# Patient Record
Sex: Male | Born: 1950
Health system: Southern US, Community
[De-identification: ages and names within clinical notes are randomized; demographics above are authoritative.]

## PROBLEM LIST (undated history)

## (undated) DIAGNOSIS — Z87442 Personal history of urinary calculi: Secondary | ICD-10-CM

## (undated) DIAGNOSIS — M1711 Unilateral primary osteoarthritis, right knee: Secondary | ICD-10-CM

## (undated) DIAGNOSIS — M1990 Unspecified osteoarthritis, unspecified site: Secondary | ICD-10-CM

## (undated) DIAGNOSIS — D539 Nutritional anemia, unspecified: Secondary | ICD-10-CM

## (undated) DIAGNOSIS — G250 Essential tremor: Secondary | ICD-10-CM

## (undated) DIAGNOSIS — Z87440 Personal history of urinary (tract) infections: Secondary | ICD-10-CM

## (undated) DIAGNOSIS — N393 Stress incontinence (female) (male): Secondary | ICD-10-CM

## (undated) DIAGNOSIS — E785 Hyperlipidemia, unspecified: Secondary | ICD-10-CM

## (undated) DIAGNOSIS — M12811 Other specific arthropathies, not elsewhere classified, right shoulder: Secondary | ICD-10-CM

## (undated) DIAGNOSIS — I639 Cerebral infarction, unspecified: Secondary | ICD-10-CM

## (undated) DIAGNOSIS — Z9289 Personal history of other medical treatment: Secondary | ICD-10-CM

## (undated) DIAGNOSIS — E538 Deficiency of other specified B group vitamins: Secondary | ICD-10-CM

## (undated) DIAGNOSIS — R0789 Other chest pain: Secondary | ICD-10-CM

## (undated) DIAGNOSIS — D472 Monoclonal gammopathy: Secondary | ICD-10-CM

## (undated) DIAGNOSIS — C61 Malignant neoplasm of prostate: Secondary | ICD-10-CM

## (undated) DIAGNOSIS — R55 Syncope and collapse: Secondary | ICD-10-CM

## (undated) DIAGNOSIS — A419 Sepsis, unspecified organism: Secondary | ICD-10-CM

## (undated) DIAGNOSIS — R7302 Impaired glucose tolerance (oral): Secondary | ICD-10-CM

## (undated) DIAGNOSIS — K5790 Diverticulosis of intestine, part unspecified, without perforation or abscess without bleeding: Secondary | ICD-10-CM

## (undated) DIAGNOSIS — N179 Acute kidney failure, unspecified: Secondary | ICD-10-CM

## (undated) DIAGNOSIS — F329 Major depressive disorder, single episode, unspecified: Secondary | ICD-10-CM

## (undated) DIAGNOSIS — M75101 Unspecified rotator cuff tear or rupture of right shoulder, not specified as traumatic: Secondary | ICD-10-CM

## (undated) DIAGNOSIS — I10 Essential (primary) hypertension: Secondary | ICD-10-CM

## (undated) DIAGNOSIS — K4090 Unilateral inguinal hernia, without obstruction or gangrene, not specified as recurrent: Secondary | ICD-10-CM

## (undated) DIAGNOSIS — I619 Nontraumatic intracerebral hemorrhage, unspecified: Principal | ICD-10-CM

## (undated) DIAGNOSIS — K219 Gastro-esophageal reflux disease without esophagitis: Secondary | ICD-10-CM

## (undated) HISTORY — DX: Syncope and collapse: R55

## (undated) HISTORY — DX: Nutritional anemia, unspecified: D53.9

## (undated) HISTORY — DX: Cerebral infarction, unspecified: I63.9

## (undated) HISTORY — DX: Unilateral primary osteoarthritis, right knee: M17.11

## (undated) HISTORY — DX: Malignant neoplasm of prostate: C61

## (undated) HISTORY — DX: Hyperlipidemia, unspecified: E78.5

## (undated) HISTORY — PX: COLONOSCOPY: SHX174

## (undated) HISTORY — PX: SPINE SURGERY: SHX786

## (undated) HISTORY — DX: Monoclonal gammopathy: D47.2

## (undated) HISTORY — DX: Gastro-esophageal reflux disease without esophagitis: K21.9

## (undated) HISTORY — DX: Nontraumatic intracerebral hemorrhage, unspecified: I61.9

## (undated) HISTORY — PX: WRIST SURGERY: SHX841

## (undated) HISTORY — DX: Diverticulosis of intestine, part unspecified, without perforation or abscess without bleeding: K57.90

## (undated) HISTORY — DX: Unilateral inguinal hernia, without obstruction or gangrene, not specified as recurrent: K40.90

## (undated) HISTORY — DX: Major depressive disorder, single episode, unspecified: F32.9

## (undated) HISTORY — DX: Impaired glucose tolerance (oral): R73.02

## (undated) HISTORY — DX: Essential tremor: G25.0

## (undated) HISTORY — DX: Unspecified osteoarthritis, unspecified site: M19.90

---

## 1997-07-16 ENCOUNTER — Encounter: Admission: RE | Admit: 1997-07-16 | Discharge: 1997-10-14 | Payer: Self-pay | Admitting: Orthopedic Surgery

## 1999-04-16 ENCOUNTER — Encounter: Payer: Self-pay | Admitting: Internal Medicine

## 1999-04-16 ENCOUNTER — Ambulatory Visit (HOSPITAL_COMMUNITY): Admission: RE | Admit: 1999-04-16 | Discharge: 1999-04-16 | Payer: Self-pay | Admitting: Internal Medicine

## 2000-04-26 ENCOUNTER — Ambulatory Visit (HOSPITAL_COMMUNITY): Admission: RE | Admit: 2000-04-26 | Discharge: 2000-04-26 | Payer: Self-pay | Admitting: *Deleted

## 2000-04-26 ENCOUNTER — Encounter: Payer: Self-pay | Admitting: *Deleted

## 2000-06-23 ENCOUNTER — Encounter: Payer: Self-pay | Admitting: *Deleted

## 2000-06-23 ENCOUNTER — Ambulatory Visit (HOSPITAL_COMMUNITY): Admission: RE | Admit: 2000-06-23 | Discharge: 2000-06-23 | Payer: Self-pay | Admitting: *Deleted

## 2001-02-16 ENCOUNTER — Encounter (HOSPITAL_COMMUNITY): Admission: RE | Admit: 2001-02-16 | Discharge: 2001-05-17 | Payer: Self-pay | Admitting: Orthopedic Surgery

## 2001-02-17 ENCOUNTER — Emergency Department (HOSPITAL_COMMUNITY): Admission: EM | Admit: 2001-02-17 | Discharge: 2001-02-17 | Payer: Self-pay

## 2001-02-18 ENCOUNTER — Emergency Department (HOSPITAL_COMMUNITY): Admission: EM | Admit: 2001-02-18 | Discharge: 2001-02-18 | Payer: Self-pay

## 2001-02-19 ENCOUNTER — Ambulatory Visit (HOSPITAL_COMMUNITY): Admission: RE | Admit: 2001-02-19 | Discharge: 2001-02-19 | Payer: Self-pay | Admitting: Orthopedic Surgery

## 2001-08-13 ENCOUNTER — Ambulatory Visit (HOSPITAL_COMMUNITY): Admission: RE | Admit: 2001-08-13 | Discharge: 2001-08-14 | Payer: Self-pay | Admitting: Neurosurgery

## 2001-08-13 ENCOUNTER — Encounter: Payer: Self-pay | Admitting: Neurosurgery

## 2001-09-25 ENCOUNTER — Encounter: Admission: RE | Admit: 2001-09-25 | Discharge: 2001-09-25 | Payer: Self-pay | Admitting: Neurosurgery

## 2001-09-25 ENCOUNTER — Encounter: Payer: Self-pay | Admitting: Neurosurgery

## 2002-01-21 ENCOUNTER — Ambulatory Visit (HOSPITAL_COMMUNITY): Admission: RE | Admit: 2002-01-21 | Discharge: 2002-01-21 | Payer: Self-pay | Admitting: Neurosurgery

## 2002-01-21 ENCOUNTER — Encounter: Payer: Self-pay | Admitting: Neurosurgery

## 2002-06-26 ENCOUNTER — Encounter: Payer: Self-pay | Admitting: Gastroenterology

## 2002-06-26 ENCOUNTER — Ambulatory Visit (HOSPITAL_COMMUNITY): Admission: RE | Admit: 2002-06-26 | Discharge: 2002-06-26 | Payer: Self-pay | Admitting: Gastroenterology

## 2002-07-18 ENCOUNTER — Encounter: Admission: RE | Admit: 2002-07-18 | Discharge: 2002-07-18 | Payer: Self-pay | Admitting: Neurosurgery

## 2002-07-18 ENCOUNTER — Encounter: Payer: Self-pay | Admitting: Neurosurgery

## 2002-08-13 ENCOUNTER — Ambulatory Visit (HOSPITAL_COMMUNITY): Admission: RE | Admit: 2002-08-13 | Discharge: 2002-08-13 | Payer: Self-pay | Admitting: Neurosurgery

## 2002-08-13 ENCOUNTER — Encounter: Payer: Self-pay | Admitting: Neurosurgery

## 2005-06-07 ENCOUNTER — Ambulatory Visit: Payer: Self-pay | Admitting: Internal Medicine

## 2005-07-05 ENCOUNTER — Ambulatory Visit: Payer: Self-pay | Admitting: Gastroenterology

## 2005-07-20 ENCOUNTER — Ambulatory Visit: Payer: Self-pay | Admitting: Gastroenterology

## 2006-03-29 ENCOUNTER — Ambulatory Visit: Payer: Self-pay | Admitting: Internal Medicine

## 2006-05-11 ENCOUNTER — Ambulatory Visit: Payer: Self-pay | Admitting: Internal Medicine

## 2006-05-18 ENCOUNTER — Encounter: Admission: RE | Admit: 2006-05-18 | Discharge: 2006-05-18 | Payer: Self-pay | Admitting: Internal Medicine

## 2008-08-26 ENCOUNTER — Ambulatory Visit: Payer: Self-pay | Admitting: Internal Medicine

## 2008-08-26 DIAGNOSIS — E739 Lactose intolerance, unspecified: Secondary | ICD-10-CM | POA: Insufficient documentation

## 2008-08-26 DIAGNOSIS — M545 Low back pain, unspecified: Secondary | ICD-10-CM | POA: Insufficient documentation

## 2008-08-26 DIAGNOSIS — K5730 Diverticulosis of large intestine without perforation or abscess without bleeding: Secondary | ICD-10-CM | POA: Insufficient documentation

## 2008-08-26 DIAGNOSIS — F528 Other sexual dysfunction not due to a substance or known physiological condition: Secondary | ICD-10-CM

## 2008-08-26 DIAGNOSIS — R21 Rash and other nonspecific skin eruption: Secondary | ICD-10-CM

## 2008-08-26 DIAGNOSIS — E785 Hyperlipidemia, unspecified: Secondary | ICD-10-CM | POA: Insufficient documentation

## 2008-08-26 DIAGNOSIS — K219 Gastro-esophageal reflux disease without esophagitis: Secondary | ICD-10-CM

## 2008-08-26 DIAGNOSIS — J309 Allergic rhinitis, unspecified: Secondary | ICD-10-CM | POA: Insufficient documentation

## 2008-08-26 DIAGNOSIS — R972 Elevated prostate specific antigen [PSA]: Secondary | ICD-10-CM | POA: Insufficient documentation

## 2008-09-03 ENCOUNTER — Ambulatory Visit: Payer: Self-pay | Admitting: Internal Medicine

## 2008-09-05 ENCOUNTER — Encounter: Payer: Self-pay | Admitting: Internal Medicine

## 2008-09-05 LAB — CONVERTED CEMR LAB: Hgb A1c MFr Bld: 6 % (ref 4.6–6.5)

## 2008-09-09 ENCOUNTER — Encounter: Payer: Self-pay | Admitting: Internal Medicine

## 2008-09-09 LAB — CONVERTED CEMR LAB
ALT: 33 units/L (ref 0–53)
AST: 24 units/L (ref 0–37)
Albumin: 4.4 g/dL (ref 3.5–5.2)
Alkaline Phosphatase: 52 units/L (ref 39–117)
BUN: 13 mg/dL (ref 6–23)
Basophils Absolute: 0 10*3/uL (ref 0.0–0.1)
Basophils Relative: 0.2 % (ref 0.0–3.0)
Bilirubin Urine: NEGATIVE
Bilirubin, Direct: 0.2 mg/dL (ref 0.0–0.3)
CO2: 32 meq/L (ref 19–32)
Calcium: 9.5 mg/dL (ref 8.4–10.5)
Chloride: 108 meq/L (ref 96–112)
Cholesterol: 224 mg/dL — ABNORMAL HIGH (ref 0–200)
Creatinine, Ser: 1.1 mg/dL (ref 0.4–1.5)
Direct LDL: 154.4 mg/dL
Eosinophils Absolute: 0 10*3/uL (ref 0.0–0.7)
Eosinophils Relative: 0.5 % (ref 0.0–5.0)
GFR calc non Af Amer: 88.19 mL/min (ref >60)
Glucose, Bld: 150 mg/dL — ABNORMAL HIGH (ref 70–99)
HCT: 43 % (ref 39.0–52.0)
HDL: 68.7 mg/dL (ref >39.00)
Hemoglobin, Urine: NEGATIVE
Hemoglobin: 15.1 g/dL (ref 13.0–17.0)
Ketones, ur: NEGATIVE mg/dL
Leukocytes, UA: NEGATIVE
Lymphocytes Relative: 19 % (ref 12.0–46.0)
Lymphs Abs: 1.5 10*3/uL (ref 0.7–4.0)
MCHC: 35.1 g/dL (ref 30.0–36.0)
MCV: 101.7 fL — ABNORMAL HIGH (ref 78.0–100.0)
Monocytes Absolute: 0.4 10*3/uL (ref 0.1–1.0)
Monocytes Relative: 5.4 % (ref 3.0–12.0)
Neutro Abs: 5.8 10*3/uL (ref 1.4–7.7)
Neutrophils Relative %: 74.9 % (ref 43.0–77.0)
Nitrite: NEGATIVE
PSA: 6.12 ng/mL — ABNORMAL HIGH (ref 0.10–4.00)
Platelets: 315 10*3/uL (ref 150.0–400.0)
Potassium: 3.9 meq/L (ref 3.5–5.1)
RBC: 4.23 M/uL (ref 4.22–5.81)
RDW: 12.6 % (ref 11.5–14.6)
Sodium: 143 meq/L (ref 135–145)
Specific Gravity, Urine: 1.025 (ref 1.000–1.030)
TSH: 1.45 microintl units/mL (ref 0.35–5.50)
Total Bilirubin: 1 mg/dL (ref 0.3–1.2)
Total CHOL/HDL Ratio: 3
Total Protein: 8 g/dL (ref 6.0–8.3)
Triglycerides: 67 mg/dL (ref 0.0–149.0)
Urine Glucose: NEGATIVE mg/dL
Urobilinogen, UA: 0.2 (ref 0.0–1.0)
VLDL: 13.4 mg/dL (ref 0.0–40.0)
WBC: 7.7 10*3/uL (ref 4.5–10.5)
pH: 6 (ref 5.0–8.0)

## 2008-10-07 ENCOUNTER — Telehealth: Payer: Self-pay | Admitting: Internal Medicine

## 2008-10-07 ENCOUNTER — Ambulatory Visit: Payer: Self-pay | Admitting: Gastroenterology

## 2008-10-21 ENCOUNTER — Ambulatory Visit: Payer: Self-pay | Admitting: Gastroenterology

## 2008-11-21 ENCOUNTER — Telehealth: Payer: Self-pay | Admitting: Internal Medicine

## 2008-12-29 ENCOUNTER — Ambulatory Visit: Payer: Self-pay | Admitting: Internal Medicine

## 2008-12-29 DIAGNOSIS — M25512 Pain in left shoulder: Secondary | ICD-10-CM

## 2009-01-05 ENCOUNTER — Encounter: Payer: Self-pay | Admitting: Internal Medicine

## 2009-04-11 DIAGNOSIS — C61 Malignant neoplasm of prostate: Secondary | ICD-10-CM

## 2009-04-11 HISTORY — DX: Malignant neoplasm of prostate: C61

## 2009-10-23 ENCOUNTER — Telehealth: Payer: Self-pay | Admitting: Internal Medicine

## 2009-11-09 ENCOUNTER — Ambulatory Visit: Payer: Self-pay | Admitting: Internal Medicine

## 2009-11-09 LAB — CONVERTED CEMR LAB
ALT: 29 units/L (ref 0–53)
AST: 25 units/L (ref 0–37)
Alkaline Phosphatase: 48 units/L (ref 39–117)
BUN: 16 mg/dL (ref 6–23)
Bilirubin Urine: NEGATIVE
Calcium: 9.2 mg/dL (ref 8.4–10.5)
Eosinophils Absolute: 0.2 10*3/uL (ref 0.0–0.7)
Eosinophils Relative: 3.3 % (ref 0.0–5.0)
GFR calc non Af Amer: 81.8 mL/min (ref >60)
Glucose, Bld: 108 mg/dL — ABNORMAL HIGH (ref 70–99)
HCT: 42.5 % (ref 39.0–52.0)
Ketones, ur: NEGATIVE mg/dL
Lymphs Abs: 2.3 10*3/uL (ref 0.7–4.0)
MCHC: 35.5 g/dL (ref 30.0–36.0)
MCV: 101.7 fL — ABNORMAL HIGH (ref 78.0–100.0)
Monocytes Absolute: 0.5 10*3/uL (ref 0.1–1.0)
Neutrophils Relative %: 59.3 % (ref 43.0–77.0)
Platelets: 281 10*3/uL (ref 150.0–400.0)
Potassium: 4.3 meq/L (ref 3.5–5.1)
Sodium: 140 meq/L (ref 135–145)
TSH: 2.21 microintl units/mL (ref 0.35–5.50)
Total Bilirubin: 1.4 mg/dL — ABNORMAL HIGH (ref 0.3–1.2)
Total CHOL/HDL Ratio: 3
Total Protein, Urine: NEGATIVE mg/dL
Triglycerides: 60 mg/dL (ref 0.0–149.0)
WBC: 7.5 10*3/uL (ref 4.5–10.5)
pH: 6 (ref 5.0–8.0)

## 2009-11-12 ENCOUNTER — Encounter: Payer: Self-pay | Admitting: Internal Medicine

## 2009-11-12 ENCOUNTER — Ambulatory Visit: Payer: Self-pay | Admitting: Internal Medicine

## 2010-01-19 ENCOUNTER — Encounter: Payer: Self-pay | Admitting: Internal Medicine

## 2010-02-10 ENCOUNTER — Encounter: Payer: Self-pay | Admitting: Internal Medicine

## 2010-02-24 ENCOUNTER — Encounter: Payer: Self-pay | Admitting: Internal Medicine

## 2010-03-10 ENCOUNTER — Encounter: Payer: Self-pay | Admitting: Internal Medicine

## 2010-03-17 ENCOUNTER — Encounter: Payer: Self-pay | Admitting: Internal Medicine

## 2010-03-30 ENCOUNTER — Encounter: Payer: Self-pay | Admitting: Internal Medicine

## 2010-04-11 DIAGNOSIS — R55 Syncope and collapse: Secondary | ICD-10-CM

## 2010-04-11 HISTORY — DX: Syncope and collapse: R55

## 2010-04-11 HISTORY — PX: ROBOT ASSISTED LAPAROSCOPIC RADICAL PROSTATECTOMY: SHX5141

## 2010-04-26 ENCOUNTER — Inpatient Hospital Stay (HOSPITAL_COMMUNITY)
Admission: RE | Admit: 2010-04-26 | Discharge: 2010-04-27 | Payer: Self-pay | Source: Home / Self Care | Attending: Urology | Admitting: Urology

## 2010-04-26 ENCOUNTER — Encounter (INDEPENDENT_AMBULATORY_CARE_PROVIDER_SITE_OTHER): Payer: Self-pay | Admitting: Urology

## 2010-04-26 LAB — CBC
HCT: 41.9 % (ref 39.0–52.0)
Hemoglobin: 14.1 g/dL (ref 13.0–17.0)
MCH: 34.5 pg — ABNORMAL HIGH (ref 26.0–34.0)
MCHC: 33.7 g/dL (ref 30.0–36.0)
MCV: 102.4 fL — ABNORMAL HIGH (ref 78.0–100.0)
Platelets: 328 10*3/uL (ref 150–400)
RBC: 4.09 MIL/uL — ABNORMAL LOW (ref 4.22–5.81)
RDW: 12.4 % (ref 11.5–15.5)
WBC: 6.9 10*3/uL (ref 4.0–10.5)

## 2010-04-26 LAB — BASIC METABOLIC PANEL
BUN: 13 mg/dL (ref 6–23)
CO2: 30 mEq/L (ref 19–32)
Calcium: 9.6 mg/dL (ref 8.4–10.5)
Chloride: 107 mEq/L (ref 96–112)
Creatinine, Ser: 1.15 mg/dL (ref 0.4–1.5)
GFR calc Af Amer: >60 mL/min (ref >60)
GFR calc non Af Amer: >60 mL/min (ref >60)
Glucose, Bld: 99 mg/dL (ref 70–99)
Potassium: 3.9 mEq/L (ref 3.5–5.1)
Sodium: 144 mEq/L (ref 135–145)

## 2010-04-26 LAB — MRSA CULTURE

## 2010-04-26 LAB — SURGICAL PCR SCREEN
MRSA, PCR: INVALID — AB
Staphylococcus aureus: INVALID — AB

## 2010-04-28 LAB — HEMOGLOBIN AND HEMATOCRIT, BLOOD
HCT: 38.5 % — ABNORMAL LOW (ref 39.0–52.0)
HCT: 36.4 % — ABNORMAL LOW (ref 39.0–52.0)
HCT: 37.3 % — ABNORMAL LOW (ref 39.0–52.0)
Hemoglobin: 13.2 g/dL (ref 13.0–17.0)
Hemoglobin: 12.3 g/dL — ABNORMAL LOW (ref 13.0–17.0)
Hemoglobin: 12.9 g/dL — ABNORMAL LOW (ref 13.0–17.0)

## 2010-04-28 LAB — ABO/RH: ABO/RH(D): O POS

## 2010-04-28 LAB — TYPE AND SCREEN
ABO/RH(D): O POS
Antibody Screen: NEGATIVE

## 2010-04-28 LAB — CREATININE, FLUID (PLEURAL, PERITONEAL, JP DRAINAGE): Creat, Fluid: 0.9 mg/dL

## 2010-05-02 ENCOUNTER — Emergency Department (HOSPITAL_COMMUNITY)
Admission: EM | Admit: 2010-05-02 | Discharge: 2010-05-02 | Payer: Self-pay | Source: Home / Self Care | Admitting: Emergency Medicine

## 2010-05-02 ENCOUNTER — Encounter: Payer: Self-pay | Admitting: Internal Medicine

## 2010-05-02 NOTE — Op Note (Signed)
NAME:  Nathan Wallace, Nathan Wallace NO.:  [id number]  MEDICAL RECORD NO.:  [id number]          PATIENT TYPE:  INP  LOCATION:  0006                         FACILITY:  [facility]  PHYSICIAN:  Heloise Purpura, MD      DATE OF BIRTH:  12/02/1950  DATE OF PROCEDURE:  04/26/2010 DATE OF DISCHARGE:                              OPERATIVE REPORT   PREOPERATIVE DIAGNOSIS:  Clinically localized adenocarcinoma of the prostate (clinical stage T1c Nx Mx).  POSTOPERATIVE DIAGNOSIS:  Clinically localized adenocarcinoma of the prostate (clinical stage T1c Nx Mx).  PROCEDURE:  Robotic-assisted laparoscopic radical prostatectomy (bilateral nerve-sparing).  SURGEON:  Heloise Purpura, MD  ASSISTANT:  Delia Chimes, [facility]  ANESTHESIA:  General.  COMPLICATIONS:  None.  ESTIMATED BLOOD LOSS:  75 cc  SPECIMENS:  Prostate and seminal vesicles.  DISPOSITION:  Specimens to Pathology.  DRAINS: 1. A 20-French Coude catheter. 2. #19 Blake pelvic drain. INDICATION:  Nathan Wallace is a 60 year old gentleman who was diagnosed with clinically localized prostate cancer.  After discussion regarding management options for treatment, he elected to proceed with surgical therapy and the above procedures.  Potential risks, complications, and alternative treatment options were discussed in detail, and informed consent was obtained.  DESCRIPTION OF PROCEDURE:  The patient was taken to the operating room, and a general anesthetic was administered.  He was given preoperative antibiotics, placed in the dorsal lithotomy position, and prepped and draped in the usual sterile fashion.  Next, a preoperative time-out was performed.  A site was then selected just superior to the umbilicus for placement of the camera port.  This was placed using a standard open Hassan technique which allowed entry in the peritoneal cavity under direct vision without difficulty.  A 12 mm port was then placed, and a pneumoperitoneum  established.  With a 0-degree lens, the abdomen was inspected, and there was no evidence of any intra-abdominal injuries or other abnormalities.  The remaining ports were then placed with 8 mm robotic ports placed in the left lower quadrant, far left lower quadrant, and right lower quadrant.  An 5 mm port was placed in the right upper quadrant, and a 12 mm port was placed in the far right lateral abdominal wall for laparoscopic assistance.  All ports were placed under direct vision and without any difficulty.  The surgical cart was then docked.  With the aid of the cautery scissors, the bladder was reflected posteriorly allowing entry into space of Retzius and identification of the endopelvic fascia and prostate.  The endopelvic fascia was then incised from the apex back to the base of the prostate bilaterally, and the underlying levator muscle fibers were split laterally off the prostate thereby isolating the dorsal venous complex. The dorsal vein was then stapled and divided with a 45 mm Flex Echelon Stapler.  The bladder neck was identified with the aid of Foley catheter manipulation and was divided anteriorly thereby exposing the Foley catheter.  The catheter balloon was deflated, and the catheter was brought into the operative field and used to retract the prostate anteriorly.  The posterior bladder neck was then divided, and dissection proceeded between the  prostate and bladder until the vasa deferentia and seminal vesicles were identified.  The vasa deferentia were isolated, divided and lifted anteriorly.  The seminal vesicles were dissected down to their tips with care to control the seminal vesicle arterial blood supply, and these structures were then also lifted anteriorly.  The space between Denonvilliers fascia and anterior rectum was then developed with combination of sharp and blunt dissection.  The lateral prostatic fascia was then sharply incised allowing the  neurovascular bundles to be released bilaterally, and the vascular pedicles of the prostate were ligated with Hem-o-Lok clips above the level of the neurovascular bundles and divided with sharp cold scissor dissection. The neurovascular bundles were then swept off the apex of the prostate and urethra, and the urethra was sharply transected allowing the prostate specimen to be disarticulated.  The pelvis was then copiously irrigated and there were noted to be some venous oozing sites, both along the neurovascular bundles and the dorsal vein.  Therefore, 3-0 Vicryl figure-of-eight sutures were used to oversew these areas resulting in excellent hemostasis.  Attention then turned to the urethral anastomosis.  A 2-0 Vicryl slip-knot was placed between Denonvilliers fascia, the posterior bladder neck, and the posterior urethra to reapproximate these structures.  A double-armed 3-0 Monocryl suture was then used to perform a 360 degree running tension-free anastomosis between the bladder neck and urethra.  A new 20-French Coude catheter was then inserted into the bladder and irrigated.  There were no blood clots within the bladder and the anastomosis appeared to be watertight.  A #19 Blake drain was brought through the left robotic port and positioned appropriately in the pelvis.  It was secured to the skin with a nylon suture.  The surgical cart was then undocked.  The right lateral 12-mm port site was closed with a 0 Vicryl suture placed laparoscopically.  All remaining ports were then removed under direct vision, and the prostate specimen was removed intact within the Endopouch retrieval bag via the periumbilical port site.  This fascial opening was then closed with two running 0 Vicryl sutures.  All port sites were injected with 0.25% Marcaine and reapproximated at the skin level with staples.  Sterile dressings were applied.  The patient appeared to tolerate the procedure well and without  complications.  He was able to be extubated and transferred to the recovery unit in satisfactory condition.     Heloise Purpura, MD     LB/MEDQ  D:  04/26/2010  T:  04/26/2010  Job:  [id number]  Electronically Signed by Heloise Purpura MD on 05/02/2010 05:22:16 PM

## 2010-05-04 ENCOUNTER — Encounter: Payer: Self-pay | Admitting: Internal Medicine

## 2010-05-04 LAB — URINALYSIS, ROUTINE W REFLEX MICROSCOPIC
Bilirubin Urine: NEGATIVE
Nitrite: NEGATIVE
Protein, ur: >300 mg/dL — AB
Specific Gravity, Urine: 1.028 (ref 1.005–1.030)
Urine Glucose, Fasting: NEGATIVE mg/dL
Urobilinogen, UA: 1 mg/dL (ref 0.0–1.0)
pH: 7 (ref 5.0–8.0)

## 2010-05-04 LAB — COMPREHENSIVE METABOLIC PANEL
ALT: 16 U/L (ref 0–53)
AST: 18 U/L (ref 0–37)
Albumin: 3.4 g/dL — ABNORMAL LOW (ref 3.5–5.2)
Alkaline Phosphatase: 47 U/L (ref 39–117)
BUN: 6 mg/dL (ref 6–23)
CO2: 27 mEq/L (ref 19–32)
Calcium: 9 mg/dL (ref 8.4–10.5)
Chloride: 105 mEq/L (ref 96–112)
Creatinine, Ser: 1.12 mg/dL (ref 0.4–1.5)
GFR calc Af Amer: >60 mL/min (ref >60)
GFR calc non Af Amer: >60 mL/min (ref >60)
Glucose, Bld: 105 mg/dL — ABNORMAL HIGH (ref 70–99)
Potassium: 3.9 mEq/L (ref 3.5–5.1)
Sodium: 141 mEq/L (ref 135–145)
Total Bilirubin: 1.2 mg/dL (ref 0.3–1.2)
Total Protein: 7.2 g/dL (ref 6.0–8.3)

## 2010-05-04 LAB — DIFFERENTIAL
Basophils Absolute: 0 10*3/uL (ref 0.0–0.1)
Basophils Relative: 0 % (ref 0–1)
Eosinophils Absolute: 0.3 10*3/uL (ref 0.0–0.7)
Eosinophils Relative: 4 % (ref 0–5)
Lymphocytes Relative: 20 % (ref 12–46)
Lymphs Abs: 1.9 10*3/uL (ref 0.7–4.0)
Monocytes Absolute: 1 10*3/uL (ref 0.1–1.0)
Monocytes Relative: 10 % (ref 3–12)
Neutro Abs: 6.2 10*3/uL (ref 1.7–7.7)
Neutrophils Relative %: 66 % (ref 43–77)

## 2010-05-04 LAB — CBC
HCT: 33.9 % — ABNORMAL LOW (ref 39.0–52.0)
Hemoglobin: 11.8 g/dL — ABNORMAL LOW (ref 13.0–17.0)
MCH: 34.4 pg — ABNORMAL HIGH (ref 26.0–34.0)
MCHC: 34.8 g/dL (ref 30.0–36.0)
MCV: 98.8 fL (ref 78.0–100.0)
Platelets: 322 10*3/uL (ref 150–400)
RBC: 3.43 MIL/uL — ABNORMAL LOW (ref 4.22–5.81)
RDW: 12.1 % (ref 11.5–15.5)
WBC: 9.5 10*3/uL (ref 4.0–10.5)

## 2010-05-04 LAB — URINE MICROSCOPIC-ADD ON

## 2010-05-05 LAB — URINE CULTURE
Colony Count: >=100000
Culture  Setup Time: 201201221736

## 2010-05-11 NOTE — Consult Note (Signed)
[facility]   Imported By: Sherian Rein 02/17/2010 11:16:52  _____________________________________________________________________  External Attachment:    Type:   Image     Comment:   External Document

## 2010-05-11 NOTE — Consult Note (Signed)
Summary: [facility]   Imported By: Lester Buckland 03/02/2010 08:34:15  _____________________________________________________________________  External Attachment:    Type:   Image     Comment:   External Document

## 2010-05-11 NOTE — Consult Note (Signed)
Summary: [facility]   Imported By: Sherian Rein 01/22/2010 13:33:53  _____________________________________________________________________  External Attachment:    Type:   Image     Comment:   External Document

## 2010-05-11 NOTE — Consult Note (Signed)
Summary: [facility]   Imported By: Sherian Rein 03/15/2010 10:56:26  _____________________________________________________________________  External Attachment:    Type:   Image     Comment:   External Document

## 2010-05-11 NOTE — Progress Notes (Signed)
  Phone Note Call from Patient Call back at Home Phone [phone]   Caller: Patient Summary of Call: Patient called [facility] needing to clarifiy what Tramadol is used for. Per EMR, medication was given for Shoulder pain. Patient notified  Initial call taken by: Rock Nephew CMA,  October 23, 2009 10:55 AM

## 2010-05-11 NOTE — Assessment & Plan Note (Signed)
Summary: CPX/NWS  #   Vital Signs:  Patient profile:   60 year old male Height:      71 inches Weight:      162.25 pounds BMI:     22.71 O2 Sat:      97 % on Room air Temp:     98.1 degrees F oral Pulse rate:   67 / minute BP sitting:   122 / 80  (left arm) Cuff size:   regular  Vitals Entered By: Zella Ball Ewing CMA Duncan Dull) (November 12, 2009 10:56 AM)  O2 Flow:  Room air  CC: Adult Physical/RE   CC:  Adult Physical/RE.  History of Present Illness: never made his appt with urology last year;  overall doing well,  Pt denies CP, sob, doe, wheezing, orthopnea, pnd, worsening LE edema, palps, dizziness or syncope  Pt denies new neuro symptoms such as headache, facial or extremity weakness  No fever, wt loss, night sweats, loss of appetite or other constitutional symptoms   Problems Prior to Update: 1)  Shoulder Pain, Right  (ICD-719.41) 2)  Psa, Increased  (ICD-790.93) 3)  Low Back Pain  (ICD-724.2) 4)  Diverticulosis, Colon  (ICD-562.10) 5)  Gerd  (ICD-530.81) 6)  Psa, Increased  (ICD-790.93) 7)  Glucose Intolerance  (ICD-271.3) 8)  Allergic Rhinitis  (ICD-477.9) 9)  Preventive Health Care  (ICD-V70.0) 10)  Hyperlipidemia  (ICD-272.4) 11)  Erectile Dysfunction  (ICD-302.72) 12)  Neoplasm, Malignant, Colon, Family Hx, Father  (ICD-V16.0) 79)  Rash-nonvesicular  (ICD-782.1)  Medications Prior to Update: 1)  Cialis 20 Mg Tabs (Tadalafil) .Marland Kitchen.. 1 By Mouth Every Other Day As Needed 2)  Adult Aspirin Ec Low Strength 81 Mg Tbec (Aspirin) .Marland Kitchen.. 1 By Mouth Once Daily 3)  Prednisone 10 Mg Tabs (Prednisone) .... 3po Qd For 3days, Then 2po Qd For 3days, Then 1po Qd For 3days, Then Stop 4)  Triamcinolone Acetonide 0.5 % Crea (Triamcinolone Acetonide) .... Use Asd Two Times A Day As Needed 5)  Simvastatin 40 Mg Tabs (Simvastatin) .Marland Kitchen.. 1 By Mouth Once Daily 6)  Tramadol Hcl 50 Mg Tabs (Tramadol Hcl) .Marland Kitchen.. 1 - 2 By Mouth Q 6 Hrs As Needed  Current Medications (verified): 1)  Cialis 20 Mg Tabs  (Tadalafil) .Marland Kitchen.. 1 By Mouth Every Other Day As Needed 2)  Adult Aspirin Ec Low Strength 81 Mg Tbec (Aspirin) .Marland Kitchen.. 1 By Mouth Once Daily 3)  Triamcinolone Acetonide 0.5 % Crea (Triamcinolone Acetonide) .... Use Asd Two Times A Day As Needed 4)  Simvastatin 40 Mg Tabs (Simvastatin) .Marland Kitchen.. 1 By Mouth Once Daily 5)  Lipitor 20 Mg Tabs (Atorvastatin Calcium) .Marland Kitchen.. 1po Once Daily  Allergies (verified): No Known Drug Allergies  Past History:  Past Medical History: Last updated: 08/26/2008 c-spine disc disease LS - spine disc disease E.D. 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 [place] Former Smoker - cigar and pipe only Alcohol use-yes - 24 beers per wk disabled - neck/back/wrist  Risk Factors: Smoking Status: quit (08/26/2008)  Review of Systems  The patient denies anorexia, fever, weight  loss, weight gain, vision loss, decreased hearing, hoarseness, chest pain, syncope, dyspnea on exertion, peripheral edema, prolonged cough, headaches, hemoptysis, abdominal pain, melena, hematochezia, severe indigestion/heartburn, hematuria, muscle weakness, suspicious skin lesions, transient blindness, difficulty walking, depression, unusual weight change, abnormal bleeding, enlarged lymph nodes, and angioedema.         all otherwise negative per pt -    Physical Exam  General:  alert and well-developed.   Head:  normocephalic and atraumatic.   Eyes:  vision grossly intact, pupils equal, and pupils round.   Ears:  R  ear normal and L ear normal.   Nose:  no external deformity and no nasal discharge.   Mouth:  no gingival abnormalities and pharynx pink and moist.   Neck:  supple and no masses.   Lungs:  normal respiratory effort and normal breath sounds.   Heart:  normal rate and regular rhythm.   Abdomen:  soft, non-tender, and normal bowel sounds.   Msk:  no joint tenderness and no joint swelling.   Extremities:  no edema, no erythema  Neurologic:  cranial nerves II-XII intact and strength normal in all extremities.   Skin:  color normal and no rashes.   Psych:  not anxious appearing and not depressed appearing.     Impression & Recommendations:  Problem # 1:  Preventive Health Care (ICD-V70.0) Overall doing well, age appropriate education and counseling updated and referral for appropriate preventive services done unless declined, immunizations up to date or declined, diet counseling done if overweight, urged to quit smoking if smokes , most recent labs reviewed and current ordered if appropriate, ecg reviewed or declined (interpretation per ECG scanned in the EMR if done); information regarding Medicare Prevention requirements given if appropriate; speciality referrals updated as appropriate  Orders: EKG w/ Interpretation (93000)  Problem # 2:  PSA, INCREASED (ICD-790.93)  pt agrees to see urology - will refer and give pt copy of all records  Orders: Urology Referral (Urology)  Problem # 3:  HYPERLIPIDEMIA (ICD-272.4)  His updated medication list for this problem includes:    Simvastatin 40 Mg Tabs (Simvastatin) .Marland Kitchen... 1 by mouth once daily    Lipitor 20 Mg Tabs (Atorvastatin calcium) .Marland Kitchen... 1po once daily ok to consider change to lipitor in dec when generic, and stop the simvastatin, for better LDL control  Labs Reviewed: SGOT: 25 (11/09/2009)   SGPT: 29 (11/09/2009)   HDL:66.50 (11/09/2009), 68.70 (09/03/2008)  LDL:113 (11/09/2009)  Chol:191 (11/09/2009), 224 (09/03/2008)  Trig:60.0  (11/09/2009), 67.0 (09/03/2008)  Complete Medication List: 1)  Cialis 20 Mg Tabs (Tadalafil) .Marland Kitchen.. 1 by mouth every other day as needed 2)  Adult Aspirin Ec Low Strength 81 Mg Tbec (Aspirin) .Marland Kitchen.. 1 by mouth once daily 3)  Triamcinolone Acetonide 0.5 % Crea (Triamcinolone acetonide) .... Use asd two times a day as needed 4)  Simvastatin 40 Mg Tabs (Simvastatin) .Marland Kitchen.. 1 by mouth once daily 5)  Lipitor 20 Mg Tabs (Atorvastatin calcium) .Marland Kitchen.. 1po once daily  Patient Instructions: 1)  You will be contacted about the referral(s) to: urology 2)  Continue all previous medications as before this visit 3)  Please remember to take your blood test results to the urologist 4)  Consider changing the simvastatin to lipitor generic in december 5)  Take an Aspirin every day - 81 mg - 1 per day - COATED only 6)  Please schedule a follow-up appointment in 1 year or sooner if needed Prescriptions: CIALIS 20 MG TABS (TADALAFIL) 1 by mouth every other  day as needed  #10 x 11   Entered and Authorized by:   Corwin Levins MD   Signed by:   Corwin Levins MD on 11/12/2009   Method used:   Electronically to        [facility]  [address] [id number] (retail)       [address]       [place], [place]  [address]       Ph: [phone] or [phone]       Fax: [phone]   RxID:   [id number] TRIAMCINOLONE ACETONIDE 0.5 % CREA (TRIAMCINOLONE ACETONIDE) use asd two times a day as needed  #1 x 1   Entered and Authorized by:   Corwin Levins MD   Signed by:   Corwin Levins MD on 11/12/2009   Method used:   Electronically to        [facility]  [address] [id number] (retail)       [address]       [place], [place]  [address]       Ph: [phone] or [phone]       Fax: [phone]   RxID:   [id number] SIMVASTATIN 40 MG TABS (SIMVASTATIN) 1 by mouth once daily  #90 x 3   Entered and Authorized by:   Corwin Levins MD   Signed by:   Corwin Levins MD on 11/12/2009   Method used:    [facility]ically to        [facility]  [address] [id number] ([facility]       [address]       [place], [place]  [address]       Ph: [phone] or [phone]       Fax: [phone]   RxID:   [id number] LIPITOR 20 MG TABS (ATORVASTATIN CALCIUM) 1po once daily  #90 x 3   Entered and Authorized by:   Corwin Levins MD   Signed by:   Corwin Levins MD on 11/12/2009   Method used:   Print then Give to Patient   RxID:   [id number]

## 2010-05-13 NOTE — Letter (Signed)
[facility]   Imported By: Sherian Rein 04/07/2010 12:21:30  _____________________________________________________________________  External Attachment:    Type:   Image     Comment:   External Document

## 2010-05-13 NOTE — Consult Note (Signed)
Summary: [facility]   Imported By: Lester [place] 03/24/2010 10:00:13  _____________________________________________________________________  External Attachment:    Type:   Image     Comment:   External Document

## 2010-05-19 NOTE — Letter (Signed)
Summary: [facility]   Imported By: Lennie Odor 05/10/2010 14:34:21  _____________________________________________________________________  External Attachment:    Type:   Image     Comment:   External Document

## 2010-08-27 NOTE — Op Note (Signed)
[place]  Patient:    Nathan Wallace, Nathan Wallace Visit Number: [id number] MRN: [id number]          Service Type: DSU Location: [facility] 3172 08 Attending Physician:  Donn Pierini Dictated by:   Julio Sicks, M.D. Proc. Date: 08/13/01 Admit Date:  08/13/2001                             Operative Report  PREOPERATIVE DIAGNOSIS:  C4-5, C5-6 and C6-7 stenosis with myelopathy.  POSTOPERATIVE DIAGNOSIS:  C4-5, C5-6 and C6-7 stenosis with myelopathy.  OPERATION PERFORMED:  C4-5, C5-6 and C6-7 anterior cervical diskectomy and fusion with allograft and anterior plating.  SURGEON:  Julio Sicks, M.D.  ASSISTANT:  Reinaldo Meeker, M.D.  ANESTHESIA:  General endotracheal.  INDICATIONS FOR PROCEDURE:  The patient is a 60 year old male with history of bilateral upper extremity pain, paresthesias and weakness which have been progressively worsening.  Symptoms are consistent with cervical myelopathy. MRI scanning demonstrates severe multiple level spondylosis with marked stenosis at C4-5, C5-6 and C6-7.  At C5-6 there is an associated disk herniation causing a critical stenosis with very severe spinal cord compression.  The patient has been counseled as to his options.  He has decided to proceed with a three-level anterior cervical diskectomy and fusion with allograft anterior plating for hopeful improvement in his symptoms.  DESCRIPTION OF PROCEDURE:  The patient was taken to the operating room and placed on the operating table in supine position.  After an adequate level of anesthesia was achieved, the patient was positioned supine with the neck slightly extended and held in place with halter traction.  The patients anterior cervical region was shaved and prepped sterilely.  A 10 blade was used to make a linear incision overlying the C5-6 level.  This was carried down sharply to the platysma.  The platysma was then divided vertically and dissection proceeded  along the medial border of the sternocleidomastoid muscle and carotid sheath on the right.  The trachea and esophagus were mobilized and retracted towards the left.  Prevertebral fascia was stripped off the anterior spinal column.  The longus colli muscles were then elevated bilaterally.  Deep self-retaining retractor was placed.  Intraoperative fluoroscopy was used and the C4-5, C5-6 and C6-7 levels were confirmed.  Anterior osteophytes were removed using the Leksell rongeur.  Each disk space was then incised with a 15 blade in a rectangular fashion.  A wide disk space clean-out was then achieved using pituitary rongeurs, forward and backward angled Carlens curets, Kerrison rongeurs and a high speed drill.  A very thorough diskectomy was performed down to the level of the posterior annulus.   Microscope was brought into the field and used throughout the remainder of the diskectomy.  Starting first at C4-5, the remaining aspects of annulus and osteophytes were removed down to the level of the posterior longitudinal ligament using the high speed drill. The posterior longitudinal ligament was then elevated and resected in piecemeal fashion using Kerrison rongeurs.  The underlying thecal sac was identified.  A wide central decompression was then performed exposing the underlying thecal sac.  The vertebral bodies of C5 and C4 were undercut using Kerrison rongeurs.  Wide anterior foraminotomies were performed along the outgoing C5 nerve root bilaterally.  Gelfoam was placed topically for hemostasis and then attention then placed to the C5-6 and C6-7 levels.  Both levels were decompressed in a similar manner. There was no  complication at any of the three levels.  7 mm patellar wedge allograft was then trimmed to appropriate length and impacted into place at each level.  Each bone graft was recessed approximately 1 mm.  A 57 mm Atlantis anterior cervical plate was then placed over the C4, C5, C6  and C7 levels.  It was then attached under fluoroscopic guidance by using 15 mm variable angle screws, two each at all four levels.  All eight screws were given a final tightening.  Locking screws were engaged.  Final images revealed good position of the bone grafts with normal alignment of the spine and intact instrumentation.  Retractor system was removed.  Hemostasis was achieved with bipolar electrocautery.  The wound was then closed in typical fashion.  Steri-Strips and sterile dressing were applied.  There were no apparent complications.   The patient tolerated the procedure well and he returned to the recovery room postoperatively. Dictated by:   Julio Sicks, M.D. Attending Physician:  Donn Pierini DD:  08/13/01 TD:  08/13/01 Job: [id number]

## 2010-12-06 ENCOUNTER — Other Ambulatory Visit: Payer: Self-pay | Admitting: Internal Medicine

## 2011-01-13 ENCOUNTER — Other Ambulatory Visit: Payer: Self-pay | Admitting: Internal Medicine

## 2011-01-25 ENCOUNTER — Ambulatory Visit (INDEPENDENT_AMBULATORY_CARE_PROVIDER_SITE_OTHER): Payer: 59 | Admitting: General Surgery

## 2011-02-03 ENCOUNTER — Encounter (INDEPENDENT_AMBULATORY_CARE_PROVIDER_SITE_OTHER): Payer: Self-pay | Admitting: General Surgery

## 2011-02-10 ENCOUNTER — Encounter (INDEPENDENT_AMBULATORY_CARE_PROVIDER_SITE_OTHER): Payer: Self-pay | Admitting: General Surgery

## 2011-02-10 ENCOUNTER — Ambulatory Visit (INDEPENDENT_AMBULATORY_CARE_PROVIDER_SITE_OTHER): Payer: Medicare Other | Admitting: General Surgery

## 2011-02-10 VITALS — BP 136/82 | HR 64 | Temp 97.2°F | Ht 71.5 in | Wt 158.8 lb

## 2011-02-10 DIAGNOSIS — K4090 Unilateral inguinal hernia, without obstruction or gangrene, not specified as recurrent: Secondary | ICD-10-CM

## 2011-02-10 NOTE — Progress Notes (Signed)
Chief Complaint  Patient presents with  . Pre-op Exam    eval LIH    HPI Nathan Wallace is a 60 y.o. male.   HPI  This gentleman was referred to me by Dr. Laymond Purser for evaluation of a left inguinal hernia. Dr. Oliver Barre is his primary care physician.  The patient underwent robotic prostatectomy by Dr. Laverle Patter on April 26, 2010. He has done fairly well from that. He felt a lump in his left groin in August of 2012 and it became painful. It has gotten somewhat bigger but is reducible. He has never had a hernia problem in the past.  He was in Dr. Danielle Dess office for routine followup and saw Valorie Roosevelt.  The left internal hernia was noted and he was referred for a general surgery consultation.  Past Medical History  Diagnosis Date  . Arthritis   . GERD (gastroesophageal reflux disease)   . Prostate cancer   . Hyperlipidemia     border line    Past Surgical History  Procedure Date  . Prostatectomy   . Spine surgery   . Wrist surgery     Family History  Problem Relation Age of Onset  . Hypertension Mother   . Cancer Father     colon/but cancer free as of now 02/10/11    Social History History  Substance Use Topics  . Smoking status: Former Games developer  . Smokeless tobacco: Not on file  . Alcohol Use: Yes     5th a liquor a week    No Known Allergies  Current Outpatient Prescriptions  Medication Sig Dispense Refill  . CIALIS 20 MG tablet TAKE 1 TABLET BY MOUTH EVERY OTHER DAY AS NEEDED  10 tablet  0  . simvastatin (ZOCOR) 40 MG tablet TAKE 1 TABLET BY MOUTH EVERY DAY  90 tablet  0    Review of Systems Review of Systems  Constitutional: Negative for fever, chills and unexpected weight change.  HENT: Negative for hearing loss, congestion, sore throat, trouble swallowing and voice change.   Eyes: Negative for visual disturbance.  Respiratory: Negative for cough and wheezing.   Cardiovascular: Negative for chest pain, palpitations and leg swelling.    Gastrointestinal: Negative for nausea, vomiting, abdominal pain, diarrhea, constipation, blood in stool, abdominal distention, anal bleeding and rectal pain.  Genitourinary: Negative for hematuria and difficulty urinating.       Erectile dysfunction.  Musculoskeletal: Negative for arthralgias.  Skin: Negative for rash and wound.  Neurological: Negative for seizures, syncope, weakness and headaches.  Hematological: Negative for adenopathy. Does not bruise/bleed easily.  Psychiatric/Behavioral: Negative for confusion.    Blood pressure 136/82, pulse 64, temperature 97.2 F (36.2 C), height 5' 11.5" (1.816 m), weight 158 lb 12.8 oz (72.031 kg).  Physical Exam Physical Exam  Constitutional: He is oriented to person, place, and time. He appears well-developed and well-nourished. No distress.  HENT:  Head: Normocephalic.  Nose: Nose normal.  Mouth/Throat: No oropharyngeal exudate.  Eyes: Conjunctivae and EOM are normal. Pupils are equal, round, and reactive to light. Right eye exhibits no discharge. Left eye exhibits no discharge. No scleral icterus.  Neck: Normal range of motion. Neck supple. No JVD present. No tracheal deviation present. No thyromegaly present.  Cardiovascular: Normal rate, regular rhythm, normal heart sounds and intact distal pulses.   No murmur heard. Pulmonary/Chest: Effort normal and breath sounds normal. No stridor. No respiratory distress. He has no wheezes. He has no rales. He exhibits no tenderness.  Abdominal: Soft.  Bowel sounds are normal. He exhibits no distension and no mass. There is no tenderness. There is no rebound and no guarding.    Genitourinary:     Musculoskeletal: Normal range of motion. He exhibits no edema and no tenderness.  Lymphadenopathy:    He has no cervical adenopathy.  Neurological: He is alert and oriented to person, place, and time. He has normal reflexes. Coordination normal.  Skin: Skin is warm and dry. No rash noted. He is not  diaphoretic. No erythema. No pallor.  Psychiatric: He has a normal mood and affect. His behavior is normal. Judgment and thought content normal.    Data Reviewed I reviewed office to Dr. Verlee Rossetti and Dr. Jonny Ruiz.  Assessment    Symptomatic left inguinal hernia, reducible.  Prostate cancer, status post robotic prostatectomy.  Gastroesophageal reflux disease.  Arthritis  Hyperlipidemia.  Last colonoscopy 2010.    Plan    Will schedule for elective repair of his left renal hernia with mesh in the near future at his convenience.  I discussed the indications and details of the surgery with him. Risks and complications have been outlined, including but not limited to bleeding, infection, recurrence of the hernia, nerve damage with chronic pain or numbness, injury to the testicle or bladder or intestine with major reconstructive surgery, cardiac pulmonary and thromboembolic problems. He seems to understand these issues well. At this time all of his questions were answered. He is in full agreement with this plan.       Vesta Wheeland M 02/10/2011, 9:32 AM

## 2011-02-10 NOTE — Patient Instructions (Signed)
You have a left inguinal hernia which has enlarged and is becoming painful. You will be scheduled for surgery to repair thehernia with mesh in the near future at your convenience.  Inguinal Hernia, Adult Muscles help keep everything in the body in its proper place. But if a weak spot in the muscles develops, something can poke through. That is called a hernia. When this happens in the lower part of the belly (abdomen), it is called an inguinal hernia. (It takes its name from a part of the body in this region called the inguinal canal.) A weak spot in the wall of muscles lets some fat or part of the small intestine bulge through. An inguinal hernia can develop at any age. Men get them more often than women. CAUSES  In adults, an inguinal hernia develops over time.  It can be triggered by:   Suddenly straining the muscles of the lower abdomen.   Lifting heavy objects.   Straining to have a bowel movement. Difficult bowel movements (constipation) can lead to this.   Constant coughing. This may be caused by smoking or lung disease.   Being overweight.   Being pregnant.   Working at a job that requires long periods of standing or heavy lifting.   Having had an inguinal hernia before.  One type can be an emergency situation. It is called a strangulated inguinal hernia. It develops if part of the small intestine slips through the weak spot and cannot get back into the abdomen. The blood supply can be cut off. If that happens, part of the intestine may die. This situation requires emergency surgery. SYMPTOMS  Often, a small inguinal hernia has no symptoms. It is found when a healthcare provider does a physical exam. Larger hernias usually have symptoms.   In adults, symptoms may include:   A lump in the groin. This is easier to see when the person is standing. It might disappear when lying down.   In men, a lump in the scrotum.   Pain or burning in the groin. This occurs especially when  lifting, straining or coughing.   A dull ache or feeling of pressure in the groin.   Signs of a strangulated hernia can include:   A bulge in the groin that becomes very painful and tender to the touch.   A bulge that turns red or purple.   Fever, nausea and vomiting.   Inability to have a bowel movement or to pass gas.  DIAGNOSIS  To decide if you have an inguinal hernia, a healthcare provider will probably do a physical examination.  This will include asking questions about any symptoms you have noticed.   The healthcare provider might feel the groin area and ask you to cough. If an inguinal hernia is felt, the healthcare provider may try to slide it back into the abdomen.   Usually no other tests are needed.  TREATMENT  Treatments can vary. The size of the hernia makes a difference. Options include:  Watchful waiting. This is often suggested if the hernia is small and you have had no symptoms.   No medical procedure will be done unless symptoms develop.   You will need to watch closely for symptoms. If any occur, contact your healthcare provider right away.   Surgery. This is used if the hernia is larger or you have symptoms.   Open surgery. This is usually an outpatient procedure (you will not stay overnight in a hospital). An cut (incision) is made  through the skin in the groin. The hernia is put back inside the abdomen. The weak area in the muscles is then repaired by herniorrhaphy or hernioplasty. Herniorrhaphy: in this type of surgery, the weak muscles are sewn back together. Hernioplasty: a patch or mesh is used to close the weak area in the abdominal wall.   Laparoscopy. In this procedure, a surgeon makes small incisions. A thin tube with a tiny video camera (called a laparoscope) is put into the abdomen. The surgeon repairs the hernia with mesh by looking with the video camera and using two long instruments.  HOME CARE INSTRUCTIONS   After surgery to repair an inguinal  hernia:   You will need to take pain medicine prescribed by your healthcare provider. Follow all directions carefully.   You will need to take care of the wound from the incision.   Your activity will be restricted for awhile. This will probably include no heavy lifting for several weeks. You also should not do anything too active for a few weeks. When you can return to work will depend on the type of job that you have.   During "watchful waiting" periods, you should:   Maintain a healthy weight.   Eat a diet high in fiber (fruits, vegetables and whole grains).   Drink plenty of fluids to avoid constipation. This means drinking enough water and other liquids to keep your urine clear or pale yellow.   Do not lift heavy objects.   Do not stand for long periods of time.   Quit smoking. This should keep you from developing a frequent cough.  SEEK MEDICAL CARE IF:   A bulge develops in your groin area.   You feel pain, a burning sensation or pressure in the groin. This might be worse if you are lifting or straining.   You develop a fever of more than 100.5 F (38.1 C).  SEEK IMMEDIATE MEDICAL CARE IF:   Pain in the groin increases suddenly.   A bulge in the groin gets bigger suddenly and does not go down.   For men, there is sudden pain in the scrotum. Or, the size of the scrotum increases.   A bulge in the groin area becomes red or purple and is painful to touch.   You have nausea or vomiting that does not go away.   You feel your heart beating much faster than normal.   You cannot have a bowel movement or pass gas.   You develop a fever of more than 102.0 F (38.9 C).  Document Released: 08/14/2008 Document Revised: 12/08/2010 Document Reviewed: 08/14/2008 [facility] Patient Information 2012 [place], [place].

## 2011-02-15 ENCOUNTER — Other Ambulatory Visit (INDEPENDENT_AMBULATORY_CARE_PROVIDER_SITE_OTHER): Payer: Self-pay | Admitting: General Surgery

## 2011-03-21 ENCOUNTER — Telehealth (INDEPENDENT_AMBULATORY_CARE_PROVIDER_SITE_OTHER): Payer: Self-pay

## 2011-03-21 NOTE — Telephone Encounter (Signed)
Pre op appt made with pt for 04-18-11.

## 2011-04-13 ENCOUNTER — Encounter (HOSPITAL_COMMUNITY): Payer: Self-pay | Admitting: Pharmacy Technician

## 2011-04-18 ENCOUNTER — Ambulatory Visit (INDEPENDENT_AMBULATORY_CARE_PROVIDER_SITE_OTHER): Payer: Medicare Other | Admitting: General Surgery

## 2011-05-05 ENCOUNTER — Other Ambulatory Visit: Payer: Self-pay | Admitting: Internal Medicine

## 2011-05-26 ENCOUNTER — Other Ambulatory Visit: Payer: Self-pay

## 2011-05-26 ENCOUNTER — Encounter (HOSPITAL_COMMUNITY)
Admission: RE | Admit: 2011-05-26 | Discharge: 2011-05-26 | Disposition: A | Discharge status: Home / Self Care | Payer: Medicare Other | Source: Ambulatory Visit | Attending: General Surgery | Admitting: General Surgery

## 2011-05-26 ENCOUNTER — Encounter (HOSPITAL_COMMUNITY): Payer: Self-pay

## 2011-05-26 LAB — CBC
HCT: 40.9 % (ref 39.0–52.0)
Hemoglobin: 14.1 g/dL (ref 13.0–17.0)
MCH: 34.6 pg — ABNORMAL HIGH (ref 26.0–34.0)
MCHC: 34.5 g/dL (ref 30.0–36.0)
MCV: 100.5 fL — ABNORMAL HIGH (ref 78.0–100.0)
RDW: 12.9 % (ref 11.5–15.5)

## 2011-05-26 LAB — BASIC METABOLIC PANEL
BUN: 15 mg/dL (ref 6–23)
Calcium: 10 mg/dL (ref 8.4–10.5)
Creatinine, Ser: 1.01 mg/dL (ref 0.50–1.35)
GFR calc Af Amer: >90 mL/min (ref >90)
GFR calc non Af Amer: 78 mL/min — ABNORMAL LOW (ref >90)
Glucose, Bld: 106 mg/dL — ABNORMAL HIGH (ref 70–99)
Potassium: 3.8 mEq/L (ref 3.5–5.1)

## 2011-05-26 NOTE — Pre-Procedure Instructions (Signed)
PT'S BLOOD PRESSURE IS 180/115 TODAY IN PREOP AT [facility]--PT STATES HE DOES NOT HAVE HISTORY OF ELEVATED BLOOD PRESSURE--EXCEPT SOMETIMES AT DOCTOR'S OFFICE. BLOOD PRESSURE RECHECKED AND WAS 179/112.  EKG, CBC AND BMET WERE DONE TODAY IN PREOP BECAUSE OF PT'S ELEVATED B/P AND ANESTHESIA GUIDELINES.

## 2011-05-26 NOTE — Patient Instructions (Addendum)
20 Nathan Wallace  05/26/2011             SURGERY DATE IS WED 06/01/11  AT 8:30 AM   Report to [facility] at 6:30 AM.  Call this number if you have problems the morning of surgery: [phone]   Remember:   Do not eat food OR DRINK ANYTHING AFTER MIDNIGHT THE NIGHT BEFORE YOUR SURGERY.    Take these medicines the morning of surgery with A SIP OF WATER: SIMVASTATIN   Do not wear jewelry.  Do not wear lotions.    Do not bring valuables to the hospital.  Contacts, dentures or bridgework may not be worn into surgery.  Leave suitcase in the car. After surgery it may be brought to your room.  For patients admitted to the hospital, checkout time is 11:00 AM the day of discharge.   Patients discharged the day of surgery will not be allowed to drive home.    Special Instructions: CHG Shower Use Special Wash: 1/2 bottle night before surgery and 1/2 bottle morning of surgery.   Please read over the following fact sheets that you were given: MRSA Information

## 2011-05-31 NOTE — H&P (Signed)
Nathan Wallace     MRN: [id number]   Description: 61 year old male  Provider: Ernestene Mention, MD  Department: [facility]      Vitals -     BP Pulse Temp Ht Wt BMI    136/82  64  97.2 F (36.2 C)  5' 11.5" (1.816 m)  158 lb 12.8 oz (72.031 kg)  21.84 kg/m2       History and Physical   Ernestene Mention, MD  t   Patient presents with   .  Pre-op Exam       eval LIH      HPI Nathan Wallace is a 61 y.o. male.    This gentleman was referred to me by Dr. Laymond Purser for evaluation of a left inguinal hernia. Dr. Oliver Barre is his primary care physician.  The patient underwent robotic prostatectomy by Dr. Laverle Patter on April 26, 2010. He has done fairly well from that. He felt a lump in his left groin in August of 2012 and it became painful. It has gotten somewhat bigger but is reducible. He has never had a hernia problem in the past.  He was in Dr. Danielle Dess office for routine followup and saw Valorie Roosevelt.  The left internal hernia was noted and he was referred for a general surgery consultation.    Past Medical History   Diagnosis  Date   .  Arthritis     .  GERD (gastroesophageal reflux disease)     .  Prostate cancer     .  Hyperlipidemia         border line       Past Surgical History   Procedure  Date   .  Prostatectomy     .  Spine surgery     .  Wrist surgery         Family History   Problem  Relation  Age of Onset   .  Hypertension  Mother     .  Cancer  Father         colon/but cancer free as of now 02/10/11      Social History History   Substance Use Topics   .  Smoking status:  Former Games developer   .  Smokeless tobacco:  Not on file   .  Alcohol Use:  Yes         5th a liquor a week      No Known Allergies    Current Outpatient Prescriptions   Medication  Sig  Dispense  Refill   .  CIALIS 20 MG tablet  TAKE 1 TABLET BY MOUTH EVERY OTHER DAY AS NEEDED   10 tablet   0   .  simvastatin (ZOCOR) 40 MG tablet  TAKE 1 TABLET BY MOUTH  EVERY DAY   90 tablet   0      Review of Systems   Constitutional: Negative for fever, chills and unexpected weight change.  HENT: Negative for hearing loss, congestion, sore throat, trouble swallowing and voice change.   Eyes: Negative for visual disturbance.  Respiratory: Negative for cough and wheezing.   Cardiovascular: Negative for chest pain, palpitations and leg swelling.  Gastrointestinal: Negative for nausea, vomiting, abdominal pain, diarrhea, constipation, blood in stool, abdominal distention, anal bleeding and rectal pain.  Genitourinary: Negative for hematuria and difficulty urinating.        Erectile dysfunction.  Musculoskeletal: Negative for arthralgias.  Skin: Negative for rash and wound.  Neurological: Negative for seizures, syncope, weakness and headaches.  Hematological: Negative for adenopathy. Does not bruise/bleed easily.  Psychiatric/Behavioral: Negative for confusion.    Blood pressure 136/82, pulse 64, temperature 97.2 F (36.2 C), height 5' 11.5" (1.816 m), weight 158 lb 12.8 oz (72.031 kg).   Physical Exam   Constitutional: He is oriented to person, place, and time. He appears well-developed and well-nourished. No distress.  HENT:   Head: Normocephalic.   Nose: Nose normal.   Mouth/Throat: No oropharyngeal exudate.  Eyes: Conjunctivae and EOM are normal. Pupils are equal, round, and reactive to light. Right eye exhibits no discharge. Left eye exhibits no discharge. No scleral icterus.  Neck: Normal range of motion. Neck supple. No JVD present. No tracheal deviation present. No thyromegaly present.  Cardiovascular: Normal rate, regular rhythm, normal heart sounds and intact distal pulses.    No murmur heard. Pulmonary/Chest: Effort normal and breath sounds normal. No stridor. No respiratory distress. He has no wheezes. He has no rales. He exhibits no tenderness.  Abdominal: Soft. Bowel sounds are normal. He exhibits no distension and no mass. There is  no tenderness. There is no rebound and no guarding.  Multiple trocar sites. Genitourinary: medium sized LIH, reducible. No hernia on right,  Musculoskeletal: Normal range of motion. He exhibits no edema and no tenderness.  Lymphadenopathy:    He has no cervical adenopathy.  Neurological: He is alert and oriented to person, place, and time. He has normal reflexes. Coordination normal.  Skin: Skin is warm and dry. No rash noted. He is not diaphoretic. No erythema. No pallor.  Psychiatric: He has a normal mood and affect. His behavior is normal. Judgment and thought content normal.    Data Reviewed I reviewed office to Dr. Laverle Patter and Dr. Jonny Ruiz.   Assessment   Symptomatic left inguinal hernia, reducible.  Prostate cancer, status post robotic prostatectomy.  Gastroesophageal reflux disease.  Arthritis  Hyperlipidemia.  Last colonoscopy 2010.   Plan Will schedule for elective repair of his left renal hernia with mesh in the near future at his convenience.  I discussed the indications and details of the surgery with him. Risks and complications have been outlined, including but not limited to bleeding, infection, recurrence of the hernia, nerve damage with chronic pain or numbness, injury to the testicle or bladder or intestine with major reconstructive surgery, cardiac pulmonary and thromboembolic problems. He seems to understand these issues well. At this time all of his questions were answered. He is in full agreement with this plan.     Angelia Mould. Derrell Lolling, M.D., [facility] General and Minimally invasive Surgery Breast and Colorectal Surgery Office:   [phone] Pager:   [phone]

## 2011-06-01 ENCOUNTER — Ambulatory Visit (HOSPITAL_COMMUNITY)
Admission: RE | Admit: 2011-06-01 | Discharge: 2011-06-01 | Disposition: A | Discharge status: Home / Self Care | Payer: Medicare Other | Source: Ambulatory Visit | Attending: General Surgery | Admitting: General Surgery

## 2011-06-01 ENCOUNTER — Encounter (HOSPITAL_COMMUNITY): Payer: Self-pay | Admitting: *Deleted

## 2011-06-01 ENCOUNTER — Encounter (HOSPITAL_COMMUNITY): Payer: Self-pay | Admitting: Anesthesiology

## 2011-06-01 ENCOUNTER — Encounter (HOSPITAL_COMMUNITY)
Admission: RE | Disposition: A | Discharge status: Home / Self Care | Payer: Self-pay | Source: Ambulatory Visit | Attending: General Surgery

## 2011-06-01 ENCOUNTER — Ambulatory Visit (HOSPITAL_COMMUNITY): Payer: Medicare Other | Admitting: Anesthesiology

## 2011-06-01 DIAGNOSIS — M129 Arthropathy, unspecified: Secondary | ICD-10-CM | POA: Insufficient documentation

## 2011-06-01 DIAGNOSIS — Z79899 Other long term (current) drug therapy: Secondary | ICD-10-CM | POA: Insufficient documentation

## 2011-06-01 DIAGNOSIS — K4090 Unilateral inguinal hernia, without obstruction or gangrene, not specified as recurrent: Secondary | ICD-10-CM | POA: Diagnosis present

## 2011-06-01 DIAGNOSIS — Z9079 Acquired absence of other genital organ(s): Secondary | ICD-10-CM | POA: Insufficient documentation

## 2011-06-01 DIAGNOSIS — E785 Hyperlipidemia, unspecified: Secondary | ICD-10-CM | POA: Insufficient documentation

## 2011-06-01 DIAGNOSIS — Z01810 Encounter for preprocedural cardiovascular examination: Secondary | ICD-10-CM | POA: Insufficient documentation

## 2011-06-01 DIAGNOSIS — Z01812 Encounter for preprocedural laboratory examination: Secondary | ICD-10-CM | POA: Insufficient documentation

## 2011-06-01 DIAGNOSIS — K219 Gastro-esophageal reflux disease without esophagitis: Secondary | ICD-10-CM | POA: Insufficient documentation

## 2011-06-01 DIAGNOSIS — C61 Malignant neoplasm of prostate: Secondary | ICD-10-CM | POA: Insufficient documentation

## 2011-06-01 HISTORY — PX: INGUINAL HERNIA REPAIR: SHX194

## 2011-06-01 SURGERY — REPAIR, HERNIA, INGUINAL, ADULT
Anesthesia: General | Site: Groin | Laterality: Left | Wound class: Clean

## 2011-06-01 MED ORDER — LIDOCAINE-EPINEPHRINE 1 %-1:100000 IJ SOLN
INTRAMUSCULAR | Status: AC
  Filled 2011-06-01: qty 1

## 2011-06-01 MED ORDER — CEFAZOLIN SODIUM 1-5 GM-% IV SOLN
INTRAVENOUS | Status: AC
  Filled 2011-06-01: qty 50

## 2011-06-01 MED ORDER — ROCURONIUM BROMIDE 100 MG/10ML IV SOLN
INTRAVENOUS | Status: DC | PRN
  Administered 2011-06-01: 45 mg via INTRAVENOUS

## 2011-06-01 MED ORDER — SODIUM CHLORIDE 0.9 % IV SOLN: 250.0000 mL | INTRAVENOUS | Status: DC | PRN

## 2011-06-01 MED ORDER — GLYCOPYRROLATE 0.2 MG/ML IJ SOLN
INTRAMUSCULAR | Status: DC | PRN
  Administered 2011-06-01: .8 mg via INTRAVENOUS

## 2011-06-01 MED ORDER — HEPARIN SODIUM (PORCINE) 5000 UNIT/ML IJ SOLN
INTRAMUSCULAR | Status: AC
  Administered 2011-06-01: 5000 [IU]
  Filled 2011-06-01: qty 1

## 2011-06-01 MED ORDER — ACETAMINOPHEN 325 MG PO TABS: 650.0000 mg | ORAL_TABLET | ORAL | Status: DC | PRN

## 2011-06-01 MED ORDER — PROMETHAZINE HCL 25 MG/ML IJ SOLN: 12.5000 mg | Freq: Four times a day (QID) | INTRAMUSCULAR | Status: DC | PRN

## 2011-06-01 MED ORDER — CEFAZOLIN SODIUM 1-5 GM-% IV SOLN
1.0000 g | INTRAVENOUS | Status: AC
  Administered 2011-06-01: 1 g via INTRAVENOUS

## 2011-06-01 MED ORDER — ACETAMINOPHEN 10 MG/ML IV SOLN
INTRAVENOUS | Status: AC
  Filled 2011-06-01: qty 100

## 2011-06-01 MED ORDER — MIDAZOLAM HCL 5 MG/5ML IJ SOLN
INTRAMUSCULAR | Status: DC | PRN
  Administered 2011-06-01: 2 mg via INTRAVENOUS

## 2011-06-01 MED ORDER — BUPIVACAINE LIPOSOME 1.3 % IJ SUSP
20.0000 mL | Freq: Once | INTRAMUSCULAR | Status: AC
  Administered 2011-06-01: 16 mL
  Filled 2011-06-01: qty 20

## 2011-06-01 MED ORDER — HYDROMORPHONE HCL PF 1 MG/ML IJ SOLN
INTRAMUSCULAR | Status: AC
  Filled 2011-06-01: qty 1

## 2011-06-01 MED ORDER — PROMETHAZINE HCL 25 MG/ML IJ SOLN: 6.2500 mg | INTRAMUSCULAR | Status: DC | PRN

## 2011-06-01 MED ORDER — ACETAMINOPHEN 10 MG/ML IV SOLN
INTRAVENOUS | Status: DC | PRN
  Administered 2011-06-01: 1000 mg via INTRAVENOUS

## 2011-06-01 MED ORDER — SODIUM CHLORIDE 0.9 % IJ SOLN: 3.0000 mL | Freq: Two times a day (BID) | INTRAMUSCULAR | Status: DC

## 2011-06-01 MED ORDER — SODIUM CHLORIDE 0.9 % IJ SOLN: 3.0000 mL | INTRAMUSCULAR | Status: DC | PRN

## 2011-06-01 MED ORDER — LACTATED RINGERS IV SOLN
INTRAVENOUS | Status: DC | PRN
  Administered 2011-06-01 (×2): via INTRAVENOUS

## 2011-06-01 MED ORDER — ONDANSETRON HCL 4 MG/2ML IJ SOLN: 4.0000 mg | Freq: Four times a day (QID) | INTRAMUSCULAR | Status: DC | PRN

## 2011-06-01 MED ORDER — HYDROMORPHONE HCL PF 1 MG/ML IJ SOLN
0.2500 mg | INTRAMUSCULAR | Status: DC | PRN
  Administered 2011-06-01 (×2): 0.5 mg via INTRAVENOUS

## 2011-06-01 MED ORDER — ACETAMINOPHEN 650 MG RE SUPP
650.0000 mg | RECTAL | Status: DC | PRN
  Filled 2011-06-01: qty 1

## 2011-06-01 MED ORDER — SODIUM CHLORIDE 0.9 % IJ SOLN
INTRAMUSCULAR | Status: DC | PRN
  Administered 2011-06-01: 16 mL

## 2011-06-01 MED ORDER — 0.9 % SODIUM CHLORIDE (POUR BTL) OPTIME
TOPICAL | Status: DC | PRN
  Administered 2011-06-01: 1000 mL

## 2011-06-01 MED ORDER — OXYCODONE HCL 5 MG PO TABS: 5.0000 mg | ORAL_TABLET | ORAL | Status: DC | PRN

## 2011-06-01 MED ORDER — FENTANYL CITRATE 0.05 MG/ML IJ SOLN
INTRAMUSCULAR | Status: DC | PRN
  Administered 2011-06-01: 50 ug via INTRAVENOUS
  Administered 2011-06-01 (×2): 100 ug via INTRAVENOUS

## 2011-06-01 MED ORDER — EPHEDRINE SULFATE 50 MG/ML IJ SOLN
INTRAMUSCULAR | Status: DC | PRN
  Administered 2011-06-01: 5 mg via INTRAVENOUS

## 2011-06-01 MED ORDER — SODIUM CHLORIDE 0.9 % IV SOLN
INTRAVENOUS | Status: DC
  Administered 2011-06-01: 11:00:00 via INTRAVENOUS

## 2011-06-01 MED ORDER — OXYCODONE HCL 5 MG PO TABS
ORAL_TABLET | ORAL | Status: AC
  Administered 2011-06-01: 5 mg
  Filled 2011-06-01: qty 1

## 2011-06-01 MED ORDER — ONDANSETRON HCL 4 MG/2ML IJ SOLN
INTRAMUSCULAR | Status: DC | PRN
  Administered 2011-06-01: 4 mg via INTRAVENOUS

## 2011-06-01 MED ORDER — HEPARIN SODIUM (PORCINE) 5000 UNIT/ML IJ SOLN: 5000.0000 [IU] | Freq: Once | INTRAMUSCULAR | Status: DC

## 2011-06-01 MED ORDER — LIDOCAINE HCL (CARDIAC) 20 MG/ML IV SOLN
INTRAVENOUS | Status: DC | PRN
  Administered 2011-06-01: 80 mg via INTRAVENOUS

## 2011-06-01 MED ORDER — NEOSTIGMINE METHYLSULFATE 1 MG/ML IJ SOLN
INTRAMUSCULAR | Status: DC | PRN
  Administered 2011-06-01: 4 mg via INTRAVENOUS

## 2011-06-01 MED ORDER — PROPOFOL 10 MG/ML IV BOLUS
INTRAVENOUS | Status: DC | PRN
  Administered 2011-06-01: 180 mg via INTRAVENOUS

## 2011-06-01 MED ORDER — HYDROCODONE-ACETAMINOPHEN 5-325 MG PO TABS: 1.0000 | ORAL_TABLET | ORAL | Status: AC | PRN

## 2011-06-01 SURGICAL SUPPLY — 47 items
ADH SKN CLS APL DERMABOND .7 (GAUZE/BANDAGES/DRESSINGS) ×1
APL SKNCLS STERI-STRIP NONHPOA (GAUZE/BANDAGES/DRESSINGS)
BENZOIN TINCTURE PRP APPL 2/3 (GAUZE/BANDAGES/DRESSINGS) IMPLANT
BLADE HEX COATED 2.75 (ELECTRODE) ×2 IMPLANT
BLADE SURG 15 STRL LF DISP TIS (BLADE) ×1 IMPLANT
BLADE SURG 15 STRL SS (BLADE) ×2
BLADE SURG SZ10 CARB STEEL (BLADE) IMPLANT
CANISTER SUCTION 2500CC (MISCELLANEOUS) ×2 IMPLANT
CLOTH BEACON ORANGE TIMEOUT ST (SAFETY) ×2 IMPLANT
DECANTER SPIKE VIAL GLASS SM (MISCELLANEOUS) ×2 IMPLANT
DERMABOND ADVANCED (GAUZE/BANDAGES/DRESSINGS) ×1
DERMABOND ADVANCED .7 DNX12 (GAUZE/BANDAGES/DRESSINGS) IMPLANT
DRAIN PENROSE 18X1/2 LTX STRL (DRAIN) ×1 IMPLANT
DRAPE LAPAROTOMY TRNSV 102X78 (DRAPE) ×2 IMPLANT
ELECT REM PT RETURN 9FT ADLT (ELECTROSURGICAL) ×2
ELECTRODE REM PT RTRN 9FT ADLT (ELECTROSURGICAL) ×1 IMPLANT
GLOVE BIOGEL PI IND STRL 7.0 (GLOVE) ×1 IMPLANT
GLOVE BIOGEL PI INDICATOR 7.0 (GLOVE) ×1
GLOVE EUDERMIC 7 POWDERFREE (GLOVE) ×2 IMPLANT
GOWN STRL NON-REIN LRG LVL3 (GOWN DISPOSABLE) ×2 IMPLANT
GOWN STRL REIN XL XLG (GOWN DISPOSABLE) ×4 IMPLANT
KIT BASIN OR (CUSTOM PROCEDURE TRAY) ×2 IMPLANT
MESH ULTRAPRO 3X6 7.6X15CM (Mesh General) ×1 IMPLANT
NDL HYPO 25X1 1.5 SAFETY (NEEDLE) ×1 IMPLANT
NEEDLE HYPO 25X1 1.5 SAFETY (NEEDLE) ×2 IMPLANT
NS IRRIG 1000ML POUR BTL (IV SOLUTION) ×2 IMPLANT
PACK BASIC VI WITH GOWN DISP (CUSTOM PROCEDURE TRAY) ×2 IMPLANT
PENCIL BUTTON HOLSTER BLD 10FT (ELECTRODE) ×2 IMPLANT
SPONGE GAUZE 4X4 12PLY (GAUZE/BANDAGES/DRESSINGS) IMPLANT
SPONGE LAP 4X18 X RAY DECT (DISPOSABLE) ×2 IMPLANT
STAPLER VISISTAT 35W (STAPLE) IMPLANT
STRIP CLOSURE SKIN 1/2X4 (GAUZE/BANDAGES/DRESSINGS) IMPLANT
SUT MNCRL AB 4-0 PS2 18 (SUTURE) ×2 IMPLANT
SUT PROLENE 2 0 CT2 30 (SUTURE) ×7 IMPLANT
SUT SILK 2 0 (SUTURE) ×2
SUT SILK 2 0 SH (SUTURE) ×1 IMPLANT
SUT SILK 2-0 18XBRD TIE 12 (SUTURE) ×1 IMPLANT
SUT VIC AB 2-0 SH 27 (SUTURE) ×6
SUT VIC AB 2-0 SH 27X BRD (SUTURE) ×1 IMPLANT
SUT VIC AB 3-0 SH 27 (SUTURE)
SUT VIC AB 3-0 SH 27XBRD (SUTURE) ×1 IMPLANT
SUT VICRYL 2 0 18  UND BR (SUTURE)
SUT VICRYL 2 0 18 UND BR (SUTURE) IMPLANT
SYR BULB IRRIGATION 50ML (SYRINGE) ×2 IMPLANT
SYR CONTROL 10ML LL (SYRINGE) ×2 IMPLANT
TOWEL OR 17X26 10 PK STRL BLUE (TOWEL DISPOSABLE) ×2 IMPLANT
YANKAUER SUCT BULB TIP 10FT TU (MISCELLANEOUS) IMPLANT

## 2011-06-01 NOTE — Interval H&P Note (Signed)
History and Physical Interval Note:  06/01/2011 8:08 AM  Drenda Freeze  has presented today for surgery, with the diagnosis of symptomatic hernia left groin  The goals of treatment and the various methods of treatment have been discussed with the patient and family. After consideration of risks, benefits and other options for treatment, the patient has consented to  Procedure(s) (LRB): HERNIA REPAIR INGUINAL ADULT (Left) INSERTION OF MESH (Left) as a surgical intervention .  The patients' history has been reviewed today,  the patient is examined today, no change in status, stable for surgery.  I have reviewed the patients' chart and labs.  Questions were answered to the patient's satisfaction.     Nathan Wallace

## 2011-06-01 NOTE — Progress Notes (Signed)
Ambulated in hall and to BR tolerated fair

## 2011-06-01 NOTE — Op Note (Signed)
Patient Name:           Nathan Wallace   Date of Surgery:        06/01/2011  Pre op Diagnosis:      Left inguinal hernia  Post op Diagnosis:    same  Procedure:                 Open repair left inguinal hernia with mesh Armanda Heritage repair)  Surgeon:                     Angelia Mould. Derrell Lolling, M.D., FACS  Assistant:                      none  Operative Indications:   This gentleman underwent robotic prostatectomy in January of 2012. He has done well from that surgery. He felt a lump in his left groin in August of 2012. It has become painful and larger. He has no prior history of hernia. On exam he has a medium sized left inguinal hernia that is reducible. He desired repair and is brought to the operating room electively.  Operative Findings:       The patient had an indirect left inguinal hernia. The large hernia sac was empty, there were no incarcerated components. The cord structures were somewhat thickened and fibrotic making dissection a little bit more difficult than usual.  Procedure in Detail:          Following the induction of general endotracheal anesthesia the patient's abdomen and groins and genitalia were prepped and draped in a sterile fashion. Surgical time out was performed. Intravenous antibiotics were given. A 50% solution of Exparel was used as a local infiltration anesthetic, injecting into the subcutaneous tissue and into the muscle layers. A total of 35 cc of a 50% solution was injected.  A transverse, slightly oblique incision was made overlying the left inguinal canal. Dissection was carried down through the subcutaneous tissues exposing the aponeurosis of the external oblique. The external oblique was incised in the direction of its fibers opening up the external inguinal ring. The external oblique was dissected away from the underlying structures and self-retaining retractors were placed. There was a small sensory nerve which was closely adherent to the cord structures. It  was traced back to its emergence from the muscle laterally, clamped, divided, and ligated with 2-0 silk. The cord structures were carefully mobilized and encircled with a Penrose drain. Cremasteric muscle fibers were slowly skeletonized away from the thickened cord structures and sac. We ultimately were able to dissected the indirect hernia sac completely away from the cord structures and separated from the cord structures all the way back to the level of the internal ring. The indirect sac was opened and inspected. There were no contents. I could insert my finger through the sac into the peritoneal cavity.  The indirect sac was then twisted and suture ligated at the level of the internal ring with a suture ligature of 2-0 Vicryl. The redundant sac was excised and discarded. Hemostasis was excellent. The floor of the inguinal canal was repaired and reinforced with a 3" x 6" piece of Ultra Pro mesh. The mesh was trimmed at the corners to accommodate the contour and size of the wound. The mesh was sutured in place with running and interrupted sutures of 2-0 Prolene. The mesh was sutured so as to generously overlap the fascia at the pubic tubercle, then along the inguinal ligament inferiorly. Medially, superiorly, and  superiolaterally some interrupted sutures of proline were placed.  Further sutures were placed laterally. This provided a very secure repair both medial and lateral to the internal ring but allowed adequate fingertip opening for the cord structures. The wound was irrigated with saline. The external oblique was closed with a running suture of 2-0 Vicryl, placing  the cord structures deep to the external oblique. Scarpa's fascia was closed with 3-0 Vicryl sutures and the skin closed with a running subcuticular suture of 4-0 Monocryl and Dermabond. The patient tolerated the procedure well. There were no complications. Counts correct. EBL 15 cc.     Angelia Mould. Derrell Lolling, M.D., FACS General and Minimally  Invasive Surgery Breast and Colorectal Surgery  06/01/2011 9:41 AM

## 2011-06-01 NOTE — Progress Notes (Signed)
Incentive spirometry done by patient- inspired volume between 1500-2027ml-used x10

## 2011-06-01 NOTE — Anesthesia Preprocedure Evaluation (Signed)
Anesthesia Evaluation  Patient identified by MRN, date of birth, ID band Patient awake    Reviewed: Allergy & Precautions, H&P , NPO status , Patient's Chart, lab work & pertinent test results, reviewed documented beta blocker date and time   Airway Mallampati: II TM Distance: >3 FB Neck ROM: Full    Dental  (+) Teeth Intact and Dental Advisory Given   Pulmonary neg pulmonary ROS,  clear to auscultation        Cardiovascular neg cardio ROS Regular Normal Denies cardiac symptoms   Neuro/Psych Negative Neurological ROS  Negative Psych ROS   GI/Hepatic negative GI ROS, Neg liver ROS,   Endo/Other  Negative Endocrine ROS  Renal/GU negative Renal ROS  Genitourinary negative   Musculoskeletal negative musculoskeletal ROS (+)   Abdominal   Peds negative pediatric ROS (+)  Hematology negative hematology ROS (+)   Anesthesia Other Findings   Reproductive/Obstetrics negative OB ROS                           Anesthesia Physical Anesthesia Plan  ASA: I  Anesthesia Plan: General   Post-op Pain Management:    Induction: Intravenous  Airway Management Planned: Oral ETT  Additional Equipment:   Intra-op Plan:   Post-operative Plan: Extubation in OR  Informed Consent: I have reviewed the patients History and Physical, chart, labs and discussed the procedure including the risks, benefits and alternatives for the proposed anesthesia with the patient or authorized representative who has indicated his/her understanding and acceptance.   Dental advisory given  Plan Discussed with: CRNA and Surgeon  Anesthesia Plan Comments:         Anesthesia Quick Evaluation

## 2011-06-01 NOTE — Discharge Instructions (Signed)
[facility] _______[facility]  UMBILICAL OR INGUINAL HERNIA REPAIR: POST OP INSTRUCTIONS  Always review your discharge instruction sheet given to you by the facility where your surgery was performed. IF YOU HAVE DISABILITY OR FAMILY LEAVE FORMS, YOU MUST BRING THEM TO THE OFFICE FOR PROCESSING.   DO NOT GIVE THEM TO YOUR DOCTOR.  1. A  prescription for pain medication may be given to you upon discharge.  Take your pain medication as prescribed, if needed.  If narcotic pain medicine is not needed, then you may take acetaminophen (Tylenol) or ibuprofen (Advil) as needed. 2. Take your usually prescribed medications unless otherwise directed. 3. If you need a refill on your pain medication, please contact your pharmacy.  They will contact our office to request authorization. Prescriptions will not be filled after 5 pm or on week-ends. 4. You should follow a light diet the first 24 hours after arrival home, such as soup and crackers, etc.  Be sure to include lots of fluids daily.  Resume your normal diet the day after surgery. 5. Most patients will experience some swelling and bruising around the umbilicus or in the groin and scrotum.  Ice packs and reclining will help.  Swelling and bruising can take several days to resolve.  6. It is common to experience some constipation if taking pain medication after surgery.  Increasing fluid intake and taking a stool softener (such as Colace) will usually help or prevent this problem from occurring.  A mild laxative (Milk of Magnesia or Miralax) should be taken according to package directions if there are no bowel movements after 48 hours. 7. Unless discharge instructions indicate otherwise, you may remove your bandages 24-48 hours after surgery, and you may shower at that time.  You may have steri-strips (small skin tapes) in place directly over the incision.  These strips should be left on the skin for 7-10 days.  If your surgeon used skin glue on the  incision, you may shower in 24 hours.  The glue will flake off over the next 2-3 weeks.  Any sutures or staples will be removed at the office during your follow-up visit. 8. ACTIVITIES:  You may resume regular (light) daily activities beginning the next day--such as daily self-care, walking, climbing stairs--gradually increasing activities as tolerated.  You may have sexual intercourse when it is comfortable.  Refrain from any heavy lifting or straining until approved by your doctor. a. You may drive when you are no longer taking prescription pain medication, you can comfortably wear a seatbelt, and you can safely maneuver your car and apply brakes. b. RETURN TO WORK:  __________________________________________________________ 9. You should see your doctor in the office for a follow-up appointment approximately 2-3 weeks after your surgery.  Make sure that you call for this appointment within a day or two after you arrive home to insure a convenient appointment time. 10. OTHER INSTRUCTIONS:  __________________________________________________________________________________________________________________________________________________________________________________________  WHEN TO CALL YOUR DOCTOR: 1. Fever over 101.0 2. Inability to urinate 3. Nausea and/or vomiting 4. Extreme swelling or bruising 5. Continued bleeding from incision. 6. Increased pain, redness, or drainage from the incision  The clinic staff is available to answer your questions during regular business hours.  Please don't hesitate to call and ask to speak to one of the nurses for clinical concerns.  If you have a medical emergency, go to the nearest emergency room or call 911.  A surgeon from [facility] is always on call at the hospital     [address], [address], [place], [place]  [address] ?  [address] [address], [place], [place]   [address] [phone] ? [phone] ? FAX [phone] Web site:  [email or web address]  

## 2011-06-01 NOTE — Progress Notes (Signed)
States pain easing up past pain medication.

## 2011-06-01 NOTE — Progress Notes (Signed)
Dr. Shireen Quan in- made aware of patient's condition in PACU and of report given from [facility], CRNA- orders given- Incentive spirometry begun

## 2011-06-01 NOTE — Transfer of Care (Signed)
Immediate Anesthesia Transfer of Care Note  Patient: Nathan Wallace  Procedure(s) Performed: Procedure(s) (LRB): HERNIA REPAIR INGUINAL ADULT (Left) INSERTION OF MESH (Left)  Patient Location: PACU  Anesthesia Type: General  Level of Consciousness: awake and alert   Airway & Oxygen Therapy: Patient Spontanous Breathing and Patient connected to face mask oxygen  Post-op Assessment: Report given to PACU RN and Post -op Vital signs reviewed and stable  Post vital signs: Reviewed and stable  Complications: No apparent anesthesia complications

## 2011-06-01 NOTE — Progress Notes (Signed)
Incentive spirometry done by patient- inspired volume between 1500 and 1750-used x10

## 2011-06-01 NOTE — Anesthesia Postprocedure Evaluation (Signed)
  Anesthesia Post-op Note  Patient: Nathan Wallace  Procedure(s) Performed: Procedure(s) (LRB): HERNIA REPAIR INGUINAL ADULT (Left) INSERTION OF MESH (Left)  Patient Location: PACU  Anesthesia Type: General  Level of Consciousness: oriented and sedated  Airway and Oxygen Therapy: Patient Spontanous Breathing  Post-op Pain: mild  Post-op Assessment: Post-op Vital signs reviewed, Patient's Cardiovascular Status Stable, Respiratory Function Stable and Patent Airway  Post-op Vital Signs: stable  Complications: No apparent anesthesia complications

## 2011-06-02 ENCOUNTER — Telehealth (INDEPENDENT_AMBULATORY_CARE_PROVIDER_SITE_OTHER): Payer: Self-pay

## 2011-06-02 NOTE — Telephone Encounter (Signed)
Pt home doing well. Po appt made. 

## 2011-06-13 ENCOUNTER — Encounter (HOSPITAL_COMMUNITY): Payer: Self-pay | Admitting: General Surgery

## 2011-06-22 ENCOUNTER — Encounter: Payer: Self-pay | Admitting: Internal Medicine

## 2011-06-22 DIAGNOSIS — Z0000 Encounter for general adult medical examination without abnormal findings: Secondary | ICD-10-CM | POA: Insufficient documentation

## 2011-06-22 DIAGNOSIS — R7302 Impaired glucose tolerance (oral): Secondary | ICD-10-CM

## 2011-06-22 HISTORY — DX: Impaired glucose tolerance (oral): R73.02

## 2011-06-23 ENCOUNTER — Ambulatory Visit (INDEPENDENT_AMBULATORY_CARE_PROVIDER_SITE_OTHER): Payer: Medicare Other | Admitting: Internal Medicine

## 2011-06-23 ENCOUNTER — Encounter: Payer: Self-pay | Admitting: Internal Medicine

## 2011-06-23 ENCOUNTER — Other Ambulatory Visit (INDEPENDENT_AMBULATORY_CARE_PROVIDER_SITE_OTHER): Payer: Medicare Other

## 2011-06-23 VITALS — BP 142/100 | HR 67 | Temp 98.3°F | Ht 71.0 in | Wt 165.5 lb

## 2011-06-23 DIAGNOSIS — Z0000 Encounter for general adult medical examination without abnormal findings: Secondary | ICD-10-CM

## 2011-06-23 DIAGNOSIS — E785 Hyperlipidemia, unspecified: Secondary | ICD-10-CM

## 2011-06-23 DIAGNOSIS — R7309 Other abnormal glucose: Secondary | ICD-10-CM

## 2011-06-23 DIAGNOSIS — R7302 Impaired glucose tolerance (oral): Secondary | ICD-10-CM

## 2011-06-23 DIAGNOSIS — R55 Syncope and collapse: Secondary | ICD-10-CM

## 2011-06-23 DIAGNOSIS — I10 Essential (primary) hypertension: Secondary | ICD-10-CM | POA: Insufficient documentation

## 2011-06-23 DIAGNOSIS — Z79899 Other long term (current) drug therapy: Secondary | ICD-10-CM | POA: Insufficient documentation

## 2011-06-23 DIAGNOSIS — M25569 Pain in unspecified knee: Secondary | ICD-10-CM

## 2011-06-23 DIAGNOSIS — C61 Malignant neoplasm of prostate: Secondary | ICD-10-CM | POA: Insufficient documentation

## 2011-06-23 DIAGNOSIS — M25561 Pain in right knee: Secondary | ICD-10-CM

## 2011-06-23 DIAGNOSIS — Z8546 Personal history of malignant neoplasm of prostate: Secondary | ICD-10-CM

## 2011-06-23 LAB — CBC WITH DIFFERENTIAL/PLATELET
Basophils Absolute: 0 10*3/uL (ref 0.0–0.1)
Eosinophils Absolute: 0.1 10*3/uL (ref 0.0–0.7)
Hemoglobin: 13.8 g/dL (ref 13.0–17.0)
Lymphocytes Relative: 31 % (ref 12.0–46.0)
MCHC: 33.9 g/dL (ref 30.0–36.0)
MCV: 103.6 fl — ABNORMAL HIGH (ref 78.0–100.0)
Monocytes Absolute: 0.5 10*3/uL (ref 0.1–1.0)
Neutro Abs: 3.4 10*3/uL (ref 1.4–7.7)
RDW: 13.7 % (ref 11.5–14.6)

## 2011-06-23 LAB — HEPATIC FUNCTION PANEL
ALT: 50 U/L (ref 0–53)
Alkaline Phosphatase: 59 U/L (ref 39–117)
Bilirubin, Direct: 0.1 mg/dL (ref 0.0–0.3)
Total Bilirubin: 0.6 mg/dL (ref 0.3–1.2)
Total Protein: 7.9 g/dL (ref 6.0–8.3)

## 2011-06-23 LAB — BASIC METABOLIC PANEL
CO2: 28 mEq/L (ref 19–32)
Calcium: 9.6 mg/dL (ref 8.4–10.5)
Chloride: 108 mEq/L (ref 96–112)
Sodium: 143 mEq/L (ref 135–145)

## 2011-06-23 LAB — LIPID PANEL: Total CHOL/HDL Ratio: 3

## 2011-06-23 LAB — LDL CHOLESTEROL, DIRECT: Direct LDL: 128.9 mg/dL

## 2011-06-23 LAB — URINALYSIS, ROUTINE W REFLEX MICROSCOPIC
Bilirubin Urine: NEGATIVE
Ketones, ur: NEGATIVE
Urine Glucose: NEGATIVE
Urobilinogen, UA: 0.2 (ref 0.0–1.0)

## 2011-06-23 LAB — HEMOGLOBIN A1C: Hgb A1c MFr Bld: 6 % (ref 4.6–6.5)

## 2011-06-23 MED ORDER — TADALAFIL 20 MG PO TABS: 20.0000 mg | ORAL_TABLET | Freq: Every day | ORAL | Status: DC | PRN

## 2011-06-23 MED ORDER — IRBESARTAN 150 MG PO TABS: 150.0000 mg | ORAL_TABLET | Freq: Every day | ORAL | Status: DC

## 2011-06-23 MED ORDER — ASPIRIN 81 MG PO TBEC: 81.0000 mg | DELAYED_RELEASE_TABLET | Freq: Every day | ORAL | Status: DC

## 2011-06-23 MED ORDER — SIMVASTATIN 40 MG PO TABS: 40.0000 mg | ORAL_TABLET | Freq: Every day | ORAL | Status: DC

## 2011-06-23 NOTE — Assessment & Plan Note (Signed)
New onset, for avapro 150 qd, f/u labs and BP at home and next visit  BP Readings from Last 3 Encounters:  06/23/11 142/100  06/01/11 137/82  06/01/11 137/82

## 2011-06-23 NOTE — Patient Instructions (Signed)
Please start Aspirin 81 mg - 1 per day - COATED only Take all new medications as prescribed - the avapro generic 150 mg per day Continue all other medications as before - all were refilled today Please have the pharmacy call with any refills you may need. You will be contacted regarding the referral for: orthopedic for the knee, as well as echocardiogram (ultrasound for the heart), carotid artery ultrasound, Head MRI Please go to LAB in the Basement for the blood and/or urine tests to be done today You will be contacted by phone if any changes need to be made immediately.  Otherwise, you will receive a letter about your results with an explanation. Please return in 6 mo with Lab testing done 3-5 days before

## 2011-06-24 ENCOUNTER — Telehealth: Payer: Self-pay | Admitting: Internal Medicine

## 2011-06-24 ENCOUNTER — Encounter: Payer: Self-pay | Admitting: Internal Medicine

## 2011-06-24 MED ORDER — ATORVASTATIN CALCIUM 40 MG PO TABS: 40.0000 mg | ORAL_TABLET | Freq: Every day | ORAL | Status: DC

## 2011-06-24 NOTE — Telephone Encounter (Signed)
Message copied by Corwin Levins on Fri Jun 24, 2011  2:57 PM ------      Message from: Scharlene Gloss B      Created: Fri Jun 24, 2011  9:21 AM       Called the patient informed of results. He has been taking his simvastatin and he is ok to change to Lipitor if needed. Please send to [facility] [address].

## 2011-06-24 NOTE — Telephone Encounter (Signed)
Ok to change simvastatin to lipitor 40 mg with f/u labs next visit  Letter also sent

## 2011-06-26 ENCOUNTER — Encounter: Payer: Self-pay | Admitting: Internal Medicine

## 2011-06-26 DIAGNOSIS — G250 Essential tremor: Secondary | ICD-10-CM | POA: Insufficient documentation

## 2011-06-26 DIAGNOSIS — M1711 Unilateral primary osteoarthritis, right knee: Secondary | ICD-10-CM | POA: Insufficient documentation

## 2011-06-26 NOTE — Assessment & Plan Note (Signed)

## 2011-06-26 NOTE — Assessment & Plan Note (Signed)
For lft with f/u statin use

## 2011-06-26 NOTE — Progress Notes (Signed)
Subjective:    Patient ID: Nathan Wallace, male    DOB: 30-Jul-1950, 61 y.o.   MRN: [id number]  HPI  Here for wellness and f/u;  Overall doing ok;  Pt denies CP, worsening SOB, DOE, wheezing, orthopnea, PND, worsening LE edema, palpitations.  Pt denies neurological change such as new Headache, facial or extremity weakness.  Pt denies polydipsia, polyuria, or low sugar symptoms. Pt states overall good compliance with treatment and medications, good tolerability, and trying to follow lower cholesterol diet.  Pt denies worsening depressive symptoms, suicidal ideation or panic. No fever, wt loss, night sweats, loss of appetite, or other constitutional symptoms.  Pt states good ability with ADL's, low fall risk, home safety reviewed and adequate, no significant changes in hearing or vision, and minimally active with exercise due to overall worsening pain to the right knee, with swelling recurrent and mostly persistent daily since fall down stairs June 2012, no further giveaway or falls, lock or click.  BP has been elevated several times in the past yr, noted with prostate surgury per pt.  Also mentions 2 episodes frank syncope, the first just after Da Vinci prostatectomy in the shower at home, no injury, and did not recur until fell without warning a second time coming down a flight of stairs carrying a first beer (not yet started drinking) and fell down about 20 stairs to the basement, injurying shoulder (still sore and somewhat decreased ROM, but declines further eval) and the left knee as above.  Fall was witnessed, came to within a few min, no siezure activity, no incontinence, no CP, sob or dizziness prior, none since.  EMS called but he declined ER at the time.  Of note ECG done last month, declines repeat today.  Asks for handicap form for parking given the left knee today, cant walk well after about 50 ft. Past Medical History  Diagnosis Date  . Prostate cancer   . Hyperlipidemia     border line  . GERD  (gastroesophageal reflux disease)     NO RECENT PROBLEMS AND NO MEDS  . Arthritis     RT KNEE AND RT SHOULDER  . Impaired glucose tolerance 06/22/2011  . Syncope 06/23/2011  . Degenerative joint disease of knee, right   . Impaired glucose tolerance   . Benign head tremor    Past Surgical History  Procedure Date  . Prostatectomy 04/2010    ROBOTIC -FOR CA  . Spine surgery 2003 OR 2004    CERVICAL FUSION-STATES LIMITED ROM NECK  . Wrist surgery   . Inguinal hernia repair 06/01/2011    Procedure: HERNIA REPAIR INGUINAL ADULT;  Surgeon: Ernestene Mention, MD;  Location: [facility];  Service: General;  Laterality: Left;  Left Inguinal Hernia Repair with Mesh  . Hernia repair 2013    left inguinal  . Robot assisted laparoscopic radical prostatectomy jan 2012    da vinci    reports that he has quit smoking. He has never used smokeless tobacco. He reports that he drinks alcohol. He reports that he does not use illicit drugs. family history includes Cancer in his father and Hypertension in his mother. No Known Allergies No current outpatient prescriptions on file prior to visit.   Review of Systems Review of Systems  Constitutional: Negative for diaphoresis, activity change, appetite change and unexpected weight change.  HENT: Negative for hearing loss, ear pain, facial swelling, mouth sores and neck stiffness.   Eyes: Negative for pain, redness and visual disturbance.  Respiratory: Negative  for shortness of breath and wheezing.   Cardiovascular: Negative for chest pain and palpitations.  Gastrointestinal: Negative for diarrhea, blood in stool, abdominal distention and rectal pain.  Genitourinary: Negative for hematuria, flank pain and decreased urine volume.  Musculoskeletal: Negative for myalgias and joint swelling.  Skin: Negative for color change and wound.  Neurological: Negative for syncope and numbness. except for the above Hematological: Negative for adenopathy.    Psychiatric/Behavioral: Negative for hallucinations, self-injury, decreased concentration and agitation.      Objective:   Physical Exam BP 142/100  Pulse 67  Temp(Src) 98.3 F (36.8 C) (Oral)  Ht 5\' 11"  (1.803 m)  Wt 165 lb 8 oz (75.07 kg)  BMI 23.08 kg/m2  SpO2 98% Physical Exam  VS noted Constitutional: Pt is oriented to person, place, and time. Appears well-developed and well-nourished.  HENT:  Head: Normocephalic and atraumatic.  Right Ear: External ear normal.  Left Ear: External ear normal.  Nose: Nose normal.  Mouth/Throat: Oropharynx is clear and moist.  Eyes: Conjunctivae and EOM are normal. Pupils are equal, round, and reactive to light.  Neck: Normal range of motion. Neck supple. No JVD present. No tracheal deviation present.  Cardiovascular: Normal rate, regular rhythm, normal heart sounds and intact distal pulses.   Pulmonary/Chest: Effort normal and breath sounds normal.  Abdominal: Soft. Bowel sounds are normal. There is no tenderness.  Musculoskeletal: Normal range of motion. Exhibits no edema.  Lymphadenopathy:  Has no cervical adenopathy.  Neurological: Pt is alert and oriented to person, place, and time. Pt has normal reflexes. No cranial nerve deficit.  Skin: Skin is warm and dry. No rash noted.  Psychiatric:  Has  normal mood and affect. Behavior is normal. not nervous or depressed affect Left knee with bony arthritic change, small effusion, NT, crepitus and decreased ROM    Assessment & Plan:

## 2011-06-27 ENCOUNTER — Other Ambulatory Visit: Payer: Self-pay | Admitting: Cardiology

## 2011-06-27 ENCOUNTER — Other Ambulatory Visit (HOSPITAL_COMMUNITY): Payer: Self-pay | Admitting: Radiology

## 2011-06-27 DIAGNOSIS — R55 Syncope and collapse: Secondary | ICD-10-CM

## 2011-06-28 ENCOUNTER — Encounter: Payer: Self-pay | Admitting: Internal Medicine

## 2011-06-28 ENCOUNTER — Ambulatory Visit (HOSPITAL_COMMUNITY): Payer: Medicare Other | Attending: Cardiology

## 2011-06-28 ENCOUNTER — Other Ambulatory Visit: Payer: Self-pay

## 2011-06-28 ENCOUNTER — Ambulatory Visit (INDEPENDENT_AMBULATORY_CARE_PROVIDER_SITE_OTHER): Payer: Medicare Other | Admitting: General Surgery

## 2011-06-28 ENCOUNTER — Encounter (INDEPENDENT_AMBULATORY_CARE_PROVIDER_SITE_OTHER): Payer: Self-pay | Admitting: General Surgery

## 2011-06-28 VITALS — BP 142/90 | HR 74 | Temp 97.8°F | Resp 18 | Ht 71.5 in | Wt 164.6 lb

## 2011-06-28 DIAGNOSIS — K4090 Unilateral inguinal hernia, without obstruction or gangrene, not specified as recurrent: Secondary | ICD-10-CM

## 2011-06-28 DIAGNOSIS — I10 Essential (primary) hypertension: Secondary | ICD-10-CM | POA: Insufficient documentation

## 2011-06-28 DIAGNOSIS — E785 Hyperlipidemia, unspecified: Secondary | ICD-10-CM | POA: Insufficient documentation

## 2011-06-28 DIAGNOSIS — R55 Syncope and collapse: Secondary | ICD-10-CM

## 2011-06-28 NOTE — Progress Notes (Signed)
Subjective:     Patient ID: Nathan Wallace, male   DOB: January 05, 1951, 61 y.o.   MRN: [id number]  HPI This patient underwent open repair of left inguinal hernia with mesh on June 01, 2011. He has done very well. He has no pain. He's moving about easily. No complaints about his wound.  He is status post robotic prostatectomy. He still has a little bit of incontinence and is working on pelvic floor exercises for that. He is followed by Dr. Laverle Patter.  Review of Systems     Objective:   Physical Exam Patient looks well. In good spirits.  Abdomen is soft and nontender. He is examined standing. The left groin incision is well healed. There is no infection, no hematoma, no seroma, no tenderness. No evidence of recurrence. Penis scrotum and testes are normal.    Assessment:     Left inguinal hernia, uneventful recovery following Cytogeneticist.    Plan:     Okay to resume all normal physical activities without restriction. Stretching exercises discussed.  Return to see me p.r.n.    Angelia Mould. Derrell Lolling, M.D., [facility] General and Minimally invasive Surgery Breast and Colorectal Surgery Office:   [phone] Pager:   [phone]

## 2011-06-28 NOTE — Patient Instructions (Signed)
You have recovered from your left inguinal hernia surgery. The hernia repair is intact. You may resume all normal physical activities without restriction. I recommended that you stretch before and after exercise.  Return to see Dr. Derrell Lolling if any new problems arise.

## 2011-06-29 ENCOUNTER — Telehealth: Payer: Self-pay

## 2011-06-29 MED ORDER — VARDENAFIL HCL 20 MG PO TABS: 20.0000 mg | ORAL_TABLET | ORAL | Status: AC | PRN

## 2011-06-29 NOTE — Telephone Encounter (Signed)
Faxed hardcopy to  [employer] per patient request.

## 2011-06-29 NOTE — Telephone Encounter (Signed)
Patient called requesting alternative for Cialis as too expensive

## 2011-06-29 NOTE — Telephone Encounter (Signed)
Ok for [employer] to robin  It is $10 per pill at [facility] ONLY

## 2011-07-20 ENCOUNTER — Other Ambulatory Visit: Payer: Medicare Other

## 2011-08-03 ENCOUNTER — Encounter: Payer: Self-pay | Admitting: Internal Medicine

## 2011-08-03 ENCOUNTER — Ambulatory Visit
Admission: RE | Admit: 2011-08-03 | Discharge: 2011-08-03 | Disposition: A | Discharge status: Home / Self Care | Payer: Medicare Other | Source: Ambulatory Visit | Attending: Internal Medicine | Admitting: Internal Medicine

## 2011-08-03 ENCOUNTER — Other Ambulatory Visit: Payer: Self-pay | Admitting: Internal Medicine

## 2011-08-03 ENCOUNTER — Encounter: Payer: Self-pay | Admitting: Neurology

## 2011-08-03 DIAGNOSIS — R55 Syncope and collapse: Secondary | ICD-10-CM

## 2011-08-03 DIAGNOSIS — I619 Nontraumatic intracerebral hemorrhage, unspecified: Secondary | ICD-10-CM

## 2011-08-03 DIAGNOSIS — G9340 Encephalopathy, unspecified: Secondary | ICD-10-CM | POA: Insufficient documentation

## 2011-08-03 HISTORY — DX: Nontraumatic intracerebral hemorrhage, unspecified: I61.9

## 2011-08-03 NOTE — Telephone Encounter (Signed)
The test results show that your current MRI surprisingly shows a prior type of stroke where there was bleeding into the brain.  There is no way to known when this occurred.  Please continue all the same medication for now, but you should see Neurology for evaluation.   There is no other need for change of treatment or further evaluation based on these results.

## 2011-10-07 ENCOUNTER — Encounter: Payer: Self-pay | Admitting: Neurology

## 2011-10-07 ENCOUNTER — Ambulatory Visit (INDEPENDENT_AMBULATORY_CARE_PROVIDER_SITE_OTHER): Payer: Medicare Other | Admitting: Neurology

## 2011-10-07 VITALS — BP 130/90 | HR 68 | Wt 162.0 lb

## 2011-10-07 DIAGNOSIS — I639 Cerebral infarction, unspecified: Secondary | ICD-10-CM

## 2011-10-07 DIAGNOSIS — M5410 Radiculopathy, site unspecified: Secondary | ICD-10-CM

## 2011-10-07 DIAGNOSIS — IMO0002 Reserved for concepts with insufficient information to code with codable children: Secondary | ICD-10-CM

## 2011-10-07 DIAGNOSIS — G5600 Carpal tunnel syndrome, unspecified upper limb: Secondary | ICD-10-CM

## 2011-10-07 DIAGNOSIS — I6350 Cerebral infarction due to unspecified occlusion or stenosis of unspecified cerebral artery: Secondary | ICD-10-CM

## 2011-10-07 MED ORDER — GABAPENTIN 300 MG PO CAPS: 300.0000 mg | ORAL_CAPSULE | Freq: Every evening | ORAL | Status: DC | PRN

## 2011-10-07 NOTE — Patient Instructions (Addendum)
Your NCV/EMG is scheduled at [facility] located at [address] in [place] on Friday, July 5th at 10:45 am. Please arrive 15 minutes prior to your scheduled appointment. [phone]    Your EEG is scheduled at [facility] on Tuesday, July 9th at 11:00 am.  Please arrive at first floor admitting fifteen minutes prior to your appointment.   [phone].   Your CTA head and neck is scheduled at [facility] on Wednesday, July 3rd at 10:00 am. Please check in at the first floor radiology department 15 minutes prior to your scheduled appointment time. Nothing to eat 4 hours prior to the test; liquids are ok.   [phone].

## 2011-10-07 NOTE — Progress Notes (Signed)
Dear Dr. Jonny Ruiz,  Thank you for having me see Nathan Wallace in consultation today at [facility] for his problem with a caudate infarction on MRI.  As you may recall, he is a 61 y.o. year old male with a history of HLD, prostate cancer who has had two spells of passing out, one in January 2012 soon after his prostate surgery and another 6 months later.  The first occurred in the shower, he was in pain, and he felt lightheaded and "almost went out".  The second occurred six months later, was without warning.  No signs of seizures, but he did fall down 28 stairs.  Hurts his shoulder.  Quick return to normal.  No bowel, bladder loss, tongue biting.  No other precipitating factors.  No chest pain, sob, palpitations.  He has had no other spells.  You ordered an MRI brain which showed a right caudate head infarction.  You also ordered a carotid doppler, which I can't find in the system, but was reportedly normal.  He has not had a Holter monitor.  He has many other complaints.  He gets cramping in his bilateral UE worse on the right with numbness in his bilateral hands.  He also gets shooting pains down his right leg and bilateral cramping in his legs at night.  He also has developed a mild head tremor that he does not notice.  Past Medical History  Diagnosis Date  . Prostate cancer   . Hyperlipidemia     border line  . GERD (gastroesophageal reflux disease)     NO RECENT PROBLEMS AND NO MEDS  . Arthritis     RT KNEE AND RT SHOULDER  . Impaired glucose tolerance 06/22/2011  . Syncope 06/23/2011  . Degenerative joint disease of knee, right   . Impaired glucose tolerance   . Benign head tremor   . Left inguinal hernia   . CVA (cerebrovascular accident due to intracerebral hemorrhage) 08/03/2011    Past Surgical History  Procedure Date  . Prostatectomy 04/2010    ROBOTIC -FOR CA  . Spine surgery 2003 OR 2004    CERVICAL FUSION-STATES LIMITED ROM NECK  . Wrist surgery   . Inguinal  hernia repair 06/01/2011    Procedure: HERNIA REPAIR INGUINAL ADULT;  Surgeon: Ernestene Mention, MD;  Location: [facility];  Service: General;  Laterality: Left;  Left Inguinal Hernia Repair with Mesh  . Robot assisted laparoscopic radical prostatectomy jan 2012    da vinci    History   Social History  . Marital Status: Married    Spouse Name: N/A    Number of Children: N/A  . Years of Education: N/A   Social History Main Topics  . Smoking status: Former Smoker    Quit date: 06/28/1966  . Smokeless tobacco: Never Used   Comment: QUIT SMOKING IN THE 70'S  . Alcohol Use: Yes     5th a liquor a week beer and vodka  . Drug Use: No  . Sexually Active: None   Other Topics Concern  . None   Social History Narrative  . None    Family History  Problem Relation Age of Onset  . Hypertension Mother   . Cancer Father     colon/but cancer free as of now 02/10/11  - mother had cerebral aneurysm that ruptured.  Current Outpatient Prescriptions on File Prior to Visit  Medication Sig Dispense Refill  . aspirin 81 MG EC tablet Take 1 tablet (81 mg  total) by mouth daily. Swallow whole.  30 tablet  12  . atorvastatin (LIPITOR) 40 MG tablet Take 1 tablet (40 mg total) by mouth daily.  90 tablet  3  . irbesartan (AVAPRO) 150 MG tablet Take 1 tablet (150 mg total) by mouth at bedtime.  90 tablet  3  . tadalafil (CIALIS) 20 MG tablet Take 1 tablet (20 mg total) by mouth daily as needed for erectile dysfunction.  10 tablet  11    No Known Allergies    ROS:  13 systems were reviewed and are notable for mild chest discomfort at times, arthritis of the right wrist.  All other review of systems are unremarkable.   Examination:  Filed Vitals:   10/07/11 0844  BP: 130/90  Pulse: 68  Weight: 162 lb (73.483 kg)     In general, well appearing man.  Cardiovascular: The patient has a regular rate and rhythm.  Fundoscopy:  Disks are flat. Vessel caliber within normal limits.  Mental status:    The patient is oriented to person, place and time. Recent and remote memory are intact. Attention span and concentration are normal. Language including repetition, naming, following commands are intact. Fund of knowledge of current and historical events, as well as vocabulary are normal.  Cranial Nerves: Pupils are equally round and reactive to light. Visual fields full to confrontation. Extraocular movements are intact without nystagmus. Facial sensation and muscles of mastication are intact. Muscles of facial expression reveal mild flattening of the left NL fold. Hearing intact to bilateral finger rub. Tongue protrusion, uvula, palate midline.  Shoulder shrug intact  Motor:  The patient has normal bulk and tone, no pronator drift.  There are no adventitious movements.  5/5 muscle strength bilaterally.  Reflexes:   Biceps  Triceps Brachioradialis Knee Ankle  Right 0  0  0   2+ 2+  Left  0  0  0   0 0  Toes down  Coordination:  Normal finger to nose.  No dysdiadokinesia.  Sensation is decreased to temperature and pin across multiple dermatomes in right leg, vibration position intact bilaterall.  Gait and Station are normal.    Romberg mild swaying.   Impression/Recs: 1.  Caudate infarction - have asked the patient to switch to aspirin 325mg  daily.  Will get a CTA of his head and neck to also r/o aneurysm as his mother had one. 2.  Bilateral hand cramping/leg pain - ?CTS getting a RUE, RLE EMG/NCS as he also has decreased sensation in right leg and shooting pain in that leg.  I am going to give him a prescription for leg cramping - gabapentin 300mg  qhs. 3.  passing out spells - unknown etiology.  Will get a routine EEG although I don't think they seem like seizures.   If they recur would suggest a Holter monitor   We will see the patient back in 2 months.  Thank you for having [place] see Nathan Wallace in consultation.  Feel free to contact me with any questions.  Lupita Raider Modesto Charon,  MD [facility], [place] [address] [place], [place] [address] Phone: [phone] Fax: [phone].

## 2011-10-12 ENCOUNTER — Other Ambulatory Visit (HOSPITAL_COMMUNITY): Payer: Medicare Other

## 2011-10-18 ENCOUNTER — Ambulatory Visit (HOSPITAL_COMMUNITY): Payer: Medicare Other

## 2011-11-03 ENCOUNTER — Emergency Department (HOSPITAL_COMMUNITY)
Admission: EM | Admit: 2011-11-03 | Discharge: 2011-11-03 | Disposition: A | Discharge status: Home / Self Care | Payer: Medicare Other | Attending: Emergency Medicine | Admitting: Emergency Medicine

## 2011-11-03 ENCOUNTER — Emergency Department (HOSPITAL_COMMUNITY): Payer: Medicare Other

## 2011-11-03 ENCOUNTER — Ambulatory Visit (HOSPITAL_COMMUNITY)
Admission: RE | Admit: 2011-11-03 | Discharge: 2011-11-03 | Disposition: A | Discharge status: Home / Self Care | Payer: Medicare Other | Source: Ambulatory Visit | Attending: Neurology | Admitting: Neurology

## 2011-11-03 ENCOUNTER — Telehealth: Payer: Self-pay | Admitting: Neurology

## 2011-11-03 DIAGNOSIS — E785 Hyperlipidemia, unspecified: Secondary | ICD-10-CM | POA: Insufficient documentation

## 2011-11-03 DIAGNOSIS — I639 Cerebral infarction, unspecified: Secondary | ICD-10-CM

## 2011-11-03 DIAGNOSIS — M542 Cervicalgia: Secondary | ICD-10-CM | POA: Insufficient documentation

## 2011-11-03 DIAGNOSIS — M1710 Unilateral primary osteoarthritis, unspecified knee: Secondary | ICD-10-CM | POA: Insufficient documentation

## 2011-11-03 DIAGNOSIS — Z800 Family history of malignant neoplasm of digestive organs: Secondary | ICD-10-CM | POA: Insufficient documentation

## 2011-11-03 DIAGNOSIS — Z8249 Family history of ischemic heart disease and other diseases of the circulatory system: Secondary | ICD-10-CM | POA: Insufficient documentation

## 2011-11-03 DIAGNOSIS — M129 Arthropathy, unspecified: Secondary | ICD-10-CM | POA: Insufficient documentation

## 2011-11-03 DIAGNOSIS — IMO0002 Reserved for concepts with insufficient information to code with codable children: Secondary | ICD-10-CM | POA: Insufficient documentation

## 2011-11-03 DIAGNOSIS — R9389 Abnormal findings on diagnostic imaging of other specified body structures: Secondary | ICD-10-CM

## 2011-11-03 DIAGNOSIS — Z8673 Personal history of transient ischemic attack (TIA), and cerebral infarction without residual deficits: Secondary | ICD-10-CM | POA: Insufficient documentation

## 2011-11-03 DIAGNOSIS — M25519 Pain in unspecified shoulder: Secondary | ICD-10-CM | POA: Insufficient documentation

## 2011-11-03 DIAGNOSIS — I2699 Other pulmonary embolism without acute cor pulmonale: Secondary | ICD-10-CM | POA: Insufficient documentation

## 2011-11-03 DIAGNOSIS — I6350 Cerebral infarction due to unspecified occlusion or stenosis of unspecified cerebral artery: Secondary | ICD-10-CM | POA: Insufficient documentation

## 2011-11-03 DIAGNOSIS — Z8546 Personal history of malignant neoplasm of prostate: Secondary | ICD-10-CM | POA: Insufficient documentation

## 2011-11-03 DIAGNOSIS — Z7982 Long term (current) use of aspirin: Secondary | ICD-10-CM | POA: Insufficient documentation

## 2011-11-03 DIAGNOSIS — Z0389 Encounter for observation for other suspected diseases and conditions ruled out: Secondary | ICD-10-CM | POA: Insufficient documentation

## 2011-11-03 LAB — PROTIME-INR
INR: 1.17 (ref 0.00–1.49)
Prothrombin Time: 15.1 seconds (ref 11.6–15.2)

## 2011-11-03 LAB — BASIC METABOLIC PANEL
CO2: 25 mEq/L (ref 19–32)
Chloride: 101 mEq/L (ref 96–112)
Creatinine, Ser: 1.04 mg/dL (ref 0.50–1.35)
GFR calc Af Amer: 88 mL/min — ABNORMAL LOW (ref >90)
Potassium: 3.8 mEq/L (ref 3.5–5.1)

## 2011-11-03 LAB — CBC WITH DIFFERENTIAL/PLATELET
Basophils Absolute: 0 10*3/uL (ref 0.0–0.1)
Basophils Relative: 0 % (ref 0–1)
HCT: 37.4 % — ABNORMAL LOW (ref 39.0–52.0)
Hemoglobin: 13.3 g/dL (ref 13.0–17.0)
Lymphocytes Relative: 28 % (ref 12–46)
Monocytes Absolute: 0.5 10*3/uL (ref 0.1–1.0)
Neutro Abs: 5.3 10*3/uL (ref 1.7–7.7)
Neutrophils Relative %: 62 % (ref 43–77)
RDW: 12.2 % (ref 11.5–15.5)
WBC: 8.5 10*3/uL (ref 4.0–10.5)

## 2011-11-03 MED ORDER — IOHEXOL 350 MG/ML SOLN
50.0000 mL | Freq: Once | INTRAVENOUS | Status: AC | PRN
  Administered 2011-11-03: 50 mL via INTRAVENOUS

## 2011-11-03 MED ORDER — SODIUM CHLORIDE 0.9 % IV BOLUS (SEPSIS)
1000.0000 mL | Freq: Once | INTRAVENOUS | Status: AC
  Administered 2011-11-03: 1000 mL via INTRAVENOUS

## 2011-11-03 MED ORDER — IOHEXOL 350 MG/ML SOLN
80.0000 mL | Freq: Once | INTRAVENOUS | Status: AC | PRN
  Administered 2011-11-03: 80 mL via INTRAVENOUS

## 2011-11-03 NOTE — Telephone Encounter (Signed)
Message copied by Benay Spice on Thu Nov 03, 2011  3:31 PM ------      Message from: Milas Gain      Created: Thu Nov 03, 2011  2:56 PM       Jan - Let Nathan Wallace know that his CTA head and neck looked ok, no narrowing of the arteries and no aneurysms.  However, incidentally they saw a small pulmonary embolism(you can say clot in his lung).  It may be old but I think he needs to go to the ED to have it evaluated, and possibly get started on a blood thinner.  Just have him tell them I sent him, and if you could call the charge nurse that would be great as well.  Just flip me a note back to confirm you have gotten this.  thx.            I have copied Dr. Jonny Ruiz as well to let him know.

## 2011-11-03 NOTE — ED Provider Notes (Signed)
History     CSN: [id number]  Arrival date & time 11/03/11  1617   First MD Initiated Contact with Patient 11/03/11 1650      Chief Complaint  Patient presents with  . Pulmonary Embolism     (Consider location/radiation/quality/duration/timing/severity/associated sxs/prior treatment) HPI  61 year old male with history of prostate cancer, CVA (intra, and hyperlipidemia presents with further management of a recently diagnosed pulmonary embolism.  Per nursing note, patient has recently received an CTA of head and neck to rule out for aneurysm. Other no evidence of aneurysm noted, incidentally a small PE were noted to right upper lobe.  Unsure if it's acute or chronic.  Pt was recommended to come to ER for further management.  Pt denies having any cp, sob, cough, hemoptysis, calf pain , or leg swelling.  Denies prior hx of PE.  Denies recent surgery, prolonged bed rest or long travel.  Denies fever, or chills.  Does not take any blood thinner medication.      Past Medical History  Diagnosis Date  . Prostate cancer   . Hyperlipidemia     border line  . GERD (gastroesophageal reflux disease)     NO RECENT PROBLEMS AND NO MEDS  . Arthritis     RT KNEE AND RT SHOULDER  . Impaired glucose tolerance 06/22/2011  . Syncope 06/23/2011  . Degenerative joint disease of knee, right   . Impaired glucose tolerance   . Benign head tremor   . Left inguinal hernia   . CVA (cerebrovascular accident due to intracerebral hemorrhage) 08/03/2011    Past Surgical History  Procedure Date  . Prostatectomy 04/2010    ROBOTIC -FOR CA  . Spine surgery 2003 OR 2004    CERVICAL FUSION-STATES LIMITED ROM NECK  . Wrist surgery   . Inguinal hernia repair 06/01/2011    Procedure: HERNIA REPAIR INGUINAL ADULT;  Surgeon: Ernestene Mention, MD;  Location: [facility];  Service: General;  Laterality: Left;  Left Inguinal Hernia Repair with Mesh  . Robot assisted laparoscopic radical prostatectomy jan 2012    da vinci     Family History  Problem Relation Age of Onset  . Hypertension Mother   . Cancer Father     colon/but cancer free as of now 02/10/11    History  Substance Use Topics  . Smoking status: Former Smoker    Quit date: 06/28/1966  . Smokeless tobacco: Never Used   Comment: QUIT SMOKING IN THE 70'S  . Alcohol Use: Yes     5th a liquor a week beer and vodka      Review of Systems  All other systems reviewed and are negative.    Allergies  Review of patient's allergies indicates no known allergies.  Home Medications   Current Outpatient Rx  Name Route Sig Dispense Refill  . ASPIRIN 81 MG PO TBEC Oral Take 1 tablet (81 mg total) by mouth daily. Swallow whole. 30 tablet 12  . ATORVASTATIN CALCIUM 40 MG PO TABS Oral Take 1 tablet (40 mg total) by mouth daily. 90 tablet 3  . GABAPENTIN 300 MG PO CAPS Oral Take 1-2 capsules (300-600 mg total) by mouth at bedtime as needed. for leg cramping. 60 capsule 3  . IRBESARTAN 150 MG PO TABS Oral Take 1 tablet (150 mg total) by mouth at bedtime. 90 tablet 3  . TADALAFIL 20 MG PO TABS Oral Take 1 tablet (20 mg total) by mouth daily as needed for erectile dysfunction. 10 tablet 11  BP 119/73  Pulse 62  Temp 98.5 F (36.9 C) (Oral)  Resp 19  SpO2 97%  Physical Exam  Nursing note and vitals reviewed. Constitutional: He appears well-developed and well-nourished. No distress.       Awake, alert, nontoxic appearance  HENT:  Head: Atraumatic.  Eyes: Conjunctivae are normal. Right eye exhibits no discharge. Left eye exhibits no discharge.  Neck: Normal range of motion. Neck supple.  Cardiovascular: Normal rate and regular rhythm.   Pulmonary/Chest: Effort normal. No respiratory distress. He exhibits no tenderness.  Abdominal: Soft. There is no tenderness. There is no rebound.  Musculoskeletal: He exhibits no edema and no tenderness.       ROM appears intact, no obvious focal weakness  Neurological: He is alert.  Skin: Skin is warm and  dry. No rash noted.  Psychiatric: He has a normal mood and affect.    ED Course  Procedures (including critical care time)  Labs Reviewed - No data to display Ct Angio Head W/cm &/or Wo Cm  11/03/2011  *RADIOLOGY REPORT*  Clinical Data:  Right neck and shoulder pain.  CVA.  CT ANGIOGRAPHY HEAD AND NECK  Technique:  Multidetector CT imaging of the head and neck was performed using the standard protocol during bolus administration of intravenous contrast.  Multiplanar CT image reconstructions including MIPs were obtained to evaluate the vascular anatomy. Carotid stenosis measurements (when applicable) are obtained utilizing NASCET criteria, using the distal internal carotid diameter as the denominator.  Contrast: 50mL OMNIPAQUE IOHEXOL 350 MG/ML SOLN,  Comparison:  MRI head 08/03/2011  CTA NECK  Findings:  ACDF C4-C7.  No acute bony abnormality or fracture. Lung apices are clear.  There is a small filling defect in a right upper lobe pulmonary artery compatible with pulmonary embolism. This appears to be a solitary small embolus and could be acute or chronic.  Correlation with symptoms is suggested as this may be asymptomatic.  Mild adenopathy in the neck.  Right level II lymph node measures 9 x 13 mm.  Several sub centimeter level II nodes on the left. Submandibular node on the right measures 9 x 13 mm.  Left submandibular node measures 9 x 15 mm, and 7 x 14 mm.  These may be reactive nodes.  Right carotid:  The right common carotid is widely patent. Bifurcation is widely patent.  No significant atherosclerotic disease.  Negative for dissection.  Left carotid:  The left carotid artery is widely patent without atherosclerotic disease or dissection.  Vertebral arteries:  Both vertebral arteries are patent to the basilar.  Fetal origin of the posterior cerebral arteries bilaterally.  For this reason, both vertebral arteries are relatively small.  No evidence of vertebral stenosis or dissection.   Review of the  MIP images confirms the above findings.  IMPRESSION: Small pulmonary embolism right upper lobe.  This may be acute or chronic.  Shotty cervical lymph nodes may be reactive.  Negative for carotid or vertebral artery stenosis or dissection.  CTA HEAD  Findings: Mild atrophy.  No acute infarct.  Negative for hemorrhage or mass.  No enhancing lesions are seen post contrast.  Both vertebral arteries are patent to the basilar.  Left PICA is patent.  Right PICA not visualized.  Basilar is patent.  Superior cerebellar and posterior cerebral arteries are patent bilaterally. Fetal origin of the posterior cerebral artery bilaterally. Hypoplastic P1 segment bilaterally.  Cavernous carotid is widely patent bilaterally.  Anterior and middle cerebral arteries are patent bilaterally.  Negative for  aneurysm.   Review of the MIP images confirms the above findings.  IMPRESSION: Negative  Original Report Authenticated By: Camelia Phenes, M.D.   Ct Angio Neck W/cm &/or Wo/cm  11/03/2011  *RADIOLOGY REPORT*  Clinical Data:  Right neck and shoulder pain.  CVA.  CT ANGIOGRAPHY HEAD AND NECK  Technique:  Multidetector CT imaging of the head and neck was performed using the standard protocol during bolus administration of intravenous contrast.  Multiplanar CT image reconstructions including MIPs were obtained to evaluate the vascular anatomy. Carotid stenosis measurements (when applicable) are obtained utilizing NASCET criteria, using the distal internal carotid diameter as the denominator.  Contrast: 50mL OMNIPAQUE IOHEXOL 350 MG/ML SOLN,  Comparison:  MRI head 08/03/2011  CTA NECK  Findings:  ACDF C4-C7.  No acute bony abnormality or fracture. Lung apices are clear.  There is a small filling defect in a right upper lobe pulmonary artery compatible with pulmonary embolism. This appears to be a solitary small embolus and could be acute or chronic.  Correlation with symptoms is suggested as this may be asymptomatic.  Mild adenopathy in the  neck.  Right level II lymph node measures 9 x 13 mm.  Several sub centimeter level II nodes on the left. Submandibular node on the right measures 9 x 13 mm.  Left submandibular node measures 9 x 15 mm, and 7 x 14 mm.  These may be reactive nodes.  Right carotid:  The right common carotid is widely patent. Bifurcation is widely patent.  No significant atherosclerotic disease.  Negative for dissection.  Left carotid:  The left carotid artery is widely patent without atherosclerotic disease or dissection.  Vertebral arteries:  Both vertebral arteries are patent to the basilar.  Fetal origin of the posterior cerebral arteries bilaterally.  For this reason, both vertebral arteries are relatively small.  No evidence of vertebral stenosis or dissection.   Review of the MIP images confirms the above findings.  IMPRESSION: Small pulmonary embolism right upper lobe.  This may be acute or chronic.  Shotty cervical lymph nodes may be reactive.  Negative for carotid or vertebral artery stenosis or dissection.  CTA HEAD  Findings: Mild atrophy.  No acute infarct.  Negative for hemorrhage or mass.  No enhancing lesions are seen post contrast.  Both vertebral arteries are patent to the basilar.  Left PICA is patent.  Right PICA not visualized.  Basilar is patent.  Superior cerebellar and posterior cerebral arteries are patent bilaterally. Fetal origin of the posterior cerebral artery bilaterally. Hypoplastic P1 segment bilaterally.  Cavernous carotid is widely patent bilaterally.  Anterior and middle cerebral arteries are patent bilaterally.  Negative for aneurysm.   Review of the MIP images confirms the above findings.  IMPRESSION: Negative  Original Report Authenticated By: Camelia Phenes, M.D.    Results for orders placed during the hospital encounter of 11/03/11  CBC WITH DIFFERENTIAL      Component Value Range   WBC 8.5  4.0 - 10.5 K/uL   RBC 3.75 (*) 4.22 - 5.81 MIL/uL   Hemoglobin 13.3  13.0 - 17.0 g/dL   HCT 40.9  (*) 81.1 - 52.0 %   MCV 99.7  78.0 - 100.0 fL   MCH 35.5 (*) 26.0 - 34.0 pg   MCHC 35.6  30.0 - 36.0 g/dL   RDW 91.4  78.2 - 95.6 %   Platelets 296  150 - 400 K/uL   Neutrophils Relative 62  43 - 77 %   Neutro  Abs 5.3  1.7 - 7.7 K/uL   Lymphocytes Relative 28  12 - 46 %   Lymphs Abs 2.4  0.7 - 4.0 K/uL   Monocytes Relative 6  3 - 12 %   Monocytes Absolute 0.5  0.1 - 1.0 K/uL   Eosinophils Relative 3  0 - 5 %   Eosinophils Absolute 0.2  0.0 - 0.7 K/uL   Basophils Relative 0  0 - 1 %   Basophils Absolute 0.0  0.0 - 0.1 K/uL  BASIC METABOLIC PANEL      Component Value Range   Sodium 137  135 - 145 mEq/L   Potassium 3.8  3.5 - 5.1 mEq/L   Chloride 101  96 - 112 mEq/L   CO2 25  19 - 32 mEq/L   Glucose, Bld 121 (*) 70 - 99 mg/dL   BUN 13  6 - 23 mg/dL   Creatinine, Ser 1.61  0.50 - 1.35 mg/dL   Calcium 9.7  8.4 - 09.6 mg/dL   GFR calc non Af Amer 76 (*) >90 mL/min   GFR calc Af Amer 88 (*) >90 mL/min   Ct Angio Head W/cm &/or Wo Cm  11/03/2011  *RADIOLOGY REPORT*  Clinical Data:  Right neck and shoulder pain.  CVA.  CT ANGIOGRAPHY HEAD AND NECK  Technique:  Multidetector CT imaging of the head and neck was performed using the standard protocol during bolus administration of intravenous contrast.  Multiplanar CT image reconstructions including MIPs were obtained to evaluate the vascular anatomy. Carotid stenosis measurements (when applicable) are obtained utilizing NASCET criteria, using the distal internal carotid diameter as the denominator.  Contrast: 50mL OMNIPAQUE IOHEXOL 350 MG/ML SOLN,  Comparison:  MRI head 08/03/2011  CTA NECK  Findings:  ACDF C4-C7.  No acute bony abnormality or fracture. Lung apices are clear.  There is a small filling defect in a right upper lobe pulmonary artery compatible with pulmonary embolism. This appears to be a solitary small embolus and could be acute or chronic.  Correlation with symptoms is suggested as this may be asymptomatic.  Mild adenopathy in the  neck.  Right level II lymph node measures 9 x 13 mm.  Several sub centimeter level II nodes on the left. Submandibular node on the right measures 9 x 13 mm.  Left submandibular node measures 9 x 15 mm, and 7 x 14 mm.  These may be reactive nodes.  Right carotid:  The right common carotid is widely patent. Bifurcation is widely patent.  No significant atherosclerotic disease.  Negative for dissection.  Left carotid:  The left carotid artery is widely patent without atherosclerotic disease or dissection.  Vertebral arteries:  Both vertebral arteries are patent to the basilar.  Fetal origin of the posterior cerebral arteries bilaterally.  For this reason, both vertebral arteries are relatively small.  No evidence of vertebral stenosis or dissection.   Review of the MIP images confirms the above findings.  IMPRESSION: Small pulmonary embolism right upper lobe.  This may be acute or chronic.  Shotty cervical lymph nodes may be reactive.  Negative for carotid or vertebral artery stenosis or dissection.  CTA HEAD  Findings: Mild atrophy.  No acute infarct.  Negative for hemorrhage or mass.  No enhancing lesions are seen post contrast.  Both vertebral arteries are patent to the basilar.  Left PICA is patent.  Right PICA not visualized.  Basilar is patent.  Superior cerebellar and posterior cerebral arteries are patent bilaterally. Fetal origin of the  posterior cerebral artery bilaterally. Hypoplastic P1 segment bilaterally.  Cavernous carotid is widely patent bilaterally.  Anterior and middle cerebral arteries are patent bilaterally.  Negative for aneurysm.   Review of the MIP images confirms the above findings.  IMPRESSION: Negative  Original Report Authenticated By: Camelia Phenes, M.D.   Ct Angio Neck W/cm &/or Wo/cm  11/03/2011  *RADIOLOGY REPORT*  Clinical Data:  Right neck and shoulder pain.  CVA.  CT ANGIOGRAPHY HEAD AND NECK  Technique:  Multidetector CT imaging of the head and neck was performed using the  standard protocol during bolus administration of intravenous contrast.  Multiplanar CT image reconstructions including MIPs were obtained to evaluate the vascular anatomy. Carotid stenosis measurements (when applicable) are obtained utilizing NASCET criteria, using the distal internal carotid diameter as the denominator.  Contrast: 50mL OMNIPAQUE IOHEXOL 350 MG/ML SOLN,  Comparison:  MRI head 08/03/2011  CTA NECK  Findings:  ACDF C4-C7.  No acute bony abnormality or fracture. Lung apices are clear.  There is a small filling defect in a right upper lobe pulmonary artery compatible with pulmonary embolism. This appears to be a solitary small embolus and could be acute or chronic.  Correlation with symptoms is suggested as this may be asymptomatic.  Mild adenopathy in the neck.  Right level II lymph node measures 9 x 13 mm.  Several sub centimeter level II nodes on the left. Submandibular node on the right measures 9 x 13 mm.  Left submandibular node measures 9 x 15 mm, and 7 x 14 mm.  These may be reactive nodes.  Right carotid:  The right common carotid is widely patent. Bifurcation is widely patent.  No significant atherosclerotic disease.  Negative for dissection.  Left carotid:  The left carotid artery is widely patent without atherosclerotic disease or dissection.  Vertebral arteries:  Both vertebral arteries are patent to the basilar.  Fetal origin of the posterior cerebral arteries bilaterally.  For this reason, both vertebral arteries are relatively small.  No evidence of vertebral stenosis or dissection.   Review of the MIP images confirms the above findings.  IMPRESSION: Small pulmonary embolism right upper lobe.  This may be acute or chronic.  Shotty cervical lymph nodes may be reactive.  Negative for carotid or vertebral artery stenosis or dissection.  CTA HEAD  Findings: Mild atrophy.  No acute infarct.  Negative for hemorrhage or mass.  No enhancing lesions are seen post contrast.  Both vertebral  arteries are patent to the basilar.  Left PICA is patent.  Right PICA not visualized.  Basilar is patent.  Superior cerebellar and posterior cerebral arteries are patent bilaterally. Fetal origin of the posterior cerebral artery bilaterally. Hypoplastic P1 segment bilaterally.  Cavernous carotid is widely patent bilaterally.  Anterior and middle cerebral arteries are patent bilaterally.  Negative for aneurysm.   Review of the MIP images confirms the above findings.  IMPRESSION: Negative  Original Report Authenticated By: Camelia Phenes, M.D.   Ct Angio Chest W/cm &/or Wo Cm  11/03/2011  *RADIOLOGY REPORT*  Clinical Data: Evaluate for suspected pulmonary emboli.  No chest complaints.  CT ANGIOGRAPHY CHEST  Technique:  Multidetector CT imaging of the chest using the standard protocol during bolus administration of intravenous contrast. Multiplanar reconstructed images including MIPs were obtained and reviewed to evaluate the vascular anatomy.  Contrast: 80mL OMNIPAQUE IOHEXOL 350 MG/ML SOLN  Comparison: CTA head and neck 11/03/2011 earlier today.  Findings: Cardiomegaly.  No aortic dissection.  No evidence for pulmonary  embolic disease.  Nonpathologic hilar and mediastinal lymphadenopathy.    No significant coronary artery calcification. No pulmonary infiltrates or masses.  No pericardial or apparent pleural effusion.  No chest wall or thoracic abnormalities. Prior cervical fusion.  Visualized upper abdominal organs unremarkable. Incompletely visualized cystic left renal lesion, consider ultrasound follow-up.  Tiny hepatic cyst in the dome of the liver on the right too small to characterize.  IMPRESSION: No visible pulmonary embolic disease.  The apparent CTA Neck findings could have represented   artifactual flow within the right upper lobe pulmonary vessel, or represent incomplete filling of a pulmonary vein.  Original Report Authenticated By: Elsie Stain, M.D.     1. Evaluation to r/o PE.    MDM  Pt  were sent to ED due to incidental finding of PE to R upper lobe on recent Head/neck CTA to r/o aneurysm.  He is currently asymptomatic.  I discussed with my attending who recommend consulting radiologist.   I spoke with radiologist, Dr. Harl Pat Elicker, who recommend to only get a Chest CTA if pt's renal function is normal.  If pt has multiple PEs, then it will be worthwhile to anticoagulate pt and admit.  My attending is aware of plan.  Will check renal function.  Pt currently asymptomatic with stable normal VSS.     7:37 PM CHest CTA reviewed by me shows no evidence of PE.  The abnormal findings on head/neck CTA likely represents artifactual flow withing the R upper lobe pulmonary vessel according to radiologist's reading.  I explain result to patient, along with recommend [place] f/u to visualized L cystic renal lesion.  Pt agrees to f/u with PCP and with Dr. Modesto Charon for further care.  Pt currently asymptomatic and stable to be discharged.    Fayrene Helper, PA-C 11/03/11 1958

## 2011-11-03 NOTE — Telephone Encounter (Signed)
Spoke with the patient. Information given to him as directed by Dr. Modesto Charon below. The patient will go to [facility] ED. I then called and spoke with the charge nurse, Diane. She will be awaiting the patient's arrival.

## 2011-11-03 NOTE — ED Provider Notes (Signed)
Medical screening examination/treatment/procedure(s) were performed by non-physician practitioner and as supervising physician I was immediately available for consultation/collaboration.   Celene Kras, MD 11/03/11 2017

## 2011-11-08 ENCOUNTER — Ambulatory Visit (HOSPITAL_COMMUNITY)
Admission: RE | Admit: 2011-11-08 | Discharge: 2011-11-08 | Disposition: A | Discharge status: Home / Self Care | Payer: Medicare Other | Source: Ambulatory Visit | Attending: Neurology | Admitting: Neurology

## 2011-11-08 DIAGNOSIS — R569 Unspecified convulsions: Secondary | ICD-10-CM

## 2011-11-08 DIAGNOSIS — I679 Cerebrovascular disease, unspecified: Secondary | ICD-10-CM | POA: Insufficient documentation

## 2011-11-08 DIAGNOSIS — I639 Cerebral infarction, unspecified: Secondary | ICD-10-CM

## 2011-11-08 DIAGNOSIS — Z1389 Encounter for screening for other disorder: Secondary | ICD-10-CM | POA: Insufficient documentation

## 2011-11-09 NOTE — Procedures (Signed)
EEG# [id number]  This routine EEG was requested in this 61 year old man with a history of passing out and spells of numbness and tingling in his hands.  His medications include gabapentin.  The EEG was done with the patient awake and drowsy.  During periods of maximal wakefulness he had a 9-10 cps alpha rhythm of low amplitude that was symmetric.  Background activities were composed of low amplitude beta and some alpha activities that were symmetric.  There was noted intermittent delta range slowing in the anterior to mid temporal range bilaterally, sometimes occuring independently and sometimes bisynchronously.  It seemed to occur more often over the left temporal region.  Photic stimulation did not produce a driving response.  Hyperventilation was not performed.  The patient did become drowsy as characterized by alpha dropout, and the appearance of slower activities as well as vertex sharp waves.  Mainly left temporal wicket spikes were seen.  Clinical Interpretation:  This routine EEG done with the patient awake and drowsy is abnormal.  The bitemporal slowing worse over the left anterior to mid temporal region suggests an underlying area of neuronal dysfunction.  Lupita Raider Modesto Charon, MD [facility], [place]

## 2011-11-28 NOTE — Progress Notes (Signed)
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  Criteria for CIDP not met.  No evidence of cervical or lumbosacral radiculopathy.

## 2011-12-05 ENCOUNTER — Encounter: Payer: Self-pay | Admitting: Neurology

## 2011-12-13 ENCOUNTER — Encounter: Payer: Self-pay | Admitting: Neurology

## 2011-12-13 ENCOUNTER — Other Ambulatory Visit (INDEPENDENT_AMBULATORY_CARE_PROVIDER_SITE_OTHER): Payer: Medicare Other

## 2011-12-13 ENCOUNTER — Ambulatory Visit (INDEPENDENT_AMBULATORY_CARE_PROVIDER_SITE_OTHER): Payer: Medicare Other | Admitting: Neurology

## 2011-12-13 ENCOUNTER — Other Ambulatory Visit: Payer: Self-pay | Admitting: Neurology

## 2011-12-13 VITALS — BP 138/80 | HR 72 | Ht 71.0 in | Wt 165.0 lb

## 2011-12-13 DIAGNOSIS — G629 Polyneuropathy, unspecified: Secondary | ICD-10-CM | POA: Insufficient documentation

## 2011-12-13 DIAGNOSIS — R7309 Other abnormal glucose: Secondary | ICD-10-CM

## 2011-12-13 DIAGNOSIS — G609 Hereditary and idiopathic neuropathy, unspecified: Secondary | ICD-10-CM

## 2011-12-13 LAB — CBC WITH DIFFERENTIAL/PLATELET
Basophils Absolute: 0 10*3/uL (ref 0.0–0.1)
HCT: 43.2 % (ref 39.0–52.0)
Hemoglobin: 14.4 g/dL (ref 13.0–17.0)
Lymphs Abs: 2 10*3/uL (ref 0.7–4.0)
MCHC: 33.5 g/dL (ref 30.0–36.0)
MCV: 104.2 fl — ABNORMAL HIGH (ref 78.0–100.0)
Monocytes Absolute: 0.5 10*3/uL (ref 0.1–1.0)
Monocytes Relative: 6.2 % (ref 3.0–12.0)
Neutro Abs: 5 10*3/uL (ref 1.4–7.7)
Platelets: 307 10*3/uL (ref 150.0–400.0)
RDW: 13.1 % (ref 11.5–14.6)

## 2011-12-13 LAB — COMPREHENSIVE METABOLIC PANEL
ALT: 64 U/L — ABNORMAL HIGH (ref 0–53)
AST: 68 U/L — ABNORMAL HIGH (ref 0–37)
Albumin: 4.5 g/dL (ref 3.5–5.2)
Alkaline Phosphatase: 62 U/L (ref 39–117)
BUN: 19 mg/dL (ref 6–23)
Calcium: 9.4 mg/dL (ref 8.4–10.5)
Chloride: 101 mEq/L (ref 96–112)
Potassium: 4.2 mEq/L (ref 3.5–5.1)
Sodium: 139 mEq/L (ref 135–145)
Total Protein: 7.9 g/dL (ref 6.0–8.3)

## 2011-12-13 LAB — HEMOGLOBIN A1C: Hgb A1c MFr Bld: 5.9 % (ref 4.6–6.5)

## 2011-12-13 NOTE — Progress Notes (Signed)
Dear Dr. Jonny Ruiz,  I saw  Nathan Wallace back in [facility] for his problem with spells of passing out, numbness/tingling/cramping in hands and feet and .  As you may recall, he is a 61 y.o. year old male with a history of HLD, prostate cancer who has had two spells of passing out, one in January 2012 soon after his prostate surgery and another 6 months later. The first occurred in the shower, he was in pain, and he felt lightheaded and "almost went out". The second occurred six months later, was without warning. No signs of seizures, but he did fall down 28 stairs. Hurts his shoulder. Quick return to normal. No bowel, bladder loss, tongue biting. No other precipitating factors. No chest pain, sob, palpitations. He has had no other spells.  You ordered an MRI brain which showed a right caudate head infarction. You also ordered a carotid doppler, which I can't find in the system, but was reportedly normal. He has not had a Holter monitor.  He has many other complaints. He gets cramping in his bilateral UE worse on the right with numbness in his bilateral hands. He also gets shooting pains down his right leg and bilateral cramping in his legs at night.  He also has developed a mild head tremor that he does not notice. --------------------------------------------  At his last appt I ordered an EEG which revealed bitemporal slowing, worse on the left, with no IEDs.  CTA of the head and neck revealed no aneurysm -- or significant atherosclerotic disease.  However, it did show an incidental PE, but repeated CTA of the chest did not confirm this.  EMG/NCS did reveal a sensory more than motor neuropathy with demyelinating features, but not severe enough to meet criteria for CIDP.  There was no clear sign of carpal tunnel syndrome or radiculopathies.  I started him on gabapentin 300mg  qhs for his leg and hand cramping which has remitted.  He has had no further passing out spells.    Medical history,  social history, and family history were reviewed and have not changed since the last clinic visit.  Current Outpatient Prescriptions on File Prior to Visit  Medication Sig Dispense Refill  . aspirin 81 MG chewable tablet Chew 81 mg by mouth daily.      Marland Kitchen atorvastatin (LIPITOR) 40 MG tablet Take 40 mg by mouth daily.      Marland Kitchen gabapentin (NEURONTIN) 300 MG capsule Take 300-600 mg by mouth at bedtime as needed. For leg cramping.      . irbesartan (AVAPRO) 150 MG tablet Take 150 mg by mouth at bedtime.      . tadalafil (CIALIS) 20 MG tablet Take 1 tablet (20 mg total) by mouth daily as needed for erectile dysfunction.  10 tablet  11  . DISCONTD: tadalafil (CIALIS) 20 MG tablet Take 20 mg by mouth daily as needed. Erectile dysfunction.        No Known Allergies  ROS:  13 systems were reviewed and are notable for chronic left shoulder pain.  All other review of systems are unremarkable.  Exam: . Filed Vitals:   12/13/11 0831  BP: 138/80  Pulse: 72  Height: 5\' 11"  (1.803 m)  Weight: 165 lb (74.844 kg)    In general, well appearing older man.  Mental status:   The patient is oriented to person, place and time. Recent and remote memory are intact. Attention span and concentration are normal. Language including repetition, naming, following commands are intact.  Fund of knowledge of current and historical events, as well as vocabulary are normal.  Cranial Nerves: Pupils are equally round and reactive to light. Visual fields full to confrontation. Extraocular movements are intact without nystagmus. Facial sensation and muscles of mastication are intact. Muscles of facial expression are symmetric.  Tongue protrusion, uvula, palate midline.  Shoulder shrug intact  Motor:  Mildly decreased bulk but normal tone, no drift and 5/5 muscle strength bilaterally.  Reflexes:  Absent upper extremities, 2+ RLE, 1+ LLE, toes down.  Sensation:  present vibration at toes, but diminished.  Position intact.   Length dep loss of temperature.  Coordination:  Normal finger to nose  Gait:  Normal gait and station.  Romberg negative.  Impression/Recommendations:  1.  Spells of passing out - unknown etiology but have not recurred.  I do not think they were seizures.  If they recur again I would suggest a Holter monitor. 2.  Cramping in legs and hands and numbness and tingling - better with gabapentin.  Given EMG/NCS findings I am going to order peripheral neuropathy labs.  I don't think at this time his neuropathy is bad enough to warrant further treatment.  I am concerned a bit about his alcohol use as a possible cause.  In any case, if it worsens I recommended he follow up with you, and perhaps you would have him see Dr. Stacy Gardner in [place] as she did his EMG/NCS. 3.  Infarction on MRI - likely was asymptomatic.  Continue on aspirin 325mg .    The patient will follow up with yourself.  Lupita Raider Modesto Charon, MD [facility], [place]

## 2011-12-14 LAB — SEDIMENTATION RATE: Sed Rate: 14 mm/hr (ref 0–22)

## 2011-12-15 ENCOUNTER — Telehealth: Payer: Self-pay | Admitting: Internal Medicine

## 2011-12-15 DIAGNOSIS — E538 Deficiency of other specified B group vitamins: Secondary | ICD-10-CM | POA: Insufficient documentation

## 2011-12-15 LAB — SPEP & IFE WITH QIG
Albumin ELP: 58.1 % (ref 55.8–66.1)
Alpha-2-Globulin: 7.4 % (ref 7.1–11.8)
IgG (Immunoglobin G), Serum: 1640 mg/dL — ABNORMAL HIGH (ref 650–1600)
M-Spike, %: 0.76 g/dL
Total Protein, Serum Electrophoresis: 7.8 g/dL (ref 6.0–8.3)

## 2011-12-15 MED ORDER — CYANOCOBALAMIN 1000 MCG/ML IJ SOLN: 1000.0000 ug | INTRAMUSCULAR | Status: DC

## 2011-12-15 NOTE — Telephone Encounter (Signed)
Message copied by Corwin Levins on Thu Dec 15, 2011  5:39 PM ------      Message from: Milas Gain      Created: Wed Dec 14, 2011  9:32 PM       Jan - Let Mr. Cumbie know that his B12 level in his blood was quite low.  This may be causing the numbness and tingling in his legs as well as the cramping.  I would recommend he follow up with Dr. Jonny Ruiz as soon as he can to arrange B12 shots.            I am also copying Dr. Jonny Ruiz on this note so he will know as well.

## 2011-12-15 NOTE — Telephone Encounter (Signed)
Freddi Che to notify pt and/or family  To start B12 shots monthly at his convenience

## 2011-12-16 ENCOUNTER — Telehealth: Payer: Self-pay | Admitting: Internal Medicine

## 2011-12-16 DIAGNOSIS — D472 Monoclonal gammopathy: Secondary | ICD-10-CM

## 2011-12-16 LAB — METHYLMALONIC ACID, SERUM: Methylmalonic Acid, Quant: 0.15 umol/L (ref <0.40)

## 2011-12-16 NOTE — Telephone Encounter (Signed)
Ok for heme  Referral done

## 2011-12-16 NOTE — Telephone Encounter (Signed)
Message copied by Corwin Levins on Fri Dec 16, 2011  1:56 PM ------      Message from: Milas Gain      Created: Fri Dec 16, 2011  1:48 PM       Jan - Also let Mr. Oyama know that one of his other blood tests revealed an abnormal protein in his blood.  I don't think this is causing his neuropathy but he will also need to follow up with Dr. Jonny Ruiz about this.  Dr. Jonny Ruiz may want to send him to a blood doctor about this.            Dr. Jonny Ruiz - IgG kappa MGUS - not sure if you follow these yourself or send them to Heme.  Up to you.

## 2011-12-16 NOTE — Telephone Encounter (Signed)
Pt  Wife notified

## 2011-12-19 ENCOUNTER — Other Ambulatory Visit (INDEPENDENT_AMBULATORY_CARE_PROVIDER_SITE_OTHER): Payer: Medicare Other

## 2011-12-19 ENCOUNTER — Ambulatory Visit (INDEPENDENT_AMBULATORY_CARE_PROVIDER_SITE_OTHER): Payer: Medicare Other | Admitting: Internal Medicine

## 2011-12-19 ENCOUNTER — Encounter: Payer: Self-pay | Admitting: Internal Medicine

## 2011-12-19 VITALS — BP 140/90 | HR 70 | Temp 98.9°F | Ht 71.0 in | Wt 164.0 lb

## 2011-12-19 DIAGNOSIS — R7302 Impaired glucose tolerance (oral): Secondary | ICD-10-CM

## 2011-12-19 DIAGNOSIS — E538 Deficiency of other specified B group vitamins: Secondary | ICD-10-CM

## 2011-12-19 DIAGNOSIS — R7309 Other abnormal glucose: Secondary | ICD-10-CM

## 2011-12-19 DIAGNOSIS — Z23 Encounter for immunization: Secondary | ICD-10-CM

## 2011-12-19 DIAGNOSIS — E785 Hyperlipidemia, unspecified: Secondary | ICD-10-CM

## 2011-12-19 DIAGNOSIS — I10 Essential (primary) hypertension: Secondary | ICD-10-CM

## 2011-12-19 DIAGNOSIS — Z0000 Encounter for general adult medical examination without abnormal findings: Secondary | ICD-10-CM

## 2011-12-19 LAB — LIPID PANEL
HDL: 63.4 mg/dL (ref >39.00)
Triglycerides: 235 mg/dL — ABNORMAL HIGH (ref 0.0–149.0)
VLDL: 47 mg/dL — ABNORMAL HIGH (ref 0.0–40.0)

## 2011-12-19 LAB — HEPATIC FUNCTION PANEL
Bilirubin, Direct: 0.2 mg/dL (ref 0.0–0.3)
Total Bilirubin: 1.4 mg/dL — ABNORMAL HIGH (ref 0.3–1.2)
Total Protein: 7.7 g/dL (ref 6.0–8.3)

## 2011-12-19 MED ORDER — CYANOCOBALAMIN 1000 MCG/ML IJ SOLN
1000.0000 ug | Freq: Once | INTRAMUSCULAR | Status: AC
  Administered 2011-12-19: 1000 ug via INTRAMUSCULAR

## 2011-12-19 NOTE — Patient Instructions (Addendum)
You had the flu shot today You had the B12 shot today Please return monthly for the B12 shots here at Nurse Visit (no copay) The plan will be to do this for one year, but then try to stop and take the OTC B12 pills, then keep re-checking the B12 levels;  If the B12 goes down again you may need to continue the shots long term Continue all other medications as before, including the lipitor Please go to LAB in the Basement for the blood and/or urine tests to be done today You will be contacted by phone if any changes need to be made immediately.  Otherwise, you will receive a letter about your results with an explanation. Please return in 6 mo with Lab testing done 3-5 days before

## 2011-12-20 ENCOUNTER — Encounter: Payer: Self-pay | Admitting: Internal Medicine

## 2011-12-20 LAB — LDL CHOLESTEROL, DIRECT: Direct LDL: 87.9 mg/dL

## 2011-12-21 ENCOUNTER — Telehealth: Payer: Self-pay | Admitting: Oncology

## 2011-12-21 NOTE — Telephone Encounter (Signed)
C/D on 9/11 for appt 9/19

## 2011-12-21 NOTE — Telephone Encounter (Signed)
S/W pt in re NP appt 9/19 @ 10:30 w/ Dr. Gaylyn Rong.  Referring Dr. Oliver Barre  Dx- MGUS NP packet mailed,

## 2011-12-22 ENCOUNTER — Encounter: Payer: Self-pay | Admitting: Oncology

## 2011-12-22 DIAGNOSIS — D472 Monoclonal gammopathy: Secondary | ICD-10-CM | POA: Insufficient documentation

## 2011-12-25 ENCOUNTER — Encounter: Payer: Self-pay | Admitting: Internal Medicine

## 2011-12-25 NOTE — Assessment & Plan Note (Signed)
stable overall by hx and exam, most recent data reviewed with pt, and pt to continue medical treatment as before Lab Results  Component Value Date   LDLCALC 113* 11/09/2009

## 2011-12-25 NOTE — Assessment & Plan Note (Signed)
stable overall by hx and exam, most recent data reviewed with pt, and pt to continue medical treatment as before BP Readings from Last 3 Encounters:  12/19/11 140/90  12/13/11 138/80  11/03/11 141/90

## 2011-12-25 NOTE — Progress Notes (Signed)
Subjective:    Patient ID: Nathan Wallace, male    DOB: 12-07-1950, 61 y.o.   MRN: [id number]  HPI  Here to f/u; overall doing ok,  Pt denies chest pain, increased sob or doe, wheezing, orthopnea, PND, increased LE swelling, palpitations, dizziness or syncope.  Pt denies new neurological symptoms such as new headache, or facial or extremity weakness or numbness   Pt denies polydipsia, polyuria, or low sugar symptoms such as weakness or confusion improved with po intake.  Pt states overall good compliance with meds, trying to follow lower cholesterol, diabetic diet, wt overall stable but little exercise however.  Does have B12 deficiency, needs to start new tx.  Due for flu shot. Past Medical History  Diagnosis Date  . Prostate cancer   . Hyperlipidemia     border line  . GERD (gastroesophageal reflux disease)     NO RECENT PROBLEMS AND NO MEDS  . Arthritis     RT KNEE AND RT SHOULDER  . Impaired glucose tolerance 06/22/2011  . Syncope 06/23/2011  . Degenerative joint disease of knee, right   . Impaired glucose tolerance   . Benign head tremor   . Left inguinal hernia   . CVA (cerebrovascular accident due to intracerebral hemorrhage) 08/03/2011  . MGUS (monoclonal gammopathy of unknown significance)   . Syncope 2012    after prostate surgery; neg neurology work up.    Past Surgical History  Procedure Date  . Prostatectomy 04/2010    ROBOTIC -FOR CA  . Spine surgery 2003 OR 2004    CERVICAL FUSION-STATES LIMITED ROM NECK  . Wrist surgery   . Inguinal hernia repair 06/01/2011    Procedure: HERNIA REPAIR INGUINAL ADULT;  Surgeon: Ernestene Mention, MD;  Location: [facility];  Service: General;  Laterality: Left;  Left Inguinal Hernia Repair with Mesh  . Robot assisted laparoscopic radical prostatectomy jan 2012    da vinci    reports that he quit smoking about 45 years ago. He has never used smokeless tobacco. He reports that he drinks alcohol. He reports that he does not use illicit  drugs. family history includes Cancer in his father and Hypertension in his mother. No Known Allergies Current Outpatient Prescriptions on File Prior to Visit  Medication Sig Dispense Refill  . aspirin 81 MG chewable tablet Chew 81 mg by mouth daily.      Marland Kitchen atorvastatin (LIPITOR) 40 MG tablet Take 40 mg by mouth daily.      . cyanocobalamin (,VITAMIN B-12,) 1000 MCG/ML injection Inject 1 mL (1,000 mcg total) into the muscle every 30 (thirty) days.  1 mL  11  . gabapentin (NEURONTIN) 300 MG capsule Take 300-600 mg by mouth at bedtime as needed. For leg cramping.      . irbesartan (AVAPRO) 150 MG tablet Take 150 mg by mouth at bedtime.      . tadalafil (CIALIS) 20 MG tablet Take 1 tablet (20 mg total) by mouth daily as needed for erectile dysfunction.  10 tablet  11   Review of Systems  Constitutional: Negative for diaphoresis and unexpected weight change.  HENT: Negative for tinnitus.   Eyes: Negative for photophobia and visual disturbance.  Respiratory: Negative for choking and stridor.   Gastrointestinal: Negative for vomiting and blood in stool.  Genitourinary: Negative for hematuria and decreased urine volume.  Musculoskeletal: Negative for gait problem.  Skin: Negative for color change and wound.  Neurological: Negative for tremors and numbness.  Psychiatric/Behavioral: Negative for decreased concentration. The  patient is not hyperactive.       Objective:   Physical Exam BP 140/90  Pulse 70  Temp 98.9 F (37.2 C) (Oral)  Ht 5\' 11"  (1.803 m)  Wt 164 lb (74.39 kg)  BMI 22.87 kg/m2  SpO2 97% Physical Exam  VS noted Constitutional: Pt appears well-developed and well-nourished.  HENT: Head: Normocephalic.  Right Ear: External ear normal.  Left Ear: External ear normal.  Eyes: Conjunctivae and EOM are normal. Pupils are equal, round, and reactive to light.  Neck: Normal range of motion. Neck supple.  Cardiovascular: Normal rate and regular rhythm.   Pulmonary/Chest: Effort  normal and breath sounds normal.  Abd:  Soft, NT, non-distended, + BS Neurological: Pt is alert. Not confused  Skin: Skin is warm. No erythema.  Psychiatric: Pt behavior is normal. Thought content normal.     Assessment & Plan:

## 2011-12-25 NOTE — Assessment & Plan Note (Signed)
stable overall by hx and exam, most recent data reviewed with pt, and pt to continue medical treatment as before Lab Results  Component Value Date   HGBA1C 5.9 12/13/2011    

## 2011-12-25 NOTE — Assessment & Plan Note (Signed)
To start B12 shots today for at least 1 yr, then trial off on oral

## 2011-12-29 ENCOUNTER — Telehealth: Payer: Self-pay | Admitting: Oncology

## 2011-12-29 ENCOUNTER — Encounter: Payer: Self-pay | Admitting: Oncology

## 2011-12-29 ENCOUNTER — Ambulatory Visit (HOSPITAL_BASED_OUTPATIENT_CLINIC_OR_DEPARTMENT_OTHER): Payer: Medicare Other | Admitting: Oncology

## 2011-12-29 ENCOUNTER — Other Ambulatory Visit: Payer: Medicare Other | Admitting: Lab

## 2011-12-29 ENCOUNTER — Ambulatory Visit: Payer: Medicare Other

## 2011-12-29 VITALS — BP 146/94 | HR 68 | Temp 97.1°F | Resp 20 | Ht 71.0 in | Wt 165.8 lb

## 2011-12-29 DIAGNOSIS — G5690 Unspecified mononeuropathy of unspecified upper limb: Secondary | ICD-10-CM

## 2011-12-29 DIAGNOSIS — D472 Monoclonal gammopathy: Secondary | ICD-10-CM

## 2011-12-29 DIAGNOSIS — I10 Essential (primary) hypertension: Secondary | ICD-10-CM

## 2011-12-29 DIAGNOSIS — E785 Hyperlipidemia, unspecified: Secondary | ICD-10-CM

## 2011-12-29 NOTE — Progress Notes (Signed)
Checked in new pt w/ no financial concerns. °

## 2011-12-29 NOTE — Progress Notes (Signed)
[facility]  [phone] [phone]     INITIAL HEMATOLOGY CONSULTATION    Referral MD:  Dr. Corwin Levins, M.D.  Reason for Referral: Numbness and tingling in his fingers in addition to positive with her M spike on SPEP.     HPI: Mr. Nathan Wallace is a 61 year old African American man with past medical history notable for prostate cancer in 2011; CVA, hyperlipidemia. He also had cervical disc disease and underwent with Dr. Jordan Likes in early 2000's with cervical neck fusion. He has been in usual state of health until about 5 months ago when he developed progressive bilateral numbness of the hands. Dr. Jonny Ruiz evaluated the patient and sent for SPEP which was positive for monoclonal M spike at 0.7 mg/dL. Therefore, he was kindly referred to the cancer Center for evaluation.  Nathan Wallace presented clinic for the first time today by himself. He reported feeling relatively well. He enjoys going out and fishing 3 times a week. However he noticed that when he grasps items or performs repetitive activities with his hands, he develops numbness and tingling in his fingers in both hands. However he also has a shooting sensation radiating from the base of the neck to both hands bilaterally. When he raises arms up, he has numbness sensation in the same area.  Patient denies fever, anorexia, weight loss, fatigue, headache, visual changes, confusion, drenching night sweats, palpable lymph node swelling, mucositis, odynophagia, dysphagia, nausea vomiting, jaundice, chest pain, palpitation, shortness of breath, dyspnea on exertion, productive cough, gum bleeding, epistaxis, hematemesis, hemoptysis, abdominal pain, abdominal swelling, early satiety, melena, hematochezia, hematuria, skin rash, spontaneous bleeding, joint swelling, joint pain, heat or cold intolerance, bowel bladder incontinence, back pain, focal motor weakness, depression, suicidal or homicidal ideation, feeling  hopelessness.   Past Medical History  Diagnosis Date  . Prostate cancer 2011  . Hyperlipidemia     border line  . GERD (gastroesophageal reflux disease)     NO RECENT PROBLEMS AND NO MEDS  . Arthritis     RT KNEE AND RT SHOULDER  . Impaired glucose tolerance 06/22/2011  . Degenerative joint disease of knee, right   . Benign head tremor   . Left inguinal hernia   . CVA (cerebrovascular accident due to intracerebral hemorrhage) 08/03/2011  . MGUS (monoclonal gammopathy of unknown significance)   . Syncope 2012    after prostate surgery; neg neurology work up.   :    Past Surgical History  Procedure Date  . Spine surgery 2003 OR 2004    CERVICAL FUSION-STATES LIMITED ROM NECK  . Wrist surgery   . Inguinal hernia repair 06/01/2011    Procedure: HERNIA REPAIR INGUINAL ADULT;  Surgeon: Ernestene Mention, MD;  Location: [facility];  Service: General;  Laterality: Left;  Left Inguinal Hernia Repair with Mesh  . Robot assisted laparoscopic radical prostatectomy jan 2012    da vinci  :   CURRENT MEDS: Current Outpatient Prescriptions  Medication Sig Dispense Refill  . aspirin 81 MG chewable tablet Chew 81 mg by mouth daily.      Marland Kitchen atorvastatin (LIPITOR) 40 MG tablet Take 40 mg by mouth daily.      . cyanocobalamin (,VITAMIN B-12,) 1000 MCG/ML injection Inject 1 mL (1,000 mcg total) into the muscle every 30 (thirty) days.  1 mL  11  . gabapentin (NEURONTIN) 300 MG capsule Take 300-600 mg by mouth at bedtime as needed. For leg cramping.      Marland Kitchen  irbesartan (AVAPRO) 150 MG tablet Take 150 mg by mouth at bedtime.      . tadalafil (CIALIS) 20 MG tablet Take 1 tablet (20 mg total) by mouth daily as needed for erectile dysfunction.  10 tablet  11      No Known Allergies:  Family History  Problem Relation Age of Onset  . Hypertension Mother   . Aneurysm Mother     brain  . Cancer Father     colon  . Heart attack Brother   :  History   Social History  . Marital Status: Married     Spouse Name: N/A    Number of Children: 3  . Years of Education: N/A   Occupational History  .      retired Acupuncturist.    Social History Main Topics  . Smoking status: Former Smoker    Quit date: 06/28/1966  . Smokeless tobacco: Never Used   Comment: QUIT SMOKING IN THE 70'S  . Alcohol Use: 3.0 oz/week    5 Cans of beer per week     5th a liquor a week beer and vodka  . Drug Use: No  . Sexually Active: Not on file   Other Topics Concern  . Not on file   Social History Narrative  . No narrative on file  :  REVIEW OF SYSTEM:  The rest of the 14-point review of sytem was negative.   Exam: ECOG 0-1  General:  well-nourished man, in no acute distress.  Eyes:  no scleral icterus.  ENT:  There were no oropharyngeal lesions.  Neck was without thyromegaly.  Lymphatics:  Negative cervical, supraclavicular or axillary adenopathy.  Respiratory: lungs were clear bilaterally without wheezing or crackles.  Cardiovascular:  Regular rate and rhythm, S1/S2, without murmur, rub or gallop.  There was no pedal edema.  GI:  abdomen was soft, flat, nontender, nondistended, without organomegaly.  Muscoloskeletal:  no spinal tenderness of palpation of vertebral spine.  Skin exam was without echymosis, petichae.  Neuro exam was nonfocal.  He developed pain and numbness in the tips of the fingers bilaterally when he had extreme range of motion of his bilateral arms. Patient was able to get on and off exam table without assistance.  Gait was normal.  Patient was alerted and oriented.  Attention was good.   Language was appropriate.  Mood was normal without depression.  Speech was not pressured.  Thought content was not tangential.    LABS:  Lab Results  Component Value Date   WBC 7.6 12/13/2011   HGB 14.4 12/13/2011   HCT 43.2 12/13/2011   PLT 307.0 12/13/2011   GLUCOSE 94 12/13/2011   CHOL 172 12/19/2011   TRIG 235.0* 12/19/2011   HDL 63.40 12/19/2011   LDLDIRECT 87.9 12/19/2011   LDLCALC 113* 11/09/2009    ALT 48 12/19/2011   AST 35 12/19/2011   NA 139 12/13/2011   K 4.2 12/13/2011   CL 101 12/13/2011   CREATININE 1.2 12/13/2011   BUN 19 12/13/2011   CO2 29 12/13/2011   PSA 0.05* 06/23/2011   INR 1.17 11/03/2011   HGBA1C 5.9 12/13/2011      ASSESSMENT AND PLAN:   1. Hyperlipidemia: He is on Lipitor per PCP. 2. Hypertension:  on Avapro per PCP. 3. Neuropathic pain in his bilateral hands: This is most likely consistent with radiculopathy from his clinical history and past medical history. I have very low suspicion for this as a result of plasma cell disorder.  I discussed the patient's to discuss with his PCP to see whether further workup such as MRI is indicated or a referral back to Dr. Jordan Likes as appropriate.  4.  Positive monoclonal gammopathy (abnormal protein)  *  possible causes: Monoclonal gammopathy of undetermined significance (MGUS), multiple myeloma or related diseases.  *  Work up:   I sent for Skeletal x-ray to rule out lytic lesions.   24 hour urine collection to determine amount of protein.  I recommended diagnostic one marrow biopsy to determine percentage of plasma cell.  4.  Treatment:  Pending work up.    *  If presence of lytic bone lesion or more than 10% plasma cells in bone marrow biopsy, we may consider chemotherapy.   *  If no lytic lesion or less than 10% plasma cells in bone marrow biopsy, and the diagnosis is best labeled as MGUS, and the recommendation will be observation.  5.  Follow up:  5 days after bone marrow biopsy. He cannot have the bone marrow biopsy done this month due to upcoming appointments. He would like to have it done in mid-October 2013.  Since he does not have in pending organ failure such as anemia, and hypercalcemia, or renal insufficiency; I agree with this request.   Thank you for this referral.  The length of time of the face-to-face encounter was . More than 50% of time was spent counseling and coordination of care.

## 2011-12-29 NOTE — Patient Instructions (Addendum)
1.  Positive monoclonal gammopathy (abnormal protein) 2.  possible causes: Monoclonal gammopathy of undetermined significance (MGUS), multiple myeloma or related diseases. 3.  Work up:    *  Skeletal x-ray to rule out lytic lesions.  *  24 hour urine collection to determine amount of protein.  *  Bone marrow biopsy to determine percentage of plasma cell.  4.  Treatment:  Pending work up.    *  If presence of lytic bone lesion or more than 10% plasma cells in bone marrow biopsy, we may consider chemotherapy.   *  If no lytic lesion or less than 10% plasma cells in bone marrow biopsy, and the diagnosis is best labeled as MGUS, and the recommendation will be observation.  5.  Follow up:  5 days after bone marrow biopsy.

## 2011-12-29 NOTE — Telephone Encounter (Signed)
gv pt appt schedule for October and bone survey appt for 9/24.

## 2011-12-30 LAB — KAPPA/LAMBDA LIGHT CHAINS
Kappa free light chain: 2.06 mg/dL — ABNORMAL HIGH (ref 0.33–1.94)
Lambda Free Lght Chn: 1.01 mg/dL (ref 0.57–2.63)

## 2012-01-02 ENCOUNTER — Telehealth: Payer: Self-pay | Admitting: Oncology

## 2012-01-02 NOTE — Telephone Encounter (Signed)
pt called to verify cx of bm bx on 9.26 verfied with cameo 10.16 appt is correct.    By Ernie Hew

## 2012-01-03 ENCOUNTER — Ambulatory Visit (HOSPITAL_COMMUNITY)
Admission: RE | Admit: 2012-01-03 | Discharge: 2012-01-03 | Disposition: A | Discharge status: Home / Self Care | Payer: Medicare Other | Source: Ambulatory Visit | Attending: Oncology | Admitting: Oncology

## 2012-01-03 DIAGNOSIS — D472 Monoclonal gammopathy: Secondary | ICD-10-CM

## 2012-01-03 DIAGNOSIS — C9000 Multiple myeloma not having achieved remission: Secondary | ICD-10-CM | POA: Insufficient documentation

## 2012-01-05 ENCOUNTER — Other Ambulatory Visit: Payer: Medicare Other | Admitting: Lab

## 2012-01-07 LAB — UIFE/LIGHT CHAINS/TP QN, 24-HR UR
Albumin, U: DETECTED
Free Lambda Excretion/Day: 9.88 mg/d
Free Lambda Lt Chains,Ur: 0.52 mg/dL (ref 0.02–0.67)
Free Lt Chn Excr Rate: 381.9 mg/d
Gamma Globulin, Urine: DETECTED — AB
Time: 24 hours
Total Protein, Urine-Ur/day: 407 mg/d — ABNORMAL HIGH (ref 10–140)
Volume, Urine: 1900 mL

## 2012-01-25 ENCOUNTER — Ambulatory Visit (HOSPITAL_BASED_OUTPATIENT_CLINIC_OR_DEPARTMENT_OTHER): Payer: Medicare Other | Admitting: Oncology

## 2012-01-25 ENCOUNTER — Other Ambulatory Visit: Payer: Self-pay | Admitting: Internal Medicine

## 2012-01-25 ENCOUNTER — Other Ambulatory Visit: Payer: Self-pay | Admitting: Oncology

## 2012-01-25 ENCOUNTER — Other Ambulatory Visit: Payer: Medicare Other | Admitting: Lab

## 2012-01-25 ENCOUNTER — Ambulatory Visit (INDEPENDENT_AMBULATORY_CARE_PROVIDER_SITE_OTHER): Payer: Medicare Other | Admitting: *Deleted

## 2012-01-25 ENCOUNTER — Other Ambulatory Visit (HOSPITAL_COMMUNITY)
Admission: RE | Admit: 2012-01-25 | Discharge: 2012-01-25 | Disposition: A | Discharge status: Home / Self Care | Payer: Medicare Other | Source: Ambulatory Visit | Attending: Oncology | Admitting: Oncology

## 2012-01-25 ENCOUNTER — Ambulatory Visit: Payer: Medicare Other | Admitting: Lab

## 2012-01-25 VITALS — BP 168/120

## 2012-01-25 VITALS — BP 172/106 | HR 55 | Temp 97.1°F | Resp 20

## 2012-01-25 DIAGNOSIS — D472 Monoclonal gammopathy: Secondary | ICD-10-CM

## 2012-01-25 DIAGNOSIS — E538 Deficiency of other specified B group vitamins: Secondary | ICD-10-CM

## 2012-01-25 DIAGNOSIS — I10 Essential (primary) hypertension: Secondary | ICD-10-CM

## 2012-01-25 LAB — DIFFERENTIAL
Basophils Absolute: 0 10*3/uL (ref 0.0–0.1)
Basophils Relative: 1 % (ref 0–1)
Eosinophils Absolute: 0.1 10*3/uL (ref 0.0–0.7)
Eosinophils Relative: 2 % (ref 0–5)
Lymphocytes Relative: 31 % (ref 12–46)
Monocytes Absolute: 0.5 10*3/uL (ref 0.1–1.0)

## 2012-01-25 LAB — CBC
MCHC: 35.3 g/dL (ref 30.0–36.0)
MCV: 98.3 fL (ref 78.0–100.0)
Platelets: 297 10*3/uL (ref 150–400)
RDW: 12.4 % (ref 11.5–15.5)
WBC: 6.6 10*3/uL (ref 4.0–10.5)

## 2012-01-25 MED ORDER — CYANOCOBALAMIN 1000 MCG/ML IJ SOLN
1000.0000 ug | Freq: Once | INTRAMUSCULAR | Status: AC
  Administered 2012-01-25: 1000 ug via INTRAMUSCULAR

## 2012-01-25 NOTE — Progress Notes (Signed)
See procedure note dated 01/25/2012

## 2012-01-25 NOTE — Progress Notes (Signed)
Pt here for nurse visit B 12 injection. After giving him injection. He advised me that he went to [facility] for a bone marrow biopsy prior to arriving at our office. He states his BP was elevated while there and they advised we recheck it here. I checked BP ( see vitals) and advised Dr. Jonny Ruiz it was abnormal. Obtained EKG which was reviewed by Dr. Jonny Ruiz and let pt leave as he was asymptomatic other than dizziness which he reports started after procedure.

## 2012-01-25 NOTE — Telephone Encounter (Signed)
ECG reviewed as per emr 

## 2012-01-25 NOTE — Patient Instructions (Addendum)
[facility] Discharge Instructions for Post Bone Marrow Procedure  Today you had a bone marrow biopsy and aspirate of Left Hip.   Please keep the pressure dressing in place for at least 24 hours.  Have someone check your dressing periodically for bleeding.  If needed you can reapply a pressure dressing to the site.  Take Tylenol as directed for pain.   IF BLEEDING REOCCURS THAT SHOULD BE REPORTED IMMEDIATELY. Call the Cancer Center at [phone] if during business hours. Or report to the Emergency Room.   I have been informed and understand all the instructions given to me. I know to contact the clinic, my physician, or go to the Emergency Department if any problems should occur. I do not have any questions at this time, but understand that I may call the clinic during office hours at [phone]  should I have any questions or need assistance in obtaining follow up care.    __________________________________________  _____________  __________ Signature of Patient or Authorized Representative            Date                   Time    __________________________________________ Nurse's Signature

## 2012-01-25 NOTE — Procedures (Signed)
   [facility]  [phone] Fax:([phone]   BONE MARROW BIOPSY AND ASPIRATION   INDICATION:  Positive monoclonal spike; rule out multiple myeloma.   Procedure: After obtained from consent, Shawn Carattini was placed in the prone position. Time out was performed verifying correct patient and procedure. The skin overlying the left posterior crest was prepped with Betadine and draped in the usual sterile fashion. The skin and periosteum were infiltrated with 20 mL of 2% lidocaine. A small puncture wound was made with #11 scalpel blade.  Bone marrow aspirate was obtained on the first pass of the aspiration needle.  One core biopsy was obtained through the same incision.   The aspirate was sent for routine histology, flow cytometry, and cytogenetics.  Core biopsy was sent for routine histology.   Ransom Nickson tolerated procedure well with minimal  blood loss and without immediate complication.   A sterile dressing was applied.   Jethro Bolus M.D. 01/25/2012

## 2012-01-25 NOTE — Progress Notes (Signed)
Pt was d/c'd from Treatment area w/ elevated BP ..  Dr. Gaylyn Rong aware and instructed pt to contact his PCP.  Pt states understanding and actually says he is going directly across the street to Dr. Fayrene Fearing office for his Vit B12 injection.  He will report his BP and request to have it checked again over there.

## 2012-01-26 ENCOUNTER — Telehealth: Payer: Self-pay | Admitting: Oncology

## 2012-01-26 ENCOUNTER — Other Ambulatory Visit: Payer: Self-pay | Admitting: Oncology

## 2012-01-26 NOTE — Telephone Encounter (Signed)
Pt lmonvm stating his appt for f/u 10/21 was too soon due to bx yesterday. Message forwarded to nurse.

## 2012-01-27 ENCOUNTER — Telehealth: Payer: Self-pay

## 2012-01-27 ENCOUNTER — Telehealth: Payer: Self-pay | Admitting: Oncology

## 2012-01-27 NOTE — Telephone Encounter (Signed)
l/m that 10/21 appt was moved to 11/7 per 10/17 pof    aom

## 2012-01-27 NOTE — Telephone Encounter (Signed)
Message copied by Kallie Locks on Fri Jan 27, 2012 10:50 AM ------      Message from: HA, Raliegh Ip T      Created: Thu Jan 26, 2012  4:26 PM      Regarding: RE: "Cancel Appoint?"      Contact: [phone]       Please tell him that I've turned in a POF for the schedulers to call him to reschedule.  Thanks.            ----- Message -----         From: Marcell Barlow, RN         Sent: 01/26/2012   4:13 PM           To: Exie Parody, MD      Subject: "Cancel Appoint?"                                        Patient states that he had bone marrow biopsy yesterday (01/25/12) and is scheduled for follow-up on Monday (01/30/12).  Will his results be back by then, or do we need to cancel his appointment for Monday?  Thanks.

## 2012-01-30 ENCOUNTER — Ambulatory Visit: Payer: Medicare Other | Admitting: Oncology

## 2012-02-15 NOTE — Patient Instructions (Addendum)
1.  Diagnosis:  You have 7% plasma cell on bone marrow biopsy and normal chromosome studies.  Your diagnosis is best labelled as  MGUS (monoclonal gammopathy of unknown significance).  About 1% per year of patients with MGUS evolve into active multiple myeloma (with >10% plasma cell, anemia, kidney failure, high calcium, lytic bone lesions:  You have none of this at this time).   2.  Recommendation:  Watchful observation with blood work every 4 months.  In the future, if you develop anemia, kidney failure, high calcium, lytic bone lesions, then a repeat bone marrow biopsy may be needed to ensure that you have not developed active multiple myeloma.

## 2012-02-16 ENCOUNTER — Telehealth: Payer: Self-pay | Admitting: Oncology

## 2012-02-16 ENCOUNTER — Ambulatory Visit (HOSPITAL_BASED_OUTPATIENT_CLINIC_OR_DEPARTMENT_OTHER): Payer: Medicare Other | Admitting: Oncology

## 2012-02-16 VITALS — BP 139/87 | HR 67 | Temp 97.8°F | Resp 20 | Ht 71.0 in | Wt 169.9 lb

## 2012-02-16 DIAGNOSIS — M79609 Pain in unspecified limb: Secondary | ICD-10-CM

## 2012-02-16 DIAGNOSIS — D472 Monoclonal gammopathy: Secondary | ICD-10-CM

## 2012-02-16 DIAGNOSIS — I10 Essential (primary) hypertension: Secondary | ICD-10-CM

## 2012-02-16 DIAGNOSIS — E785 Hyperlipidemia, unspecified: Secondary | ICD-10-CM

## 2012-02-16 NOTE — Progress Notes (Signed)
[facility]  [phone] Fax:(336) [phone]   OFFICE PROGRESS NOTE   Cc:  Oliver Barre, MD  DIAGNOSIS:  MGUS  CURRENT THERAPY: watchful observation.   INTERVAL HISTORY: Nathan Wallace 61 y.o. male returns for regular follow up by himself to go over result of work up.  He feels well. He is excited since he has a 3rd grandson.  He has pain in the left shoulder; and he is not able to raise his left arm above his shoulder. He denied numbness, tingling in the left hand.   Patient denies fever, anorexia, weight loss, fatigue, headache, visual changes, confusion, drenching night sweats, palpable lymph node swelling, mucositis, odynophagia, dysphagia, nausea vomiting, jaundice, chest pain, palpitation, shortness of breath, dyspnea on exertion, productive cough, gum bleeding, epistaxis, hematemesis, hemoptysis, abdominal pain, abdominal swelling, early satiety, melena, hematochezia, hematuria, skin rash, spontaneous bleeding, heat or cold intolerance, bowel bladder incontinence, back pain, focal motor weakness, paresthesia, depression, suicidal or homicidal ideation, feeling hopelessness.   Past Medical History  Diagnosis Date  . Prostate cancer 2011  . Hyperlipidemia     border line  . GERD (gastroesophageal reflux disease)     NO RECENT PROBLEMS AND NO MEDS  . Arthritis     RT KNEE AND RT SHOULDER  . Impaired glucose tolerance 06/22/2011  . Degenerative joint disease of knee, right   . Benign head tremor   . Left inguinal hernia   . CVA (cerebrovascular accident due to intracerebral hemorrhage) 08/03/2011  . MGUS (monoclonal gammopathy of unknown significance)   . Syncope 2012    after prostate surgery; neg neurology work up.     Past Surgical History  Procedure Date  . Spine surgery 2003 OR 2004    CERVICAL FUSION-STATES LIMITED ROM NECK  . Wrist surgery   . Inguinal hernia repair 06/01/2011    Procedure: HERNIA REPAIR INGUINAL ADULT;  Surgeon: Ernestene Mention, MD;  Location: [facility];  Service: General;  Laterality: Left;  Left Inguinal Hernia Repair with Mesh  . Robot assisted laparoscopic radical prostatectomy jan 2012    da vinci    Current Outpatient Prescriptions  Medication Sig Dispense Refill  . acetaminophen (TYLENOL) 325 MG tablet Take 650 mg by mouth every 6 (six) hours as needed.      Marland Kitchen aspirin 81 MG chewable tablet Chew 81 mg by mouth daily.      Marland Kitchen atorvastatin (LIPITOR) 40 MG tablet Take 40 mg by mouth daily.      . cyanocobalamin (,VITAMIN B-12,) 1000 MCG/ML injection Inject 1 mL (1,000 mcg total) into the muscle every 30 (thirty) days.  1 mL  11  . gabapentin (NEURONTIN) 300 MG capsule Take 300-600 mg by mouth at bedtime as needed. For leg cramping.      . irbesartan (AVAPRO) 150 MG tablet Take 150 mg by mouth at bedtime.      . tadalafil (CIALIS) 20 MG tablet Take 1 tablet (20 mg total) by mouth daily as needed for erectile dysfunction.  10 tablet  11    ALLERGIES:   has no known allergies.  REVIEW OF SYSTEMS:  The rest of the 14-point review of system was negative.   Filed Vitals:   02/16/12 1212  BP: 139/87  Pulse: 67  Temp: 97.8 F (36.6 C)  Resp: 20   Wt Readings from Last 3 Encounters:  02/16/12 169 lb 14.4 oz (77.066 kg)  12/29/11 165 lb 12.8 oz (75.206 kg)  12/19/11 164 lb (74.39  kg)   ECOG Performance status: 0  PHYSICAL EXAMINATION:   General:  well-nourished in no acute distress.  Eyes:  no scleral icterus.  ENT:  There were no oropharyngeal lesions.  Neck was without thyromegaly.  Lymphatics:  Negative cervical, supraclavicular or axillary adenopathy.  Respiratory: lungs were clear bilaterally without wheezing or crackles.  Cardiovascular:  Regular rate and rhythm, S1/S2, without murmur, rub or gallop.  There was no pedal edema.  GI:  abdomen was soft, flat, nontender, nondistended, without organomegaly.  Muscoloskeletal:  no spinal tenderness of palpation of vertebral spine.  Skin exam was without  echymosis, petichae.  Neuro exam was nonfocal.  Patient was able to get on and off exam table without assistance.  Gait was normal.  Patient was alerted and oriented.  Attention was good.   Language was appropriate.  Mood was normal without depression.  Speech was not pressured.  Thought content was not tangential.     LABORATORY/RADIOLOGY DATA:  Lab Results  Component Value Date   WBC 6.6 01/25/2012   HGB 14.0 01/25/2012   HCT 39.7 01/25/2012   PLT 297 01/25/2012   GLUCOSE 94 12/13/2011   CHOL 172 12/19/2011   TRIG 235.0* 12/19/2011   HDL 63.40 12/19/2011   LDLDIRECT 87.9 12/19/2011   LDLCALC 113* 11/09/2009   ALKPHOS 56 12/19/2011   ALT 48 12/19/2011   AST 35 12/19/2011   NA 139 12/13/2011   K 4.2 12/13/2011   CL 101 12/13/2011   CREATININE 1.2 12/13/2011   BUN 19 12/13/2011   CO2 29 12/13/2011   PSA 0.05* 06/23/2011   INR 1.17 11/03/2011   HGBA1C 5.9 12/13/2011    ASSESSMENT AND PLAN:   1. Hyperlipidemia: He is on Lipitor per PCP. 2. Hypertension: on Avapro per PCP. 3. Neuropathic pain in his bilateral hands: This is most likely consistent with radiculopathy.  I advised him to f/u with his PCP for further eval and treatment.  4. Positive monoclonal gammopathy  -  Diagnosis:  He has 7% plasma cell on bone marrow biopsy and normal chromosome studies.  His diagnosis is best labelled as  MGUS (monoclonal gammopathy of unknown significance).  About 1% per year of patients with MGUS evolve into active multiple myeloma (with >10% plasma cell, anemia, kidney failure, high calcium, lytic bone lesions:  He has none of this at this time).   - Recommendation:  Watchful observation with blood work every 4 months.  In the future, if he develops anemia, kidney failure, high calcium, lytic bone lesions, then a repeat bone marrow biopsy may be needed to ensure that he has not progressed to active multiple myeloma.    The length of time of the face-to-face encounter was 15 minutes. More than 50% of time was spent  counseling and coordination of care.

## 2012-02-16 NOTE — Telephone Encounter (Signed)
gv and printed appt schedule for pt for March 2014 ° °

## 2012-02-23 ENCOUNTER — Encounter (HOSPITAL_COMMUNITY): Payer: Self-pay | Admitting: Emergency Medicine

## 2012-02-23 ENCOUNTER — Ambulatory Visit (INDEPENDENT_AMBULATORY_CARE_PROVIDER_SITE_OTHER): Payer: Medicare Other

## 2012-02-23 ENCOUNTER — Emergency Department (HOSPITAL_COMMUNITY)
Admission: EM | Admit: 2012-02-23 | Discharge: 2012-02-24 | Disposition: A | Discharge status: Home / Self Care | Payer: Medicare Other | Attending: Emergency Medicine | Admitting: Emergency Medicine

## 2012-02-23 ENCOUNTER — Emergency Department (HOSPITAL_COMMUNITY): Payer: Medicare Other

## 2012-02-23 DIAGNOSIS — R220 Localized swelling, mass and lump, head: Secondary | ICD-10-CM | POA: Insufficient documentation

## 2012-02-23 DIAGNOSIS — F1010 Alcohol abuse, uncomplicated: Secondary | ICD-10-CM | POA: Insufficient documentation

## 2012-02-23 DIAGNOSIS — E785 Hyperlipidemia, unspecified: Secondary | ICD-10-CM | POA: Insufficient documentation

## 2012-02-23 DIAGNOSIS — W19XXXA Unspecified fall, initial encounter: Secondary | ICD-10-CM

## 2012-02-23 DIAGNOSIS — Y929 Unspecified place or not applicable: Secondary | ICD-10-CM | POA: Insufficient documentation

## 2012-02-23 DIAGNOSIS — Z8719 Personal history of other diseases of the digestive system: Secondary | ICD-10-CM | POA: Insufficient documentation

## 2012-02-23 DIAGNOSIS — Z8739 Personal history of other diseases of the musculoskeletal system and connective tissue: Secondary | ICD-10-CM | POA: Insufficient documentation

## 2012-02-23 DIAGNOSIS — E876 Hypokalemia: Secondary | ICD-10-CM | POA: Insufficient documentation

## 2012-02-23 DIAGNOSIS — Z87891 Personal history of nicotine dependence: Secondary | ICD-10-CM | POA: Insufficient documentation

## 2012-02-23 DIAGNOSIS — W1789XA Other fall from one level to another, initial encounter: Secondary | ICD-10-CM | POA: Insufficient documentation

## 2012-02-23 DIAGNOSIS — Z7982 Long term (current) use of aspirin: Secondary | ICD-10-CM | POA: Insufficient documentation

## 2012-02-23 DIAGNOSIS — Z79899 Other long term (current) drug therapy: Secondary | ICD-10-CM | POA: Insufficient documentation

## 2012-02-23 DIAGNOSIS — IMO0002 Reserved for concepts with insufficient information to code with codable children: Secondary | ICD-10-CM | POA: Insufficient documentation

## 2012-02-23 DIAGNOSIS — K219 Gastro-esophageal reflux disease without esophagitis: Secondary | ICD-10-CM | POA: Insufficient documentation

## 2012-02-23 DIAGNOSIS — E538 Deficiency of other specified B group vitamins: Secondary | ICD-10-CM

## 2012-02-23 DIAGNOSIS — Y939 Activity, unspecified: Secondary | ICD-10-CM | POA: Insufficient documentation

## 2012-02-23 DIAGNOSIS — R7309 Other abnormal glucose: Secondary | ICD-10-CM | POA: Insufficient documentation

## 2012-02-23 DIAGNOSIS — Z8546 Personal history of malignant neoplasm of prostate: Secondary | ICD-10-CM | POA: Insufficient documentation

## 2012-02-23 DIAGNOSIS — Z8673 Personal history of transient ischemic attack (TIA), and cerebral infarction without residual deficits: Secondary | ICD-10-CM | POA: Insufficient documentation

## 2012-02-23 LAB — POCT I-STAT, CHEM 8
Calcium, Ion: 1.13 mmol/L (ref 1.13–1.30)
Chloride: 107 mEq/L (ref 96–112)
Glucose, Bld: 113 mg/dL — ABNORMAL HIGH (ref 70–99)
HCT: 34 % — ABNORMAL LOW (ref 39.0–52.0)
Hemoglobin: 11.6 g/dL — ABNORMAL LOW (ref 13.0–17.0)
TCO2: 21 mmol/L (ref 0–100)

## 2012-02-23 LAB — ETHANOL: Alcohol, Ethyl (B): 239 mg/dL — ABNORMAL HIGH (ref 0–11)

## 2012-02-23 MED ORDER — POTASSIUM CHLORIDE CRYS ER 20 MEQ PO TBCR
40.0000 meq | EXTENDED_RELEASE_TABLET | Freq: Once | ORAL | Status: AC
  Administered 2012-02-23: 40 meq via ORAL
  Filled 2012-02-23: qty 2

## 2012-02-23 MED ORDER — CYANOCOBALAMIN 1000 MCG/ML IJ SOLN
1000.0000 ug | Freq: Once | INTRAMUSCULAR | Status: AC
  Administered 2012-02-23: 1000 ug via INTRAMUSCULAR

## 2012-02-23 MED ORDER — SODIUM CHLORIDE 0.9 % IV BOLUS (SEPSIS)
1000.0000 mL | Freq: Once | INTRAVENOUS | Status: AC
  Administered 2012-02-23: 1000 mL via INTRAVENOUS

## 2012-02-23 MED ORDER — TETANUS-DIPHTH-ACELL PERTUSSIS 5-2.5-18.5 LF-MCG/0.5 IM SUSP
0.5000 mL | Freq: Once | INTRAMUSCULAR | Status: AC
  Administered 2012-02-23: 0.5 mL via INTRAMUSCULAR
  Filled 2012-02-23: qty 0.5

## 2012-02-23 NOTE — ED Notes (Signed)
[phone]-  WIFE TEL/ NUMBER.

## 2012-02-23 NOTE — ED Notes (Signed)
PT. ARRIVED WITH EMS FROM HOME , FELL AT HOME THIS EVENING , NO LOC , STATES DRANK VODKA THIS EVENING , PRESENTS WITH UPPER LIP SWELLING , ALERT AND ORIENTED , SLIGHT HEADACHE / CHRONIC RIGHT KNEE PAIN WITH SWELLING .

## 2012-02-23 NOTE — ED Provider Notes (Signed)
History     CSN: [id number]  Arrival date & time 02/23/12  2148   First MD Initiated Contact with Patient 02/23/12 2201      Chief Complaint  Patient presents with  . Fall    (Consider location/radiation/quality/duration/timing/severity/associated sxs/prior treatment) Patient is a 61 y.o. male presenting with fall. The history is provided by the patient. The history is limited by the condition of the patient.  Fall The accident occurred 3 to 5 hours ago. The fall occurred in unknown circumstances (Pt states he doesnt believe he fell). He fell from an unknown height. Point of impact: pt does not know, no wittness present, swlling of upper lip and left eye. The pain is at a severity of 0/10. The patient is experiencing no pain. He was ambulatory at the scene. There was no entrapment after the fall. There was no drug use involved in the accident. There was alcohol use involved in the accident. Pertinent negatives include no visual change, no fever, no numbness, no abdominal pain, no bowel incontinence, no nausea, no vomiting, no hematuria, no headaches, no hearing loss, no loss of consciousness and no tingling. Associated symptoms comments: Pt not reliable historian . He has tried nothing for the symptoms.    Past Medical History  Diagnosis Date  . Prostate cancer 2011  . Hyperlipidemia     border line  . GERD (gastroesophageal reflux disease)     NO RECENT PROBLEMS AND NO MEDS  . Arthritis     RT KNEE AND RT SHOULDER  . Impaired glucose tolerance 06/22/2011  . Degenerative joint disease of knee, right   . Benign head tremor   . Left inguinal hernia   . CVA (cerebrovascular accident due to intracerebral hemorrhage) 08/03/2011  . MGUS (monoclonal gammopathy of unknown significance)   . Syncope 2012    after prostate surgery; neg neurology work up.     Past Surgical History  Procedure Date  . Spine surgery 2003 OR 2004    CERVICAL FUSION-STATES LIMITED ROM NECK  . Wrist surgery     . Inguinal hernia repair 06/01/2011    Procedure: HERNIA REPAIR INGUINAL ADULT;  Surgeon: Ernestene Mention, MD;  Location: [facility];  Service: General;  Laterality: Left;  Left Inguinal Hernia Repair with Mesh  . Robot assisted laparoscopic radical prostatectomy jan 2012    da vinci    Family History  Problem Relation Age of Onset  . Hypertension Mother   . Aneurysm Mother     brain  . Cancer Father     colon  . Heart attack Brother     History  Substance Use Topics  . Smoking status: Former Smoker    Quit date: 06/28/1966  . Smokeless tobacco: Never Used     Comment: QUIT SMOKING IN THE 70'S  . Alcohol Use: 3.0 oz/week    5 Cans of beer per week     Comment: 5th a liquor a week beer and vodka      Review of Systems  Unable to perform ROS Constitutional: Negative for fever.  Gastrointestinal: Negative for nausea, vomiting, abdominal pain and bowel incontinence.  Genitourinary: Negative for hematuria.  Neurological: Negative for tingling, loss of consciousness, numbness and headaches.    Allergies  Review of patient's allergies indicates no known allergies.  Home Medications   Current Outpatient Rx  Name  Route  Sig  Dispense  Refill  . ACETAMINOPHEN 325 MG PO TABS   Oral   Take 650 mg by  mouth every 6 (six) hours as needed. For pain         . ASPIRIN 81 MG PO CHEW   Oral   Chew 81 mg by mouth daily.         . ATORVASTATIN CALCIUM 40 MG PO TABS   Oral   Take 40 mg by mouth daily.         . CYANOCOBALAMIN 1000 MCG/ML IJ SOLN   Intramuscular   Inject 1,000 mcg into the muscle every 30 (thirty) days.         Marland Kitchen GABAPENTIN 300 MG PO CAPS   Oral   Take 300-600 mg by mouth at bedtime as needed. For leg cramping.         . IRBESARTAN 150 MG PO TABS   Oral   Take 150 mg by mouth at bedtime.           BP 103/66  Pulse 68  Temp 98.5 F (36.9 C) (Oral)  Resp 13  SpO2 92%  Physical Exam  Nursing note and vitals reviewed. Constitutional: He  is oriented to person, place, and time. He appears well-developed and well-nourished. No distress.       Fruity breath  HENT:  Head: Normocephalic.       Swelling of upper lip w superficial abrasion  Eyes: Conjunctivae normal are normal. Pupils are equal, round, and reactive to light.       No entrapment w EOMs, mild ttp along left inferior orbit.   Neck: Normal range of motion.  Pulmonary/Chest: Effort normal.  Abdominal:       Soft nontender  Musculoskeletal: Normal range of motion.  Neurological: He is alert and oriented to person, place, and time.       Slurred speech, + Nystagmus, Poor coordination, unable to assess CN. Strength 5/5 bilaterally.   Skin: Skin is warm and dry. No rash noted. He is not diaphoretic.       Superficial abrasion to left hand dorsal surface.   Psychiatric: He has a normal mood and affect. His behavior is normal.    ED Course  Procedures (including critical care time)  Labs Reviewed  ETHANOL - Abnormal; Notable for the following:    Alcohol, Ethyl (B) 239 (*)     All other components within normal limits  POCT I-STAT, CHEM 8 - Abnormal; Notable for the following:    Potassium 3.1 (*)     Creatinine, Ser 1.40 (*)     Glucose, Bld 113 (*)     Hemoglobin 11.6 (*)     HCT 34.0 (*)     All other components within normal limits   Ct Head Wo Contrast  02/23/2012  *RADIOLOGY REPORT*  Clinical Data:  Fall.  CT HEAD WITHOUT CONTRAST CT MAXILLOFACIAL WITHOUT CONTRAST  Technique:  Multidetector CT imaging of the head and maxillofacial structures were performed using the standard protocol without intravenous contrast. Multiplanar CT image reconstructions of the maxillofacial structures were also generated.  Comparison:   None.  CT HEAD  Findings: There is diffuse patchy low density throughout the subcortical and periventricular white matter consistent with chronic small vessel ischemic change.  There is prominence of the sulci and ventricles consistent with brain  atrophy.  There is no evidence for acute brain infarct, hemorrhage or mass.  The paranasal sinuses and mastoid air cells are clear.  The calvarium is intact.  IMPRESSION:  1.  No acute intracranial abnormalities.  CT MAXILLOFACIAL  Findings:   Mucosal thickening involves  bilateral maxillary sinuses.  Ethmoid air cells, sphenoid sinus and frontal sinuses are clear.  The mastoid air cells are clear.  No facial bone fracture identified.  IMPRESSION:  1.  No acute findings. 2.  Chronic appearing mucosal thickening involving the maxillary sinuses.   Original Report Authenticated By: Signa Kell, M.D.    Ct Maxillofacial Wo Cm  02/23/2012  *RADIOLOGY REPORT*  Clinical Data:  Fall.  CT HEAD WITHOUT CONTRAST CT MAXILLOFACIAL WITHOUT CONTRAST  Technique:  Multidetector CT imaging of the head and maxillofacial structures were performed using the standard protocol without intravenous contrast. Multiplanar CT image reconstructions of the maxillofacial structures were also generated.  Comparison:   None.  CT HEAD  Findings: There is diffuse patchy low density throughout the subcortical and periventricular white matter consistent with chronic small vessel ischemic change.  There is prominence of the sulci and ventricles consistent with brain atrophy.  There is no evidence for acute brain infarct, hemorrhage or mass.  The paranasal sinuses and mastoid air cells are clear.  The calvarium is intact.  IMPRESSION:  1.  No acute intracranial abnormalities.  CT MAXILLOFACIAL  Findings:   Mucosal thickening involves bilateral maxillary sinuses.  Ethmoid air cells, sphenoid sinus and frontal sinuses are clear.  The mastoid air cells are clear.  No facial bone fracture identified.  IMPRESSION:  1.  No acute findings. 2.  Chronic appearing mucosal thickening involving the maxillary sinuses.   Original Report Authenticated By: Signa Kell, M.D.    No diagnosis found.   Date: 02/23/2012  Rate: 62  Rhythm: normal sinus rhythm   QRS Axis: normal  Intervals: normal  ST/T Wave abnormalities: normal  Conduction Disutrbances:first-degree A-V block   Narrative Interpretation:   Old EKG Reviewed: none available   MDM  Fall, abrasions, alcohol intoxication  Unable to obtain good exam or hx d/t alc intoxication. Labs and imaging reviewed indicating intoxication & hypokalemia. Potassium and IVF given.  Pt care resumed by [employer]. Recommend repeat PE to assess for injuries that may have occurred during fall. Pt currently sleeping.         Jaci Carrel, [place] 02/23/12 2350

## 2012-02-24 NOTE — ED Notes (Signed)
PT. WAITING FOR SPOUSE TO TRANSPORT HIM BACK HOME .

## 2012-02-24 NOTE — ED Provider Notes (Signed)
Medical screening examination/treatment/procedure(s) were performed by non-physician practitioner and as supervising physician I was immediately available for consultation/collaboration.  Lacoya Wilbanks M Rees Santistevan, MD 02/24/12 0427 

## 2012-02-24 NOTE — ED Provider Notes (Signed)
Patient left to my care by PA PAZ. Patient fell while drinking.  CT negative.   He is alert and oriented. No ataxia or nystagmus. CT negative. Will D/C home. Discussed reasons to seek immediate care. Patient expresses understanding and agrees with plan.   Arthor Captain, PA-C 02/24/12 [phone]

## 2012-02-25 NOTE — ED Provider Notes (Signed)
Medical screening examination/treatment/procedure(s) were performed by non-physician practitioner and as supervising physician I was immediately available for consultation/collaboration.  Jones Skene, M.D.     Jones Skene, MD 02/25/12 1426

## 2012-03-02 ENCOUNTER — Telehealth: Payer: Self-pay | Admitting: Internal Medicine

## 2012-03-02 NOTE — Telephone Encounter (Signed)
Pt request order for BP machine

## 2012-03-02 NOTE — Telephone Encounter (Signed)
Patient was informed BP machine will be sent in by 5 today as he is going out of town at [employer] for Thanksgiving.  Please send to [facility] [address].

## 2012-03-02 NOTE — Telephone Encounter (Signed)
MD provided prescription for this patient and phoned the prescription into [facility] [address].  Called the patient informed rx phoned in.

## 2012-03-02 NOTE — Telephone Encounter (Signed)
Needs written Rx. 

## 2012-03-26 ENCOUNTER — Ambulatory Visit (INDEPENDENT_AMBULATORY_CARE_PROVIDER_SITE_OTHER): Payer: Medicare Other

## 2012-03-26 DIAGNOSIS — E538 Deficiency of other specified B group vitamins: Secondary | ICD-10-CM

## 2012-03-26 MED ORDER — CYANOCOBALAMIN 1000 MCG/ML IJ SOLN
1000.0000 ug | Freq: Once | INTRAMUSCULAR | Status: AC
  Administered 2012-03-26: 1000 ug via INTRAMUSCULAR

## 2012-04-05 ENCOUNTER — Other Ambulatory Visit: Payer: Self-pay | Admitting: Neurology

## 2012-04-26 ENCOUNTER — Ambulatory Visit (INDEPENDENT_AMBULATORY_CARE_PROVIDER_SITE_OTHER): Payer: Medicare Other

## 2012-04-26 ENCOUNTER — Telehealth: Payer: Self-pay

## 2012-04-26 DIAGNOSIS — E538 Deficiency of other specified B group vitamins: Secondary | ICD-10-CM

## 2012-04-26 MED ORDER — AMLODIPINE BESYLATE 5 MG PO TABS: 5.0000 mg | ORAL_TABLET | Freq: Every day | ORAL | Status: DC

## 2012-04-26 MED ORDER — CYANOCOBALAMIN 1000 MCG/ML IJ SOLN
1000.0000 ug | Freq: Once | INTRAMUSCULAR | Status: AC
  Administered 2012-04-26: 1000 ug via INTRAMUSCULAR

## 2012-04-26 NOTE — Telephone Encounter (Signed)
BP not as well controlled as we would like  Cr was 1.4 on nov 14, up slightly from previous  Instead of increased ARB, would add amlodipine 5 mg qd for better overall control  Pt to Please call in 3 weeks with same type of blood pressures

## 2012-04-26 NOTE — Telephone Encounter (Signed)
Patient informed and agreed to all instructions

## 2012-04-26 NOTE — Telephone Encounter (Signed)
Called left message to call back 

## 2012-04-26 NOTE — Telephone Encounter (Signed)
Patient informed with recent BP 140/93, 126/87, 137/93, 137/93, 137/91, 119/77,  137/91 , 128/55,  131/88.  Please advise

## 2012-05-07 IMAGING — CR DG CHEST 2V
2 series · 2 of 2 positions shown · non-contrast
Comparison: None

CLINICAL DATA: Preoperative respiratory exam for prostate
carcinoma.

CHEST - 2 VIEW

[view not recorded (1 of 2)]
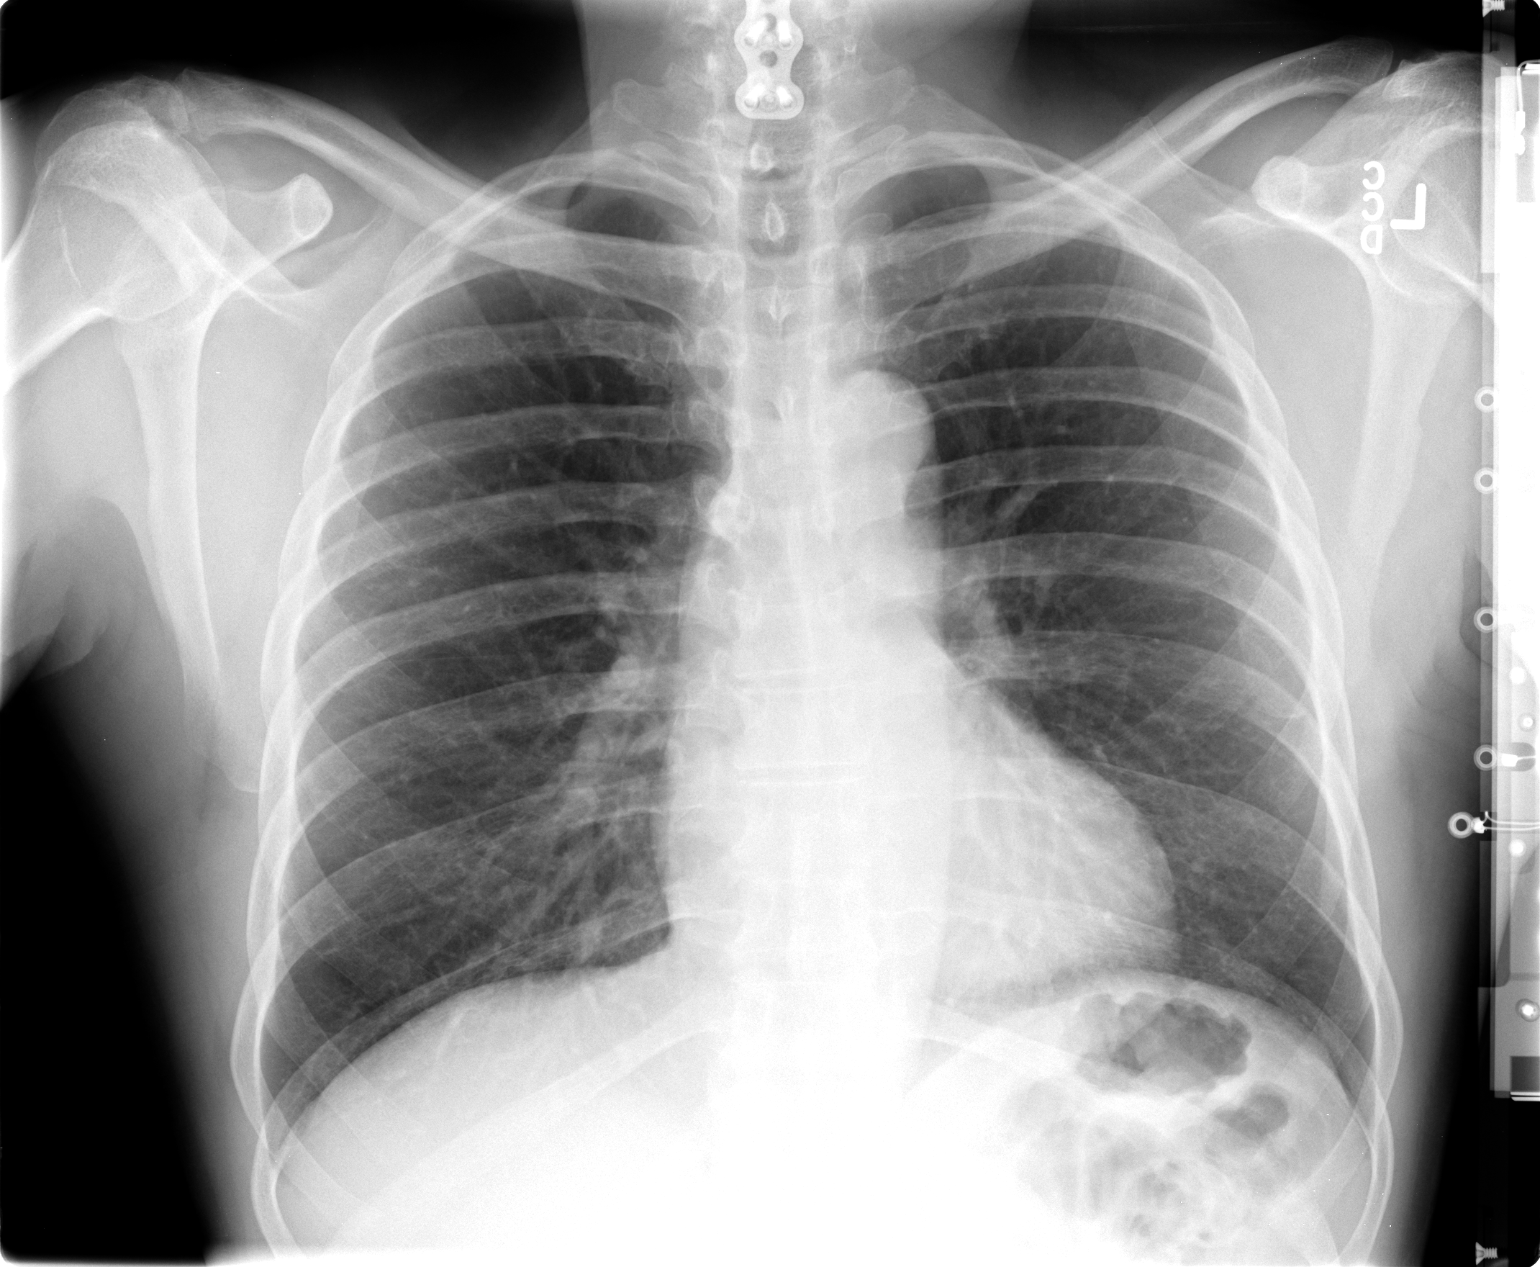

[view not recorded (2 of 2)]
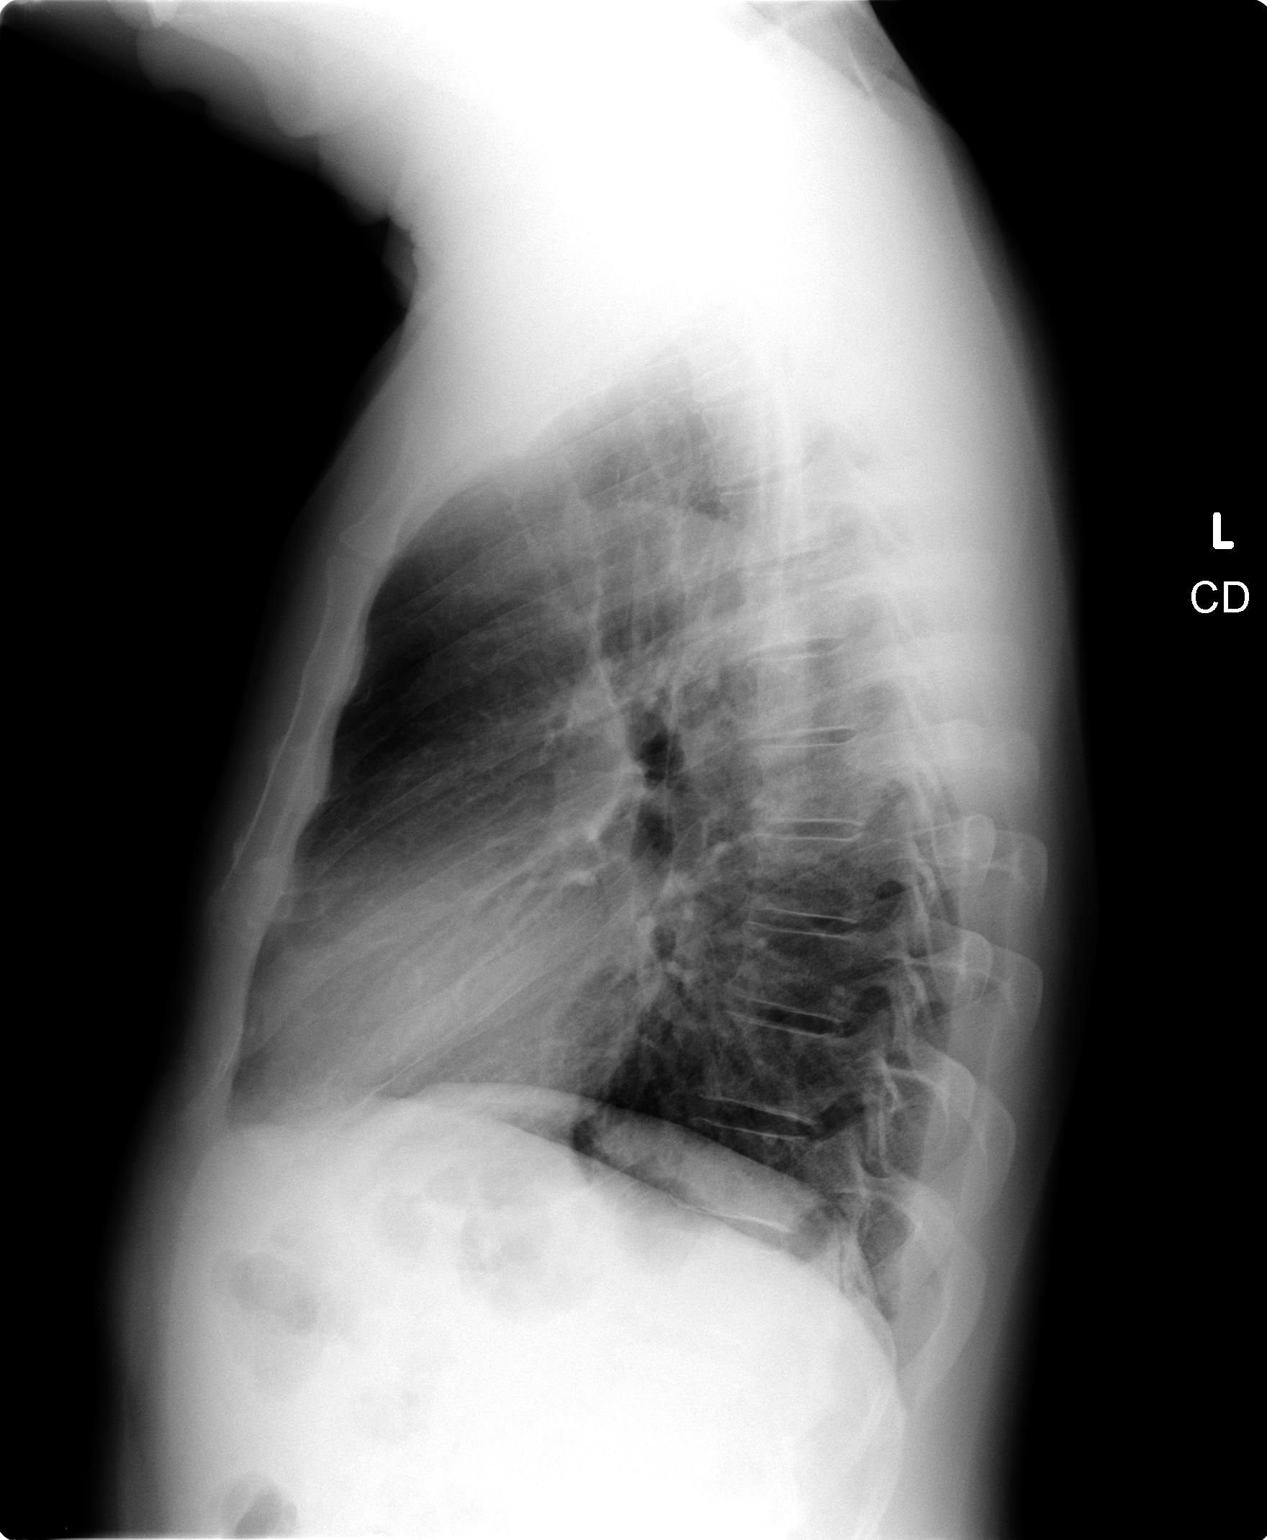

[2 of 2 positions shown; findings below may reference images not displayed]

FINDINGS: The heart size and mediastinal contours are within normal
limits.  Both lungs are clear.  The visualized skeletal structures
are unremarkable.
IMPRESSION: No active disease.

## 2012-05-18 ENCOUNTER — Other Ambulatory Visit: Payer: Medicare Other | Admitting: Lab

## 2012-05-25 ENCOUNTER — Ambulatory Visit: Payer: Medicare Other | Admitting: Oncology

## 2012-05-29 ENCOUNTER — Ambulatory Visit (INDEPENDENT_AMBULATORY_CARE_PROVIDER_SITE_OTHER): Payer: Medicare Other

## 2012-05-29 ENCOUNTER — Telehealth: Payer: Self-pay

## 2012-05-29 DIAGNOSIS — E538 Deficiency of other specified B group vitamins: Secondary | ICD-10-CM

## 2012-05-29 MED ORDER — CYANOCOBALAMIN 1000 MCG/ML IJ SOLN
1000.0000 ug | Freq: Once | INTRAMUSCULAR | Status: AC
  Administered 2012-05-29: 1000 ug via INTRAMUSCULAR

## 2012-05-29 NOTE — Telephone Encounter (Signed)
Patient came in for B12 shot today.  He informed he is out of Amlodipine and Atorvastatin until next Wednesday when he gets paid again.  Requested samples (which we do not have).  MD gave the patient 4 boxes (#7 each) of Azor 5/20 to take the place of Amlodipine and Avapro. We did not have samples for his cholesterol medication.  The patient was instructed to take ONLY Azor 5/20 for BP, not to take either Amlodipine or Avapro (which the patient is currrently out of both).  Once he is able to refill and pay for Amlodipine and avapro he is to stop Azor 5/20 and restart regular meds.  The patient understood instructions after having explained.

## 2012-06-15 ENCOUNTER — Other Ambulatory Visit: Payer: Medicare Other | Admitting: Lab

## 2012-06-21 ENCOUNTER — Other Ambulatory Visit: Payer: Self-pay | Admitting: Oncology

## 2012-06-22 ENCOUNTER — Telehealth: Payer: Self-pay | Admitting: Oncology

## 2012-06-22 ENCOUNTER — Ambulatory Visit: Payer: Medicare Other | Admitting: Oncology

## 2012-06-22 NOTE — Telephone Encounter (Signed)
s.w. pt and r/s appt done °

## 2012-06-26 ENCOUNTER — Ambulatory Visit: Payer: Medicare Other

## 2012-06-26 ENCOUNTER — Ambulatory Visit (INDEPENDENT_AMBULATORY_CARE_PROVIDER_SITE_OTHER): Payer: Medicare Other

## 2012-06-26 ENCOUNTER — Telehealth: Payer: Self-pay | Admitting: Internal Medicine

## 2012-06-26 DIAGNOSIS — E538 Deficiency of other specified B group vitamins: Secondary | ICD-10-CM

## 2012-06-26 MED ORDER — CYANOCOBALAMIN 1000 MCG/ML IJ SOLN
1000.0000 ug | Freq: Once | INTRAMUSCULAR | Status: AC
  Administered 2012-06-26: 1000 ug via INTRAMUSCULAR

## 2012-06-26 MED ORDER — IRBESARTAN 300 MG PO TABS: 300.0000 mg | ORAL_TABLET | Freq: Every day | ORAL | Status: DC

## 2012-06-26 NOTE — Telephone Encounter (Signed)
We received documentation per pt of mutl BP checks almost daily for the past month, and had reasonable control until the past wk with average it seems about 145/105  Robin to contact pt - is he taking meds?  (maybe he ran out of the recent samples we gave him?)

## 2012-06-26 NOTE — Telephone Encounter (Signed)
Called the patient informed of MD instructions.  The patient agreed and understood instructions.  Transferred to a scheduler to schedule appointment as instructed by MD.

## 2012-06-26 NOTE — Telephone Encounter (Signed)
Well, not exactly the same as the samples he had was of Azor, and the "benicar" part of the azor often works better than the avapro.  We can try to adjust to this, however, by trying to stay with the generic for now, and increasing the avapro to 300 mg  (done erx)  Please continue to monitor BP and re-check with OV here in 2-3 wks

## 2012-06-26 NOTE — Telephone Encounter (Signed)
Called the patient and he stated his BP went up after changing from the samples to the prescription from pharmacy.  He did state the samples and prescription are the same.

## 2012-06-27 ENCOUNTER — Ambulatory Visit: Payer: Medicare Other

## 2012-06-27 ENCOUNTER — Other Ambulatory Visit: Payer: Medicare Other

## 2012-06-28 ENCOUNTER — Telehealth: Payer: Self-pay | Admitting: Oncology

## 2012-06-28 ENCOUNTER — Ambulatory Visit: Payer: Medicare Other | Admitting: Oncology

## 2012-06-28 ENCOUNTER — Other Ambulatory Visit (HOSPITAL_BASED_OUTPATIENT_CLINIC_OR_DEPARTMENT_OTHER): Payer: Medicare Other

## 2012-06-28 LAB — COMPREHENSIVE METABOLIC PANEL (CC13)
AST: 24 U/L (ref 5–34)
Alkaline Phosphatase: 73 U/L (ref 40–150)
BUN: 14.5 mg/dL (ref 7.0–26.0)
Glucose: 127 mg/dl — ABNORMAL HIGH (ref 70–99)
Potassium: 3.9 mEq/L (ref 3.5–5.1)
Sodium: 141 mEq/L (ref 136–145)
Total Bilirubin: 1.16 mg/dL (ref 0.20–1.20)
Total Protein: 8 g/dL (ref 6.4–8.3)

## 2012-06-28 LAB — CBC WITH DIFFERENTIAL/PLATELET
BASO%: 0.4 % (ref 0.0–2.0)
EOS%: 3.5 % (ref 0.0–7.0)
MCH: 34.5 pg — ABNORMAL HIGH (ref 27.2–33.4)
MCHC: 34.8 g/dL (ref 32.0–36.0)
MONO#: 0.7 10*3/uL (ref 0.1–0.9)
NEUT%: 57.1 % (ref 39.0–75.0)
RBC: 4.21 10*6/uL (ref 4.20–5.82)
RDW: 13.3 % (ref 11.0–14.6)
WBC: 7.4 10*3/uL (ref 4.0–10.3)
lymph#: 2.2 10*3/uL (ref 0.9–3.3)

## 2012-06-28 NOTE — Telephone Encounter (Signed)
pt called to r/s missed lab...done °

## 2012-07-02 LAB — KAPPA/LAMBDA LIGHT CHAINS
Kappa free light chain: 2.34 mg/dL — ABNORMAL HIGH (ref 0.33–1.94)
Lambda Free Lght Chn: 1.23 mg/dL (ref 0.57–2.63)

## 2012-07-02 LAB — PROTEIN ELECTROPHORESIS, SERUM
Alpha-1-Globulin: 3.9 % (ref 2.9–4.9)
Alpha-2-Globulin: 8.3 % (ref 7.1–11.8)
Gamma Globulin: 18.4 % (ref 11.1–18.8)
Total Protein, Serum Electrophoresis: 8 g/dL (ref 6.0–8.3)

## 2012-07-04 ENCOUNTER — Telehealth: Payer: Self-pay | Admitting: Internal Medicine

## 2012-07-04 ENCOUNTER — Other Ambulatory Visit: Payer: Self-pay | Admitting: Internal Medicine

## 2012-07-04 NOTE — Telephone Encounter (Signed)
Called the patient to confirm we do use Zostavax.

## 2012-07-04 NOTE — Telephone Encounter (Signed)
[employer] covers shingles shot; co-pay varies significantly depending on type of shingles vaccination used.  Asking if office use Zostvac?  Once he is informed what type office uses, then [place] will advise copay. He needs to know before next office visit 07/26/12.  He uses MyChart which he checks daily or can be called at  [phone]. Please follow up.

## 2012-07-06 ENCOUNTER — Encounter: Payer: Self-pay | Admitting: Oncology

## 2012-07-06 ENCOUNTER — Ambulatory Visit (HOSPITAL_BASED_OUTPATIENT_CLINIC_OR_DEPARTMENT_OTHER): Payer: Medicare Other | Admitting: Oncology

## 2012-07-06 ENCOUNTER — Telehealth: Payer: Self-pay | Admitting: Oncology

## 2012-07-06 VITALS — BP 123/84 | HR 69 | Temp 97.3°F | Resp 20 | Ht 71.0 in | Wt 169.6 lb

## 2012-07-06 DIAGNOSIS — D472 Monoclonal gammopathy: Secondary | ICD-10-CM

## 2012-07-06 DIAGNOSIS — I10 Essential (primary) hypertension: Secondary | ICD-10-CM

## 2012-07-06 DIAGNOSIS — M79609 Pain in unspecified limb: Secondary | ICD-10-CM

## 2012-07-06 NOTE — Progress Notes (Signed)
[facility]  Telephone:([phone]) [phone] [phone]   OFFICE PROGRESS NOTE   Cc:  Oliver Barre, MD  DIAGNOSIS:  MGUS  CURRENT THERAPY: watchful observation.   INTERVAL HISTORY: Nathan Wallace 62 y.o. male returns for regular follow up by himself.  He feels well. He has pain in the left elbow; states it is due to an injury about 4 months ago. He denied numbness, tingling in the left hand.   Patient denies fever, anorexia, weight loss, fatigue, headache, visual changes, confusion, drenching night sweats, palpable lymph node swelling, mucositis, odynophagia, dysphagia, nausea vomiting, jaundice, chest pain, palpitation, shortness of breath, dyspnea on exertion, productive cough, gum bleeding, epistaxis, hematemesis, hemoptysis, abdominal pain, abdominal swelling, early satiety, melena, hematochezia, hematuria, skin rash, spontaneous bleeding, heat or cold intolerance, bowel bladder incontinence, back pain, focal motor weakness, paresthesia, depression, suicidal or homicidal ideation, feeling hopelessness.   Past Medical History  Diagnosis Date  . Prostate cancer 2011  . Hyperlipidemia     border line  . GERD (gastroesophageal reflux disease)     NO RECENT PROBLEMS AND NO MEDS  . Arthritis     RT KNEE AND RT SHOULDER  . Impaired glucose tolerance 06/22/2011  . Degenerative joint disease of knee, right   . Benign head tremor   . Left inguinal hernia   . CVA (cerebrovascular accident due to intracerebral hemorrhage) 08/03/2011  . MGUS (monoclonal gammopathy of unknown significance)   . Syncope 2012    after prostate surgery; neg neurology work up.     Past Surgical History  Procedure Laterality Date  . Spine surgery  2003 OR 2004    CERVICAL FUSION-STATES LIMITED ROM NECK  . Wrist surgery    . Inguinal hernia repair  06/01/2011    Procedure: HERNIA REPAIR INGUINAL ADULT;  Surgeon: Ernestene Mention, MD;  Location: [facility];  Service: General;  Laterality: Left;   Left Inguinal Hernia Repair with Mesh  . Robot assisted laparoscopic radical prostatectomy  jan 2012    da vinci    Current Outpatient Prescriptions  Medication Sig Dispense Refill  . acetaminophen (TYLENOL) 325 MG tablet Take 650 mg by mouth every 6 (six) hours as needed. For pain      . amLODipine (NORVASC) 5 MG tablet Take 1 tablet (5 mg total) by mouth daily.  90 tablet  3  . aspirin 81 MG chewable tablet Chew 81 mg by mouth daily.      Marland Kitchen atorvastatin (LIPITOR) 40 MG tablet Take 40 mg by mouth daily.      Marland Kitchen atorvastatin (LIPITOR) 40 MG tablet TAKE 1 TABLET (40 MG TOTAL) BY MOUTH DAILY.  90 tablet  2  . cyanocobalamin (,VITAMIN B-12,) 1000 MCG/ML injection Inject 1,000 mcg into the muscle every 30 (thirty) days.      Marland Kitchen gabapentin (NEURONTIN) 300 MG capsule Take 300-600 mg by mouth at bedtime as needed. For leg cramping.      . irbesartan (AVAPRO) 300 MG tablet Take 1 tablet (300 mg total) by mouth daily.  90 tablet  3   No current facility-administered medications for this visit.    ALLERGIES:  has No Known Allergies.  REVIEW OF SYSTEMS:  The rest of the 14-point review of system was negative.   Filed Vitals:   07/06/12 0942  BP: 123/84  Pulse: 69  Temp: 97.3 F (36.3 C)  Resp: 20   Wt Readings from Last 3 Encounters:  07/06/12 169 lb 9.6 oz (76.93 kg)  02/16/12  169 lb 14.4 oz (77.066 kg)  12/29/11 165 lb 12.8 oz (75.206 kg)   ECOG Performance status: 0  PHYSICAL EXAMINATION:   General:  well-nourished in no acute distress.  Eyes:  no scleral icterus.  ENT:  There were no oropharyngeal lesions.  Neck was without thyromegaly.  Lymphatics:  Negative cervical, supraclavicular or axillary adenopathy.  Respiratory: lungs were clear bilaterally without wheezing or crackles.  Cardiovascular:  Regular rate and rhythm, S1/S2, without murmur, rub or gallop.  There was no pedal edema.  GI:  abdomen was soft, flat, nontender, nondistended, without organomegaly.  Muscoloskeletal:  no  spinal tenderness of palpation of vertebral spine.  Skin exam was without echymosis, petichae.  Neuro exam was nonfocal.  Patient was able to get on and off exam table without assistance.  Gait was normal.  Patient was alerted and oriented.  Attention was good.   Language was appropriate.  Mood was normal without depression.  Speech was not pressured.  Thought content was not tangential.     LABORATORY/RADIOLOGY DATA:  Lab Results  Component Value Date   WBC 7.4 06/28/2012   HGB 14.5 06/28/2012   HCT 41.7 06/28/2012   PLT 290 06/28/2012   GLUCOSE 127* 06/28/2012   CHOL 172 12/19/2011   TRIG 235.0* 12/19/2011   HDL 63.40 12/19/2011   LDLDIRECT 87.9 12/19/2011   LDLCALC 113* 11/09/2009   ALKPHOS 73 06/28/2012   ALT 44 06/28/2012   AST 24 06/28/2012   NA 141 06/28/2012   K 3.9 06/28/2012   CL 104 06/28/2012   CREATININE 1.2 06/28/2012   BUN 14.5 06/28/2012   CO2 28 06/28/2012   PSA 0.05* 06/23/2011   INR 1.17 11/03/2011   HGBA1C 5.9 12/13/2011    ASSESSMENT AND PLAN:   1. Hyperlipidemia: He is on Lipitor per PCP. 2. Hypertension: on Avapro per PCP. 3. Neuropathic pain in his bilateral hands: This is most likely consistent with radiculopathy. On Neurontin per PCP. Patient has run out of this medication. I have encouraged him to call PCP office for refill.  4. Positive monoclonal gammopathy  -  Diagnosis:  He has 7% plasma cell on bone marrow biopsy and normal chromosome studies.  His diagnosis is best labelled as  MGUS (monoclonal gammopathy of unknown significance).  About 1% per year of patients with MGUS evolve into active multiple myeloma (with >10% plasma cell, anemia, kidney failure, high calcium, lytic bone lesions:  He has none of this at this time).   - Recommendation:  Watchful observation with blood work every 6 months.  In the future, if he develops anemia, kidney failure, high calcium, lytic bone lesions, then a repeat bone marrow biopsy may be needed to ensure that he has not progressed to  active multiple myeloma.    The length of time of the face-to-face encounter was 15 minutes. More than 50% of time was spent counseling and coordination of care.

## 2012-07-26 ENCOUNTER — Ambulatory Visit (INDEPENDENT_AMBULATORY_CARE_PROVIDER_SITE_OTHER): Payer: Medicare Other | Admitting: Internal Medicine

## 2012-07-26 ENCOUNTER — Encounter: Payer: Self-pay | Admitting: Internal Medicine

## 2012-07-26 VITALS — BP 126/86 | HR 75 | Temp 98.0°F | Ht 71.5 in | Wt 168.2 lb

## 2012-07-26 DIAGNOSIS — Z2911 Encounter for prophylactic immunotherapy for respiratory syncytial virus (RSV): Secondary | ICD-10-CM

## 2012-07-26 DIAGNOSIS — F32A Depression, unspecified: Secondary | ICD-10-CM | POA: Insufficient documentation

## 2012-07-26 DIAGNOSIS — E538 Deficiency of other specified B group vitamins: Secondary | ICD-10-CM

## 2012-07-26 DIAGNOSIS — Z0000 Encounter for general adult medical examination without abnormal findings: Secondary | ICD-10-CM

## 2012-07-26 DIAGNOSIS — Z23 Encounter for immunization: Secondary | ICD-10-CM

## 2012-07-26 DIAGNOSIS — F329 Major depressive disorder, single episode, unspecified: Secondary | ICD-10-CM | POA: Insufficient documentation

## 2012-07-26 DIAGNOSIS — M7541 Impingement syndrome of right shoulder: Secondary | ICD-10-CM | POA: Insufficient documentation

## 2012-07-26 HISTORY — DX: Depression, unspecified: F32.A

## 2012-07-26 MED ORDER — CYANOCOBALAMIN 1000 MCG/ML IJ SOLN
1000.0000 ug | Freq: Once | INTRAMUSCULAR | Status: AC
  Administered 2012-07-26: 1000 ug via INTRAMUSCULAR

## 2012-07-26 MED ORDER — GABAPENTIN 300 MG PO CAPS: 300.0000 mg | ORAL_CAPSULE | Freq: Every evening | ORAL | Status: DC | PRN

## 2012-07-26 MED ORDER — CITALOPRAM HYDROBROMIDE 10 MG PO TABS: 10.0000 mg | ORAL_TABLET | Freq: Every day | ORAL | Status: DC

## 2012-07-26 NOTE — Patient Instructions (Addendum)
Please take all new medication as prescribed - the citalopram 10 mg per day for low mood You had the shingles shot, and B12 shot today Please continue all other medications as before, including the blood pressure medications as you do Please continue your efforts at being more active, low cholesterol diet, and weight control. You are otherwise up to date with prevention measures today. I think we can hold off on more blood work today You will be contacted regarding the referral for: orthopedic for the right shoulder Thank you for enrolling in MyChart. Please follow the instructions below to securely access your online medical record. MyChart allows you to send messages to your doctor, view your test results, renew your prescriptions, schedule appointments, and more. Please return in 6 months, or sooner if needed

## 2012-07-26 NOTE — Assessment & Plan Note (Signed)
For shot today

## 2012-07-26 NOTE — Assessment & Plan Note (Signed)
For ortho referral 

## 2012-07-26 NOTE — Progress Notes (Signed)
Subjective:    Patient ID: Nathan Wallace, male    DOB: June 20, 1950, 62 y.o.   MRN: [id number]  HPI  Here for wellness and f/u;  Overall doing ok;  Pt denies CP, worsening SOB, DOE, wheezing, orthopnea, PND, worsening LE edema, palpitations, dizziness or syncope.  Pt denies neurological change such as new headache, facial or extremity weakness.  Pt denies polydipsia, polyuria, or low sugar symptoms. Pt states overall good compliance with treatment and medications, good tolerability, and has been trying to follow lower cholesterol diet.  Pt has had mild worsening depressive symptoms, but no suicidal ideation or panic. No fever, night sweats, wt loss, loss of appetite, or other constitutional symptoms.  Pt states good ability with ADL's, has low fall risk, home safety reviewed and adequate, no other significant changes in hearing or vision, and only occasionally active with exercise.  Does have pain to right shoudler on abduction but o/w FROM. Past Medical History  Diagnosis Date  . Prostate cancer 2011  . Hyperlipidemia     border line  . GERD (gastroesophageal reflux disease)     NO RECENT PROBLEMS AND NO MEDS  . Arthritis     RT KNEE AND RT SHOULDER  . Impaired glucose tolerance 06/22/2011  . Degenerative joint disease of knee, right   . Benign head tremor   . Left inguinal hernia   . CVA (cerebrovascular accident due to intracerebral hemorrhage) 08/03/2011  . MGUS (monoclonal gammopathy of unknown significance)   . Syncope 2012    after prostate surgery; neg neurology work up.   . Depression 07/26/2012   Past Surgical History  Procedure Laterality Date  . Spine surgery  2003 OR 2004    CERVICAL FUSION-STATES LIMITED ROM NECK  . Wrist surgery    . Inguinal hernia repair  06/01/2011    Procedure: HERNIA REPAIR INGUINAL ADULT;  Surgeon: Ernestene Mention, MD;  Location: [facility];  Service: General;  Laterality: Left;  Left Inguinal Hernia Repair with Mesh  . Robot assisted laparoscopic radical  prostatectomy  jan 2012    da vinci    reports that he quit smoking about 46 years ago. He has never used smokeless tobacco. He reports that he drinks about 3.0 ounces of alcohol per week. He reports that he does not use illicit drugs. family history includes Aneurysm in his mother; Cancer in his father; Heart attack in his brother; and Hypertension in his mother. No Known Allergies Current Outpatient Prescriptions on File Prior to Visit  Medication Sig Dispense Refill  . acetaminophen (TYLENOL) 325 MG tablet Take 650 mg by mouth every 6 (six) hours as needed. For pain      . amLODipine (NORVASC) 5 MG tablet Take 1 tablet (5 mg total) by mouth daily.  90 tablet  3  . aspirin 81 MG chewable tablet Chew 81 mg by mouth daily.      Marland Kitchen atorvastatin (LIPITOR) 40 MG tablet Take 40 mg by mouth daily.      Marland Kitchen atorvastatin (LIPITOR) 40 MG tablet TAKE 1 TABLET (40 MG TOTAL) BY MOUTH DAILY.  90 tablet  2  . cyanocobalamin (,VITAMIN B-12,) 1000 MCG/ML injection Inject 1,000 mcg into the muscle every 30 (thirty) days.      . irbesartan (AVAPRO) 300 MG tablet Take 1 tablet (300 mg total) by mouth daily.  90 tablet  3   No current facility-administered medications on file prior to visit.    Review of Systems Constitutional: Negative for diaphoresis, activity change,  appetite change or unexpected weight change.  HENT: Negative for hearing loss, ear pain, facial swelling, mouth sores and neck stiffness.   Eyes: Negative for pain, redness and visual disturbance.  Respiratory: Negative for shortness of breath and wheezing.   Cardiovascular: Negative for chest pain and palpitations.  Gastrointestinal: Negative for diarrhea, blood in stool, abdominal distention or other pain Genitourinary: Negative for hematuria, flank pain or change in urine volume.  Musculoskeletal: Negative for myalgias and joint swelling.  Skin: Negative for color change and wound.  Neurological: Negative for syncope and numbness. other  than noted Hematological: Negative for adenopathy.  Psychiatric/Behavioral: Negative for hallucinations, self-injury, decreased concentration and agitation.      Objective:   Physical Exam BP 126/86  Pulse 75  Temp(Src) 98 F (36.7 C) (Oral)  Ht 5' 11.5" (1.816 m)  Wt 168 lb 4 oz (76.318 kg)  BMI 23.14 kg/m2  SpO2 96% VS noted,  Constitutional: Pt is oriented to person, place, and time. Appears well-developed and well-nourished.  Head: Normocephalic and atraumatic.  Right Ear: External ear normal.  Left Ear: External ear normal.  Nose: Nose normal.  Mouth/Throat: Oropharynx is clear and moist.  Eyes: Conjunctivae and EOM are normal. Pupils are equal, round, and reactive to light.  Neck: Normal range of motion. Neck supple. No JVD present. No tracheal deviation present.  Cardiovascular: Normal rate, regular rhythm, normal heart sounds and intact distal pulses.   Pulmonary/Chest: Effort normal and breath sounds normal.  Abdominal: Soft. Bowel sounds are normal. There is no tenderness. No HSM  Musculoskeletal: Normal range of motion. Exhibits no edema.  Lymphadenopathy:  Has no cervical adenopathy.  Neurological: Pt is alert and oriented to person, place, and time. Pt has normal reflexes. No cranial nerve deficit.  Skin: Skin is warm and dry. No rash noted.  Psychiatric:  Has depressed mood and affect. Behavior is normal.  Right shoulder NT, but pain elicited to full abduction    Assessment & Plan:

## 2012-07-26 NOTE — Assessment & Plan Note (Addendum)
For ssri,  to f/u any worsening symptoms or concerns, verified nonsuicidal

## 2012-07-26 NOTE — Assessment & Plan Note (Signed)

## 2012-08-23 ENCOUNTER — Other Ambulatory Visit: Payer: Self-pay | Admitting: Internal Medicine

## 2012-08-23 ENCOUNTER — Telehealth: Payer: Self-pay | Admitting: *Deleted

## 2012-08-23 NOTE — Telephone Encounter (Signed)
Left msg on triage requesting call back. Called pt back wanted to verify meds he suppose to be taking. Went over med list with pt saw md bck in April. Pt states he is holding off on the citalopram for right now...Raechel Chute

## 2012-08-27 ENCOUNTER — Ambulatory Visit (INDEPENDENT_AMBULATORY_CARE_PROVIDER_SITE_OTHER): Payer: Medicare Other

## 2012-08-27 DIAGNOSIS — E538 Deficiency of other specified B group vitamins: Secondary | ICD-10-CM

## 2012-08-27 MED ORDER — CYANOCOBALAMIN 1000 MCG/ML IJ SOLN
1000.0000 ug | Freq: Once | INTRAMUSCULAR | Status: AC
  Administered 2012-08-27: 1000 ug via INTRAMUSCULAR

## 2012-09-20 ENCOUNTER — Other Ambulatory Visit: Payer: Self-pay | Admitting: Internal Medicine

## 2012-09-27 ENCOUNTER — Ambulatory Visit (INDEPENDENT_AMBULATORY_CARE_PROVIDER_SITE_OTHER): Payer: Medicare Other

## 2012-09-27 ENCOUNTER — Telehealth: Payer: Self-pay

## 2012-09-27 DIAGNOSIS — E538 Deficiency of other specified B group vitamins: Secondary | ICD-10-CM

## 2012-09-27 MED ORDER — IRBESARTAN 300 MG PO TABS: 300.0000 mg | ORAL_TABLET | Freq: Every day | ORAL | Status: DC

## 2012-09-27 MED ORDER — CYANOCOBALAMIN 1000 MCG/ML IJ SOLN
1000.0000 ug | Freq: Once | INTRAMUSCULAR | Status: AC
  Administered 2012-09-27: 1000 ug via INTRAMUSCULAR

## 2012-09-27 NOTE — Telephone Encounter (Signed)
Likely an error, ok to change back to 300 qd

## 2012-09-27 NOTE — Telephone Encounter (Signed)
Patient informed rx corrected

## 2012-09-27 NOTE — Telephone Encounter (Signed)
The patient was taking irbesartan 300 mg qd and getting number 90, but recent rx was for irbesartan 150 mg please advise on change as patient is confused as to what he is suppose to be taking.

## 2012-10-29 ENCOUNTER — Ambulatory Visit: Payer: Medicare Other

## 2012-10-29 ENCOUNTER — Ambulatory Visit (INDEPENDENT_AMBULATORY_CARE_PROVIDER_SITE_OTHER): Payer: Medicare Other | Admitting: Internal Medicine

## 2012-10-29 ENCOUNTER — Encounter: Payer: Self-pay | Admitting: Internal Medicine

## 2012-10-29 VITALS — BP 130/76 | HR 52 | Temp 97.9°F | Wt 165.0 lb

## 2012-10-29 DIAGNOSIS — M1711 Unilateral primary osteoarthritis, right knee: Secondary | ICD-10-CM

## 2012-10-29 DIAGNOSIS — E538 Deficiency of other specified B group vitamins: Secondary | ICD-10-CM

## 2012-10-29 MED ORDER — PREDNISONE 10 MG PO TABS: ORAL_TABLET | ORAL | Status: DC

## 2012-10-29 MED ORDER — CYANOCOBALAMIN 1000 MCG/ML IJ SOLN
1000.0000 ug | Freq: Once | INTRAMUSCULAR | Status: AC
  Administered 2012-10-29: 1000 ug via INTRAMUSCULAR

## 2012-10-29 NOTE — Addendum Note (Signed)
Addended by: Edwena Felty T on: 10/29/2012 09:56 AM   Modules accepted: Orders

## 2012-10-29 NOTE — Assessment & Plan Note (Signed)
B12 monthly injection given today

## 2012-10-29 NOTE — Patient Instructions (Signed)

## 2012-10-29 NOTE — Assessment & Plan Note (Signed)
Current flare eRx for pred taper Tyelonol/ibuprofen as needed for pain

## 2012-10-29 NOTE — Progress Notes (Signed)
Subjective:    Patient ID: Nathan Wallace, male    DOB: [birth year], 62 y.o.   MRN: [id number]  HPI  Pt presents to the clinic today with c/o right knee pain and swelling. This started 3-4 days ago. He had been working outside on his knees while working on a fence. He describes the pain as constant achy stiffness. Pain is 3/10. He has taken ibuprofen for the pain which has helped. He denies any specific injury to the area. He does have a history of arthritis in this knee. Additionally, he would also like to get his monthly B12 injection while he is here today.  Review of Systems      Past Medical History  Diagnosis Date  . Prostate cancer 2011  . Hyperlipidemia     border line  . GERD (gastroesophageal reflux disease)     NO RECENT PROBLEMS AND NO MEDS  . Arthritis     RT KNEE AND RT SHOULDER  . Impaired glucose tolerance 06/22/2011  . Degenerative joint disease of knee, right   . Benign head tremor   . Left inguinal hernia   . CVA (cerebrovascular accident due to intracerebral hemorrhage) 08/03/2011  . MGUS (monoclonal gammopathy of unknown significance)   . Syncope 2012    after prostate surgery; neg neurology work up.   . Depression 07/26/2012    Current Outpatient Prescriptions  Medication Sig Dispense Refill  . acetaminophen (TYLENOL) 325 MG tablet Take 650 mg by mouth every 6 (six) hours as needed. For pain      . amLODipine (NORVASC) 5 MG tablet Take 1 tablet (5 mg total) by mouth daily.  90 tablet  3  . aspirin 81 MG chewable tablet Chew 81 mg by mouth daily.      Marland Kitchen atorvastatin (LIPITOR) 40 MG tablet Take 40 mg by mouth daily.      Marland Kitchen atorvastatin (LIPITOR) 40 MG tablet TAKE 1 TABLET (40 MG TOTAL) BY MOUTH DAILY.  90 tablet  2  . CIALIS 20 MG tablet TAKE 1 TABLET (20 MG TOTAL) BY MOUTH DAILY AS NEEDED FOR ERECTILE DYSFUNCTION.  10 tablet  0  . citalopram (CELEXA) 10 MG tablet Take 1 tablet (10 mg total) by mouth daily.  90 tablet  3  . cyanocobalamin (,VITAMIN B-12,)  1000 MCG/ML injection Inject 1,000 mcg into the muscle every 30 (thirty) days.      Marland Kitchen gabapentin (NEURONTIN) 300 MG capsule Take 1-2 capsules (300-600 mg total) by mouth at bedtime as needed. For leg cramping.  180 capsule  1  . irbesartan (AVAPRO) 300 MG tablet Take 1 tablet (300 mg total) by mouth at bedtime.  90 tablet  3  . irbesartan (AVAPRO) 300 MG tablet Take 1 tablet (300 mg total) by mouth daily.  90 tablet  3   No current facility-administered medications for this visit.    No Known Allergies  Family History  Problem Relation Age of Onset  . Hypertension Mother   . Aneurysm Mother     brain  . Cancer Father     colon  . Heart attack Brother     History   Social History  . Marital Status: Married    Spouse Name: N/A    Number of Children: 3  . Years of Education: N/A   Occupational History  .      retired Acupuncturist.    Social History Main Topics  . Smoking status: Former Smoker    Quit date: 06/28/1966  .  Smokeless tobacco: Never Used     Comment: QUIT SMOKING IN THE 70'S  . Alcohol Use: 3.0 oz/week    5 Cans of beer per week     Comment: 5th a liquor a week beer and vodka  . Drug Use: No  . Sexually Active: Not on file   Other Topics Concern  . Not on file   Social History Narrative  . No narrative on file     Constitutional: Pt reports fatigue. Denies fever, malaise,  headache or abrupt weight changes.  Musculoskeletal: Pt reports right knee pain and swelling. Denies decrease in range of motion, difficulty with gait, muscle pain.  Skin: Denies redness, rashes, lesions or ulcercations.  Neurological: Denies numbness or tingling in the hands or feet, dizziness, difficulty with memory, difficulty with speech or problems with balance and coordination.   No other specific complaints in a complete review of systems (except as listed in HPI above).   Objective:   Physical Exam  BP 130/76  Pulse 52  Temp(Src) 97.9 F (36.6 C) (Oral)  Wt  165 lb (74.844 kg)  BMI 22.69 kg/m2  SpO2 99% Wt Readings from Last 3 Encounters:  10/29/12 165 lb (74.844 kg)  07/26/12 168 lb 4 oz (76.318 kg)  07/06/12 169 lb 9.6 oz (76.93 kg)    General: Appears his stated age, well developed, well nourished in NAD. Skin: Warm, dry and intact. No rashes, lesions or ulcerations noted. Cardiovascular: Normal rate and rhythm. S1,S2 noted.  No murmur, rubs or gallops noted. No JVD or BLE edema. No carotid bruits noted. Pulmonary/Chest: Normal effort and positive vesicular breath sounds. No respiratory distress. No wheezes, rales or ronchi noted.  Musculoskeletal: Normal range of motion. 1+ swelling of the superior knee. Crepitus noted with ROM. No difficulty with gait.  Neurological: Alert and oriented. Cranial nerves II-XII intact. Coordination normal. +DTRs bilaterally.   BMET    Component Value Date/Time   NA 141 06/28/2012 1010   NA 143 02/23/2012 2253   K 3.9 06/28/2012 1010   K 3.1* 02/23/2012 2253   CL 104 06/28/2012 1010   CL 107 02/23/2012 2253   CO2 28 06/28/2012 1010   CO2 29 12/13/2011 1549   GLUCOSE 127* 06/28/2012 1010   GLUCOSE 113* 02/23/2012 2253   BUN 14.5 06/28/2012 1010   BUN 10 02/23/2012 2253   CREATININE 1.2 06/28/2012 1010   CREATININE 1.40* 02/23/2012 2253   CALCIUM 9.7 06/28/2012 1010   CALCIUM 9.4 12/13/2011 1549   GFRNONAA 76* 11/03/2011 1645   GFRAA 88* 11/03/2011 1645    Lipid Panel     Component Value Date/Time   CHOL 172 12/19/2011 1627   TRIG 235.0* 12/19/2011 1627   HDL 63.40 12/19/2011 1627   CHOLHDL 3 12/19/2011 1627   VLDL 47.0* 12/19/2011 1627   LDLCALC 113* 11/09/2009 0814    CBC    Component Value Date/Time   WBC 7.4 06/28/2012 1010   WBC 6.6 01/25/2012 1156   RBC 4.21 06/28/2012 1010   RBC 4.04* 01/25/2012 1156   HGB 14.5 06/28/2012 1010   HGB 11.6* 02/23/2012 2253   HCT 41.7 06/28/2012 1010   HCT 34.0* 02/23/2012 2253   PLT 290 06/28/2012 1010   PLT 297 01/25/2012 1156   MCV 99.0* 06/28/2012 1010   MCV 98.3  01/25/2012 1156   MCH 34.5* 06/28/2012 1010   MCH 34.7* 01/25/2012 1156   MCHC 34.8 06/28/2012 1010   MCHC 35.3 01/25/2012 1156   RDW 13.3 06/28/2012 1010  RDW 12.4 01/25/2012 1156   LYMPHSABS 2.2 06/28/2012 1010   LYMPHSABS 2.1 01/25/2012 1156   MONOABS 0.7 06/28/2012 1010   MONOABS 0.5 01/25/2012 1156   EOSABS 0.3 06/28/2012 1010   EOSABS 0.1 01/25/2012 1156   BASOSABS 0.0 06/28/2012 1010   BASOSABS 0.0 01/25/2012 1156    Hgb A1C Lab Results  Component Value Date   HGBA1C 5.9 12/13/2011         Assessment & Plan:

## 2012-11-09 ENCOUNTER — Ambulatory Visit (INDEPENDENT_AMBULATORY_CARE_PROVIDER_SITE_OTHER): Payer: Medicare Other | Admitting: Internal Medicine

## 2012-11-09 ENCOUNTER — Encounter: Payer: Self-pay | Admitting: Internal Medicine

## 2012-11-09 VITALS — BP 108/70 | HR 70 | Temp 98.5°F | Ht 71.5 in | Wt 167.2 lb

## 2012-11-09 DIAGNOSIS — I10 Essential (primary) hypertension: Secondary | ICD-10-CM

## 2012-11-09 DIAGNOSIS — R7302 Impaired glucose tolerance (oral): Secondary | ICD-10-CM

## 2012-11-09 DIAGNOSIS — J019 Acute sinusitis, unspecified: Secondary | ICD-10-CM | POA: Insufficient documentation

## 2012-11-09 DIAGNOSIS — M109 Gout, unspecified: Secondary | ICD-10-CM | POA: Insufficient documentation

## 2012-11-09 DIAGNOSIS — R7309 Other abnormal glucose: Secondary | ICD-10-CM

## 2012-11-09 DIAGNOSIS — M1711 Unilateral primary osteoarthritis, right knee: Secondary | ICD-10-CM

## 2012-11-09 MED ORDER — TRAMADOL HCL 50 MG PO TABS
50.0000 mg | ORAL_TABLET | Freq: Four times a day (QID) | ORAL | Status: DC | PRN
Start: 2012-11-09 — End: 2013-02-28

## 2012-11-09 MED ORDER — AZITHROMYCIN 250 MG PO TABS: ORAL_TABLET | ORAL | Status: DC

## 2012-11-09 MED ORDER — METHYLPREDNISOLONE ACETATE 80 MG/ML IJ SUSP
80.0000 mg | Freq: Once | INTRAMUSCULAR | Status: AC
  Administered 2012-11-09: 80 mg via INTRAMUSCULAR

## 2012-11-09 MED ORDER — PREDNISONE 10 MG PO TABS: ORAL_TABLET | ORAL | Status: DC

## 2012-11-09 MED ORDER — HYDROCODONE-HOMATROPINE 5-1.5 MG/5ML PO SYRP: 5.0000 mL | ORAL_SOLUTION | Freq: Four times a day (QID) | ORAL | Status: DC | PRN

## 2012-11-09 NOTE — Assessment & Plan Note (Signed)
?   Gouty flare vs bursitis flare, for depomedrol IM, predpack asd, pain med,  to f/u any worsening symptoms or concerns, consider podiatry if not improved

## 2012-11-09 NOTE — Patient Instructions (Signed)
You had the steroid shot today Please take all new medication as prescribed - the antibiotic and cough medicine, as well as the prednisone and pain medication for the foot Please call in 3 days if not improved, for referral to podiatry  Please remember to sign up for My Chart if you have not done so, as this will be important to you in the future with finding out test results, communicating by private email, and scheduling acute appointments online when needed.

## 2012-11-09 NOTE — Assessment & Plan Note (Signed)
stable overall by history and exam, recent data reviewed with pt, and pt to continue medical treatment as before,  to f/u any worsening symptoms or concerns BP Readings from Last 3 Encounters:  11/09/12 108/70  10/29/12 130/76  07/26/12 126/86

## 2012-11-09 NOTE — Assessment & Plan Note (Signed)
Mild to mod, for antibx course,  to f/u any worsening symptoms or concerns 

## 2012-11-09 NOTE — Progress Notes (Signed)
Subjective:    Patient ID: Nathan [facility], male    DOB: 1950-06-28, 62 y.o.   MRN: [id number]  HPI  Here with 3 days acute onset left first MTP area pain, red, swelling, particularly severe medially to touch, worse to ambulate, at least 8/10, better to not walk or put pressure, no fever, trauma, No hx of gout.  No other joint issues.  Incidentally -  Here with 2-3 days acute onset fever, facial pain, pressure, headache, general weakness and malaise, and greenish d/c, with mild ST and cough, but pt denies chest pain, wheezing, increased sob or doe, orthopnea, PND, increased LE swelling, palpitations, dizziness or syncope. Pt denies new neurological symptoms such as new headache, or facial or extremity weakness or numbness  Pt denies polydipsia, polyuria Past Medical History  Diagnosis Date  . Prostate cancer 2011  . Hyperlipidemia     border line  . GERD (gastroesophageal reflux disease)     NO RECENT PROBLEMS AND NO MEDS  . Arthritis     RT KNEE AND RT SHOULDER  . Impaired glucose tolerance 06/22/2011  . Degenerative joint disease of knee, right   . Benign head tremor   . Left inguinal hernia   . CVA (cerebrovascular accident due to intracerebral hemorrhage) 08/03/2011  . MGUS (monoclonal gammopathy of unknown significance)   . Syncope 2012    after prostate surgery; neg neurology work up.   . Depression 07/26/2012   Past Surgical History  Procedure Laterality Date  . Spine surgery  2003 OR 2004    CERVICAL FUSION-STATES LIMITED ROM NECK  . Wrist surgery    . Inguinal hernia repair  06/01/2011    Procedure: HERNIA REPAIR INGUINAL ADULT;  Surgeon: Ernestene Mention, MD;  Location: [facility];  Service: General;  Laterality: Left;  Left Inguinal Hernia Repair with Mesh  . Robot assisted laparoscopic radical prostatectomy  jan 2012    da vinci    reports that he quit smoking about 46 years ago. He has never used smokeless tobacco. He reports that he drinks about 3.0 ounces of alcohol per week.  He reports that he does not use illicit drugs. family history includes Aneurysm in his mother; Cancer in his father; Heart attack in his brother; and Hypertension in his mother. No Known Allergies Current Outpatient Prescriptions on File Prior to Visit  Medication Sig Dispense Refill  . acetaminophen (TYLENOL) 325 MG tablet Take 650 mg by mouth every 6 (six) hours as needed. For pain      . amLODipine (NORVASC) 5 MG tablet Take 1 tablet (5 mg total) by mouth daily.  90 tablet  3  . aspirin 81 MG chewable tablet Chew 81 mg by mouth daily.      Marland Kitchen atorvastatin (LIPITOR) 40 MG tablet TAKE 1 TABLET (40 MG TOTAL) BY MOUTH DAILY.  90 tablet  2  . CIALIS 20 MG tablet TAKE 1 TABLET (20 MG TOTAL) BY MOUTH DAILY AS NEEDED FOR ERECTILE DYSFUNCTION.  10 tablet  0  . citalopram (CELEXA) 10 MG tablet Take 1 tablet (10 mg total) by mouth daily.  90 tablet  3  . cyanocobalamin (,VITAMIN B-12,) 1000 MCG/ML injection Inject 1,000 mcg into the muscle every 30 (thirty) days.      Marland Kitchen gabapentin (NEURONTIN) 300 MG capsule Take 1-2 capsules (300-600 mg total) by mouth at bedtime as needed. For leg cramping.  180 capsule  1  . irbesartan (AVAPRO) 300 MG tablet Take 1 tablet (300 mg total) by mouth  at bedtime.  90 tablet  3  . irbesartan (AVAPRO) 300 MG tablet Take 1 tablet (300 mg total) by mouth daily.  90 tablet  3  . sildenafil (REVATIO) 20 MG tablet Take 20 mg by mouth daily as needed.       No current facility-administered medications on file prior to visit.   Review of Systems  Constitutional: Negative for unexpected weight change, or unusual diaphoresis  HENT: Negative for tinnitus.   Eyes: Negative for photophobia and visual disturbance.  Respiratory: Negative for choking and stridor.   Gastrointestinal: Negative for vomiting and blood in stool.  Genitourinary: Negative for hematuria and decreased urine volume.  Musculoskeletal: Negative for acute joint swelling Skin: Negative for color change and wound.   Neurological: Negative for tremors and numbness other than noted  Psychiatric/Behavioral: Negative for decreased concentration or  hyperactivity.       Objective:   Physical Exam BP 108/70  Pulse 70  Temp(Src) 98.5 F (36.9 C) (Oral)  Ht 5' 11.5" (1.816 m)  Wt 167 lb 4 oz (75.864 kg)  BMI 23 kg/m2  SpO2 97% VS noted, mild ill Constitutional: Pt appears well-developed and well-nourished.  HENT: Head: NCAT.  Right Ear: External ear normal.  Left Ear: External ear normal.  Eyes: Conjunctivae and EOM are normal. Pupils are equal, round, and reactive to light.  Neck: Normal range of motion. Neck supple.  Bilat tm's with mild erythema.  Max sinus areas mild tender.  Pharynx with mild erythema, no exudate Cardiovascular: Normal rate and regular rhythm.   Pulmonary/Chest: Effort normal and breath sounds normal.  Neurological: Pt is alert. Not confused  o/w not done in detail Skin: left first MTP with 2+ red/tender/swelling worse over the bursa with some evidence for left great toe lateral deviation Psychiatric: Pt behavior is normal. Thought content normal.     Assessment & Plan:

## 2012-11-09 NOTE — Assessment & Plan Note (Signed)
stable overall by history and exam, recent data reviewed with pt, and pt to continue medical treatment as before,  to f/u any worsening symptoms or concerns Lab Results  Component Value Date   HGBA1C 5.9 12/13/2011

## 2012-11-19 ENCOUNTER — Telehealth: Payer: Self-pay | Admitting: *Deleted

## 2012-11-19 DIAGNOSIS — M1711 Unilateral primary osteoarthritis, right knee: Secondary | ICD-10-CM

## 2012-11-19 MED ORDER — PREDNISONE 10 MG PO TABS: ORAL_TABLET | ORAL | Status: DC

## 2012-11-19 MED ORDER — ALLOPURINOL 100 MG PO TABS: 100.0000 mg | ORAL_TABLET | Freq: Every day | ORAL | Status: DC

## 2012-11-19 NOTE — Telephone Encounter (Signed)
Pt called states he was seen and treated for Gout, states had relief with medication.  further states medication is complete and pain has returned.  Please advise

## 2012-11-19 NOTE — Telephone Encounter (Signed)
Spoke with pt, advised of MDs order 

## 2012-11-19 NOTE — Telephone Encounter (Signed)
OK to take a lower dose predpack  - done erx  Also to start a prevention med - allopurinol 100 qd

## 2012-12-03 ENCOUNTER — Ambulatory Visit (INDEPENDENT_AMBULATORY_CARE_PROVIDER_SITE_OTHER): Payer: Medicare Other

## 2012-12-03 DIAGNOSIS — E538 Deficiency of other specified B group vitamins: Secondary | ICD-10-CM

## 2012-12-03 MED ORDER — CYANOCOBALAMIN 1000 MCG/ML IJ SOLN
1000.0000 ug | Freq: Once | INTRAMUSCULAR | Status: AC
  Administered 2012-12-03: 1000 ug via INTRAMUSCULAR

## 2012-12-19 ENCOUNTER — Other Ambulatory Visit: Payer: Medicare Other | Admitting: Lab

## 2012-12-24 ENCOUNTER — Telehealth: Payer: Self-pay | Admitting: *Deleted

## 2012-12-24 NOTE — Telephone Encounter (Signed)
sw pt made him aware that the doctor will be out of the office on 9/17. gv appt for 01/17/13 w/labs@ 11:30am and ov @ 12 noon. Pt is aware...td

## 2012-12-26 ENCOUNTER — Ambulatory Visit: Payer: Medicare Other

## 2012-12-27 ENCOUNTER — Ambulatory Visit (INDEPENDENT_AMBULATORY_CARE_PROVIDER_SITE_OTHER): Payer: Medicare Other

## 2012-12-27 DIAGNOSIS — E538 Deficiency of other specified B group vitamins: Secondary | ICD-10-CM

## 2012-12-27 MED ORDER — CYANOCOBALAMIN 1000 MCG/ML IJ SOLN
1000.0000 ug | Freq: Once | INTRAMUSCULAR | Status: AC
  Administered 2012-12-27: 1000 ug via INTRAMUSCULAR

## 2013-01-17 ENCOUNTER — Other Ambulatory Visit: Payer: Medicare Other

## 2013-01-17 ENCOUNTER — Ambulatory Visit: Payer: Medicare Other | Admitting: Hematology and Oncology

## 2013-01-25 ENCOUNTER — Ambulatory Visit: Payer: Medicare Other | Admitting: Internal Medicine

## 2013-01-25 DIAGNOSIS — Z0289 Encounter for other administrative examinations: Secondary | ICD-10-CM

## 2013-01-28 ENCOUNTER — Ambulatory Visit (INDEPENDENT_AMBULATORY_CARE_PROVIDER_SITE_OTHER): Payer: Medicare Other

## 2013-01-28 DIAGNOSIS — E538 Deficiency of other specified B group vitamins: Secondary | ICD-10-CM

## 2013-01-28 MED ORDER — CYANOCOBALAMIN 1000 MCG/ML IJ SOLN
1000.0000 ug | Freq: Once | INTRAMUSCULAR | Status: AC
  Administered 2013-01-28: 1000 ug via INTRAMUSCULAR

## 2013-01-30 NOTE — Progress Notes (Signed)
This office visit encounter was used for a nurse visit only.

## 2013-02-01 ENCOUNTER — Telehealth: Payer: Self-pay | Admitting: Internal Medicine

## 2013-02-01 DIAGNOSIS — M1711 Unilateral primary osteoarthritis, right knee: Secondary | ICD-10-CM

## 2013-02-01 MED ORDER — PREDNISONE 10 MG PO TABS: ORAL_TABLET | ORAL | Status: DC

## 2013-02-01 NOTE — Telephone Encounter (Signed)
Pt called stating he is out of predniSONE (DELTASONE) wanted to know if some could be called in  Please advise if this can be done

## 2013-02-01 NOTE — Telephone Encounter (Signed)
Patient informed. 

## 2013-02-01 NOTE — Telephone Encounter (Signed)
Ok this time only 

## 2013-02-08 ENCOUNTER — Telehealth: Payer: Self-pay | Admitting: Hematology and Oncology

## 2013-02-08 NOTE — Telephone Encounter (Signed)
, °

## 2013-02-12 ENCOUNTER — Ambulatory Visit: Payer: Medicare Other | Admitting: Hematology and Oncology

## 2013-02-12 ENCOUNTER — Other Ambulatory Visit: Payer: Medicare Other

## 2013-02-14 ENCOUNTER — Other Ambulatory Visit: Payer: Self-pay

## 2013-02-18 ENCOUNTER — Telehealth: Payer: Self-pay | Admitting: Hematology and Oncology

## 2013-02-18 NOTE — Telephone Encounter (Signed)
Pt called and r/s lab and MD appt that was missed last 11/4th to 11/13th

## 2013-02-21 ENCOUNTER — Encounter: Payer: Self-pay | Admitting: Hematology and Oncology

## 2013-02-21 ENCOUNTER — Telehealth: Payer: Self-pay | Admitting: Hematology and Oncology

## 2013-02-21 ENCOUNTER — Ambulatory Visit (HOSPITAL_BASED_OUTPATIENT_CLINIC_OR_DEPARTMENT_OTHER): Payer: Medicare Other | Admitting: Hematology and Oncology

## 2013-02-21 ENCOUNTER — Other Ambulatory Visit (HOSPITAL_BASED_OUTPATIENT_CLINIC_OR_DEPARTMENT_OTHER): Payer: Medicare Other | Admitting: Lab

## 2013-02-21 VITALS — BP 123/79 | HR 73 | Temp 97.6°F | Resp 19 | Ht 71.5 in | Wt 168.4 lb

## 2013-02-21 DIAGNOSIS — D649 Anemia, unspecified: Secondary | ICD-10-CM | POA: Insufficient documentation

## 2013-02-21 DIAGNOSIS — D7589 Other specified diseases of blood and blood-forming organs: Secondary | ICD-10-CM

## 2013-02-21 DIAGNOSIS — D472 Monoclonal gammopathy: Secondary | ICD-10-CM

## 2013-02-21 DIAGNOSIS — E538 Deficiency of other specified B group vitamins: Secondary | ICD-10-CM

## 2013-02-21 LAB — CBC WITH DIFFERENTIAL/PLATELET
BASO%: 0.4 % (ref 0.0–2.0)
EOS%: 1.1 % (ref 0.0–7.0)
HCT: 38 % — ABNORMAL LOW (ref 38.4–49.9)
LYMPH%: 28.5 % (ref 14.0–49.0)
MCH: 34.1 pg — ABNORMAL HIGH (ref 27.2–33.4)
MCHC: 33.9 g/dL (ref 32.0–36.0)
NEUT%: 63.3 % (ref 39.0–75.0)
Platelets: 253 10*3/uL (ref 140–400)

## 2013-02-21 LAB — COMPREHENSIVE METABOLIC PANEL (CC13)
ALT: 44 U/L (ref 0–55)
AST: 30 U/L (ref 5–34)
Alkaline Phosphatase: 72 U/L (ref 40–150)
Creatinine: 1 mg/dL (ref 0.7–1.3)
Total Bilirubin: 1.13 mg/dL (ref 0.20–1.20)

## 2013-02-21 NOTE — Progress Notes (Signed)
[facility] OFFICE PROGRESS NOTE  Patient Care Team: Nathan Levins, MD as PCP - General Nathan Parody, MD (Hematology and Oncology) Nathan Gain, MD as Attending Physician (Neurology) Temple Pacini, MD (Neurosurgery)  DIAGNOSIS: Monoclonal gammopathy of unknown significance, history of B12 deficiency anemia  SUMMARY OF ONCOLOGIC HISTORY: This is a pleasant 62 year old gentleman who was diagnosed with monoclonal gammopathy of unknown significance last year. The patient has history of anemia. Subsequent evaluation found that the patient had B12 deficiency. He was placed on B12 replacement therapy. 24-hour urine collection x-ray and blood work did not show evidence of end organ damage and he is being observed.  INTERVAL HISTORY: Nathan Wallace 62 y.o. male returns for further followup. The patient denies any recent signs or symptoms of bleeding such as spontaneous epistaxis, hematuria or hematochezia. He denies any recent fever, chills, night sweats or abnormal weight loss He has chronic arthritis affecting his left shoulder. Denies any new bone pain.  I have reviewed the past medical history, past surgical history, social history and family history with the patient and they are unchanged from previous note.  ALLERGIES:  has No Known Allergies.  MEDICATIONS:  Current Outpatient Prescriptions  Medication Sig Dispense Refill  . acetaminophen (TYLENOL) 325 MG tablet Take 650 mg by mouth every 6 (six) hours as needed. For pain      . allopurinol (ZYLOPRIM) 100 MG tablet Take 1 tablet (100 mg total) by mouth daily.  90 tablet  3  . amLODipine (NORVASC) 5 MG tablet Take 1 tablet (5 mg total) by mouth daily.  90 tablet  3  . aspirin 81 MG chewable tablet Chew 81 mg by mouth daily.      Marland Kitchen atorvastatin (LIPITOR) 40 MG tablet TAKE 1 TABLET (40 MG TOTAL) BY MOUTH DAILY.  90 tablet  2  . gabapentin (NEURONTIN) 300 MG capsule Take 1-2 capsules (300-600 mg total) by mouth at bedtime as  needed. For leg cramping.  180 capsule  1  . irbesartan (AVAPRO) 300 MG tablet Take 1 tablet (300 mg total) by mouth at bedtime.  90 tablet  3  . sildenafil (REVATIO) 20 MG tablet Take 20 mg by mouth daily as needed.      . traMADol (ULTRAM) 50 MG tablet Take 1 tablet (50 mg total) by mouth every 6 (six) hours as needed for pain.  60 tablet  1  . CIALIS 20 MG tablet TAKE 1 TABLET (20 MG TOTAL) BY MOUTH DAILY AS NEEDED FOR ERECTILE DYSFUNCTION.  10 tablet  0  . HYDROcodone-homatropine (HYCODAN) 5-1.5 MG/5ML syrup Take 5 mLs by mouth every 6 (six) hours as needed for cough.  120 mL  1   No current facility-administered medications for this visit.    REVIEW OF SYSTEMS:   Constitutional: Denies fevers, chills or abnormal weight loss Eyes: Denies blurriness of vision Ears, nose, mouth, throat, and face: Denies mucositis or sore throat Respiratory: Denies cough, dyspnea or wheezes Cardiovascular: Denies palpitation, chest discomfort or lower extremity swelling Gastrointestinal:  Denies nausea, heartburn or change in bowel habits Skin: Denies abnormal skin rashes Lymphatics: Denies new lymphadenopathy or easy bruising Neurological:Denies numbness, tingling or new weaknesses Behavioral/Psych: Mood is stable, no new changes  All other systems were reviewed with the patient and are negative.  PHYSICAL EXAMINATION: ECOG PERFORMANCE STATUS: 1 - Symptomatic but completely ambulatory  Filed Vitals:   02/21/13 1226  BP: 123/79  Pulse: 73  Temp: 97.6 F (36.4 C)  Resp:  19   Filed Weights   02/21/13 1226  Weight: 168 lb 6.4 oz (76.386 kg)    GENERAL:alert, no distress and comfortable SKIN: skin color, texture, turgor are normal, no rashes or significant lesions EYES: normal, Conjunctiva are pink and non-injected, sclera clear OROPHARYNX:no exudate, no erythema and lips, buccal mucosa, and tongue normal  NECK: supple, thyroid normal size, non-tender, without nodularity LYMPH:  no palpable  lymphadenopathy in the cervical, axillary or inguinal LUNGS: clear to auscultation and percussion with normal breathing effort HEART: regular rate & rhythm and no murmurs and no lower extremity edema ABDOMEN:abdomen soft, non-tender and normal bowel sounds Musculoskeletal:no cyanosis of digits and no clubbing  NEURO: alert & oriented x 3 with fluent speech, no focal motor/sensory deficits  LABORATORY DATA:  I have reviewed the data as listed    Component Value Date/Time   NA 141 06/28/2012 1010   NA 143 02/23/2012 2253   K 3.9 06/28/2012 1010   K 3.1* 02/23/2012 2253   CL 104 06/28/2012 1010   CL 107 02/23/2012 2253   CO2 28 06/28/2012 1010   CO2 29 12/13/2011 1549   GLUCOSE 127* 06/28/2012 1010   GLUCOSE 113* 02/23/2012 2253   BUN 14.5 06/28/2012 1010   BUN 10 02/23/2012 2253   CREATININE 1.2 06/28/2012 1010   CREATININE 1.40* 02/23/2012 2253   CALCIUM 9.7 06/28/2012 1010   CALCIUM 9.4 12/13/2011 1549   PROT 8.0 06/28/2012 1010   PROT 7.7 12/19/2011 1627   ALBUMIN 4.1 06/28/2012 1010   ALBUMIN 4.5 12/19/2011 1627   AST 24 06/28/2012 1010   AST 35 12/19/2011 1627   ALT 44 06/28/2012 1010   ALT 48 12/19/2011 1627   ALKPHOS 73 06/28/2012 1010   ALKPHOS 56 12/19/2011 1627   BILITOT 1.16 06/28/2012 1010   BILITOT 1.4* 12/19/2011 1627   GFRNONAA 76* 11/03/2011 1645   GFRAA 88* 11/03/2011 1645    No results found for this basename: SPEP, UPEP,  kappa and lambda light chains    Lab Results  Component Value Date   WBC 7.5 02/21/2013   NEUTROABS 4.8 02/21/2013   HGB 12.9* 02/21/2013   HCT 38.0* 02/21/2013   MCV 100.5* 02/21/2013   PLT 253 02/21/2013      Chemistry      Component Value Date/Time   NA 141 06/28/2012 1010   NA 143 02/23/2012 2253   K 3.9 06/28/2012 1010   K 3.1* 02/23/2012 2253   CL 104 06/28/2012 1010   CL 107 02/23/2012 2253   CO2 28 06/28/2012 1010   CO2 29 12/13/2011 1549   BUN 14.5 06/28/2012 1010   BUN 10 02/23/2012 2253   CREATININE 1.2 06/28/2012 1010   CREATININE 1.40*  02/23/2012 2253      Component Value Date/Time   CALCIUM 9.7 06/28/2012 1010   CALCIUM 9.4 12/13/2011 1549   ALKPHOS 73 06/28/2012 1010   ALKPHOS 56 12/19/2011 1627   AST 24 06/28/2012 1010   AST 35 12/19/2011 1627   ALT 44 06/28/2012 1010   ALT 48 12/19/2011 1627   BILITOT 1.16 06/28/2012 1010   BILITOT 1.4* 12/19/2011 1627      ASSESSMENT & PLAN:  #1 MGUS He has no evidence of disease progression. He is mildly anemic but it is macrocytosis of suspected speech or related. We will observe. I will like to see him back in 6 months with history, blood work, urine test and x-ray. #2 anemia I suspect this is related to B12 deficiency. The  patient had no signs and symptoms of bleeding. #3 history of 12 deficiency He'll continue injection per primary care provider #4 preventive care The patient has not received influenza vaccination. He was to get it from his primary care provider #5 arthritis pain I recommend vitamin D supplementation.  Orders Placed This Encounter  Procedures  . DG Bone Survey Met    Standing Status: Future     Number of Occurrences:      Standing Expiration Date: 04/23/2014    Order Specific Question:  Reason for Exam (SYMPTOM  OR DIAGNOSIS REQUIRED)    Answer:  staging myeloma    Order Specific Question:  Preferred imaging location?    Answer:  [facility]  . CBC & Diff and Retic    Standing Status: Future     Number of Occurrences:      Standing Expiration Date: 02/21/2014  . Comprehensive metabolic panel    Standing Status: Future     Number of Occurrences:      Standing Expiration Date: 02/21/2014  . Beta 2 microglobuline, serum    Standing Status: Future     Number of Occurrences:      Standing Expiration Date: 02/21/2014  . IFE, Urine (with Tot Prot)    Standing Status: Future     Number of Occurrences:      Standing Expiration Date: 02/21/2014  . Immunofixation electrophoresis    Standing Status: Future     Number of Occurrences:      Standing  Expiration Date: 02/21/2014  . Immunofixation interpretive, urine    Standing Status: Future     Number of Occurrences:      Standing Expiration Date: 02/21/2014  . Kappa/lambda light chains    Standing Status: Future     Number of Occurrences:      Standing Expiration Date: 02/21/2014  . SPEP & IFE with QIG    Standing Status: Future     Number of Occurrences:      Standing Expiration Date: 02/21/2014  . Protein Electro, 24-Hour Urine    Standing Status: Future     Number of Occurrences:      Standing Expiration Date: 02/21/2014   All questions were answered. The patient knows to call the clinic with any problems, questions or concerns. No barriers to learning was detected. I spent 25 minutes counseling the patient face to face. The total time spent in the appointment was 40 minutes and more than 50% was on counseling and review of test results     [facility], Kimberle Stanfill, MD 02/21/2013 12:46 PM

## 2013-02-21 NOTE — Telephone Encounter (Signed)
Gave pt appt for lab and MD on May 2015 °

## 2013-02-25 LAB — PROTEIN ELECTROPHORESIS, SERUM
Alpha-1-Globulin: 3.9 % (ref 2.9–4.9)
Alpha-2-Globulin: 7.9 % (ref 7.1–11.8)
Beta 2: 6.2 % (ref 3.2–6.5)
Gamma Globulin: 16.9 % (ref 11.1–18.8)

## 2013-02-25 LAB — KAPPA/LAMBDA LIGHT CHAINS: Kappa free light chain: 2.37 mg/dL — ABNORMAL HIGH (ref 0.33–1.94)

## 2013-02-28 ENCOUNTER — Encounter: Payer: Self-pay | Admitting: Internal Medicine

## 2013-02-28 ENCOUNTER — Ambulatory Visit (INDEPENDENT_AMBULATORY_CARE_PROVIDER_SITE_OTHER): Payer: Medicare Other | Admitting: Internal Medicine

## 2013-02-28 ENCOUNTER — Other Ambulatory Visit (INDEPENDENT_AMBULATORY_CARE_PROVIDER_SITE_OTHER): Payer: Medicare Other

## 2013-02-28 VITALS — BP 130/88 | HR 61 | Temp 98.4°F | Ht 71.5 in | Wt 169.0 lb

## 2013-02-28 DIAGNOSIS — M25512 Pain in left shoulder: Secondary | ICD-10-CM

## 2013-02-28 DIAGNOSIS — M25519 Pain in unspecified shoulder: Secondary | ICD-10-CM

## 2013-02-28 DIAGNOSIS — E785 Hyperlipidemia, unspecified: Secondary | ICD-10-CM

## 2013-02-28 DIAGNOSIS — R7302 Impaired glucose tolerance (oral): Secondary | ICD-10-CM

## 2013-02-28 DIAGNOSIS — R7309 Other abnormal glucose: Secondary | ICD-10-CM

## 2013-02-28 DIAGNOSIS — Z23 Encounter for immunization: Secondary | ICD-10-CM

## 2013-02-28 DIAGNOSIS — Z0000 Encounter for general adult medical examination without abnormal findings: Secondary | ICD-10-CM

## 2013-02-28 DIAGNOSIS — E538 Deficiency of other specified B group vitamins: Secondary | ICD-10-CM

## 2013-02-28 LAB — HEMOGLOBIN A1C: Hgb A1c MFr Bld: 6.2 % (ref 4.6–6.5)

## 2013-02-28 LAB — LIPID PANEL
HDL: 59.5 mg/dL (ref >39.00)
LDL Cholesterol: 81 mg/dL (ref 0–99)
Total CHOL/HDL Ratio: 3

## 2013-02-28 MED ORDER — CYANOCOBALAMIN 1000 MCG/ML IJ SOLN
1000.0000 ug | Freq: Once | INTRAMUSCULAR | Status: AC
  Administered 2013-02-28: 1000 ug via INTRAMUSCULAR

## 2013-02-28 NOTE — Progress Notes (Signed)
Pre-visit discussion using our clinic review tool. No additional management support is needed unless otherwise documented below in the visit note.  

## 2013-02-28 NOTE — Assessment & Plan Note (Signed)

## 2013-02-28 NOTE — Assessment & Plan Note (Signed)
For b12 today 

## 2013-02-28 NOTE — Progress Notes (Signed)
Subjective:    Patient ID: Nathan Wallace, male    DOB: 13-Jul-1950, 62 y.o.   MRN: [id number]  HPI Here for wellness and f/u;  Overall doing ok;  Pt denies CP, worsening SOB, DOE, wheezing, orthopnea, PND, worsening LE edema, palpitations, dizziness or syncope.  Pt denies neurological change such as new headache, facial or extremity weakness.  Pt denies polydipsia, polyuria, or low sugar symptoms. Pt states overall good compliance with treatment and medications, good tolerability, and has been trying to follow lower cholesterol diet.  Pt denies worsening depressive symptoms, suicidal ideation or panic. No fever, night sweats, wt loss, loss of appetite, or other constitutional symptoms.  Pt states good ability with ADL's, has low fall risk, home safety reviewed and adequate, no other significant changes in hearing or vision, and only occasionally active with exercise. Had PSA in June 2014 per urology - low per pt, f/u 6 mo.Still sees Dr Gossage/heme/onc with recent neg m Spike for MGUS f/u, and finding mild anemia.  For flu shot today. And vit b12.  Advised to take vit d per dr Elease Etienne as well.  Also with ongoing left shoulder pain and reduced ROM persistent for several months.  Right shoulder had problems now resolved.  Has been on disability for 16 yrs with SSI and pension after C-spine fusion and lower back pain.  Had a small gout flare last wk resolved after an episode of drinking ETOH Past Medical History  Diagnosis Date  . Prostate cancer 2011  . Hyperlipidemia     border line  . GERD (gastroesophageal reflux disease)     NO RECENT PROBLEMS AND NO MEDS  . Arthritis     RT KNEE AND RT SHOULDER  . Impaired glucose tolerance 06/22/2011  . Degenerative joint disease of knee, right   . Benign head tremor   . Left inguinal hernia   . CVA (cerebrovascular accident due to intracerebral hemorrhage) 08/03/2011  . MGUS (monoclonal gammopathy of unknown significance)   . Syncope 2012    after prostate  surgery; neg neurology work up.   . Depression 07/26/2012   Past Surgical History  Procedure Laterality Date  . Spine surgery  2003 OR 2004    CERVICAL FUSION-STATES LIMITED ROM NECK  . Wrist surgery    . Inguinal hernia repair  06/01/2011    Procedure: HERNIA REPAIR INGUINAL ADULT;  Surgeon: Ernestene Mention, MD;  Location: [facility];  Service: General;  Laterality: Left;  Left Inguinal Hernia Repair with Mesh  . Robot assisted laparoscopic radical prostatectomy  jan 2012    da vinci    reports that he quit smoking about 46 years ago. He has never used smokeless tobacco. He reports that he drinks about 3.0 ounces of alcohol per week. He reports that he does not use illicit drugs. family history includes Aneurysm in his mother; Cancer in his father; Heart attack in his brother; Hypertension in his mother. No Known Allergies Current Outpatient Prescriptions on File Prior to Visit  Medication Sig Dispense Refill  . allopurinol (ZYLOPRIM) 100 MG tablet Take 1 tablet (100 mg total) by mouth daily.  90 tablet  3  . amLODipine (NORVASC) 5 MG tablet Take 1 tablet (5 mg total) by mouth daily.  90 tablet  3  . aspirin 81 MG chewable tablet Chew 81 mg by mouth daily.      Marland Kitchen atorvastatin (LIPITOR) 40 MG tablet TAKE 1 TABLET (40 MG TOTAL) BY MOUTH DAILY.  90 tablet  2  .  CIALIS 20 MG tablet TAKE 1 TABLET (20 MG TOTAL) BY MOUTH DAILY AS NEEDED FOR ERECTILE DYSFUNCTION.  10 tablet  0  . gabapentin (NEURONTIN) 300 MG capsule Take 1-2 capsules (300-600 mg total) by mouth at bedtime as needed. For leg cramping.  180 capsule  1  . irbesartan (AVAPRO) 300 MG tablet Take 1 tablet (300 mg total) by mouth at bedtime.  90 tablet  3  . sildenafil (REVATIO) 20 MG tablet Take 20 mg by mouth daily as needed.       No current facility-administered medications on file prior to visit.    Review of Systems Constitutional: Negative for diaphoresis, activity change, appetite change or unexpected weight change.  HENT:  Negative for hearing loss, ear pain, facial swelling, mouth sores and neck stiffness.   Eyes: Negative for pain, redness and visual disturbance.  Respiratory: Negative for shortness of breath and wheezing.   Cardiovascular: Negative for chest pain and palpitations.  Gastrointestinal: Negative for diarrhea, blood in stool, abdominal distention or other pain Genitourinary: Negative for hematuria, flank pain or change in urine volume.  Musculoskeletal: Negative for myalgias and joint swelling.  Skin: Negative for color change and wound.  Neurological: Negative for syncope and numbness. other than noted Hematological: Negative for adenopathy.  Psychiatric/Behavioral: Negative for hallucinations, self-injury, decreased concentration and agitation.      Objective:   Physical Exam BP 130/88  Pulse 61  Temp(Src) 98.4 F (36.9 C) (Oral)  Ht 5' 11.5" (1.816 m)  Wt 169 lb (76.658 kg)  BMI 23.24 kg/m2  SpO2 98% VS noted,  Constitutional: Pt is oriented to person, place, and time. Appears well-developed and well-nourished.  Head: Normocephalic and atraumatic.  Right Ear: External ear normal.  Left Ear: External ear normal.  Nose: Nose normal.  Mouth/Throat: Oropharynx is clear and moist.  Eyes: Conjunctivae and EOM are normal. Pupils are equal, round, and reactive to light.  Neck: Normal range of motion. Neck supple. No JVD present. No tracheal deviation present.  Cardiovascular: Normal rate, regular rhythm, normal heart sounds and intact distal pulses.   Pulmonary/Chest: Effort normal and breath sounds normal.  Abdominal: Soft. Bowel sounds are normal. There is no tenderness. No HSM  Musculoskeletal: Normal range of motion. Exhibits no edema.  Lymphadenopathy:  Has no cervical adenopathy.  Neurological: Pt is alert and oriented to person, place, and time. Pt has normal reflexes. No cranial nerve deficit.  Skin: Skin is warm and dry. No rash noted.  Psychiatric:  Has  normal mood and  affect. Behavior is normal.  Left shoulder with diffuse tender, reduced ROM to 100 degrees S/p c-spine fusion S/p- right wrist fusion Diffuse lumbar chronic mild tender    Assessment & Plan:

## 2013-02-28 NOTE — Patient Instructions (Signed)
You have an appointment tomorrow at 830 AM with Dr Katrinka Blazing for the left shoulder pain  You can change this as you leave today if this is not going to work out for you  You had the B12, and flu shots today  Please continue all other medications as before, and refills have been done if requested.  Please have the pharmacy call with any other refills you may need.  Please continue your efforts at being more active, low cholesterol diet, and weight control.  You are otherwise up to date with prevention measures today.  Please keep your appointments with your specialists as you have planned - urology, and hematology  Please go to the LAB in the Basement (turn left off the elevator) for the tests to be done today  You will be contacted by phone if any changes need to be made immediately.  Otherwise, you will receive a letter about your results with an explanation, but please check with MyChart first.  Please remember to sign up for My Chart if you have not done so, as this will be important to you in the future with finding out test results, communicating by private email, and scheduling acute appointments online when needed.  Please return in 1 year for your yearly visit, or sooner if needed

## 2013-02-28 NOTE — Assessment & Plan Note (Signed)
For sports med referral in the AM

## 2013-02-28 NOTE — Assessment & Plan Note (Signed)
stable overall by history and exam, recent data reviewed with pt, and pt to continue medical treatment as before,  to f/u any worsening symptoms or concerns Lab Results  Component Value Date   HGBA1C 5.9 12/13/2011

## 2013-03-01 ENCOUNTER — Other Ambulatory Visit (INDEPENDENT_AMBULATORY_CARE_PROVIDER_SITE_OTHER): Payer: Medicare Other

## 2013-03-01 ENCOUNTER — Ambulatory Visit (INDEPENDENT_AMBULATORY_CARE_PROVIDER_SITE_OTHER): Payer: Medicare Other | Admitting: Family Medicine

## 2013-03-01 ENCOUNTER — Encounter: Payer: Self-pay | Admitting: Family Medicine

## 2013-03-01 VITALS — BP 124/78 | HR 72 | Wt 169.0 lb

## 2013-03-01 DIAGNOSIS — M25512 Pain in left shoulder: Secondary | ICD-10-CM

## 2013-03-01 DIAGNOSIS — M25519 Pain in unspecified shoulder: Secondary | ICD-10-CM

## 2013-03-01 DIAGNOSIS — M19019 Primary osteoarthritis, unspecified shoulder: Secondary | ICD-10-CM

## 2013-03-01 DIAGNOSIS — M12812 Other specific arthropathies, not elsewhere classified, left shoulder: Secondary | ICD-10-CM | POA: Insufficient documentation

## 2013-03-01 NOTE — Progress Notes (Signed)
Pre-visit discussion using our clinic review tool. No additional management support is needed unless otherwise documented below in the visit note.  

## 2013-03-01 NOTE — Progress Notes (Signed)
I'm seeing this patient by the request  of:  Oliver Barre, MD  CC: Left shoulder pain  HPI: Patient is a very pleasant 62 year old right-hand-dominant male coming in with left shoulder pain. Patient does have a past medical history significant for a stroke patient states he's had this pain intermittently for multiple years. Patient was doing a lot of activity putting plastic up for painting. Patient states that afterwards he felt like he was somewhat weak in his left arm. This is approximately 1 week ago. Now his arm hurts with any type of overhead activity. Patient states it does wake him up at night. Patient has hadn't changed a lot of significant activities secondary to pain. Patient denies trying anyhow modalities such as ice or heat. Patient rates the pain as 8/10. Describes the pain is more of a dull aching sensation with sharp pain with certain movements.  Past medical, surgical, family and social history reviewed. Medications reviewed all in the electronic medical record.   Review of Systems: No headache, visual changes, nausea, vomiting, diarrhea, constipation, dizziness, abdominal pain, skin rash, fevers, chills, night sweats, weight loss, swollen lymph nodes, body aches, joint swelling, muscle aches, chest pain, shortness of breath, mood changes.   Objective:    Blood pressure 124/78, pulse 72, weight 169 lb (76.658 kg), SpO2 95.00%.   General: No apparent distress alert and oriented x3 mood and affect normal, dressed appropriately.  HEENT: Pupils equal, extraocular movements intact Respiratory: Patient's speak in full sentences and does not appear short of breath Cardiovascular: No lower extremity edema, non tender, no erythema Skin: Warm dry intact with no signs of infection or rash on extremities or on axial skeleton. Abdomen: Soft nontender Neuro: Cranial nerves II through XII are intact, neurovascularly intact in all extremities with 2+ DTRs and 2+ pulses. Lymph: No  lymphadenopathy of posterior or anterior cervical chain or axillae bilaterally.  Gait normal with good balance and coordination.  MSK: Non tender with full range of motion and good stability and symmetric strength and tone of shoulders, elbows, wrist, hip, knee and ankles bilaterally.  Shoulder: Left Inspection reveals no abnormalities, atrophy or asymmetry. Palpation is normal with no tenderness over AC joint or bicipital groove. ROM is limited in active range of motion to forward flexion of 160, internal rotation to L5, but full passive range of motion. Rotator cuff strength 4/5 with resisted external rotation Positive signs of impingement with negative Neer and Hawkin's tests, empty can sign. Speeds and Yergason's tests normal. No labral pathology noted with negative Obrien's, negative clunk and good stability. Normal scapular function observed. No painful arc and no drop arm sign. No apprehension sign Contralateral side unremarkable  MSK US performed of: Left shoulder This study was ordered, performed, and interpreted by Terrilee Files D.O.  Shoulder:   Supraspinatus:  Patient does have a small intersubstance tear of the supraspinatus that measures 0.3 cm in diameter. This involves approximately 15% of the tendon. Mild hypoechoic changes. Patient does have some underlying arthritis of the humerus.  Infraspinatus:  Appears normal on long and transverse views. Mild hypoechoic changes surrounding the area. Subscapularis:  Appears normal on long and transverse views. Patient does have significant bursitis in this area. No true tear appreciated Teres Minor:  Appears normal on long and transverse views. AC joint:  Capsule distended, mild geyser sign. Glenohumeral Joint:  Appears normal without effusion. Glenoid Labrum:  Intact without visualized tears. Biceps Tendon:  Appears normal on long and transverse views, no fraying of tendon,  tendon located in intertubercular groove, no subluxation with  shoulder internal or external rotation. No increased power doppler signal.   Impression and Recommendations:     This case required medical decision making of moderate complexity.

## 2013-03-01 NOTE — Assessment & Plan Note (Signed)
Procedure: Real-time Ultrasound Guided Injection of left glenohumeral joint Device: GE Logiq E  Ultrasound guided injection is preferred based studies that show increased duration, increased effect, greater accuracy, decreased procedural pain, increased response rate with ultrasound guided versus blind injection.  Verbal informed consent obtained.  Time-out conducted.  Noted no overlying erythema, induration, or other signs of local infection.  Skin prepped in a sterile fashion.  Local anesthesia: Topical Ethyl chloride.  With sterile technique and under real time ultrasound guidance:  Joint visualized.  23g 1  inch needle inserted posterior approach. Pictures taken for needle placement. Patient did have injection of 2 cc of 1% lidocaine, 2 cc of 0.5% Marcaine, and 1cc of Kenalog 40 mg/dL. Completed without difficulty  Pain immediately resolved suggesting accurate placement of the medication.  Advised to call if fevers/chills, erythema, induration, drainage, or persistent bleeding.  Images permanently stored and available for review in the ultrasound unit.  Impression: Technically successful ultrasound guided injection.  Of note patient has had a fusion of the cervical spine secondary to an injury previously. This does not appear to be radiculopathy. Patient tolerated procedure above well. Patient had near complete resolution of pain. Discussed treatment options diagnosis and prognosis. Home exercise program given including theraband Icing protocol Avoid anti-inflammatories secondary to his comorbidities. Patient will return again in 3 weeks for further evaluation. Patient's underlying monoclonal gammopathy may consider imaging earlier on.

## 2013-03-01 NOTE — Patient Instructions (Signed)
Great to meet you Take weekend off. Enjoy the game Start exercises on Monday and do daily.  ICe 20 minutes 2 times a day can help Come back in 3 weeks.

## 2013-03-21 ENCOUNTER — Ambulatory Visit: Payer: Medicare Other | Admitting: Family Medicine

## 2013-03-21 DIAGNOSIS — Z0289 Encounter for other administrative examinations: Secondary | ICD-10-CM

## 2013-04-01 ENCOUNTER — Ambulatory Visit (INDEPENDENT_AMBULATORY_CARE_PROVIDER_SITE_OTHER): Payer: Medicare Other | Admitting: *Deleted

## 2013-04-01 DIAGNOSIS — E538 Deficiency of other specified B group vitamins: Secondary | ICD-10-CM

## 2013-04-01 MED ORDER — CYANOCOBALAMIN 1000 MCG/ML IJ SOLN
1000.0000 ug | Freq: Once | INTRAMUSCULAR | Status: AC
  Administered 2013-04-01: 1000 ug via INTRAMUSCULAR

## 2013-04-26 ENCOUNTER — Other Ambulatory Visit: Payer: Self-pay | Admitting: Internal Medicine

## 2013-05-02 ENCOUNTER — Ambulatory Visit (INDEPENDENT_AMBULATORY_CARE_PROVIDER_SITE_OTHER): Payer: Medicare Other | Admitting: *Deleted

## 2013-05-02 DIAGNOSIS — E538 Deficiency of other specified B group vitamins: Secondary | ICD-10-CM

## 2013-05-02 MED ORDER — CYANOCOBALAMIN 1000 MCG/ML IJ SOLN
1000.0000 ug | Freq: Once | INTRAMUSCULAR | Status: AC
  Administered 2013-05-02: 1000 ug via INTRAMUSCULAR

## 2013-05-09 ENCOUNTER — Other Ambulatory Visit: Payer: Self-pay | Admitting: Internal Medicine

## 2013-06-03 ENCOUNTER — Ambulatory Visit (INDEPENDENT_AMBULATORY_CARE_PROVIDER_SITE_OTHER): Payer: Medicare Other | Admitting: *Deleted

## 2013-06-03 DIAGNOSIS — E538 Deficiency of other specified B group vitamins: Secondary | ICD-10-CM

## 2013-06-03 MED ORDER — CYANOCOBALAMIN 1000 MCG/ML IJ SOLN
1000.0000 ug | Freq: Once | INTRAMUSCULAR | Status: AC
  Administered 2013-06-03: 1000 ug via INTRAMUSCULAR

## 2013-07-01 ENCOUNTER — Ambulatory Visit (INDEPENDENT_AMBULATORY_CARE_PROVIDER_SITE_OTHER): Payer: Medicare Other | Admitting: *Deleted

## 2013-07-01 DIAGNOSIS — E538 Deficiency of other specified B group vitamins: Secondary | ICD-10-CM

## 2013-07-01 MED ORDER — CYANOCOBALAMIN 1000 MCG/ML IJ SOLN
1000.0000 ug | Freq: Once | INTRAMUSCULAR | Status: AC
  Administered 2013-07-01: 1000 ug via INTRAMUSCULAR

## 2013-07-09 ENCOUNTER — Ambulatory Visit (INDEPENDENT_AMBULATORY_CARE_PROVIDER_SITE_OTHER): Payer: Medicare Other | Admitting: Internal Medicine

## 2013-07-09 ENCOUNTER — Encounter: Payer: Self-pay | Admitting: Internal Medicine

## 2013-07-09 VITALS — BP 130/84 | HR 64 | Temp 97.9°F | Wt 169.0 lb

## 2013-07-09 DIAGNOSIS — M25512 Pain in left shoulder: Secondary | ICD-10-CM

## 2013-07-09 DIAGNOSIS — I10 Essential (primary) hypertension: Secondary | ICD-10-CM

## 2013-07-09 DIAGNOSIS — M25519 Pain in unspecified shoulder: Secondary | ICD-10-CM

## 2013-07-09 DIAGNOSIS — E785 Hyperlipidemia, unspecified: Secondary | ICD-10-CM

## 2013-07-09 MED ORDER — TRAMADOL HCL 50 MG PO TABS: 50.0000 mg | ORAL_TABLET | Freq: Three times a day (TID) | ORAL | Status: DC | PRN

## 2013-07-09 NOTE — Assessment & Plan Note (Signed)
C/w worsening left rot cuff arthropathy most likely, for tramadol qhs prn as this is the most worst time of being unable to symtpomatically tolerate, also refer back to Dr Smith/[facility] for f/u exam.

## 2013-07-09 NOTE — Assessment & Plan Note (Signed)
D/w pt, ok to cont statin, as unlikely to be related to current shoulder pain Lab Results  Component Value Date   LDLCALC 81 02/28/2013

## 2013-07-09 NOTE — Patient Instructions (Signed)
Please continue all other medications as before, and refills have been done if requested. Please have the pharmacy call with any other refills you may need.  You will be contacted regarding the referral for: Dr Tamala Julian for the left shoulder follow up  Please continue all other medications as before, and refills have been done if requested. Please have the pharmacy call with any other refills you may need.

## 2013-07-09 NOTE — Progress Notes (Signed)
Subjective:    Patient ID: Nathan Wallace, male    DOB: May 31, 1950, 63 y.o.   MRN: [id number]  HPI  Here to f/u, overall doing ok, did see Dr Tamala Julian for left shoulder pain, with dx listed as rot cuff arthroopathy but pt denies pain related to rot cuff.  had cortisone no help, xray neg per pt, though I only see u/s documented.  Had scheduling conflict for f/u with [facility], so has not f/u to date. Has some cognitive difficulty s/p CVA.  Now with worsening left shoulder pain in the past several wks, much worse to abduct and has to push the arm up with the right arm to be able to raise higher than horizontal at shoulder level.  Was suggested by [facility] Call nurse this might be pain related to his statin. Past Medical History  Diagnosis Date  . Prostate cancer 2011  . Hyperlipidemia     border line  . GERD (gastroesophageal reflux disease)     NO RECENT PROBLEMS AND NO MEDS  . Arthritis     RT KNEE AND RT SHOULDER  . Impaired glucose tolerance 06/22/2011  . Degenerative joint disease of knee, right   . Benign head tremor   . Left inguinal hernia   . CVA (cerebrovascular accident due to intracerebral hemorrhage) 08/03/2011  . MGUS (monoclonal gammopathy of unknown significance)   . Syncope 2012    after prostate surgery; neg neurology work up.   . Depression 07/26/2012   Past Surgical History  Procedure Laterality Date  . Spine surgery  2003 OR 2004    CERVICAL FUSION-STATES LIMITED ROM NECK  . Wrist surgery    . Inguinal hernia repair  06/01/2011    Procedure: HERNIA REPAIR INGUINAL ADULT;  Surgeon: Adin Hector, MD;  Location: [facility];  Service: General;  Laterality: Left;  Left Inguinal Hernia Repair with Mesh  . Robot assisted laparoscopic radical prostatectomy  jan 2012    da vinci    reports that he quit smoking about 47 years ago. He has never used smokeless tobacco. He reports that he drinks about 3.0 ounces of alcohol per week. He reports that he  does not use illicit drugs. family history includes Aneurysm in his mother; Cancer in his father; Heart attack in his brother; Hypertension in his mother. No Known Allergies Current Outpatient Prescriptions on File Prior to Visit  Medication Sig Dispense Refill  . allopurinol (ZYLOPRIM) 100 MG tablet Take 1 tablet (100 mg total) by mouth daily.  90 tablet  3  . amLODipine (NORVASC) 5 MG tablet TAKE 1 TABLET (5 MG TOTAL) BY MOUTH DAILY.  90 tablet  2  . aspirin 81 MG chewable tablet Chew 81 mg by mouth daily.      Marland Kitchen atorvastatin (LIPITOR) 40 MG tablet TAKE 1 TABLET (40 MG TOTAL) BY MOUTH DAILY.  90 tablet  3  . CIALIS 20 MG tablet TAKE 1 TABLET (20 MG TOTAL) BY MOUTH DAILY AS NEEDED FOR ERECTILE DYSFUNCTION.  10 tablet  0  . gabapentin (NEURONTIN) 300 MG capsule Take 1-2 capsules (300-600 mg total) by mouth at bedtime as needed. For leg cramping.  180 capsule  1  . irbesartan (AVAPRO) 300 MG tablet Take 1 tablet (300 mg total) by mouth at bedtime.  90 tablet  3  . sildenafil (REVATIO) 20 MG tablet Take 20 mg by mouth daily as needed.       No current facility-administered medications on  file prior to visit.       Review of Systems  Constitutional: Negative for unexpected weight change, or unusual diaphoresis  HENT: Negative for tinnitus.   Eyes: Negative for photophobia and visual disturbance.  Respiratory: Negative for choking and stridor.   Gastrointestinal: Negative for vomiting and blood in stool.  Genitourinary: Negative for hematuria and decreased urine volume.  Musculoskeletal: Negative for acute joint swelling Skin: Negative for color change and wound.  Neurological: Negative for tremors and numbness other than noted  Psychiatric/Behavioral: Negative for decreased concentration or  hyperactivity.       Objective:   Physical Exam BP 130/84  Pulse 64  Temp(Src) 97.9 F (36.6 C) (Oral)  Wt 169 lb (76.658 kg)  SpO2 97% VS noted,  Constitutional: Pt appears well-developed and  well-nourished.  HENT: Head: NCAT.  Right Ear: External ear normal.  Left Ear: External ear normal.  Eyes: Conjunctivae and EOM are normal. Pupils are equal, round, and reactive to light.  Neck: Normal range of motion. Neck supple.  Cardiovascular: Normal rate and regular rhythm.   Pulmonary/Chest: Effort normal and breath sounds normal.  Left shoulder with mild diffuse tender, active abduction to 90 degrees only due to pain Neurological: Pt is alert. Motor 5/5 Skin: Skin is warm. No erythema.  Psychiatric: Pt behavior is normal.     Assessment & Plan:

## 2013-07-09 NOTE — Progress Notes (Signed)
Pre visit review using our clinic review tool, if applicable. No additional management support is needed unless otherwise documented below in the visit note. 

## 2013-07-09 NOTE — Assessment & Plan Note (Signed)
stable overall by history and exam, recent data reviewed with pt, and pt to continue medical treatment as before,  to f/u any worsening symptoms or concerns BP Readings from Last 3 Encounters:  07/09/13 130/84  03/01/13 124/78  02/28/13 130/88

## 2013-07-16 ENCOUNTER — Ambulatory Visit (INDEPENDENT_AMBULATORY_CARE_PROVIDER_SITE_OTHER): Payer: Medicare Other | Admitting: Family Medicine

## 2013-07-16 ENCOUNTER — Encounter: Payer: Self-pay | Admitting: Family Medicine

## 2013-07-16 ENCOUNTER — Ambulatory Visit (INDEPENDENT_AMBULATORY_CARE_PROVIDER_SITE_OTHER)
Admission: RE | Admit: 2013-07-16 | Discharge: 2013-07-16 | Disposition: A | Discharge status: Home / Self Care | Payer: Medicare Other | Source: Ambulatory Visit | Attending: Family Medicine | Admitting: Family Medicine

## 2013-07-16 VITALS — BP 108/74 | HR 58

## 2013-07-16 DIAGNOSIS — M5412 Radiculopathy, cervical region: Secondary | ICD-10-CM

## 2013-07-16 DIAGNOSIS — M5010 Cervical disc disorder with radiculopathy, unspecified cervical region: Secondary | ICD-10-CM | POA: Insufficient documentation

## 2013-07-16 MED ORDER — GABAPENTIN 100 MG PO CAPS: 100.0000 mg | ORAL_CAPSULE | Freq: Two times a day (BID) | ORAL | Status: DC

## 2013-07-16 MED ORDER — CYCLOBENZAPRINE HCL 5 MG PO TABS: 5.0000 mg | ORAL_TABLET | Freq: Two times a day (BID) | ORAL | Status: DC | PRN

## 2013-07-16 NOTE — Assessment & Plan Note (Addendum)
Patient does have history of fusion and is having similar symptoms that could mean a C8 or T1 nerve root impingement. This would correspond with adjacent segment disease. X-rays ordered reviewed and interpreted by me today. Patient does have advanced degenerative disc disease of this C8 T1 region. Patient would like to  Avoid any type of surgery. We will try conservative therapy with increasing patient's gabapentin 200 mg during the a.m. and lunchtime and continuing 300 at night. Flexeril and tramadol as needed Home exercise program and will start formal physical therapy including considering home traction Patient will come back again in 2-4 weeks for further evaluation. Patient continues to have pain we may need to consider further imaging such as a CT scan to evaluate further.  Spent greater than 25 minutes with patient face-to-face and had greater than 50% of counseling including as described above in assessment and plan.

## 2013-07-16 NOTE — Progress Notes (Signed)
  [name] [address], [place] [address] Phone: [phone] Subjective:    I'm seeing this patient by the request  of:  Cathlean Cower, MD   CC: left shoulder pain.   [id number] Nathan Wallace is a 63 y.o. male coming in with complaint of left shoulder pain. Patient was seen previously and was diagnosed with a rotator cuff arthropathy with intersubstance tear of the supraspinatus. Patient did have an injection with some mild improvement. Of note patient does have a past medical history significant for C4 through C7 fusion. Patient states that a portal he continues to have pain on the left side. Patient continues on tramadol for pain relief. Patient also is on gabapentin and taking 1-2 pills as needed at bedtime. Patient states he continues to have a burning sensation mostly in the left arm the can go all the way to his middle finger as well as when he points to his ulnar aspect. Patient states it is similar to before his next surgery that he used to have radiculopathy down the right side. Patient did not think about this until he was asked. Patient states it is very difficult to get comfortable at night. Patient is noticing some mild weakness as well. Patient is a severity a 7/10. We did see patient previously and he did have some rotator cuff arthropathy and did not respond to an intra-articular injection.     Past medical history, social, surgical and family history all reviewed in electronic medical record.   Review of Systems: No headache, visual changes, nausea, vomiting, diarrhea, constipation, dizziness, abdominal pain, skin rash, fevers, chills, night sweats, weight loss, swollen lymph nodes, body aches, joint swelling, muscle aches, chest pain, shortness of breath, mood changes.   Objective Blood pressure 108/74, pulse 58, SpO2 98.00%.  General: No apparent distress alert and oriented x3 mood and affect normal, dressed appropriately.  HEENT:  Pupils equal, extraocular movements intact  Respiratory: Patient's speak in full sentences and does not appear short of breath  Cardiovascular: No lower extremity edema, non tender, no erythema  Skin: Warm dry intact with no signs of infection or rash on extremities or on axial skeleton.  Abdomen: Soft nontender  Neuro: Cranial nerves II through XII are intact, neurovascularly intact in all extremities with 2+ DTRs and 2+ pulses.  Lymph: No lymphadenopathy of posterior or anterior cervical chain or axillae bilaterally.  Gait normal with good balance and coordination.  MSK:  Non tender with full range of motion and good stability and symmetric strength and tone of  elbows, wrist, hip, knee and ankles bilaterally.  Neck: Inspection shows loss of lordosis No palpable stepoffs. Positive Spurling's maneuver. Limited range of motion in flexion-extension as well as side bending secondary to fusion Grip strength and sensation normal in bilateral hands Strength good C4 to T1 distribution No sensory change to C4 to T1 Negative Hoffman sign bilaterally Reflexes normal   Impression and Recommendations:     This case required medical decision making of moderate complexity.

## 2013-07-16 NOTE — Patient Instructions (Addendum)
Good to see you again Try exercises 3 times a week Physical therapy will be calling you Cervical neck xrays today as well.  Try gabapentin 100mg  in AM and at lunch.  Flexeril when having severe pain. Or tramadol for severe pain when you need it.  Come back again in 3 weeks.

## 2013-08-01 ENCOUNTER — Ambulatory Visit: Payer: Medicare Other

## 2013-08-01 ENCOUNTER — Ambulatory Visit (INDEPENDENT_AMBULATORY_CARE_PROVIDER_SITE_OTHER): Payer: Medicare Other | Admitting: *Deleted

## 2013-08-01 DIAGNOSIS — E538 Deficiency of other specified B group vitamins: Secondary | ICD-10-CM

## 2013-08-01 MED ORDER — CYANOCOBALAMIN 1000 MCG/ML IJ SOLN
1000.0000 ug | Freq: Once | INTRAMUSCULAR | Status: AC
  Administered 2013-08-01: 1000 ug via INTRAMUSCULAR

## 2013-08-02 ENCOUNTER — Encounter: Payer: Self-pay | Admitting: Gastroenterology

## 2013-08-06 ENCOUNTER — Encounter: Payer: Self-pay | Admitting: Family Medicine

## 2013-08-06 ENCOUNTER — Ambulatory Visit (INDEPENDENT_AMBULATORY_CARE_PROVIDER_SITE_OTHER): Payer: Medicare Other | Admitting: Family Medicine

## 2013-08-06 VITALS — BP 126/82 | HR 73 | Wt 168.0 lb

## 2013-08-06 DIAGNOSIS — M5010 Cervical disc disorder with radiculopathy, unspecified cervical region: Secondary | ICD-10-CM

## 2013-08-06 DIAGNOSIS — M5412 Radiculopathy, cervical region: Secondary | ICD-10-CM

## 2013-08-06 MED ORDER — TRAMADOL HCL 50 MG PO TABS: 50.0000 mg | ORAL_TABLET | Freq: Two times a day (BID) | ORAL | Status: DC

## 2013-08-06 MED ORDER — GABAPENTIN 100 MG PO CAPS: 100.0000 mg | ORAL_CAPSULE | Freq: Two times a day (BID) | ORAL | Status: DC

## 2013-08-06 MED ORDER — CYCLOBENZAPRINE HCL 5 MG PO TABS: 5.0000 mg | ORAL_TABLET | Freq: Two times a day (BID) | ORAL | Status: DC | PRN

## 2013-08-06 MED ORDER — GABAPENTIN 300 MG PO CAPS
300.0000 mg | ORAL_CAPSULE | Freq: Every evening | ORAL | Status: DC | PRN
Start: 2013-08-06 — End: 2013-10-04

## 2013-08-06 NOTE — Assessment & Plan Note (Signed)
Patient continues to improve but does have what seems to be focal findings of the T1 and C7 distribution of nerve impingement. Patient was doing very well and is adamant that he would like to avoid any intervention. I do not see any muscle atrophy and overall patient is actually increased his strength of the course of time. Patient encouraged to continue the medications on a regular basis as well as the exercises. Patient declines formal physical therapy again today. Patient was shown other exercises for neck strengthening exercises and can be helpful. Patient will follow up again in 6 weeks for further evaluation. At any time if he has any worsening pain he'll come in sooner and we'll consider further imaging. Spent greater than 25 minutes with patient face-to-face and had greater than 50% of counseling including as described above in assessment and plan.

## 2013-08-06 NOTE — Progress Notes (Signed)
  [name] [address], [place] [address] Phone: [phone] Subjective:    I'm seeing this patient by the request  of:  Cathlean Cower, MD   CC: left shoulder pain and neck pain  [id number] Nathan Wallace is a 63 y.o. male coming in with complaint of left shoulder pain and neck pain follow up. Of note patient does have a past medical history significant for C4 through C7 fusion. She was started on gabapentin at last visit and titrated up to 200 mg the morning, 200 mg in the afternoon 300 mg at night. Patient has been doing exercises he states overall he continues to improve slowly. Patient still notices some mild weakness of the left arm compared to the right arm but overall is able to do more activity. Patient is having no side effects to medications but has not been able to take Flexeril do to insurance problems. She denies any new symptoms. Patient states that he does sleep comfortably when he takes the medication at night.       Past medical history, social, surgical and family history all reviewed in electronic medical record.   Review of Systems: No headache, visual changes, nausea, vomiting, diarrhea, constipation, dizziness, abdominal pain, skin rash, fevers, chills, night sweats, weight loss, swollen lymph nodes, body aches, joint swelling, muscle aches, chest pain, shortness of breath, mood changes.   Objective Blood pressure 126/82, pulse 73, weight 168 lb (76.204 kg), SpO2 98.00%.  General: No apparent distress alert and oriented x3 mood and affect normal, dressed appropriately.  HEENT: Pupils equal, extraocular movements intact  Respiratory: Patient's speak in full sentences and does not appear short of breath  Cardiovascular: No lower extremity edema, non tender, no erythema  Skin: Warm dry intact with no signs of infection or rash on extremities or on axial skeleton.  Abdomen: Soft nontender  Neuro: Cranial nerves II through XII are  intact, neurovascularly intact in all extremities with 2+ DTRs and 2+ pulses.  Lymph: No lymphadenopathy of posterior or anterior cervical chain or axillae bilaterally.  Gait normal with good balance and coordination.  MSK:  Non tender with full range of motion and good stability and symmetric strength and tone of  elbows, wrist, hip, knee and ankles bilaterally.  Neck: Inspection shows loss of lordosis No palpable stepoffs. Positive Spurling's maneuver less pain than previous exam. Limited range of motion in flexion-extension as well as side bending secondary to fusion Grip strength and sensation normal in bilateral hands Strength good C4 to T1 distribution, with mild weakness of the T1 distribution compared to the contralateral side No sensory change to C4 to T1 Negative Hoffman sign bilaterally Reflexes normal   Impression and Recommendations:     This case required medical decision making of moderate complexity.  Spent greater than 25 minutes with patient face-to-face and had greater than 50% of counseling including as described above in assessment and plan.

## 2013-08-06 NOTE — Patient Instructions (Signed)
It is good to see you.  You have arthritis at the level below your fusion.  Continue the medications  Continue exercises 3 times a week.  See me again in 6 weeks.

## 2013-08-09 ENCOUNTER — Other Ambulatory Visit (HOSPITAL_BASED_OUTPATIENT_CLINIC_OR_DEPARTMENT_OTHER): Payer: Medicare Other

## 2013-08-09 ENCOUNTER — Ambulatory Visit (HOSPITAL_COMMUNITY)
Admission: RE | Admit: 2013-08-09 | Discharge: 2013-08-09 | Disposition: A | Discharge status: Home / Self Care | Payer: Medicare Other | Source: Ambulatory Visit | Attending: Hematology and Oncology | Admitting: Hematology and Oncology

## 2013-08-09 DIAGNOSIS — Z8546 Personal history of malignant neoplasm of prostate: Secondary | ICD-10-CM | POA: Insufficient documentation

## 2013-08-09 DIAGNOSIS — M47817 Spondylosis without myelopathy or radiculopathy, lumbosacral region: Secondary | ICD-10-CM | POA: Insufficient documentation

## 2013-08-09 DIAGNOSIS — D649 Anemia, unspecified: Secondary | ICD-10-CM

## 2013-08-09 DIAGNOSIS — M25519 Pain in unspecified shoulder: Secondary | ICD-10-CM | POA: Insufficient documentation

## 2013-08-09 DIAGNOSIS — D472 Monoclonal gammopathy: Secondary | ICD-10-CM | POA: Insufficient documentation

## 2013-08-09 DIAGNOSIS — M47812 Spondylosis without myelopathy or radiculopathy, cervical region: Secondary | ICD-10-CM | POA: Insufficient documentation

## 2013-08-09 LAB — COMPREHENSIVE METABOLIC PANEL (CC13)
ALBUMIN: 4.2 g/dL (ref 3.5–5.0)
ALK PHOS: 68 U/L (ref 40–150)
ALT: 39 U/L (ref 0–55)
AST: 27 U/L (ref 5–34)
Anion Gap: 11 mEq/L (ref 3–11)
BUN: 15.5 mg/dL (ref 7.0–26.0)
CALCIUM: 10.2 mg/dL (ref 8.4–10.4)
CHLORIDE: 107 meq/L (ref 98–109)
CO2: 26 mEq/L (ref 22–29)
Creatinine: 1.4 mg/dL — ABNORMAL HIGH (ref 0.7–1.3)
GLUCOSE: 127 mg/dL (ref 70–140)
Potassium: 3.7 mEq/L (ref 3.5–5.1)
Sodium: 144 mEq/L (ref 136–145)
TOTAL PROTEIN: 7.7 g/dL (ref 6.4–8.3)
Total Bilirubin: 1.21 mg/dL — ABNORMAL HIGH (ref 0.20–1.20)

## 2013-08-09 LAB — CBC & DIFF AND RETIC
BASO%: 0.2 % (ref 0.0–2.0)
BASOS ABS: 0 10*3/uL (ref 0.0–0.1)
EOS%: 1.6 % (ref 0.0–7.0)
Eosinophils Absolute: 0.1 10*3/uL (ref 0.0–0.5)
HEMATOCRIT: 40.1 % (ref 38.4–49.9)
HEMOGLOBIN: 13.6 g/dL (ref 13.0–17.1)
Immature Retic Fract: 7.5 % (ref 3.00–10.60)
LYMPH%: 30.2 % (ref 14.0–49.0)
MCH: 33.8 pg — ABNORMAL HIGH (ref 27.2–33.4)
MCHC: 33.9 g/dL (ref 32.0–36.0)
MCV: 99.8 fL — AB (ref 79.3–98.0)
MONO#: 0.5 10*3/uL (ref 0.1–0.9)
MONO%: 6.2 % (ref 0.0–14.0)
NEUT%: 61.8 % (ref 39.0–75.0)
NEUTROS ABS: 5 10*3/uL (ref 1.5–6.5)
Platelets: 287 10*3/uL (ref 140–400)
RBC: 4.02 10*6/uL — AB (ref 4.20–5.82)
RDW: 12.3 % (ref 11.0–14.6)
RETIC %: 1.68 % (ref 0.80–1.80)
Retic Ct Abs: 67.54 10*3/uL (ref 34.80–93.90)
WBC: 8.1 10*3/uL (ref 4.0–10.3)
lymph#: 2.4 10*3/uL (ref 0.9–3.3)

## 2013-08-13 LAB — UPEP/TP, 24-HR URINE
Collection Interval: 24 hours
Total Protein, Urine/Day: 30 mg/d — ABNORMAL LOW (ref 50–100)
Total Protein, Urine: 6 mg/dL
Total Volume, Urine: 500 mL

## 2013-08-13 LAB — SPEP & IFE WITH QIG
Albumin ELP: 56 % (ref 55.8–66.1)
Alpha-1-Globulin: 4.5 % (ref 2.9–4.9)
Alpha-2-Globulin: 8.8 % (ref 7.1–11.8)
BETA 2: 6.2 % (ref 3.2–6.5)
BETA GLOBULIN: 6.7 % (ref 4.7–7.2)
Gamma Globulin: 17.8 % (ref 11.1–18.8)
IGA: 261 mg/dL (ref 68–379)
IgG (Immunoglobin G), Serum: 1380 mg/dL (ref 650–1600)
IgM, Serum: 21 mg/dL — ABNORMAL LOW (ref 41–251)
M-SPIKE, %: 0.67 g/dL
Total Protein, Serum Electrophoresis: 7.1 g/dL (ref 6.0–8.3)

## 2013-08-13 LAB — BETA 2 MICROGLOBULIN, SERUM: Beta-2 Microglobulin: 2.22 mg/L (ref <=2.51)

## 2013-08-13 LAB — UIFE/LIGHT CHAINS/TP QN, 24-HR UR
Albumin, U: DETECTED
Alpha 1, Urine: DETECTED — AB
Alpha 2, Urine: DETECTED — AB
Beta, Urine: DETECTED — AB
Free Kappa Lt Chains,Ur: 36.4 mg/dL — ABNORMAL HIGH (ref 0.14–2.42)
Free Kappa/Lambda Ratio: 67.41 ratio — ABNORMAL HIGH (ref 2.04–10.37)
Free Lambda Excretion/Day: 2.7 mg/d
Free Lambda Lt Chains,Ur: 0.54 mg/dL (ref 0.02–0.67)
Free Lt Chn Excr Rate: 182 mg/d
Gamma Globulin, Urine: DETECTED — AB
Time: 24 hours
Total Protein, Urine-Ur/day: 188 mg/d — ABNORMAL HIGH (ref 10–140)
Total Protein, Urine: 37.6 mg/dL
Volume, Urine: 500 mL

## 2013-08-13 LAB — KAPPA/LAMBDA LIGHT CHAINS
KAPPA FREE LGHT CHN: 2.86 mg/dL — AB (ref 0.33–1.94)
KAPPA LAMBDA RATIO: 3.08 — AB (ref 0.26–1.65)
LAMBDA FREE LGHT CHN: 0.93 mg/dL (ref 0.57–2.63)

## 2013-08-21 ENCOUNTER — Encounter: Payer: Self-pay | Admitting: Hematology and Oncology

## 2013-08-21 ENCOUNTER — Ambulatory Visit (HOSPITAL_BASED_OUTPATIENT_CLINIC_OR_DEPARTMENT_OTHER): Payer: Medicare Other | Admitting: Hematology and Oncology

## 2013-08-21 ENCOUNTER — Other Ambulatory Visit: Payer: Self-pay | Admitting: Hematology and Oncology

## 2013-08-21 VITALS — BP 118/76 | HR 68 | Temp 97.7°F | Resp 18 | Ht 71.0 in | Wt 167.2 lb

## 2013-08-21 DIAGNOSIS — Z8546 Personal history of malignant neoplasm of prostate: Secondary | ICD-10-CM

## 2013-08-21 DIAGNOSIS — D472 Monoclonal gammopathy: Secondary | ICD-10-CM

## 2013-08-21 DIAGNOSIS — R944 Abnormal results of kidney function studies: Secondary | ICD-10-CM

## 2013-08-21 DIAGNOSIS — E538 Deficiency of other specified B group vitamins: Secondary | ICD-10-CM

## 2013-08-21 NOTE — Progress Notes (Signed)
[place] OFFICE PROGRESS NOTE  Patient Care Team: Biagio Borg, MD as PCP - General Clearnce Sorrel, MD as Attending Physician (Neurology) Charlie Pitter, MD (Neurosurgery) Heath Lark, MD as Consulting Physician (Hematology and Oncology)  DIAGNOSIS: Resected prostate cancer, monoclonal gammopathy of unknown significance and vitamin B12 deficiency anemia  SUMMARY OF ONCOLOGIC HISTORY: This is a pleasant 63 year old gentleman who was diagnosed with monoclonal gammopathy of unknown significance in 2013. The patient has history of anemia. Subsequent evaluation found that the patient had B12 deficiency. He was placed on B12 replacement therapy. 24-hour urine collection x-ray and blood work did not show evidence of end organ damage and he is being observed. He also has history of prostate cancer status post resection. His follow closely with his urologist.  INTERVAL HISTORY: Nathan Wallace 63 y.o. male returns for further followup. He denies any new bone pain. No recent infection. He continued to receive vitamin B12 supplement injection through his primary care provider.  I have reviewed the past medical history, past surgical history, social history and family history with the patient and they are unchanged from previous note.  ALLERGIES:  has No Known Allergies.  MEDICATIONS:  Current Outpatient Prescriptions  Medication Sig Dispense Refill  . allopurinol (ZYLOPRIM) 100 MG tablet Take 1 tablet (100 mg total) by mouth daily.  90 tablet  3  . amLODipine (NORVASC) 5 MG tablet TAKE 1 TABLET (5 MG TOTAL) BY MOUTH DAILY.  90 tablet  2  . aspirin 81 MG chewable tablet Chew 81 mg by mouth daily.      Marland Kitchen atorvastatin (LIPITOR) 40 MG tablet TAKE 1 TABLET (40 MG TOTAL) BY MOUTH DAILY.  90 tablet  3  . cyclobenzaprine (FLEXERIL) 5 MG tablet Take 1 tablet (5 mg total) by mouth 2 (two) times daily as needed for muscle spasms.  180 tablet  1  . gabapentin (NEURONTIN) 100 MG capsule Take 1  capsule (100 mg total) by mouth 2 (two) times daily.  180 capsule  3  . gabapentin (NEURONTIN) 300 MG capsule Take 1-2 capsules (300-600 mg total) by mouth at bedtime as needed. For leg cramping.  180 capsule  1  . irbesartan (AVAPRO) 300 MG tablet Take 1 tablet (300 mg total) by mouth at bedtime.  90 tablet  3  . traMADol (ULTRAM) 50 MG tablet Take 1 tablet (50 mg total) by mouth 2 (two) times daily.  60 tablet  2   No current facility-administered medications for this visit.    REVIEW OF SYSTEMS:   Constitutional: Denies fevers, chills or abnormal weight loss Eyes: Denies blurriness of vision Ears, nose, mouth, throat, and face: Denies mucositis or sore throat Respiratory: Denies cough, dyspnea or wheezes Cardiovascular: Denies palpitation, chest discomfort or lower extremity swelling Gastrointestinal:  Denies nausea, heartburn or change in bowel habits Skin: Denies abnormal skin rashes Lymphatics: Denies new lymphadenopathy or easy bruising Neurological:Denies numbness, tingling or new weaknesses Behavioral/Psych: Mood is stable, no new changes  All other systems were reviewed with the patient and are negative.  PHYSICAL EXAMINATION: ECOG PERFORMANCE STATUS: 0 - Asymptomatic  Filed Vitals:   08/21/13 0825  BP: 118/76  Pulse: 68  Temp: 97.7 F (36.5 C)  Resp: 18   Filed Weights   08/21/13 0825  Weight: 167 lb 3.2 oz (75.841 kg)    GENERAL:alert, no distress and comfortable SKIN: skin color, texture, turgor are normal, no rashes or significant lesions EYES: normal, Conjunctiva are pink and non-injected, sclera clear  Musculoskeletal:no cyanosis of digits and no clubbing  NEURO: alert & oriented x 3 with fluent speech, no focal motor/sensory deficits  LABORATORY DATA:  I have reviewed the data as listed    Component Value Date/Time   NA 144 08/09/2013 0846   NA 143 02/23/2012 2253   K 3.7 08/09/2013 0846   K 3.1* 02/23/2012 2253   CL 104 06/28/2012 1010   CL 107  02/23/2012 2253   CO2 26 08/09/2013 0846   CO2 29 12/13/2011 1549   GLUCOSE 127 08/09/2013 0846   GLUCOSE 127* 06/28/2012 1010   GLUCOSE 113* 02/23/2012 2253   BUN 15.5 08/09/2013 0846   BUN 10 02/23/2012 2253   CREATININE 1.4* 08/09/2013 0846   CREATININE 1.40* 02/23/2012 2253   CALCIUM 10.2 08/09/2013 0846   CALCIUM 9.4 12/13/2011 1549   PROT 7.7 08/09/2013 0846   PROT 7.7 12/19/2011 1627   ALBUMIN 4.2 08/09/2013 0846   ALBUMIN 4.5 12/19/2011 1627   AST 27 08/09/2013 0846   AST 35 12/19/2011 1627   ALT 39 08/09/2013 0846   ALT 48 12/19/2011 1627   ALKPHOS 68 08/09/2013 0846   ALKPHOS 56 12/19/2011 1627   BILITOT 1.21* 08/09/2013 0846   BILITOT 1.4* 12/19/2011 1627   GFRNONAA 76* 11/03/2011 1645   GFRAA 88* 11/03/2011 1645    No results found for this basename: SPEP,  UPEP,   kappa and lambda light chains    Lab Results  Component Value Date   WBC 8.1 08/09/2013   NEUTROABS 5.0 08/09/2013   HGB 13.6 08/09/2013   HCT 40.1 08/09/2013   MCV 99.8* 08/09/2013   PLT 287 08/09/2013      Chemistry      Component Value Date/Time   NA 144 08/09/2013 0846   NA 143 02/23/2012 2253   K 3.7 08/09/2013 0846   K 3.1* 02/23/2012 2253   CL 104 06/28/2012 1010   CL 107 02/23/2012 2253   CO2 26 08/09/2013 0846   CO2 29 12/13/2011 1549   BUN 15.5 08/09/2013 0846   BUN 10 02/23/2012 2253   CREATININE 1.4* 08/09/2013 0846   CREATININE 1.40* 02/23/2012 2253      Component Value Date/Time   CALCIUM 10.2 08/09/2013 0846   CALCIUM 9.4 12/13/2011 1549   ALKPHOS 68 08/09/2013 0846   ALKPHOS 56 12/19/2011 1627   AST 27 08/09/2013 0846   AST 35 12/19/2011 1627   ALT 39 08/09/2013 0846   ALT 48 12/19/2011 1627   BILITOT 1.21* 08/09/2013 0846   BILITOT 1.4* 12/19/2011 1627     ASSESSMENT & PLAN:  #1 MGUS He has no evidence of disease progression. We will observe. I will like to see him back in 6 months with history, physical examination and blood work  #2 elevated serum creatinine I recommend he has his kidney function test rechecked at the end of the month  to his primary care provider. I recommend he increase oral fluid intake.  #3 history of 12 deficiency He'll continue injection per primary care provider #4 history of prostate cancer He will continue followup with his urologist. #5 elevated total bilirubin Cause is unknown. Recommend observation only.  Orders Placed This Encounter  Procedures  . Comprehensive metabolic panel    Standing Status: Future     Number of Occurrences:      Standing Expiration Date: 08/21/2014  . CBC with Differential    Standing Status: Future     Number of Occurrences:      Standing  Expiration Date: 08/21/2014  . SPEP & IFE with QIG    Standing Status: Future     Number of Occurrences:      Standing Expiration Date: 08/21/2014  . Kappa/lambda light chains    Standing Status: Future     Number of Occurrences:      Standing Expiration Date: 08/21/2014  . Beta 2 microglobulin, serum    Standing Status: Future     Number of Occurrences:      Standing Expiration Date: 08/22/2014   All questions were answered. The patient knows to call the clinic with any problems, questions or concerns. No barriers to learning was detected. I spent 15 minutes counseling the patient face to face. The total time spent in the appointment was 20 minutes and more than 50% was on counseling and review of test results     Heath Lark, MD 08/21/2013 9:10 AM

## 2013-08-22 ENCOUNTER — Telehealth: Payer: Self-pay | Admitting: Hematology and Oncology

## 2013-08-22 NOTE — Telephone Encounter (Signed)
s.w. pt and advised on NOV appt....pt ok and aware °

## 2013-09-03 ENCOUNTER — Ambulatory Visit (INDEPENDENT_AMBULATORY_CARE_PROVIDER_SITE_OTHER): Payer: Medicare Other | Admitting: *Deleted

## 2013-09-03 DIAGNOSIS — E538 Deficiency of other specified B group vitamins: Secondary | ICD-10-CM

## 2013-09-03 MED ORDER — CYANOCOBALAMIN 1000 MCG/ML IJ SOLN
1000.0000 ug | Freq: Once | INTRAMUSCULAR | Status: AC
  Administered 2013-09-03: 1000 ug via INTRAMUSCULAR

## 2013-09-17 ENCOUNTER — Ambulatory Visit: Payer: Medicare Other | Admitting: Family Medicine

## 2013-09-17 DIAGNOSIS — Z0289 Encounter for other administrative examinations: Secondary | ICD-10-CM

## 2013-09-30 ENCOUNTER — Telehealth: Payer: Self-pay | Admitting: *Deleted

## 2013-09-30 NOTE — Telephone Encounter (Signed)
To my knowledged, b12 shots should not cause extremity weakness.  Consider OV

## 2013-09-30 NOTE — Telephone Encounter (Signed)
Left msg on triage stating since starting taking B12 injection he has notice some weakness in his arm. Was getting in (L) arm so they switch to his (R) arm now developing weakness in that arm. Wanting to know does B12 cause weakness if so what else can he take...Nathan Wallace

## 2013-10-01 NOTE — Telephone Encounter (Signed)
Called pt no answer LMOM with md response.../lmb 

## 2013-10-03 ENCOUNTER — Ambulatory Visit: Payer: Medicare Other | Admitting: Internal Medicine

## 2013-10-04 ENCOUNTER — Ambulatory Visit (INDEPENDENT_AMBULATORY_CARE_PROVIDER_SITE_OTHER): Payer: Medicare Other | Admitting: Internal Medicine

## 2013-10-04 ENCOUNTER — Ambulatory Visit: Payer: Medicare Other

## 2013-10-04 ENCOUNTER — Encounter: Payer: Self-pay | Admitting: Internal Medicine

## 2013-10-04 VITALS — BP 110/80 | HR 81 | Temp 98.1°F | Wt 163.0 lb

## 2013-10-04 DIAGNOSIS — I10 Essential (primary) hypertension: Secondary | ICD-10-CM

## 2013-10-04 DIAGNOSIS — I619 Nontraumatic intracerebral hemorrhage, unspecified: Secondary | ICD-10-CM

## 2013-10-04 DIAGNOSIS — M5412 Radiculopathy, cervical region: Secondary | ICD-10-CM

## 2013-10-04 DIAGNOSIS — C61 Malignant neoplasm of prostate: Secondary | ICD-10-CM

## 2013-10-04 DIAGNOSIS — I618 Other nontraumatic intracerebral hemorrhage: Secondary | ICD-10-CM

## 2013-10-04 DIAGNOSIS — E538 Deficiency of other specified B group vitamins: Secondary | ICD-10-CM

## 2013-10-04 DIAGNOSIS — M5010 Cervical disc disorder with radiculopathy, unspecified cervical region: Secondary | ICD-10-CM

## 2013-10-04 MED ORDER — CYANOCOBALAMIN 1000 MCG/ML IJ SOLN
1000.0000 ug | Freq: Once | INTRAMUSCULAR | Status: AC
  Administered 2013-10-04: 1000 ug via INTRAMUSCULAR

## 2013-10-04 MED ORDER — GABAPENTIN 300 MG PO CAPS: 300.0000 mg | ORAL_CAPSULE | Freq: Every evening | ORAL | Status: DC | PRN

## 2013-10-04 MED ORDER — GABAPENTIN 100 MG PO CAPS: 100.0000 mg | ORAL_CAPSULE | Freq: Two times a day (BID) | ORAL | Status: DC

## 2013-10-04 NOTE — Patient Instructions (Addendum)
Please continue all other medications as before, and refills have been done if requested.  Please have the pharmacy call with any other refills you may need.  Please continue your efforts at being more active, low cholesterol diet, and weight control.  You are otherwise up to date with prevention measures today.  Please keep your appointments with your specialists as you may have planned  Your gabapentin was given in hardcopy today  Please return in 6 months, or sooner if needed

## 2013-10-04 NOTE — Progress Notes (Signed)
Subjective:    Patient ID: Nathan Wallace, male    DOB: [birth year], 63 y.o.   MRN: [id number]  HPI Here to f/u; overall doing ok,  Pt denies chest pain, increased sob or doe, wheezing, orthopnea, PND, increased LE swelling, palpitations, dizziness or syncope.  Pt denies polydipsia, polyuria, or low sugar symptoms such as weakness or confusion improved with po intake.  Pt denies new neurological symptoms such as new headache, or facial or extremity weakness or numbness.   Pt states overall good compliance with meds, has been trying to follow lower cholesterol diet, with wt overall stable,  but little exercise however. Has been getting B12 shots to left arm, wondering if something to do with less strength. Sees Dr Alinda Money for yearly psa Past Medical History  Diagnosis Date  . Prostate cancer 2011  . Hyperlipidemia     border line  . GERD (gastroesophageal reflux disease)     NO RECENT PROBLEMS AND NO MEDS  . Arthritis     RT KNEE AND RT SHOULDER  . Impaired glucose tolerance 06/22/2011  . Degenerative joint disease of knee, right   . Benign head tremor   . Left inguinal hernia   . CVA (cerebrovascular accident due to intracerebral hemorrhage) 08/03/2011  . MGUS (monoclonal gammopathy of unknown significance)   . Syncope 2012    after prostate surgery; neg neurology work up.   . Depression 07/26/2012   Past Surgical History  Procedure Laterality Date  . Spine surgery  2003 OR 2004    CERVICAL FUSION-STATES LIMITED ROM NECK  . Wrist surgery    . Inguinal hernia repair  06/01/2011    Procedure: HERNIA REPAIR INGUINAL ADULT;  Surgeon: Adin Hector, MD;  Location: [facility];  Service: General;  Laterality: Left;  Left Inguinal Hernia Repair with Mesh  . Robot assisted laparoscopic radical prostatectomy  jan 2012    da vinci    reports that he quit smoking about 47 years ago. He has never used smokeless tobacco. He reports that he drinks about 3 ounces of alcohol per week. He reports that he  does not use illicit drugs. family history includes Aneurysm in his mother; Cancer in his father; Heart attack in his brother; Hypertension in his mother. No Known Allergies Current Outpatient Prescriptions on File Prior to Visit  Medication Sig Dispense Refill  . allopurinol (ZYLOPRIM) 100 MG tablet Take 1 tablet (100 mg total) by mouth daily.  90 tablet  3  . amLODipine (NORVASC) 5 MG tablet TAKE 1 TABLET (5 MG TOTAL) BY MOUTH DAILY.  90 tablet  2  . aspirin 81 MG chewable tablet Chew 81 mg by mouth daily.      Marland Kitchen atorvastatin (LIPITOR) 40 MG tablet TAKE 1 TABLET (40 MG TOTAL) BY MOUTH DAILY.  90 tablet  3  . cyclobenzaprine (FLEXERIL) 5 MG tablet Take 1 tablet (5 mg total) by mouth 2 (two) times daily as needed for muscle spasms.  180 tablet  1  . irbesartan (AVAPRO) 300 MG tablet Take 1 tablet (300 mg total) by mouth at bedtime.  90 tablet  3  . traMADol (ULTRAM) 50 MG tablet Take 1 tablet (50 mg total) by mouth 2 (two) times daily.  60 tablet  2   No current facility-administered medications on file prior to visit.   Review of Systems  Constitutional: Negative for unusual diaphoresis or other sweats  HENT: Negative for ringing in ear Eyes: Negative for double vision or worsening visual disturbance.  Respiratory: Negative for choking and stridor.   Gastrointestinal: Negative for vomiting or other signifcant bowel change Genitourinary: Negative for hematuria or decreased urine volume.  Musculoskeletal: Negative for other MSK pain or swelling Skin: Negative for color change and worsening wound.  Neurological: Negative for tremors and numbness other than noted  Psychiatric/Behavioral: Negative for decreased concentration or agitation other than above       Objective:   Physical Exam BP 110/80  Pulse 81  Temp(Src) 98.1 F (36.7 C) (Oral)  Wt 163 lb (73.936 kg)  SpO2 96% VS noted,  Constitutional: Pt appears well-developed, well-nourished.  HENT: Head: NCAT.  Right Ear: External  ear normal.  Left Ear: External ear normal.  Eyes: . Pupils are equal, round, and reactive to light. Conjunctivae and EOM are normal Neck: Normal range of motion. Neck supple.  Cardiovascular: Normal rate and regular rhythm.   Pulmonary/Chest: Effort normal and breath sounds normal.  Abd:  Soft, NT, ND, + BS Neurological: Pt is alert. Not confused , motor grossly intact except known 4+/5 LUE weakness/cervical radiculopathy Skin: Skin is warm. No rash Psychiatric: Pt behavior is normal. No agitation.     Assessment & Plan:

## 2013-10-04 NOTE — Progress Notes (Signed)
Pre visit review using our clinic review tool, if applicable. No additional management support is needed unless otherwise documented below in the visit note. 

## 2013-10-06 DIAGNOSIS — Z8546 Personal history of malignant neoplasm of prostate: Secondary | ICD-10-CM | POA: Insufficient documentation

## 2013-10-06 NOTE — Assessment & Plan Note (Signed)
O/w stable without worsening neck pain,  to f/u any worsening symptoms or concerns

## 2013-10-06 NOTE — Assessment & Plan Note (Signed)
D/w pt, right cva hx most likely realted to the left weakness, doubt b12 shots cause worsening, o/w stable, cont same tx

## 2013-10-06 NOTE — Assessment & Plan Note (Signed)
Has regular f/u with urology

## 2013-10-06 NOTE — Assessment & Plan Note (Signed)
stable overall by history and exam, recent data reviewed with pt, and pt to continue medical treatment as before,  to f/u any worsening symptoms or concerns BP Readings from Last 3 Encounters:  10/04/13 110/80  08/21/13 118/76  08/06/13 126/82

## 2013-10-06 NOTE — Assessment & Plan Note (Signed)
Has regular f/u with urology with psa

## 2013-10-08 ENCOUNTER — Other Ambulatory Visit: Payer: Self-pay

## 2013-10-08 MED ORDER — IRBESARTAN 300 MG PO TABS: 300.0000 mg | ORAL_TABLET | Freq: Every day | ORAL | Status: DC

## 2013-10-31 ENCOUNTER — Other Ambulatory Visit: Payer: Self-pay | Admitting: Family Medicine

## 2013-11-04 ENCOUNTER — Ambulatory Visit (INDEPENDENT_AMBULATORY_CARE_PROVIDER_SITE_OTHER): Payer: Medicare Other

## 2013-11-04 DIAGNOSIS — E538 Deficiency of other specified B group vitamins: Secondary | ICD-10-CM

## 2013-11-04 MED ORDER — CYANOCOBALAMIN 1000 MCG/ML IJ SOLN
1000.0000 ug | Freq: Once | INTRAMUSCULAR | Status: AC
  Administered 2013-11-04: 1000 ug via INTRAMUSCULAR

## 2013-11-04 NOTE — Telephone Encounter (Signed)
Refill done.  

## 2013-11-24 ENCOUNTER — Other Ambulatory Visit: Payer: Self-pay | Admitting: Internal Medicine

## 2013-12-03 ENCOUNTER — Other Ambulatory Visit: Payer: Self-pay | Admitting: Family Medicine

## 2013-12-05 ENCOUNTER — Ambulatory Visit: Payer: Medicare Other

## 2013-12-05 NOTE — Telephone Encounter (Signed)
Refill done.  

## 2013-12-09 ENCOUNTER — Ambulatory Visit (INDEPENDENT_AMBULATORY_CARE_PROVIDER_SITE_OTHER): Payer: Medicare Other

## 2013-12-09 DIAGNOSIS — E538 Deficiency of other specified B group vitamins: Secondary | ICD-10-CM

## 2013-12-09 MED ORDER — CYANOCOBALAMIN 1000 MCG/ML IJ SOLN
1000.0000 ug | Freq: Once | INTRAMUSCULAR | Status: AC
  Administered 2013-12-09: 1000 ug via INTRAMUSCULAR

## 2014-01-06 ENCOUNTER — Ambulatory Visit (INDEPENDENT_AMBULATORY_CARE_PROVIDER_SITE_OTHER): Payer: Medicare Other | Admitting: *Deleted

## 2014-01-06 DIAGNOSIS — E538 Deficiency of other specified B group vitamins: Secondary | ICD-10-CM

## 2014-01-06 MED ORDER — CYANOCOBALAMIN 1000 MCG/ML IJ SOLN
1000.0000 ug | Freq: Once | INTRAMUSCULAR | Status: AC
  Administered 2014-01-06: 1000 ug via INTRAMUSCULAR

## 2014-01-08 ENCOUNTER — Other Ambulatory Visit: Payer: Self-pay

## 2014-01-08 ENCOUNTER — Ambulatory Visit: Payer: Medicare Other

## 2014-01-08 MED ORDER — TRAMADOL HCL 50 MG PO TABS: 50.0000 mg | ORAL_TABLET | Freq: Two times a day (BID) | ORAL | Status: DC

## 2014-01-08 NOTE — Telephone Encounter (Signed)
Faxed hardcopy for Tramadol to [facility]. [place]

## 2014-01-08 NOTE — Telephone Encounter (Signed)
Done hardcopy to [facility]  

## 2014-01-11 ENCOUNTER — Other Ambulatory Visit: Payer: Self-pay | Admitting: Family Medicine

## 2014-01-13 NOTE — Telephone Encounter (Signed)
Refill done.  

## 2014-02-05 ENCOUNTER — Ambulatory Visit (INDEPENDENT_AMBULATORY_CARE_PROVIDER_SITE_OTHER): Payer: Medicare Other

## 2014-02-05 DIAGNOSIS — E538 Deficiency of other specified B group vitamins: Secondary | ICD-10-CM

## 2014-02-05 MED ORDER — CYANOCOBALAMIN 1000 MCG/ML IJ SOLN
1000.0000 ug | Freq: Once | INTRAMUSCULAR | Status: AC
  Administered 2014-02-05: 1000 ug via INTRAMUSCULAR

## 2014-02-10 ENCOUNTER — Other Ambulatory Visit: Payer: Self-pay | Admitting: Internal Medicine

## 2014-02-19 ENCOUNTER — Telehealth: Payer: Self-pay | Admitting: Hematology and Oncology

## 2014-02-19 NOTE — Telephone Encounter (Signed)
pt called to r/s all appts...done...pt ok and aware of new d.t

## 2014-02-20 ENCOUNTER — Other Ambulatory Visit: Payer: Medicare Other

## 2014-02-24 ENCOUNTER — Telehealth: Payer: Self-pay | Admitting: Internal Medicine

## 2014-02-24 DIAGNOSIS — Z0000 Encounter for general adult medical examination without abnormal findings: Secondary | ICD-10-CM

## 2014-02-24 NOTE — Telephone Encounter (Signed)
Pt called in and wanted referral  To GI colonoscopy

## 2014-02-25 NOTE — Telephone Encounter (Signed)
This has been done.

## 2014-02-27 ENCOUNTER — Ambulatory Visit: Payer: Medicare Other | Admitting: Hematology and Oncology

## 2014-02-27 ENCOUNTER — Other Ambulatory Visit: Payer: Medicare Other

## 2014-03-04 ENCOUNTER — Other Ambulatory Visit: Payer: Self-pay | Admitting: Family Medicine

## 2014-03-04 NOTE — Telephone Encounter (Signed)
Refill denied. Pt has not been seen 4.28.15. He needs an appt for future refills.

## 2014-03-10 ENCOUNTER — Ambulatory Visit (INDEPENDENT_AMBULATORY_CARE_PROVIDER_SITE_OTHER): Payer: Medicare Other | Admitting: Geriatric Medicine

## 2014-03-10 ENCOUNTER — Ambulatory Visit (HOSPITAL_BASED_OUTPATIENT_CLINIC_OR_DEPARTMENT_OTHER): Payer: Medicare Other

## 2014-03-10 ENCOUNTER — Encounter: Payer: Self-pay | Admitting: Hematology and Oncology

## 2014-03-10 ENCOUNTER — Telehealth: Payer: Self-pay | Admitting: Hematology and Oncology

## 2014-03-10 ENCOUNTER — Encounter: Payer: Self-pay | Admitting: Internal Medicine

## 2014-03-10 ENCOUNTER — Ambulatory Visit (HOSPITAL_BASED_OUTPATIENT_CLINIC_OR_DEPARTMENT_OTHER): Payer: Medicare Other | Admitting: Hematology and Oncology

## 2014-03-10 VITALS — BP 121/93 | HR 73 | Temp 98.1°F | Resp 18 | Ht 71.0 in | Wt 161.6 lb

## 2014-03-10 DIAGNOSIS — Z0000 Encounter for general adult medical examination without abnormal findings: Secondary | ICD-10-CM

## 2014-03-10 DIAGNOSIS — E538 Deficiency of other specified B group vitamins: Secondary | ICD-10-CM

## 2014-03-10 DIAGNOSIS — C61 Malignant neoplasm of prostate: Secondary | ICD-10-CM

## 2014-03-10 DIAGNOSIS — D472 Monoclonal gammopathy: Secondary | ICD-10-CM

## 2014-03-10 LAB — COMPREHENSIVE METABOLIC PANEL (CC13)
ALBUMIN: 4.4 g/dL (ref 3.5–5.0)
ALT: 27 U/L (ref 0–55)
AST: 25 U/L (ref 5–34)
Alkaline Phosphatase: 67 U/L (ref 40–150)
Anion Gap: 11 mEq/L (ref 3–11)
BUN: 14.1 mg/dL (ref 7.0–26.0)
CO2: 27 mEq/L (ref 22–29)
Calcium: 10 mg/dL (ref 8.4–10.4)
Chloride: 104 mEq/L (ref 98–109)
Creatinine: 1 mg/dL (ref 0.7–1.3)
GLUCOSE: 108 mg/dL (ref 70–140)
POTASSIUM: 3.9 meq/L (ref 3.5–5.1)
SODIUM: 141 meq/L (ref 136–145)
Total Bilirubin: 1.1 mg/dL (ref 0.20–1.20)
Total Protein: 8.2 g/dL (ref 6.4–8.3)

## 2014-03-10 LAB — CBC WITH DIFFERENTIAL/PLATELET
BASO%: 0.2 % (ref 0.0–2.0)
Basophils Absolute: 0 10*3/uL (ref 0.0–0.1)
EOS%: 0.6 % (ref 0.0–7.0)
Eosinophils Absolute: 0.1 10*3/uL (ref 0.0–0.5)
HCT: 41.2 % (ref 38.4–49.9)
HGB: 14.1 g/dL (ref 13.0–17.1)
LYMPH%: 24.1 % (ref 14.0–49.0)
MCH: 33.8 pg — AB (ref 27.2–33.4)
MCHC: 34.2 g/dL (ref 32.0–36.0)
MCV: 98.8 fL — AB (ref 79.3–98.0)
MONO#: 0.6 10*3/uL (ref 0.1–0.9)
MONO%: 6.3 % (ref 0.0–14.0)
NEUT#: 6 10*3/uL (ref 1.5–6.5)
NEUT%: 68.8 % (ref 39.0–75.0)
PLATELETS: 322 10*3/uL (ref 140–400)
RBC: 4.17 10*6/uL — AB (ref 4.20–5.82)
RDW: 12.6 % (ref 11.0–14.6)
WBC: 8.8 10*3/uL (ref 4.0–10.3)
lymph#: 2.1 10*3/uL (ref 0.9–3.3)

## 2014-03-10 MED ORDER — CYANOCOBALAMIN 1000 MCG/ML IJ SOLN
1000.0000 ug | Freq: Once | INTRAMUSCULAR | Status: AC
  Administered 2014-03-10: 1000 ug via INTRAMUSCULAR

## 2014-03-10 NOTE — Telephone Encounter (Signed)
Gave avs & cal for Sept 2016. °

## 2014-03-10 NOTE — Progress Notes (Signed)
[place] OFFICE PROGRESS NOTE  Patient Care Team: Biagio Borg, MD as PCP - General Clearnce Sorrel, MD as Attending Physician (Neurology) Charlie Pitter, MD (Neurosurgery) Heath Lark, MD as Consulting Physician (Hematology and Oncology)  DIAGNOSIS: Resected prostate cancer, monoclonal gammopathy of unknown significance and vitamin B12 deficiency anemia  SUMMARY OF ONCOLOGIC HISTORY: This is a pleasant 63 year old gentleman who was diagnosed with monoclonal gammopathy of unknown significance in 2013. The patient has history of anemia. Subsequent evaluation found that the patient had B12 deficiency. He was placed on B12 replacement therapy. 24-hour urine collection x-ray and blood work did not show evidence of end organ damage and he is being observed. He also has history of prostate cancer status post resection. His follow closely with his urologist.  INTERVAL HISTORY: Please see below for problem oriented charting. He feels well. Denies recent infection. No new bone pain. Denies new urinary difficulties. He follows with urologist closely for history of prostate cancer.  REVIEW OF SYSTEMS:   Constitutional: Denies fevers, chills or abnormal weight loss Eyes: Denies blurriness of vision Ears, nose, mouth, throat, and face: Denies mucositis or sore throat Respiratory: Denies cough, dyspnea or wheezes Cardiovascular: Denies palpitation, chest discomfort or lower extremity swelling Gastrointestinal:  Denies nausea, heartburn or change in bowel habits Skin: Denies abnormal skin rashes Lymphatics: Denies new lymphadenopathy or easy bruising Neurological:Denies numbness, tingling or new weaknesses Behavioral/Psych: Mood is stable, no new changes  All other systems were reviewed with the patient and are negative.  I have reviewed the past medical history, past surgical history, social history and family history with the patient and they are unchanged from previous  note.  ALLERGIES:  has No Known Allergies.  MEDICATIONS:  Current Outpatient Prescriptions  Medication Sig Dispense Refill  . allopurinol (ZYLOPRIM) 100 MG tablet TAKE 1 TABLET (100 MG TOTAL) BY MOUTH DAILY. 90 tablet 3  . amLODipine (NORVASC) 5 MG tablet TAKE 1 TABLET BY MOUTH EVERY DAY 90 tablet 2  . aspirin 81 MG chewable tablet Chew 81 mg by mouth daily.    Marland Kitchen atorvastatin (LIPITOR) 40 MG tablet TAKE 1 TABLET (40 MG TOTAL) BY MOUTH DAILY. 90 tablet 3  . cyclobenzaprine (FLEXERIL) 5 MG tablet TAKE 1 TABLET (5 MG TOTAL) BY MOUTH 2 (TWO) TIMES DAILY AS NEEDED FOR MUSCLE SPASMS. 30 tablet 0  . gabapentin (NEURONTIN) 100 MG capsule Take 1 capsule (100 mg total) by mouth 2 (two) times daily. 180 capsule 3  . gabapentin (NEURONTIN) 300 MG capsule Take 1-2 capsules (300-600 mg total) by mouth at bedtime as needed. For leg cramping. 180 capsule 1  . irbesartan (AVAPRO) 300 MG tablet Take 1 tablet (300 mg total) by mouth at bedtime. 90 tablet 3  . PRESCRIPTION MEDICATION Matrex injection at home every other day    . traMADol (ULTRAM) 50 MG tablet Take 1 tablet (50 mg total) by mouth 2 (two) times daily. 60 tablet 2   No current facility-administered medications for this visit.    PHYSICAL EXAMINATION: ECOG PERFORMANCE STATUS: 1 - Symptomatic but completely ambulatory  Filed Vitals:   03/10/14 1239  BP: 121/93  Pulse: 73  Temp: 98.1 F (36.7 C)  Resp: 18   Filed Weights   03/10/14 1239  Weight: 161 lb 9.6 oz (73.301 kg)    GENERAL:alert, no distress and comfortable SKIN: skin color, texture, turgor are normal, no rashes or significant lesions EYES: normal, Conjunctiva are pink and non-injected, sclera clear OROPHARYNX:no exudate, no erythema  and lips, buccal mucosa, and tongue normal  NECK: supple, thyroid normal size, non-tender, without nodularity LYMPH:  no palpable lymphadenopathy in the cervical, axillary or inguinal LUNGS: clear to auscultation and percussion with normal  breathing effort HEART: regular rate & rhythm and no murmurs and no lower extremity edema ABDOMEN:abdomen soft, non-tender and normal bowel sounds Musculoskeletal:no cyanosis of digits and no clubbing  NEURO: alert & oriented x 3 with fluent speech, no focal motor/sensory deficits  LABORATORY DATA:  I have reviewed the data as listed    Component Value Date/Time   NA 141 03/10/2014 1319   NA 143 02/23/2012 2253   K 3.9 03/10/2014 1319   K 3.1* 02/23/2012 2253   CL 104 06/28/2012 1010   CL 107 02/23/2012 2253   CO2 27 03/10/2014 1319   CO2 29 12/13/2011 1549   GLUCOSE 108 03/10/2014 1319   GLUCOSE 127* 06/28/2012 1010   GLUCOSE 113* 02/23/2012 2253   BUN 14.1 03/10/2014 1319   BUN 10 02/23/2012 2253   CREATININE 1.0 03/10/2014 1319   CREATININE 1.40* 02/23/2012 2253   CALCIUM 10.0 03/10/2014 1319   CALCIUM 9.4 12/13/2011 1549   PROT 8.2 03/10/2014 1319   PROT 7.7 12/19/2011 1627   ALBUMIN 4.4 03/10/2014 1319   ALBUMIN 4.5 12/19/2011 1627   AST 25 03/10/2014 1319   AST 35 12/19/2011 1627   ALT 27 03/10/2014 1319   ALT 48 12/19/2011 1627   ALKPHOS 67 03/10/2014 1319   ALKPHOS 56 12/19/2011 1627   BILITOT 1.10 03/10/2014 1319   BILITOT 1.4* 12/19/2011 1627   GFRNONAA 76* 11/03/2011 1645   GFRAA 88* 11/03/2011 1645    No results found for: SPEP, UPEP  Lab Results  Component Value Date   WBC 8.8 03/10/2014   NEUTROABS 6.0 03/10/2014   HGB 14.1 03/10/2014   HCT 41.2 03/10/2014   MCV 98.8* 03/10/2014   PLT 322 03/10/2014      Chemistry      Component Value Date/Time   NA 141 03/10/2014 1319   NA 143 02/23/2012 2253   K 3.9 03/10/2014 1319   K 3.1* 02/23/2012 2253   CL 104 06/28/2012 1010   CL 107 02/23/2012 2253   CO2 27 03/10/2014 1319   CO2 29 12/13/2011 1549   BUN 14.1 03/10/2014 1319   BUN 10 02/23/2012 2253   CREATININE 1.0 03/10/2014 1319   CREATININE 1.40* 02/23/2012 2253      Component Value Date/Time   CALCIUM 10.0 03/10/2014 1319   CALCIUM  9.4 12/13/2011 1549   ALKPHOS 67 03/10/2014 1319   ALKPHOS 56 12/19/2011 1627   AST 25 03/10/2014 1319   AST 35 12/19/2011 1627   ALT 27 03/10/2014 1319   ALT 48 12/19/2011 1627   BILITOT 1.10 03/10/2014 1319   BILITOT 1.4* 12/19/2011 1627      ASSESSMENT & PLAN:  MGUS (monoclonal gammopathy of unknown significance) Clinically, he has no signs of disease progression. I will order additional workup today. Plan to see him next year in 9 months with repeat blood work, examination and skeletal survey.  Prostate cancer He continues close follow-up with urologist. Clinically, he has no signs and symptoms of recurrence of prostate cancer.   No orders of the defined types were placed in this encounter.   All questions were answered. The patient knows to call the clinic with any problems, questions or concerns. No barriers to learning was detected. I spent 20 minutes counseling the patient face to face. The total  time spent in the appointment was 30 minutes and more than 50% was on counseling and review of test results     [facility], [place], MD 03/10/2014 8:05 PM

## 2014-03-10 NOTE — Assessment & Plan Note (Signed)
He continues close follow-up with urologist. Clinically, he has no signs and symptoms of recurrence of prostate cancer.

## 2014-03-10 NOTE — Assessment & Plan Note (Signed)
Clinically, he has no signs of disease progression. I will order additional workup today. Plan to see him next year in 9 months with repeat blood work, examination and skeletal survey.

## 2014-03-12 LAB — SPEP & IFE WITH QIG
ALBUMIN ELP: 56.4 % (ref 55.8–66.1)
ALPHA-1-GLOBULIN: 4.4 % (ref 2.9–4.9)
ALPHA-2-GLOBULIN: 8.2 % (ref 7.1–11.8)
BETA 2: 5.8 % (ref 3.2–6.5)
BETA GLOBULIN: 6.6 % (ref 4.7–7.2)
Gamma Globulin: 18.6 % (ref 11.1–18.8)
IgA: 286 mg/dL (ref 68–379)
IgG (Immunoglobin G), Serum: 1530 mg/dL (ref 650–1600)
IgM, Serum: 27 mg/dL — ABNORMAL LOW (ref 41–251)
M-Spike, %: 0.8 g/dL
Total Protein, Serum Electrophoresis: 7.6 g/dL (ref 6.0–8.3)

## 2014-03-12 LAB — KAPPA/LAMBDA LIGHT CHAINS
KAPPA LAMBDA RATIO: 5 — AB (ref 0.26–1.65)
Kappa free light chain: 3.05 mg/dL — ABNORMAL HIGH (ref 0.33–1.94)
Lambda Free Lght Chn: 0.61 mg/dL (ref 0.57–2.63)

## 2014-03-12 LAB — BETA 2 MICROGLOBULIN, SERUM: BETA 2 MICROGLOBULIN: 2.01 mg/L (ref <=2.51)

## 2014-04-09 ENCOUNTER — Ambulatory Visit (INDEPENDENT_AMBULATORY_CARE_PROVIDER_SITE_OTHER): Payer: Medicare Other | Admitting: *Deleted

## 2014-04-09 DIAGNOSIS — Z23 Encounter for immunization: Secondary | ICD-10-CM

## 2014-04-09 DIAGNOSIS — E538 Deficiency of other specified B group vitamins: Secondary | ICD-10-CM

## 2014-04-09 MED ORDER — CYANOCOBALAMIN 1000 MCG/ML IJ SOLN
1000.0000 ug | Freq: Once | INTRAMUSCULAR | Status: AC
  Administered 2014-04-09: 1000 ug via INTRAMUSCULAR

## 2014-04-30 ENCOUNTER — Other Ambulatory Visit: Payer: Self-pay | Admitting: Internal Medicine

## 2014-05-09 ENCOUNTER — Ambulatory Visit: Payer: Medicare Other

## 2014-05-14 ENCOUNTER — Other Ambulatory Visit (INDEPENDENT_AMBULATORY_CARE_PROVIDER_SITE_OTHER): Payer: Medicare Other

## 2014-05-14 ENCOUNTER — Ambulatory Visit: Payer: Self-pay

## 2014-05-14 ENCOUNTER — Encounter: Payer: Self-pay | Admitting: Internal Medicine

## 2014-05-14 ENCOUNTER — Ambulatory Visit (INDEPENDENT_AMBULATORY_CARE_PROVIDER_SITE_OTHER): Payer: Medicare Other | Admitting: Internal Medicine

## 2014-05-14 VITALS — BP 102/62 | HR 74 | Temp 98.9°F | Ht 71.0 in | Wt 158.2 lb

## 2014-05-14 DIAGNOSIS — I10 Essential (primary) hypertension: Secondary | ICD-10-CM | POA: Diagnosis not present

## 2014-05-14 DIAGNOSIS — C61 Malignant neoplasm of prostate: Secondary | ICD-10-CM

## 2014-05-14 DIAGNOSIS — Z0000 Encounter for general adult medical examination without abnormal findings: Secondary | ICD-10-CM

## 2014-05-14 DIAGNOSIS — E785 Hyperlipidemia, unspecified: Secondary | ICD-10-CM | POA: Diagnosis not present

## 2014-05-14 DIAGNOSIS — R7302 Impaired glucose tolerance (oral): Secondary | ICD-10-CM

## 2014-05-14 DIAGNOSIS — E538 Deficiency of other specified B group vitamins: Secondary | ICD-10-CM

## 2014-05-14 LAB — BASIC METABOLIC PANEL
BUN: 17 mg/dL (ref 6–23)
CHLORIDE: 105 meq/L (ref 96–112)
CO2: 28 meq/L (ref 19–32)
Calcium: 9.8 mg/dL (ref 8.4–10.5)
Creatinine, Ser: 1.03 mg/dL (ref 0.40–1.50)
GFR: 93.38 mL/min (ref >60.00)
GLUCOSE: 100 mg/dL — AB (ref 70–99)
Potassium: 4.2 mEq/L (ref 3.5–5.1)
Sodium: 138 mEq/L (ref 135–145)

## 2014-05-14 LAB — URINALYSIS, ROUTINE W REFLEX MICROSCOPIC
BILIRUBIN URINE: NEGATIVE
HGB URINE DIPSTICK: NEGATIVE
Ketones, ur: NEGATIVE
Leukocytes, UA: NEGATIVE
Nitrite: NEGATIVE
Specific Gravity, Urine: >=1.03 — AB (ref 1.000–1.030)
Total Protein, Urine: NEGATIVE
URINE GLUCOSE: NEGATIVE
Urobilinogen, UA: 0.2 (ref 0.0–1.0)
pH: 6 (ref 5.0–8.0)

## 2014-05-14 LAB — LIPID PANEL
CHOLESTEROL: 139 mg/dL (ref 0–200)
HDL: 59.5 mg/dL (ref >39.00)
LDL Cholesterol: 64 mg/dL (ref 0–99)
NonHDL: 79.5
TRIGLYCERIDES: 79 mg/dL (ref 0.0–149.0)
Total CHOL/HDL Ratio: 2
VLDL: 15.8 mg/dL (ref 0.0–40.0)

## 2014-05-14 LAB — CBC WITH DIFFERENTIAL/PLATELET
BASOS ABS: 0 10*3/uL (ref 0.0–0.1)
Basophils Relative: 0.4 % (ref 0.0–3.0)
Eosinophils Absolute: 0.2 10*3/uL (ref 0.0–0.7)
Eosinophils Relative: 2.4 % (ref 0.0–5.0)
HEMATOCRIT: 39.3 % (ref 39.0–52.0)
HEMOGLOBIN: 13.4 g/dL (ref 13.0–17.0)
LYMPHS ABS: 2.4 10*3/uL (ref 0.7–4.0)
Lymphocytes Relative: 25.6 % (ref 12.0–46.0)
MCHC: 34 g/dL (ref 30.0–36.0)
MCV: 97.8 fl (ref 78.0–100.0)
MONO ABS: 0.8 10*3/uL (ref 0.1–1.0)
Monocytes Relative: 8.6 % (ref 3.0–12.0)
NEUTROS PCT: 63 % (ref 43.0–77.0)
Neutro Abs: 5.8 10*3/uL (ref 1.4–7.7)
Platelets: 270 10*3/uL (ref 150.0–400.0)
RBC: 4.02 Mil/uL — ABNORMAL LOW (ref 4.22–5.81)
RDW: 13.8 % (ref 11.5–15.5)
WBC: 9.2 10*3/uL (ref 4.0–10.5)

## 2014-05-14 LAB — HEPATIC FUNCTION PANEL
ALT: 27 U/L (ref 0–53)
AST: 27 U/L (ref 0–37)
Albumin: 4.6 g/dL (ref 3.5–5.2)
Alkaline Phosphatase: 65 U/L (ref 39–117)
BILIRUBIN DIRECT: 0.2 mg/dL (ref 0.0–0.3)
BILIRUBIN TOTAL: 0.9 mg/dL (ref 0.2–1.2)
Total Protein: 7.3 g/dL (ref 6.0–8.3)

## 2014-05-14 LAB — PSA: PSA: 0.09 ng/mL — ABNORMAL LOW (ref 0.10–4.00)

## 2014-05-14 LAB — TSH: TSH: 1.77 u[IU]/mL (ref 0.35–4.50)

## 2014-05-14 LAB — HEMOGLOBIN A1C: HEMOGLOBIN A1C: 6.2 % (ref 4.6–6.5)

## 2014-05-14 MED ORDER — CYCLOBENZAPRINE HCL 5 MG PO TABS: ORAL_TABLET | ORAL | Status: DC

## 2014-05-14 MED ORDER — CYANOCOBALAMIN 1000 MCG/ML IJ SOLN
1000.0000 ug | Freq: Once | INTRAMUSCULAR | Status: AC
  Administered 2014-05-14: 1000 ug via INTRAMUSCULAR

## 2014-05-14 NOTE — Progress Notes (Signed)
Subjective:    Patient ID: Nathan Wallace, male    DOB: January 29, 1951, 64 y.o.   MRN: [id number]  HPI  Here for wellness and f/u;  Overall doing ok;  Pt denies CP, worsening SOB, DOE, wheezing, orthopnea, PND, worsening LE edema, palpitations, dizziness or syncope.  Pt denies neurological change such as new headache, facial or extremity weakness.  Pt denies polydipsia, polyuria, or low sugar symptoms. Pt states overall good compliance with treatment and medications, good tolerability, and has been trying to follow lower cholesterol diet.  Pt denies worsening depressive symptoms, suicidal ideation or panic. No fever, night sweats, wt loss, loss of appetite, or other constitutional symptoms.  Pt states good ability with ADL's, has low fall risk, home safety reviewed and adequate, no other significant changes in hearing or vision, and only occasionally active with exercise.For  b12 shot today.  Followed closetly per oncology for MGUS and urology with prostate ca.  No current complaints.  Needs flexeril refill. Cant recall his last psa but was low Past Medical History  Diagnosis Date  . Prostate cancer 2011  . Hyperlipidemia     border line  . GERD (gastroesophageal reflux disease)     NO RECENT PROBLEMS AND NO MEDS  . Arthritis     RT KNEE AND RT SHOULDER  . Impaired glucose tolerance 06/22/2011  . Degenerative joint disease of knee, right   . Benign head tremor   . Left inguinal hernia   . CVA (cerebrovascular accident due to intracerebral hemorrhage) 08/03/2011  . MGUS (monoclonal gammopathy of unknown significance)   . Syncope 2012    after prostate surgery; neg neurology work up.   . Depression 07/26/2012   Past Surgical History  Procedure Laterality Date  . Spine surgery  2003 OR 2004    CERVICAL FUSION-STATES LIMITED ROM NECK  . Wrist surgery    . Inguinal hernia repair  06/01/2011    Procedure: HERNIA REPAIR INGUINAL ADULT;  Surgeon: Adin Hector, MD;  Location: [facility];  Service:  General;  Laterality: Left;  Left Inguinal Hernia Repair with Mesh  . Robot assisted laparoscopic radical prostatectomy  jan 2012    da vinci    reports that he quit smoking about 47 years ago. He has never used smokeless tobacco. He reports that he drinks about 3.0 oz of alcohol per week. He reports that he does not use illicit drugs. family history includes Aneurysm in his mother; Cancer in his father; Heart attack in his brother; Hypertension in his mother. No Known Allergies Current Outpatient Prescriptions on File Prior to Visit  Medication Sig Dispense Refill  . allopurinol (ZYLOPRIM) 100 MG tablet TAKE 1 TABLET (100 MG TOTAL) BY MOUTH DAILY. 90 tablet 3  . amLODipine (NORVASC) 5 MG tablet TAKE 1 TABLET BY MOUTH EVERY DAY 90 tablet 2  . aspirin 81 MG chewable tablet Chew 81 mg by mouth daily.    Marland Kitchen atorvastatin (LIPITOR) 40 MG tablet TAKE 1 TABLET BY MOUTH EVERY DAY 90 tablet 1  . gabapentin (NEURONTIN) 100 MG capsule Take 1 capsule (100 mg total) by mouth 2 (two) times daily. 180 capsule 3  . gabapentin (NEURONTIN) 300 MG capsule Take 1-2 capsules (300-600 mg total) by mouth at bedtime as needed. For leg cramping. 180 capsule 1  . irbesartan (AVAPRO) 300 MG tablet Take 1 tablet (300 mg total) by mouth at bedtime. 90 tablet 3  . PRESCRIPTION MEDICATION Matrex injection at home every other day    .  traMADol (ULTRAM) 50 MG tablet Take 1 tablet (50 mg total) by mouth 2 (two) times daily. 60 tablet 2   No current facility-administered medications on file prior to visit.     Review of Systems Constitutional: Negative for increased diaphoresis, other activity, appetite or other siginficant weight change  HENT: Negative for worsening hearing loss, ear pain, facial swelling, mouth sores and neck stiffness.   Eyes: Negative for other worsening pain, redness or visual disturbance.  Respiratory: Negative for shortness of breath and wheezing.   Cardiovascular: Negative for chest pain and  palpitations.  Gastrointestinal: Negative for diarrhea, blood in stool, abdominal distention or other pain Genitourinary: Negative for hematuria, flank pain or change in urine volume.  Musculoskeletal: Negative for myalgias or other joint complaints.  Skin: Negative for color change and wound.  Neurological: Negative for syncope and numbness. other than noted Hematological: Negative for adenopathy. or other swelling Psychiatric/Behavioral: Negative for hallucinations, self-injury, decreased concentration or other worsening agitation.      Objective:   Physical Exam BP 102/62 mmHg  Pulse 74  Temp(Src) 98.9 F (37.2 C) (Oral)  Ht 5\' 11"  (1.803 m)  Wt 158 lb 4 oz (71.782 kg)  BMI 22.08 kg/m2  SpO2 97% VS noted,  Constitutional: Pt is oriented to person, place, and time. Appears well-developed and well-nourished.  Head: Normocephalic and atraumatic.  Right Ear: External ear normal.  Left Ear: External ear normal.  Nose: Nose normal.  Mouth/Throat: Oropharynx is clear and moist.  Eyes: Conjunctivae and EOM are normal. Pupils are equal, round, and reactive to light.  Neck: Normal range of motion. Neck supple. No JVD present. No tracheal deviation present.  Cardiovascular: Normal rate, regular rhythm, normal heart sounds and intact distal pulses.   Pulmonary/Chest: Effort normal and breath sounds without rales or wheezing  Abdominal: Soft. Bowel sounds are normal. NT. No HSM  Musculoskeletal: Normal range of motion. Exhibits no edema.  Lymphadenopathy:  Has no cervical adenopathy.  Neurological: Pt is alert and oriented to person, place, and time. Pt has normal reflexes. No cranial nerve deficit. Motor grossly intact Skin: Skin is warm and dry. No rash noted.  Psychiatric:  Has normal mood and affect. Behavior is normal.      Assessment & Plan:

## 2014-05-14 NOTE — Progress Notes (Signed)
Pre visit review using our clinic review tool, if applicable. No additional management support is needed unless otherwise documented below in the visit note. 

## 2014-05-14 NOTE — Assessment & Plan Note (Signed)

## 2014-05-14 NOTE — Patient Instructions (Addendum)
Your B12 shot was given today  Please continue all other medications as before, and refills have been done if requested - the flexeril  Please have the pharmacy call with any other refills you may need.  Please continue your efforts at being more active, low cholesterol diet, and weight control.  You are otherwise up to date with prevention measures today.  Please keep your appointments with your specialists as you may have planned  You will be contacted regarding the referral for: colonoscopy  Please go to the LAB in the Basement (turn left off the elevator) for the tests to be done today  You will be contacted by phone if any changes need to be made immediately.  Otherwise, you will receive a letter about your results with an explanation, but please check with MyChart first.  Please remember to sign up for MyChart if you have not done so, as this will be important to you in the future with finding out test results, communicating by private email, and scheduling acute appointments online when needed.  Please return in 1 year for your yearly visit, or sooner if needed

## 2014-05-14 NOTE — Assessment & Plan Note (Signed)
For IM replacement today

## 2014-05-19 ENCOUNTER — Ambulatory Visit (AMBULATORY_SURGERY_CENTER): Payer: Self-pay | Admitting: *Deleted

## 2014-05-19 VITALS — Ht 71.0 in | Wt 166.6 lb

## 2014-05-19 DIAGNOSIS — Z800 Family history of malignant neoplasm of digestive organs: Secondary | ICD-10-CM

## 2014-05-19 MED ORDER — NA SULFATE-K SULFATE-MG SULF 17.5-3.13-1.6 GM/177ML PO SOLN: 1.0000 | Freq: Once | ORAL | Status: DC

## 2014-05-19 NOTE — Progress Notes (Signed)
No diet pills No egg or soy allergy No home 02 use No issues with past sedation but with prostate surgery they told him he was hard to put to sleep

## 2014-06-02 ENCOUNTER — Encounter: Payer: Self-pay | Admitting: Gastroenterology

## 2014-06-02 ENCOUNTER — Ambulatory Visit (AMBULATORY_SURGERY_CENTER): Payer: Medicare Other | Admitting: Gastroenterology

## 2014-06-02 VITALS — BP 113/85 | HR 53 | Temp 95.6°F | Resp 11 | Ht 71.0 in | Wt 158.0 lb

## 2014-06-02 DIAGNOSIS — Z800 Family history of malignant neoplasm of digestive organs: Secondary | ICD-10-CM

## 2014-06-02 DIAGNOSIS — Z1211 Encounter for screening for malignant neoplasm of colon: Secondary | ICD-10-CM

## 2014-06-02 MED ORDER — SODIUM CHLORIDE 0.9 % IV SOLN: 500.0000 mL | INTRAVENOUS | Status: DC

## 2014-06-02 NOTE — Patient Instructions (Signed)
Diverticulosis seen today, handout given. Repeat colonoscopy in 5 years.  Call us with any questions or concerns. Thank you!  YOU HAD AN ENDOSCOPIC PROCEDURE TODAY AT [facility]: Refer to the procedure report that was given to you for any specific questions about what was found during the examination.  If the procedure report does not answer your questions, please call your gastroenterologist to clarify.  If you requested that your care partner not be given the details of your procedure findings, then the procedure report has been included in a sealed envelope for you to review at your convenience later.  YOU SHOULD EXPECT: Some feelings of bloating in the abdomen. Passage of more gas than usual.  Walking can help get rid of the air that was put into your GI tract during the procedure and reduce the bloating. If you had a lower endoscopy (such as a colonoscopy or flexible sigmoidoscopy) you may notice spotting of blood in your stool or on the toilet paper. If you underwent a bowel prep for your procedure, then you may not have a normal bowel movement for a few days.  DIET: Your first meal following the procedure should be a light meal and then it is ok to progress to your normal diet.  A half-sandwich or bowl of soup is an example of a good first meal.  Heavy or fried foods are harder to digest and may make you feel nauseous or bloated.  Likewise meals heavy in dairy and vegetables can cause extra gas to form and this can also increase the bloating.  Drink plenty of fluids but you should avoid alcoholic beverages for 24 hours.  ACTIVITY: Your care partner should take you home directly after the procedure.  You should plan to take it easy, moving slowly for the rest of the day.  You can resume normal activity the day after the procedure however you should NOT DRIVE or use heavy machinery for 24 hours (because of the sedation medicines used during the test).    SYMPTOMS TO REPORT  IMMEDIATELY: A gastroenterologist can be reached at any hour.  During normal business hours, 8:30 AM to 5:00 PM Monday through Friday, call [phone].  After hours and on weekends, please call the GI answering service at [phone] who will take a message and have the physician on call contact you.   Following lower endoscopy (colonoscopy or flexible sigmoidoscopy):  Excessive amounts of blood in the stool  Significant tenderness or worsening of abdominal pains  Swelling of the abdomen that is new, acute  Fever of 100F or higher  Following upper endoscopy (EGD)  Vomiting of blood or coffee ground material  New chest pain or pain under the shoulder blades  Painful or persistently difficult swallowing  New shortness of breath  Fever of 100F or higher  Black, tarry-looking stools  FOLLOW UP: If any biopsies were taken you will be contacted by phone or by letter within the next 1-3 weeks.  Call your gastroenterologist if you have not heard about the biopsies in 3 weeks.  Our staff will call the home number listed on your records the next business day following your procedure to check on you and address any questions or concerns that you may have at that time regarding the information given to you following your procedure. This is a courtesy call and so if there is no answer at the home number and we have not heard from you through the emergency physician on  call, we will assume that you have returned to your regular daily activities without incident.  SIGNATURES/CONFIDENTIALITY: You and/or your care partner have signed paperwork which will be entered into your electronic medical record.  These signatures attest to the fact that that the information above on your After Visit Summary has been reviewed and is understood.  Full responsibility of the confidentiality of this discharge information lies with you and/or your care-partner.

## 2014-06-02 NOTE — Op Note (Signed)
[facility]  [address] [place], [id number]   COLONOSCOPY PROCEDURE REPORT  PATIENT: Ashok, Sawaya  MR#: [id number] BIRTHDATE: 05-Jun-1950 , 66  yrs. old GENDER: male ENDOSCOPIST: Inda Castle, MD REFERRED JK:KXFGH John, M.D. PROCEDURE DATE:  06/02/2014 PROCEDURE:   Colonoscopy, diagnostic First Screening Colonoscopy - Avg.  risk and is 50 yrs.  old or older - No.  Prior Negative Screening - Now for repeat screening. Above average risk  History of Adenoma - Now for follow-up colonoscopy & has been > or = to 3 yrs.  N/A  Polyps Removed Today? No.  Recommend repeat exam, <10 yrs? Yes.  High risk (family or personal hx). ASA CLASS:   Class II INDICATIONS:patient's immediate family history of colon cancer. MEDICATIONS: Monitored anesthesia care and Propofol 200 mg IV  DESCRIPTION OF PROCEDURE:   After the risks benefits and alternatives of the procedure were thoroughly explained, informed consent was obtained.  The digital rectal exam revealed no abnormalities of the rectum.   The [id number]  endoscope was introduced through the anus and advanced to the cecum, which was identified by both the appendix and ileocecal valve. No adverse events experienced.   The quality of the prep was excellent using Suprep  The instrument was then slowly withdrawn as the colon was fully examined.      COLON FINDINGS: There was mild diverticulosis noted in the descending colon.   The examination was otherwise normal. Retroflexed views revealed no abnormalities. The time to cecum=4 minutes 28 seconds.  Withdrawal time=6 minutes 54 seconds.  The scope was withdrawn and the procedure completed. COMPLICATIONS: There were no immediate complications.  ENDOSCOPIC IMPRESSION: 1.   Mild diverticulosis was noted in the descending colon 2.   The examination was otherwise normal  RECOMMENDATIONS: Given your significant family history of colon cancer, you should have a  repeat colonoscopy in 5 years  eSigned:  Inda Castle, MD 06/02/2014 8:58 AM   cc:

## 2014-06-02 NOTE — Progress Notes (Signed)
Procedure ends, to recovery, report given and VSS. 

## 2014-06-03 ENCOUNTER — Telehealth: Payer: Self-pay | Admitting: *Deleted

## 2014-06-03 NOTE — Telephone Encounter (Signed)
No identifier, left message, follow-up  

## 2014-06-05 ENCOUNTER — Other Ambulatory Visit: Payer: Self-pay | Admitting: *Deleted

## 2014-06-05 MED ORDER — AMLODIPINE BESYLATE 5 MG PO TABS: 5.0000 mg | ORAL_TABLET | Freq: Every day | ORAL | Status: DC

## 2014-06-05 MED ORDER — TRAMADOL HCL 50 MG PO TABS: 50.0000 mg | ORAL_TABLET | Freq: Two times a day (BID) | ORAL | Status: DC

## 2014-06-05 MED ORDER — GABAPENTIN 300 MG PO CAPS
300.0000 mg | ORAL_CAPSULE | Freq: Every evening | ORAL | Status: DC | PRN
Start: 2014-06-05 — End: 2014-08-01

## 2014-06-05 MED ORDER — ATORVASTATIN CALCIUM 40 MG PO TABS
40.0000 mg | ORAL_TABLET | Freq: Every day | ORAL | Status: DC
Start: 2014-06-05 — End: 2015-08-15

## 2014-06-05 MED ORDER — ALLOPURINOL 100 MG PO TABS: ORAL_TABLET | ORAL | Status: DC

## 2014-06-05 MED ORDER — IRBESARTAN 300 MG PO TABS: 300.0000 mg | ORAL_TABLET | Freq: Every day | ORAL | Status: DC

## 2014-06-05 NOTE — Telephone Encounter (Signed)
Done hardcopy to Cherina  

## 2014-06-05 NOTE — Telephone Encounter (Signed)
Hardcopy sent to pharmacy

## 2014-06-05 NOTE — Telephone Encounter (Signed)
Received fax pt needing refills on irbesartan, atorvastatin, and tramadol. Sent maintenance meds pls advise on tramadol...Johny Chess

## 2014-06-06 ENCOUNTER — Telehealth: Payer: Self-pay | Admitting: Internal Medicine

## 2014-06-06 DIAGNOSIS — C61 Malignant neoplasm of prostate: Secondary | ICD-10-CM | POA: Diagnosis not present

## 2014-06-06 MED ORDER — CYCLOBENZAPRINE HCL 5 MG PO TABS: ORAL_TABLET | ORAL | Status: DC

## 2014-06-06 NOTE — Telephone Encounter (Signed)
Pt came by requesting a refill on his Flexaril through [employer].  Pt states [facility] sent refill request to the office so his insurance will start covering it.  Please let pt know status of refill request.

## 2014-06-06 NOTE — Telephone Encounter (Signed)
Notified pt sent rx to [facility]...Nathan Wallace

## 2014-06-13 ENCOUNTER — Ambulatory Visit (INDEPENDENT_AMBULATORY_CARE_PROVIDER_SITE_OTHER): Payer: Medicare Other | Admitting: *Deleted

## 2014-06-13 DIAGNOSIS — E538 Deficiency of other specified B group vitamins: Secondary | ICD-10-CM

## 2014-06-13 DIAGNOSIS — C61 Malignant neoplasm of prostate: Secondary | ICD-10-CM | POA: Diagnosis not present

## 2014-06-13 DIAGNOSIS — N393 Stress incontinence (female) (male): Secondary | ICD-10-CM | POA: Diagnosis not present

## 2014-06-13 DIAGNOSIS — N5201 Erectile dysfunction due to arterial insufficiency: Secondary | ICD-10-CM | POA: Diagnosis not present

## 2014-06-13 MED ORDER — CYANOCOBALAMIN 1000 MCG/ML IJ SOLN
1000.0000 ug | Freq: Once | INTRAMUSCULAR | Status: AC
  Administered 2014-06-13: 1000 ug via INTRAMUSCULAR

## 2014-06-17 DIAGNOSIS — R278 Other lack of coordination: Secondary | ICD-10-CM | POA: Diagnosis not present

## 2014-06-17 DIAGNOSIS — N393 Stress incontinence (female) (male): Secondary | ICD-10-CM | POA: Diagnosis not present

## 2014-06-17 DIAGNOSIS — C61 Malignant neoplasm of prostate: Secondary | ICD-10-CM | POA: Diagnosis not present

## 2014-06-17 DIAGNOSIS — M6281 Muscle weakness (generalized): Secondary | ICD-10-CM | POA: Diagnosis not present

## 2014-06-24 DIAGNOSIS — M6281 Muscle weakness (generalized): Secondary | ICD-10-CM | POA: Diagnosis not present

## 2014-06-24 DIAGNOSIS — C61 Malignant neoplasm of prostate: Secondary | ICD-10-CM | POA: Diagnosis not present

## 2014-06-24 DIAGNOSIS — N393 Stress incontinence (female) (male): Secondary | ICD-10-CM | POA: Diagnosis not present

## 2014-06-24 DIAGNOSIS — R278 Other lack of coordination: Secondary | ICD-10-CM | POA: Diagnosis not present

## 2014-07-15 ENCOUNTER — Ambulatory Visit (INDEPENDENT_AMBULATORY_CARE_PROVIDER_SITE_OTHER): Payer: Medicare Other | Admitting: *Deleted

## 2014-07-15 DIAGNOSIS — E538 Deficiency of other specified B group vitamins: Secondary | ICD-10-CM | POA: Diagnosis not present

## 2014-07-15 MED ORDER — CYANOCOBALAMIN 1000 MCG/ML IJ SOLN
1000.0000 ug | Freq: Once | INTRAMUSCULAR | Status: AC
  Administered 2014-07-15: 1000 ug via INTRAMUSCULAR

## 2014-07-28 ENCOUNTER — Telehealth: Payer: Self-pay | Admitting: Internal Medicine

## 2014-07-28 NOTE — Telephone Encounter (Signed)
Patient has new insurance [employer]. They will not cover cyclobenzaprine (FLEXERIL) 5 MG tablet [[id number], but will cover tizanidine but only 90 day supply. He has been out of that for a while. He also needs traMADol (ULTRAM) 50 MG tablet [[id number] written as a 90 day supply instead of 30. Pharmacy is [facility].

## 2014-07-28 NOTE — Telephone Encounter (Signed)
MD out of office will hold until he returns tomorrow...Nathan Wallace

## 2014-07-29 MED ORDER — TRAMADOL HCL 50 MG PO TABS: 50.0000 mg | ORAL_TABLET | Freq: Two times a day (BID) | ORAL | Status: DC | PRN

## 2014-07-29 MED ORDER — TIZANIDINE HCL 4 MG PO TABS: 4.0000 mg | ORAL_TABLET | Freq: Three times a day (TID) | ORAL | Status: DC | PRN

## 2014-07-29 NOTE — Telephone Encounter (Signed)
Rx sent to pharmacy   

## 2014-07-29 NOTE — Telephone Encounter (Addendum)
ultram Done hardcopy to Cherina  Tizanidine sent erx

## 2014-07-30 ENCOUNTER — Other Ambulatory Visit: Payer: Self-pay

## 2014-08-01 ENCOUNTER — Telehealth: Payer: Self-pay | Admitting: Internal Medicine

## 2014-08-01 MED ORDER — GABAPENTIN 300 MG PO CAPS: 300.0000 mg | ORAL_CAPSULE | Freq: Every evening | ORAL | Status: DC | PRN

## 2014-08-01 NOTE — Telephone Encounter (Signed)
Pt state that he also needs gabapentin (NEURONTIN) 100 MG capsule [[id number]] sent to mail oder as well for a 90 day supply

## 2014-08-01 NOTE — Telephone Encounter (Signed)
Called pt no answer LMOM rx sent [facility]...Johny Chess

## 2014-08-11 ENCOUNTER — Other Ambulatory Visit: Payer: Self-pay | Admitting: *Deleted

## 2014-08-11 MED ORDER — GABAPENTIN 300 MG PO CAPS: 300.0000 mg | ORAL_CAPSULE | Freq: Every evening | ORAL | Status: DC | PRN

## 2014-08-14 ENCOUNTER — Ambulatory Visit (INDEPENDENT_AMBULATORY_CARE_PROVIDER_SITE_OTHER): Payer: Medicare Other | Admitting: *Deleted

## 2014-08-14 ENCOUNTER — Ambulatory Visit: Payer: Medicare Other | Admitting: Internal Medicine

## 2014-08-14 ENCOUNTER — Other Ambulatory Visit: Payer: Self-pay | Admitting: *Deleted

## 2014-08-14 DIAGNOSIS — E538 Deficiency of other specified B group vitamins: Secondary | ICD-10-CM

## 2014-08-14 MED ORDER — CYANOCOBALAMIN 1000 MCG/ML IJ SOLN
1000.0000 ug | Freq: Once | INTRAMUSCULAR | Status: AC
  Administered 2014-08-14: 1000 ug via INTRAMUSCULAR

## 2014-08-14 MED ORDER — GABAPENTIN 100 MG PO CAPS: 100.0000 mg | ORAL_CAPSULE | Freq: Two times a day (BID) | ORAL | Status: DC

## 2014-08-14 NOTE — Telephone Encounter (Signed)
Pt came in for huis B12 inj he stated that [facility] has sent request to get his gabapentin 100mg  filled. Haven't heard back. Inform pt we only received request for the 300 mg will send refill for 100 mg...Nathan Wallace

## 2014-09-15 ENCOUNTER — Ambulatory Visit (INDEPENDENT_AMBULATORY_CARE_PROVIDER_SITE_OTHER): Payer: Medicare Other | Admitting: Family Medicine

## 2014-09-15 ENCOUNTER — Encounter: Payer: Self-pay | Admitting: Family Medicine

## 2014-09-15 VITALS — BP 130/72 | HR 63 | Ht 71.0 in | Wt 160.0 lb

## 2014-09-15 DIAGNOSIS — M5010 Cervical disc disorder with radiculopathy, unspecified cervical region: Secondary | ICD-10-CM

## 2014-09-15 DIAGNOSIS — E538 Deficiency of other specified B group vitamins: Secondary | ICD-10-CM

## 2014-09-15 MED ORDER — ALLOPURINOL 100 MG PO TABS: ORAL_TABLET | ORAL | Status: DC

## 2014-09-15 MED ORDER — CYANOCOBALAMIN 1000 MCG/ML IJ SOLN
1000.0000 ug | Freq: Once | INTRAMUSCULAR | Status: AC
  Administered 2014-09-15: 1000 ug via INTRAMUSCULAR

## 2014-09-15 NOTE — Assessment & Plan Note (Signed)
Patient does have more of the radiculopathy symptoms and I do think that is secondary to more of his fusion. Seems more on the bilateral shoulders as well as, some mild weakness. We discussed different treatment options including the possibility of formal physical therapy and epidurals which patient declined. We decided to change his allopurinol to 200 mg see if possibly some of his uric acid could be contributing and patient will monitor his statin use. Patient continues to have difficulty I would like to increase his gabapentin to 300 mg 3 times a day. If continuing to have discomfort we may need to consider repeat imaging.  Spent  25 minutes with patient face-to-face and had greater than 50% of counseling including as described above in assessment and plan.

## 2014-09-15 NOTE — Progress Notes (Signed)
Pre visit review using our clinic review tool, if applicable. No additional management support is needed unless otherwise documented below in the visit note. 

## 2014-09-15 NOTE — Progress Notes (Signed)
  [name] [address] [place],  [address] Phone: [phone] Subjective:    I'm seeing this patient by the request  of:  Cathlean Cower, MD   CC: left shoulder pain and neck pain  [id number] Nathan Wallace is a 64 y.o. male coming in with complaint of left shoulder pain and neck pain follow up. Of note patient does have a past medical history significant for C4 through C7 fusion. She was started on gabapentin at last visit and titrated up to 200 mg the morning, 200 mg in the afternoon 300 mg at night. Patient is been doing very well overall. Patient is not taking the gabapentin on a regular basis as much as it was previously. Patient states though that is not making a mini tired. Patient was given a new muscle relaxer though that unfortunate does make him very tired. Patient states that this is starting to affect his other daily activities on a more regular basis.patient states that if he tries to do any overhead activity he becomes very fatigued quickly.she states it can be very uncomfortable at night as well. States that as long as he keeps his arm below his head he does not have any difficulty.       Past medical history, social, surgical and family history all reviewed in electronic medical record.   Review of Systems: No headache, visual changes, nausea, vomiting, diarrhea, constipation, dizziness, abdominal pain, skin rash, fevers, chills, night sweats, weight loss, swollen lymph nodes, body aches, joint swelling, muscle aches, chest pain, shortness of breath, mood changes.   Objective Blood pressure 130/72, pulse 63, weight 160 lb (72.576 kg), SpO2 98 %.  General: No apparent distress alert and oriented x3 mood and affect normal, dressed appropriately.  HEENT: Pupils equal, extraocular movements intact  Respiratory: Patient's speak in full sentences and does not appear short of breath  Cardiovascular: No lower extremity edema, non tender, no  erythema  Skin: Warm dry intact with no signs of infection or rash on extremities or on axial skeleton.  Abdomen: Soft nontender  Neuro: Cranial nerves II through XII are intact, neurovascularly intact in all extremities with 2+ DTRs and 2+ pulses.  Lymph: No lymphadenopathy of posterior or anterior cervical chain or axillae bilaterally.  Gait normal with good balance and coordination.  MSK:  Non tender with full range of motion and good stability and symmetric strength and tone of  elbows, wrist, hip, knee and ankles bilaterally.  Neck: Inspection shows loss of lordosis No palpable stepoffs. Positive Spurling's maneuver noted. Limited range of motion in flexion-extension as well as side bending secondary to fusion Grip strength and sensation normal in bilateral hands Strength good C4 to T1 distribution, with mild weakness of the T1 distribution compared to the contralateral side No sensory change to C4 to T1 Negative Hoffman sign bilaterally Reflexes normal   Impression and Recommendations:     This case required medical decision making of moderate complexity.

## 2014-09-15 NOTE — Patient Instructions (Addendum)
Good to see you Ice 20 minutes 2 times daily. Usually after activity and before bed. Continue the gabapentin  Increase allopurinol 200mg  daily Try to decrease the atrovostatin to 3 times a week Tylenol 500mg  3 times daily Vitamin D 2000 IU daily  Turmeric 500mg  twice daily Fish oil 2 grams daily pennsaid pinkie amount topically 2 times daily as needed.  Try ibuprofen only when needed See me again 3 weeks.

## 2014-09-23 ENCOUNTER — Telehealth: Payer: Self-pay | Admitting: Internal Medicine

## 2014-09-23 ENCOUNTER — Telehealth: Payer: Self-pay | Admitting: *Deleted

## 2014-09-23 NOTE — Telephone Encounter (Signed)
PLEASE NOTE: All timestamps contained within this report are represented as [place] Standard Time. CONFIDENTIALTY NOTICE: This fax transmission is intended only for the addressee. It contains information that is legally privileged, confidential or otherwise protected from use or disclosure. If you are not the intended recipient, you are strictly prohibited from reviewing, disclosing, copying using or disseminating any of this information or taking any action in reliance on or regarding this information. If you have received this fax in error, please notify us immediately by telephone so that we can arrange for its return to [place]. Phone: [phone], Toll-Free: [phone], Fax: [phone] Page: 1 of 1 Call Id: [id number] [place] Patient Name: Nathan Wallace DOB: 1951/01/31 Initial Comment Caller states he has been coughing up yellow phelm, sore throat. Nurse Assessment Nurse: Markus Daft, RN, Sherre Poot Date/Time (Eastern Time): 09/23/2014 9:18:57 AM Confirm and document reason for call. If symptomatic, describe symptoms. ---Caller states that he had a dry mild cough that started on Sunday and then last night started with sore throat. This AM he is coughing up yellow phlegm. Took Mucinex. - No fever. Has the patient traveled out of the country within the last 30 days? ---Not Applicable Does the patient require triage? ---Yes Related visit to physician within the last 2 weeks? ---No Does the PT have any chronic conditions? (i.e. diabetes, asthma, etc.) ---No Guidelines Guideline Title Affirmed Question Affirmed Notes Cough - Acute Productive Cough with cold symptoms (e.g., runny nose, postnasal drip, throat clearing) (all triage questions negative) --Reports stuffy nose; only feels the sore throat pain with the coughing - not severe. Sore Throat [1] Sore throat with cough/cold symptoms AND [2] present < 5  days (all triage questions negative) Final Disposition [place], RN, Sherre Poot Comments Caller reassured that antibiotics would not be helpful at this time as most likely viral (per cold/cough/sore throat Guideline). Caller verb. understanding.

## 2014-09-23 NOTE — Telephone Encounter (Signed)
disregard

## 2014-09-23 NOTE — Telephone Encounter (Signed)
[facility] - [facility] Patient Name: Nathan Wallace Gender: Male DOB: 1950/10/11 Age: 64 Y 9 M 17 D Return Phone Number: [phone] (Primary) Address: City/State/Zip: [place] Client [place] [facility] - Client Client Site [place] - Day Physician Jones, [place] Type Call Call Type Triage / [place] Name [place] Relationship To Patient Self Appointment Disposition EMR Appointment Not Necessary Info pasted into Epic Yes Return Phone Number [phone] (Primary) Chief Complaint Sore Throat Initial Comment Caller states he has been coughing up yellow phelm, sore throat. PreDisposition Search internet for information Nurse Assessment Nurse: Markus Daft, RN, Sherre Poot Date/Time Eilene Ghazi Time): 09/23/2014 9:18:57 AM Confirm and document reason for call. If symptomatic, describe symptoms. ---Caller states that he had a dry mild cough that started on Sunday and then last night started with sore throat. This AM he is coughing up yellow phlegm. Took Mucinex. - No fever. Has the patient traveled out of the country within the last 30 days? ---Not Applicable Does the patient require triage? ---Yes Related visit to physician within the last 2 weeks? ---No Does the PT have any chronic conditions? (i.e. diabetes, asthma, etc.) ---No Guidelines Guideline Title Affirmed Question Affirmed Notes Nurse Date/Time (Eastern Time) Cough - Acute Productive Cough with cold symptoms (e.g., runny nose, postnasal drip, throat clearing) (all triage questions negative) stuffy nose; only feels the sore throat pain with the coughing Markus Daft, RN, Windy 09/23/2014 9:20:40 AM Sore Throat [1] Sore throat with cough/cold symptoms AND [2] present < 5 days (all triage questions negative) Markus Daft, RN, Sherre Poot 09/23/2014 9:29:27 AM Disp. Time Eilene Ghazi Time) Disposition Final User 09/23/2014 9:29:01 [place], RN, Sherre Poot PLEASE NOTE: All timestamps contained within this report are represented as [place] Standard Time. CONFIDENTIALTY NOTICE: This fax transmission is intended only for the addressee. It contains information that is legally privileged, confidential or otherwise protected from use or disclosure. If you are not the intended recipient, you are strictly prohibited from reviewing, disclosing, copying using or disseminating any of this information or taking any action in reliance on or regarding this information. If you have received this fax in error, please notify us immediately by telephone so that we can arrange for its return to [place]. Phone: [phone], Toll-Free: [phone], Fax: [phone] Page: 2 of 3 Call Id: [id number] 09/23/2014 9:30:33 AM Home Care Yes Markus Daft, RN, Kenton Kingfisher Understands: Yes Disagree/Comply: Comply Caller Understands: Yes Disagree/Comply: Comply Care Advice Given Per Guideline HOME CARE: You should be able to treat this at home. REASSURANCE: * It sounds like an uncomplicated cold that we can treat at home. * Colds are very common and may make you feel uncomfortable. * Colds are caused by viruses, and no medicine or 'shot' will cure an uncomplicated cold. Colds are usually not serious. COUGH MEDICINES: - OTC COUGH SYRUPS: The most common cough suppressant in OTC cough medications is dextromethorphan. Often the letters 'DM' appear in the name. - OTC COUGH DROPS: Cough drops can help a lot, especially for mild coughs. They reduce coughing by soothing your irritated throat and removing that tickle sensation in the back of the throat. Cough drops also have the advantage of portability - you can carry them with you. - HOME REMEDY - HONEY: This old home remedy has been shown to help decrease coughing at night. The adult dosage is 2 teaspoons (10 ml) at bedtime. Honey should not be given to infants under one year of age.  FOR A STUFFY NOSE - USE NASAL WASHES:  * Introduction: Saline (salt water) nasal irrigation (nasal wash) is an effective and simple home remedy for treating stuffy nose and sinus congestion. The nose can be irrigated by pouring, spraying, or squirting salt water into the nose and then letting it run back out. * How it Helps: The salt water rinses out excess mucus, washes out any irritants (dust, allergens) that might be present, and moistens the nasal cavity. * Methods: There are several ways to perform nasal irrigation. You can use a saline nasal spray bottle (available over-the-counter), a rubber ear syringe, a medical syringe without the needle, or a NETI POT. FEVER MEDICINES: * For fever relief, take acetaminophen or ibuprofen. * Treat fevers above 101 F (38.3 C). * The goal of fever therapy is to bring the fever down to a comfortable level. Remember that fever medicine usually lowers fever 2-3 F (1-1.5 C). * Before taking any medicine, read all the instructions on the package. CONTAGIOUSNESS: * The cold virus is present in your nasal secretions. * Cover your nose and mouth with a tissue when you sneeze or cough. Wash your hands frequently. * You can return to work or school after the fever is gone and you feel well enough to participate in normal activities. CALL BACK IF: * Fever lasts over 3 days * Nasal discharge lasts over 10 days * Earache or facial pain develops * You become worse. CARE ADVICE given per Cough - Acute Productive (Adult) guideline. HOW TO MAKE SALINE (SALT WATER) NASAL WASH: * You can make your own saline nasal wash. * Add 1/2 tsp of table salt to 1 cup (8 oz; 240 ml) of warm water. * You should use sterile, distilled, or previously boiled water for nasal irrigation. STEP-BY-STEP INSTRUCTIONS: * STEP 1: Lean over a sink. * STEP 2: Gently squirt or spray warm salt water into one of your nostrils. * STEP 3: Some of the water may run into the back of your throat. Spit this out. If you swallow the salt water it  will not hurt you. * STEP 4: Blow your nose to clean out the water and mucus. * STEP 5: Repeat steps 1-4 for the other nostril. You can do this a couple times a day if it seems to help you. HOME CARE: You should be able to treat this at home. REASSURANCE: * Most mild sore throats and intermittent sore throats are just part of a cold and can be treated at home. * The presence of a cough, hoarseness or nasal symptoms points to a viral infection as the cause of the sore throat. SORE THROAT - For relief of sore throat: * Sip warm chicken broth or apple juice. * Suck on hard candy or a throat lozenge (OTC). * Gargle with warm salt water four times a day. To make salt water, put 1/2 teaspoon of salt in 8 oz (240 ml) of warm water. * Avoid cigarette smoke. SOFT DIET: * Eat a soft diet. Cold drinks, popsicles, and milk shakes are especially good. Avoid citrus fruits. * Drink plenty of liquids so as to avoid dehydration (8-12 eight oz glasses each day). NO ANTIBIOTICS: Antibiotics are not helpful for viral sore throats. EXPECTED COURSE: Sore throats with viral illnesses usually last 3 or 4 days. CONTAGIOUSNESS: You can return to work after the fever is gone and you feel well enough to participate in normal activities. CALL BACK IF: * Sore throat with cold symptoms, and sore  throat lasts over 5 days * Fever lasts over 3 days * You become worse CARE ADVICE given per Sore Throat (Adult) guideline. After Care Instructions Given Call Event Type User Date / Time Description PLEASE NOTE: All timestamps contained within this report are represented as [place] Standard Time. CONFIDENTIALTY NOTICE: This fax transmission is intended only for the addressee. It contains information that is legally privileged, confidential or otherwise protected from use or disclosure. If you are not the intended recipient, you are strictly prohibited from reviewing, disclosing, copying using or disseminating any of this information or  taking any action in reliance on or regarding this information. If you have received this fax in error, please notify us immediately by telephone so that we can arrange for its return to [place]. Phone: [phone], Toll-Free: [phone], Fax: [phone] Page: 3 of 3 Call Id: [id number] Comments User: Mayford Knife, RN Date/Time Eilene Ghazi Time): 09/23/2014 9:31:31 AM Caller reassured that antibiotics would not be helpful at this time as most likely viral (per cold/cough/sore throat Guideline). Caller verb. understanding.

## 2014-10-06 ENCOUNTER — Ambulatory Visit: Payer: Medicare Other | Admitting: Family Medicine

## 2014-10-15 ENCOUNTER — Ambulatory Visit (INDEPENDENT_AMBULATORY_CARE_PROVIDER_SITE_OTHER): Payer: Medicare Other

## 2014-10-15 DIAGNOSIS — E538 Deficiency of other specified B group vitamins: Secondary | ICD-10-CM | POA: Diagnosis not present

## 2014-10-15 MED ORDER — CYANOCOBALAMIN 1000 MCG/ML IJ SOLN
1000.0000 ug | Freq: Once | INTRAMUSCULAR | Status: AC
  Administered 2014-10-15: 1000 ug via INTRAMUSCULAR

## 2014-11-14 ENCOUNTER — Ambulatory Visit (INDEPENDENT_AMBULATORY_CARE_PROVIDER_SITE_OTHER): Payer: Medicare Other | Admitting: Geriatric Medicine

## 2014-11-14 DIAGNOSIS — E538 Deficiency of other specified B group vitamins: Secondary | ICD-10-CM | POA: Diagnosis not present

## 2014-11-14 MED ORDER — CYANOCOBALAMIN 1000 MCG/ML IJ SOLN
1000.0000 ug | Freq: Once | INTRAMUSCULAR | Status: AC
  Administered 2014-11-14: 1000 ug via INTRAMUSCULAR

## 2014-12-16 ENCOUNTER — Ambulatory Visit (INDEPENDENT_AMBULATORY_CARE_PROVIDER_SITE_OTHER): Payer: Medicare Other

## 2014-12-16 DIAGNOSIS — D649 Anemia, unspecified: Secondary | ICD-10-CM | POA: Diagnosis not present

## 2014-12-16 MED ORDER — CYANOCOBALAMIN 1000 MCG/ML IJ SOLN
1000.0000 ug | Freq: Once | INTRAMUSCULAR | Status: AC
  Administered 2014-12-16: 1000 ug via INTRAMUSCULAR

## 2014-12-26 ENCOUNTER — Other Ambulatory Visit: Payer: Self-pay | Admitting: Hematology and Oncology

## 2014-12-26 DIAGNOSIS — D472 Monoclonal gammopathy: Secondary | ICD-10-CM

## 2014-12-29 ENCOUNTER — Other Ambulatory Visit (HOSPITAL_BASED_OUTPATIENT_CLINIC_OR_DEPARTMENT_OTHER): Payer: Medicare Other

## 2014-12-29 DIAGNOSIS — D472 Monoclonal gammopathy: Secondary | ICD-10-CM

## 2014-12-29 LAB — COMPREHENSIVE METABOLIC PANEL (CC13)
ALT: 45 U/L (ref 0–55)
AST: 33 U/L (ref 5–34)
Albumin: 4.2 g/dL (ref 3.5–5.0)
Alkaline Phosphatase: 62 U/L (ref 40–150)
Anion Gap: 7 mEq/L (ref 3–11)
BUN: 13.9 mg/dL (ref 7.0–26.0)
CHLORIDE: 105 meq/L (ref 98–109)
CO2: 25 meq/L (ref 22–29)
Calcium: 9.5 mg/dL (ref 8.4–10.4)
Creatinine: 1.1 mg/dL (ref 0.7–1.3)
EGFR: 83 mL/min/{1.73_m2} — ABNORMAL LOW (ref >90)
GLUCOSE: 107 mg/dL (ref 70–140)
POTASSIUM: 4 meq/L (ref 3.5–5.1)
SODIUM: 138 meq/L (ref 136–145)
Total Bilirubin: 1.01 mg/dL (ref 0.20–1.20)
Total Protein: 7.4 g/dL (ref 6.4–8.3)

## 2014-12-29 LAB — CBC WITH DIFFERENTIAL/PLATELET
BASO%: 0.2 % (ref 0.0–2.0)
BASOS ABS: 0 10*3/uL (ref 0.0–0.1)
EOS%: 1.2 % (ref 0.0–7.0)
Eosinophils Absolute: 0.1 10*3/uL (ref 0.0–0.5)
HCT: 40 % (ref 38.4–49.9)
HEMOGLOBIN: 13.6 g/dL (ref 13.0–17.1)
LYMPH%: 29.9 % (ref 14.0–49.0)
MCH: 33.9 pg — AB (ref 27.2–33.4)
MCHC: 34 g/dL (ref 32.0–36.0)
MCV: 99.8 fL — ABNORMAL HIGH (ref 79.3–98.0)
MONO#: 0.5 10*3/uL (ref 0.1–0.9)
MONO%: 7.5 % (ref 0.0–14.0)
NEUT#: 3.7 10*3/uL (ref 1.5–6.5)
NEUT%: 61.2 % (ref 39.0–75.0)
Platelets: 256 10*3/uL (ref 140–400)
RBC: 4.01 10*6/uL — ABNORMAL LOW (ref 4.20–5.82)
RDW: 12.8 % (ref 11.0–14.6)
WBC: 6 10*3/uL (ref 4.0–10.3)
lymph#: 1.8 10*3/uL (ref 0.9–3.3)

## 2014-12-31 LAB — SPEP & IFE WITH QIG
ALBUMIN ELP: 4.2 g/dL (ref 3.8–4.8)
ALPHA-1-GLOBULIN: 0.3 g/dL (ref 0.2–0.3)
Abnormal Protein Band1: 0.7 g/dL
Alpha-2-Globulin: 0.6 g/dL (ref 0.5–0.9)
BETA 2: 0.4 g/dL (ref 0.2–0.5)
Beta Globulin: 0.5 g/dL (ref 0.4–0.6)
GAMMA GLOBULIN: 1.3 g/dL (ref 0.8–1.7)
IGA: 301 mg/dL (ref 68–379)
IGG (IMMUNOGLOBIN G), SERUM: 1350 mg/dL (ref 650–1600)
IGM, SERUM: 26 mg/dL — AB (ref 41–251)
TOTAL PROTEIN, SERUM ELECTROPHOR: 7.2 g/dL (ref 6.1–8.1)

## 2014-12-31 LAB — KAPPA/LAMBDA LIGHT CHAINS
KAPPA FREE LGHT CHN: 6.18 mg/dL — AB (ref 0.33–1.94)
KAPPA LAMBDA RATIO: 4.58 — AB (ref 0.26–1.65)
LAMBDA FREE LGHT CHN: 1.35 mg/dL (ref 0.57–2.63)

## 2015-01-02 DIAGNOSIS — C61 Malignant neoplasm of prostate: Secondary | ICD-10-CM | POA: Diagnosis not present

## 2015-01-05 ENCOUNTER — Telehealth: Payer: Self-pay | Admitting: Hematology and Oncology

## 2015-01-05 ENCOUNTER — Encounter: Payer: Self-pay | Admitting: Hematology and Oncology

## 2015-01-05 ENCOUNTER — Ambulatory Visit (HOSPITAL_BASED_OUTPATIENT_CLINIC_OR_DEPARTMENT_OTHER): Payer: Medicare Other | Admitting: Hematology and Oncology

## 2015-01-05 VITALS — BP 102/63 | HR 67 | Temp 98.2°F | Resp 18 | Ht 71.0 in | Wt 157.2 lb

## 2015-01-05 DIAGNOSIS — Z299 Encounter for prophylactic measures, unspecified: Secondary | ICD-10-CM | POA: Insufficient documentation

## 2015-01-05 DIAGNOSIS — Z23 Encounter for immunization: Secondary | ICD-10-CM

## 2015-01-05 DIAGNOSIS — C61 Malignant neoplasm of prostate: Secondary | ICD-10-CM

## 2015-01-05 DIAGNOSIS — D472 Monoclonal gammopathy: Secondary | ICD-10-CM

## 2015-01-05 MED ORDER — INFLUENZA VAC SPLIT QUAD 0.5 ML IM SUSY
0.5000 mL | PREFILLED_SYRINGE | Freq: Once | INTRAMUSCULAR | Status: AC
  Administered 2015-01-05: 0.5 mL via INTRAMUSCULAR
  Filled 2015-01-05: qty 0.5

## 2015-01-05 NOTE — Assessment & Plan Note (Signed)
We discussed the importance of preventive care and reviewed the vaccination programs. He does not have any prior allergic reactions to influenza vaccination. He agrees to proceed with influenza vaccination today and we will administer it today at the clinic.  

## 2015-01-05 NOTE — Assessment & Plan Note (Signed)
He continues close follow-up with urologist. Clinically, he has no signs and symptoms of recurrence of prostate cancer.

## 2015-01-05 NOTE — Assessment & Plan Note (Signed)
Clinically, he has no signs of disease progression. Plan to see him next year in with repeat blood work, examination and blood work. 

## 2015-01-05 NOTE — Telephone Encounter (Signed)
lvm for pt regarding to Sept 2017 appt...mailed pt appt sched/avs and letter

## 2015-01-05 NOTE — Progress Notes (Signed)
[place] OFFICE PROGRESS NOTE  Patient Care Team: Biagio Borg, MD as PCP - General Clearnce Sorrel, MD as Attending Physician (Neurology) Earnie Larsson, MD (Neurosurgery) Heath Lark, MD as Consulting Physician (Hematology and Oncology)  SUMMARY OF ONCOLOGIC HISTORY:  DIAGNOSIS: Resected prostate cancer, monoclonal gammopathy of unknown significance and vitamin B12 deficiency anemia  SUMMARY OF ONCOLOGIC HISTORY: This is a pleasant gentleman who was diagnosed with monoclonal gammopathy of unknown significance in 2013. The patient has history of anemia. Subsequent evaluation found that the patient had B12 deficiency. He was placed on B12 replacement therapy. 24-hour urine collection x-ray and blood work did not show evidence of end organ damage and he is being observed. He also has history of prostate cancer status post resection. His follow closely with his urologist.  INTERVAL HISTORY: Please see below for problem oriented charting. He feels well. Denies recent infection. No new bone pain. Denies new urinary difficulties. He follows with urologist closely for history of prostate cancer.  REVIEW OF SYSTEMS:   Constitutional: Denies fevers, chills or abnormal weight loss Eyes: Denies blurriness of vision Ears, nose, mouth, throat, and face: Denies mucositis or sore throat Respiratory: Denies cough, dyspnea or wheezes Cardiovascular: Denies palpitation, chest discomfort or lower extremity swelling Gastrointestinal:  Denies nausea, heartburn or change in bowel habits Skin: Denies abnormal skin rashes Lymphatics: Denies new lymphadenopathy or easy bruising Neurological:Denies numbness, tingling or new weaknesses Behavioral/Psych: Mood is stable, no new changes  All other systems were reviewed with the patient and are negative.  I have reviewed the past medical history, past surgical history, social history and family history with the patient and they are unchanged from  previous note.  ALLERGIES:  has No Known Allergies.  MEDICATIONS:  Current Outpatient Prescriptions  Medication Sig Dispense Refill  . allopurinol (ZYLOPRIM) 100 MG tablet TAKE 2 TABS BY MOUTH DAILY. 180 tablet 3  . amLODipine (NORVASC) 5 MG tablet Take 1 tablet (5 mg total) by mouth daily. 90 tablet 3  . aspirin 81 MG chewable tablet Chew 81 mg by mouth daily.    Marland Kitchen atorvastatin (LIPITOR) 40 MG tablet Take 1 tablet (40 mg total) by mouth daily. 90 tablet 3  . gabapentin (NEURONTIN) 100 MG capsule Take 1 capsule (100 mg total) by mouth 2 (two) times daily. 180 capsule 3  . gabapentin (NEURONTIN) 300 MG capsule Take 1-2 capsules (300-600 mg total) by mouth at bedtime as needed. For leg cramping. 180 capsule 3  . irbesartan (AVAPRO) 300 MG tablet Take 1 tablet (300 mg total) by mouth at bedtime. 90 tablet 3  . PRESCRIPTION MEDICATION Matrex injection at home every other day    . tiZANidine (ZANAFLEX) 4 MG tablet Take 1 tablet (4 mg total) by mouth 3 (three) times daily as needed for muscle spasms. 270 tablet 1  . traMADol (ULTRAM) 50 MG tablet Take 1 tablet (50 mg total) by mouth 2 (two) times daily as needed. 180 tablet 1   No current facility-administered medications for this visit.    PHYSICAL EXAMINATION: ECOG PERFORMANCE STATUS: 0 - Asymptomatic  Filed Vitals:   01/05/15 0951  BP: 102/63  Pulse: 67  Temp: 98.2 F (36.8 C)  Resp: 18   Filed Weights   01/05/15 0951  Weight: 157 lb 3.2 oz (71.305 kg)    GENERAL:alert, no distress and comfortable SKIN: skin color, texture, turgor are normal, no rashes or significant lesions EYES: normal, Conjunctiva are pink and non-injected, sclera clear OROPHARYNX:no exudate, no  erythema and lips, buccal mucosa, and tongue normal  NECK: supple, thyroid normal size, non-tender, without nodularity LYMPH:  no palpable lymphadenopathy in the cervical, axillary or inguinal LUNGS: clear to auscultation and percussion with normal breathing  effort HEART: regular rate & rhythm and no murmurs and no lower extremity edema ABDOMEN:abdomen soft, non-tender and normal bowel sounds Musculoskeletal:no cyanosis of digits and no clubbing  NEURO: alert & oriented x 3 with fluent speech, no focal motor/sensory deficits  LABORATORY DATA:  I have reviewed the data as listed    Component Value Date/Time   NA 138 12/29/2014 0937   NA 138 05/14/2014 1654   K 4.0 12/29/2014 0937   K 4.2 05/14/2014 1654   CL 105 05/14/2014 1654   CL 104 06/28/2012 1010   CO2 25 12/29/2014 0937   CO2 28 05/14/2014 1654   GLUCOSE 107 12/29/2014 0937   GLUCOSE 100* 05/14/2014 1654   GLUCOSE 127* 06/28/2012 1010   BUN 13.9 12/29/2014 0937   BUN 17 05/14/2014 1654   CREATININE 1.1 12/29/2014 0937   CREATININE 1.03 05/14/2014 1654   CALCIUM 9.5 12/29/2014 0937   CALCIUM 9.8 05/14/2014 1654   PROT 7.4 12/29/2014 0937   PROT 7.3 05/14/2014 1654   ALBUMIN 4.2 12/29/2014 0937   ALBUMIN 4.6 05/14/2014 1654   AST 33 12/29/2014 0937   AST 27 05/14/2014 1654   ALT 45 12/29/2014 0937   ALT 27 05/14/2014 1654   ALKPHOS 62 12/29/2014 0937   ALKPHOS 65 05/14/2014 1654   BILITOT 1.01 12/29/2014 0937   BILITOT 0.9 05/14/2014 1654   GFRNONAA 76* 11/03/2011 1645   GFRAA 88* 11/03/2011 1645    No results found for: SPEP, UPEP  Lab Results  Component Value Date   WBC 6.0 12/29/2014   NEUTROABS 3.7 12/29/2014   HGB 13.6 12/29/2014   HCT 40.0 12/29/2014   MCV 99.8* 12/29/2014   PLT 256 12/29/2014      Chemistry      Component Value Date/Time   NA 138 12/29/2014 0937   NA 138 05/14/2014 1654   K 4.0 12/29/2014 0937   K 4.2 05/14/2014 1654   CL 105 05/14/2014 1654   CL 104 06/28/2012 1010   CO2 25 12/29/2014 0937   CO2 28 05/14/2014 1654   BUN 13.9 12/29/2014 0937   BUN 17 05/14/2014 1654   CREATININE 1.1 12/29/2014 0937   CREATININE 1.03 05/14/2014 1654      Component Value Date/Time   CALCIUM 9.5 12/29/2014 0937   CALCIUM 9.8 05/14/2014 1654    ALKPHOS 62 12/29/2014 0937   ALKPHOS 65 05/14/2014 1654   AST 33 12/29/2014 0937   AST 27 05/14/2014 1654   ALT 45 12/29/2014 0937   ALT 27 05/14/2014 1654   BILITOT 1.01 12/29/2014 0937   BILITOT 0.9 05/14/2014 1654     ASSESSMENT & PLAN:  MGUS (monoclonal gammopathy of unknown significance) Clinically, he has no signs of disease progression. Plan to see him next year in with repeat blood work, examination and blood work.    Prostate cancer He continues close follow-up with urologist. Clinically, he has no signs and symptoms of recurrence of prostate cancer.    Preventive measure We discussed the importance of preventive care and reviewed the vaccination programs. He does not have any prior allergic reactions to influenza vaccination. He agrees to proceed with influenza vaccination today and we will administer it today at the clinic.    Orders Placed This Encounter  Procedures  .  Comprehensive metabolic panel    Standing Status: Future     Number of Occurrences:      Standing Expiration Date: 02/09/2016  . CBC with Differential/Platelet    Standing Status: Future     Number of Occurrences:      Standing Expiration Date: 02/09/2016  . SPEP & IFE with QIG    Standing Status: Future     Number of Occurrences:      Standing Expiration Date: 02/09/2016  . Kappa/lambda light chains    Standing Status: Future     Number of Occurrences:      Standing Expiration Date: 02/09/2016   All questions were answered. The patient knows to call the clinic with any problems, questions or concerns. No barriers to learning was detected. I spent 15 minutes counseling the patient face to face. The total time spent in the appointment was 20 minutes and more than 50% was on counseling and review of test results     [facility], [place], MD 01/05/2015 10:13 AM

## 2015-01-15 ENCOUNTER — Ambulatory Visit (INDEPENDENT_AMBULATORY_CARE_PROVIDER_SITE_OTHER): Payer: Medicare Other

## 2015-01-15 DIAGNOSIS — E538 Deficiency of other specified B group vitamins: Secondary | ICD-10-CM | POA: Diagnosis not present

## 2015-01-15 DIAGNOSIS — C61 Malignant neoplasm of prostate: Secondary | ICD-10-CM | POA: Diagnosis not present

## 2015-01-15 MED ORDER — CYANOCOBALAMIN 1000 MCG/ML IJ SOLN
1000.0000 ug | Freq: Once | INTRAMUSCULAR | Status: AC
  Administered 2015-01-15: 1000 ug via INTRAMUSCULAR

## 2015-02-10 ENCOUNTER — Other Ambulatory Visit: Payer: Self-pay | Admitting: Internal Medicine

## 2015-02-17 ENCOUNTER — Ambulatory Visit (INDEPENDENT_AMBULATORY_CARE_PROVIDER_SITE_OTHER): Payer: Medicare Other

## 2015-02-17 DIAGNOSIS — E538 Deficiency of other specified B group vitamins: Secondary | ICD-10-CM | POA: Diagnosis not present

## 2015-02-17 MED ORDER — CYANOCOBALAMIN 1000 MCG/ML IJ SOLN
1000.0000 ug | Freq: Once | INTRAMUSCULAR | Status: AC
  Administered 2015-02-17: 1000 ug via INTRAMUSCULAR

## 2015-02-17 MED ORDER — IRBESARTAN 300 MG PO TABS: 300.0000 mg | ORAL_TABLET | Freq: Every day | ORAL | Status: DC

## 2015-02-25 DIAGNOSIS — N3946 Mixed incontinence: Secondary | ICD-10-CM | POA: Diagnosis not present

## 2015-03-17 DIAGNOSIS — N3946 Mixed incontinence: Secondary | ICD-10-CM | POA: Diagnosis not present

## 2015-03-18 ENCOUNTER — Ambulatory Visit (INDEPENDENT_AMBULATORY_CARE_PROVIDER_SITE_OTHER): Payer: Medicare Other

## 2015-03-18 DIAGNOSIS — E538 Deficiency of other specified B group vitamins: Secondary | ICD-10-CM | POA: Diagnosis not present

## 2015-03-18 MED ORDER — CYANOCOBALAMIN 1000 MCG/ML IJ SOLN
1000.0000 ug | Freq: Once | INTRAMUSCULAR | Status: AC
  Administered 2015-03-18: 1000 ug via INTRAMUSCULAR

## 2015-03-19 ENCOUNTER — Ambulatory Visit: Payer: Medicare Other

## 2015-03-27 DIAGNOSIS — N3946 Mixed incontinence: Secondary | ICD-10-CM | POA: Diagnosis not present

## 2015-04-17 ENCOUNTER — Ambulatory Visit (INDEPENDENT_AMBULATORY_CARE_PROVIDER_SITE_OTHER): Payer: Medicare Other

## 2015-04-17 DIAGNOSIS — E538 Deficiency of other specified B group vitamins: Secondary | ICD-10-CM

## 2015-04-17 MED ORDER — CYANOCOBALAMIN 1000 MCG/ML IJ SOLN
1000.0000 ug | Freq: Once | INTRAMUSCULAR | Status: AC
  Administered 2015-04-17: 1000 ug via INTRAMUSCULAR

## 2015-05-20 ENCOUNTER — Ambulatory Visit (INDEPENDENT_AMBULATORY_CARE_PROVIDER_SITE_OTHER): Payer: Medicare Other

## 2015-05-20 DIAGNOSIS — E538 Deficiency of other specified B group vitamins: Secondary | ICD-10-CM | POA: Diagnosis not present

## 2015-05-20 MED ORDER — CYANOCOBALAMIN 1000 MCG/ML IJ SOLN
1000.0000 ug | Freq: Once | INTRAMUSCULAR | Status: AC
  Administered 2015-05-20: 1000 ug via INTRAMUSCULAR

## 2015-05-22 ENCOUNTER — Other Ambulatory Visit: Payer: Self-pay | Admitting: Internal Medicine

## 2015-05-25 ENCOUNTER — Telehealth: Payer: Self-pay | Admitting: Internal Medicine

## 2015-05-25 MED ORDER — TRAMADOL HCL 50 MG PO TABS: 50.0000 mg | ORAL_TABLET | Freq: Two times a day (BID) | ORAL | Status: DC | PRN

## 2015-05-25 NOTE — Telephone Encounter (Signed)
This has been faxed.

## 2015-05-25 NOTE — Telephone Encounter (Signed)
Done hardcopy to steph 

## 2015-06-17 ENCOUNTER — Ambulatory Visit (INDEPENDENT_AMBULATORY_CARE_PROVIDER_SITE_OTHER): Payer: Medicare Other

## 2015-06-17 DIAGNOSIS — E538 Deficiency of other specified B group vitamins: Secondary | ICD-10-CM

## 2015-06-17 MED ORDER — CYANOCOBALAMIN 1000 MCG/ML IJ SOLN
1000.0000 ug | Freq: Once | INTRAMUSCULAR | Status: AC
  Administered 2015-06-17: 1000 ug via INTRAMUSCULAR

## 2015-07-02 DIAGNOSIS — N393 Stress incontinence (female) (male): Secondary | ICD-10-CM | POA: Diagnosis not present

## 2015-07-02 DIAGNOSIS — Z0000 Encounter for general adult medical examination without abnormal findings: Secondary | ICD-10-CM | POA: Diagnosis not present

## 2015-07-02 DIAGNOSIS — R350 Frequency of micturition: Secondary | ICD-10-CM | POA: Diagnosis not present

## 2015-07-02 DIAGNOSIS — N3946 Mixed incontinence: Secondary | ICD-10-CM | POA: Diagnosis not present

## 2015-07-11 ENCOUNTER — Other Ambulatory Visit: Payer: Self-pay | Admitting: Internal Medicine

## 2015-07-20 ENCOUNTER — Ambulatory Visit (INDEPENDENT_AMBULATORY_CARE_PROVIDER_SITE_OTHER): Payer: Medicare Other | Admitting: Family Medicine

## 2015-07-20 ENCOUNTER — Encounter: Payer: Self-pay | Admitting: Family Medicine

## 2015-07-20 VITALS — BP 104/70 | HR 66 | Ht 71.5 in | Wt 157.0 lb

## 2015-07-20 DIAGNOSIS — M12812 Other specific arthropathies, not elsewhere classified, left shoulder: Secondary | ICD-10-CM

## 2015-07-20 DIAGNOSIS — E538 Deficiency of other specified B group vitamins: Secondary | ICD-10-CM

## 2015-07-20 DIAGNOSIS — M5010 Cervical disc disorder with radiculopathy, unspecified cervical region: Secondary | ICD-10-CM

## 2015-07-20 DIAGNOSIS — M12512 Traumatic arthropathy, left shoulder: Secondary | ICD-10-CM | POA: Diagnosis not present

## 2015-07-20 DIAGNOSIS — M75102 Unspecified rotator cuff tear or rupture of left shoulder, not specified as traumatic: Secondary | ICD-10-CM

## 2015-07-20 MED ORDER — GABAPENTIN 100 MG PO CAPS: 200.0000 mg | ORAL_CAPSULE | Freq: Two times a day (BID) | ORAL | Status: DC

## 2015-07-20 MED ORDER — CYANOCOBALAMIN 1000 MCG/ML IJ SOLN
1000.0000 ug | Freq: Once | INTRAMUSCULAR | Status: AC
  Administered 2015-07-20: 1000 ug via INTRAMUSCULAR

## 2015-07-20 NOTE — Progress Notes (Signed)
Pre visit review using our clinic review tool, if applicable. No additional management support is needed unless otherwise documented below in the visit note. 

## 2015-07-20 NOTE — Assessment & Plan Note (Signed)
I do believe the patient's cervical radiculopathy is causing the weakness in his shoulders. Patient does have some rotator cuff arthropathy of the shoulders as well. History of gout. We discussed with him to possibly increase his allopurinol which she declined. We didn't increase his gabapentin to 200 mg in the morning and 200 mg in the afternoon continuing to 300 at night. Patient given trial of topical anti-inflammatories. Patient has tramadol for breakthrough pain. Worsening symptoms he'll come back sooner but otherwise she will come back and see me again in 6 weeks.

## 2015-07-20 NOTE — Assessment & Plan Note (Signed)
Given injection today.  

## 2015-07-20 NOTE — Assessment & Plan Note (Signed)
Given topical anti-inflammatories. We discussed continuing to monitor. Did not feel that he had any significant improving from the injections previously. We discussed over-the-counter medications a could be beneficial. We discussed lifting mechanics. Follow-up again in 6 weeks.

## 2015-07-20 NOTE — Patient Instructions (Signed)
Good to see you  Ice 20 minutes 2 times daily. Usually after activity and before bed. pennsaid pinkie amount topically 2 times daily as needed.  Gabapentin 200mg  in AM, 200mg  in PM and 300mg  at bedtime Tylenol 500mg  3 times a day  Continue the vitamin D Iron 65mg  daily with 500mg  of vitamin C  See me again in 6 weeks.

## 2015-07-20 NOTE — Progress Notes (Signed)
[facility] [place], [place] [address] Phone: [phone] Subjective:    I'm seeing this patient by the request  of:  Nathan Cower, MD   CC: left shoulder pain and neck pain f/u   [id number] Nathan Wallace is a 65 y.o. male coming in with complaint of left shoulder pain and neck pain follow up. Of note patient does have a past medical history significant for C4 through C7 fusion. She was started on gabapentin at last visit and titrated up to 200 mg the morning, 200 mg in the afternoon 300 mg at night. Patient states he is only been doing 100 mg in the morning and 300 mg at night. States that unfortunately the pain seems to be now more in bilateral shoulders. Patient states that and unfortunately is accompanied with weakness. States that any type of overhead activity within seconds she starts have a burning sensation in his muscles. Has noticed that he is losing some muscle mass as well. Patient denies any pain when he is doing regular daily activities underneath his shoulders. States though anything greater than 90 he has pain. Sometimes feels like he has had a grinding sensation in his neck as well as his shoulders. Denies any numbness. States that it feels different than prior to his surgery for his neck. Rates the severity of pain though is 8 out of 10. Once the pain does started stays around for hours.  Past Medical History  Diagnosis Date  . Prostate cancer ([place]) 2011  . Hyperlipidemia     border line  . GERD (gastroesophageal reflux disease)     NO RECENT PROBLEMS AND NO MEDS  . Arthritis     RT KNEE AND RT SHOULDER  . Impaired glucose tolerance 06/22/2011  . Degenerative joint disease of knee, right   . Benign head tremor   . Left inguinal hernia   . CVA (cerebrovascular accident due to intracerebral hemorrhage) ([place]) 08/03/2011  . MGUS (monoclonal gammopathy of unknown significance)   . Syncope 2012    after prostate surgery; neg  neurology work up.   . Depression 07/26/2012  . Stroke [facility])     2013   Past Surgical History  Procedure Laterality Date  . Spine surgery  2003 OR 2004    CERVICAL FUSION-STATES LIMITED ROM NECK  . Wrist surgery    . Inguinal hernia repair  06/01/2011    Procedure: HERNIA REPAIR INGUINAL ADULT;  Surgeon: Adin Hector, MD;  Location: [facility] ORS;  Service: General;  Laterality: Left;  Left Inguinal Hernia Repair with Mesh  . Robot assisted laparoscopic radical prostatectomy  jan 2012    da vinci  . Colonoscopy     Social History  Substance Use Topics  . Smoking status: Former Smoker    Quit date: 06/28/1966  . Smokeless tobacco: Never Used     Comment: QUIT SMOKING IN THE 70'S  . Alcohol Use: 3.0 oz/week    5 Cans of beer per week     Comment: 5th a liquor a week beer and vodka   No Known Allergies Family History  Problem Relation Age of Onset  . Hypertension Mother   . Aneurysm Mother     brain  . Cancer Father     colon  . Colon cancer Father   . Heart attack Brother         Past medical history, social, surgical and family history all reviewed in electronic medical record.  Review of Systems: No headache, visual changes, nausea, vomiting, diarrhea, constipation, dizziness, abdominal pain, skin rash, fevers, chills, night sweats, weight loss, swollen lymph nodes, body aches, joint swelling, muscle aches, chest pain, shortness of breath, mood changes.   Objective Blood pressure 104/70, pulse 66, height 5' 11.5" (1.816 m), weight 157 lb (71.215 kg), SpO2 97 %.  General: No apparent distress alert and oriented x3 mood and affect normal, dressed appropriately.  HEENT: Pupils equal, extraocular movements intact  Respiratory: Patient's speak in full sentences and does not appear short of breath  Cardiovascular: No lower extremity edema, non tender, no erythema  Skin: Warm dry intact with no signs of infection or rash on extremities or on axial skeleton.  Abdomen: Soft  nontender  Neuro: Cranial nerves II through XII are intact, neurovascularly intact in all extremities with 2+ DTRs and 2+ pulses.  Lymph: No lymphadenopathy of posterior or anterior cervical chain or axillae bilaterally.  Gait normal with good balance and coordination.  MSK:  Non tender with full range of motion and good stability and symmetric strength and tone of  elbows, wrist, hip, knee and ankles bilaterally. Operative changes of multiple joints  Neck: Inspection shows loss of lordosis No palpable stepoffs. Positive Spurling's maneuver noted. Limited range of motion in flexion-extension as well as side bending secondary to fusion Grip strength and sensation normal in bilateral hands Strength good C4 to T1 distribution, with mild weakness of the T1 distribution compared to the contralateral side No sensory change to C4 to T1 Negative Hoffman sign bilaterally Reflexes normal Shoulder:bilateral Atrophy of the shoulder girdle musculature bilaterally. Palpation is normal with no tenderness over AC joint or bicipital groove. ROM is full in all planes.mild crepitus with range of motion Rotator cuff strength 4-5 but symmetric No signs of impingement with negative Neer and Hawkin's tests, empty can sign. Speeds and Yergason's tests normal. Positive labral pathology Normal scapular function observed. Mild positive painful arc bilaterally No apprehension sign      Impression and Recommendations:     This case required medical decision making of moderate complexity.

## 2015-07-22 DIAGNOSIS — C61 Malignant neoplasm of prostate: Secondary | ICD-10-CM | POA: Diagnosis not present

## 2015-07-29 DIAGNOSIS — Z0000 Encounter for general adult medical examination without abnormal findings: Secondary | ICD-10-CM | POA: Diagnosis not present

## 2015-07-29 DIAGNOSIS — C61 Malignant neoplasm of prostate: Secondary | ICD-10-CM | POA: Diagnosis not present

## 2015-08-15 ENCOUNTER — Other Ambulatory Visit: Payer: Self-pay | Admitting: Internal Medicine

## 2015-08-17 ENCOUNTER — Telehealth: Payer: Self-pay | Admitting: Internal Medicine

## 2015-08-17 NOTE — Telephone Encounter (Signed)
Left patient vm to call back to schedule CPE

## 2015-08-17 NOTE — Telephone Encounter (Signed)
Noted thanks °

## 2015-08-19 ENCOUNTER — Ambulatory Visit (INDEPENDENT_AMBULATORY_CARE_PROVIDER_SITE_OTHER): Payer: Medicare Other

## 2015-08-19 DIAGNOSIS — E538 Deficiency of other specified B group vitamins: Secondary | ICD-10-CM | POA: Diagnosis not present

## 2015-08-19 MED ORDER — CYANOCOBALAMIN 1000 MCG/ML IJ SOLN
1000.0000 ug | Freq: Once | INTRAMUSCULAR | Status: AC
  Administered 2015-08-19: 1000 ug via INTRAMUSCULAR

## 2015-08-31 ENCOUNTER — Ambulatory Visit (INDEPENDENT_AMBULATORY_CARE_PROVIDER_SITE_OTHER): Payer: Medicare Other | Admitting: Family Medicine

## 2015-08-31 ENCOUNTER — Encounter: Payer: Self-pay | Admitting: Family Medicine

## 2015-08-31 VITALS — BP 122/72 | HR 66 | Ht 71.5 in | Wt 161.0 lb

## 2015-08-31 DIAGNOSIS — M5010 Cervical disc disorder with radiculopathy, unspecified cervical region: Secondary | ICD-10-CM

## 2015-08-31 MED ORDER — GABAPENTIN 300 MG PO CAPS: 600.0000 mg | ORAL_CAPSULE | Freq: Three times a day (TID) | ORAL | Status: DC

## 2015-08-31 NOTE — Patient Instructions (Signed)
Good to see yo u We will increase the dose of the gabapentin to 300 mg in AM and can take another one in the midday and then 600mg  at  Night New prescription coming in the mail Keep doing the vitamin D You have the muscle relaxer if you need it See me again in 2-3 months.

## 2015-08-31 NOTE — Progress Notes (Signed)
[name] [address], [place] [address] Phone: [phone] Subjective:    I'm seeing this patient by the request  of:  Cathlean Cower, MD   CC: left shoulder pain and neck pain f/u   [id number] Nathan Wallace is a 65 y.o. male coming in with complaint of left shoulder pain and neck pain follow up. Of note patient does have a past medical history significant for C4 through C7 fusion. He was started on gabapentin at last visit and titrated up to 200 mg the morning, 200 mg in the afternoon 300 mg at night. Patient did not increase the gabapentin like he is supposed to. Sometimes takes 300 mg in the morning and 600 mg at night. Tries to consider how much work yesterday the next day. States that it has helped some of the numbness in his fingers. Patient denies any worsening symptoms but would state that he has not had any significant improvement. Continues to try to stay active.  Past Medical History  Diagnosis Date  . Prostate cancer ([place]) 2011  . Hyperlipidemia     border line  . GERD (gastroesophageal reflux disease)     NO RECENT PROBLEMS AND NO MEDS  . Arthritis     RT KNEE AND RT SHOULDER  . Impaired glucose tolerance 06/22/2011  . Degenerative joint disease of knee, right   . Benign head tremor   . Left inguinal hernia   . CVA (cerebrovascular accident due to intracerebral hemorrhage) ([place]) 08/03/2011  . MGUS (monoclonal gammopathy of unknown significance)   . Syncope 2012    after prostate surgery; neg neurology work up.   . Depression 07/26/2012  . Stroke [facility])     2013   Past Surgical History  Procedure Laterality Date  . Spine surgery  2003 OR 2004    CERVICAL FUSION-STATES LIMITED ROM NECK  . Wrist surgery    . Inguinal hernia repair  06/01/2011    Procedure: HERNIA REPAIR INGUINAL ADULT;  Surgeon: Adin Hector, MD;  Location: [facility];  Service: General;  Laterality: Left;  Left Inguinal Hernia Repair with Mesh  . Robot  assisted laparoscopic radical prostatectomy  jan 2012    da vinci  . Colonoscopy     Social History  Substance Use Topics  . Smoking status: Former Smoker    Quit date: 06/28/1966  . Smokeless tobacco: Never Used     Comment: QUIT SMOKING IN THE 70'S  . Alcohol Use: 3.0 oz/week    5 Cans of beer per week     Comment: 5th a liquor a week beer and vodka   No Known Allergies Family History  Problem Relation Age of Onset  . Hypertension Mother   . Aneurysm Mother     brain  . Cancer Father     colon  . Colon cancer Father   . Heart attack Brother         Past medical history, social, surgical and family history all reviewed in electronic medical record.   Review of Systems: No headache, visual changes, nausea, vomiting, diarrhea, constipation, dizziness, abdominal pain, skin rash, fevers, chills, night sweats, weight loss, swollen lymph nodes, body aches, joint swelling, muscle aches, chest pain, shortness of breath, mood changes.   Objective Blood pressure 122/72, pulse 66, height 5' 11.5" (1.816 m), weight 161 lb (73.029 kg), SpO2 99 %.  General: No apparent distress alert and oriented x3 mood and affect normal, dressed appropriately.  HEENT: Pupils  equal, extraocular movements intact  Respiratory: Patient's speak in full sentences and does not appear short of breath  Cardiovascular: No lower extremity edema, non tender, no erythema  Skin: Warm dry intact with no signs of infection or rash on extremities or on axial skeleton.  Abdomen: Soft nontender  Neuro: Cranial nerves II through XII are intact, neurovascularly intact in all extremities with 2+ DTRs and 2+ pulses.  Lymph: No lymphadenopathy of posterior or anterior cervical chain or axillae bilaterally.  Gait normal with good balance and coordination.  MSK:  Non tender with full range of motion and good stability and symmetric strength and tone of  elbows, wrist, hip, knee and ankles bilaterally. Operative changes of  multiple joints  Neck: Inspection shows loss of lordosis No palpable stepoffs. Positive Spurling's maneuver noted.Not the same as previous exam Limited range of motion in flexion-extension as well as side bending secondary to fusion Grip strength and sensation normal in bilateral hands Strength good C4 to T1 distribution, with mild weakness of the T1 distribution compared to the contralateral side No sensory change to C4 to T1 Negative Hoffman sign bilaterally Reflexes normal Shoulder:bilateral Atrophy of the shoulder girdle musculature bilaterally. Palpation is normal with no tenderness over AC joint or bicipital groove. ROM is full in all planes.mild crepitus with range of motion Rotator cuff strength 4-5 but symmetric No signs of impingement with negative Neer and Hawkin's tests, empty can sign. Speeds and Yergason's tests normal. Negative Normal scapular function observed. Mild positive painful arc bilaterally No apprehension sign Minimal change from previous exam     Impression and Recommendations:     This case required medical decision making of moderate complexity.

## 2015-08-31 NOTE — Assessment & Plan Note (Signed)
No significant improvement at this time. Radicular symptoms may be a little bit better. No side effects to the gabapentin and we will refill him at a higher dose. Patient was doing 300 mg in the morning, 300 mg in the afternoon and encourage him to try 600 mg at night. We discussed how this can be beneficial. Encourage him to do the exercises on a more regular basis. Continue over-the-counter supplementations. Follow-up in 2-3 months. Spent  25 minutes with patient face-to-face and had greater than 50% of counseling including as described above in assessment and plan.

## 2015-08-31 NOTE — Progress Notes (Signed)
Pre visit review using our clinic review tool, if applicable. No additional management support is needed unless otherwise documented below in the visit note. 

## 2015-09-03 DIAGNOSIS — Z0000 Encounter for general adult medical examination without abnormal findings: Secondary | ICD-10-CM | POA: Diagnosis not present

## 2015-09-03 DIAGNOSIS — R350 Frequency of micturition: Secondary | ICD-10-CM | POA: Diagnosis not present

## 2015-09-03 DIAGNOSIS — N3946 Mixed incontinence: Secondary | ICD-10-CM | POA: Diagnosis not present

## 2015-09-12 ENCOUNTER — Other Ambulatory Visit: Payer: Self-pay | Admitting: Internal Medicine

## 2015-09-23 ENCOUNTER — Encounter: Payer: Self-pay | Admitting: Internal Medicine

## 2015-09-23 ENCOUNTER — Other Ambulatory Visit (INDEPENDENT_AMBULATORY_CARE_PROVIDER_SITE_OTHER): Payer: Medicare Other

## 2015-09-23 ENCOUNTER — Ambulatory Visit (INDEPENDENT_AMBULATORY_CARE_PROVIDER_SITE_OTHER): Payer: Medicare Other | Admitting: Internal Medicine

## 2015-09-23 VITALS — BP 122/78 | HR 74 | Temp 98.4°F | Resp 20 | Wt 159.0 lb

## 2015-09-23 DIAGNOSIS — R6889 Other general symptoms and signs: Secondary | ICD-10-CM

## 2015-09-23 DIAGNOSIS — E785 Hyperlipidemia, unspecified: Secondary | ICD-10-CM | POA: Diagnosis not present

## 2015-09-23 DIAGNOSIS — R7302 Impaired glucose tolerance (oral): Secondary | ICD-10-CM

## 2015-09-23 DIAGNOSIS — Z0001 Encounter for general adult medical examination with abnormal findings: Secondary | ICD-10-CM

## 2015-09-23 DIAGNOSIS — Z1159 Encounter for screening for other viral diseases: Secondary | ICD-10-CM

## 2015-09-23 DIAGNOSIS — M2550 Pain in unspecified joint: Secondary | ICD-10-CM

## 2015-09-23 DIAGNOSIS — I10 Essential (primary) hypertension: Secondary | ICD-10-CM

## 2015-09-23 LAB — LIPID PANEL
CHOL/HDL RATIO: 3
Cholesterol: 135 mg/dL (ref 0–200)
HDL: 52.9 mg/dL (ref >39.00)
LDL Cholesterol: 63 mg/dL (ref 0–99)
NONHDL: 81.84
Triglycerides: 96 mg/dL (ref 0.0–149.0)
VLDL: 19.2 mg/dL (ref 0.0–40.0)

## 2015-09-23 LAB — CBC WITH DIFFERENTIAL/PLATELET
BASOS PCT: 0.5 % (ref 0.0–3.0)
Basophils Absolute: 0 10*3/uL (ref 0.0–0.1)
EOS PCT: 1.8 % (ref 0.0–5.0)
Eosinophils Absolute: 0.1 10*3/uL (ref 0.0–0.7)
HEMATOCRIT: 37.6 % — AB (ref 39.0–52.0)
Hemoglobin: 12.7 g/dL — ABNORMAL LOW (ref 13.0–17.0)
LYMPHS PCT: 33.2 % (ref 12.0–46.0)
Lymphs Abs: 2.6 10*3/uL (ref 0.7–4.0)
MCHC: 33.8 g/dL (ref 30.0–36.0)
MCV: 99.2 fl (ref 78.0–100.0)
MONOS PCT: 8.7 % (ref 3.0–12.0)
Monocytes Absolute: 0.7 10*3/uL (ref 0.1–1.0)
NEUTROS ABS: 4.3 10*3/uL (ref 1.4–7.7)
Neutrophils Relative %: 55.8 % (ref 43.0–77.0)
PLATELETS: 312 10*3/uL (ref 150.0–400.0)
RBC: 3.79 Mil/uL — ABNORMAL LOW (ref 4.22–5.81)
RDW: 13.7 % (ref 11.5–15.5)
WBC: 7.7 10*3/uL (ref 4.0–10.5)

## 2015-09-23 LAB — HEPATIC FUNCTION PANEL
ALT: 30 U/L (ref 0–53)
AST: 26 U/L (ref 0–37)
Albumin: 4.5 g/dL (ref 3.5–5.2)
Alkaline Phosphatase: 61 U/L (ref 39–117)
BILIRUBIN DIRECT: 0.2 mg/dL (ref 0.0–0.3)
BILIRUBIN TOTAL: 0.6 mg/dL (ref 0.2–1.2)
Total Protein: 7.4 g/dL (ref 6.0–8.3)

## 2015-09-23 LAB — BASIC METABOLIC PANEL
BUN: 15 mg/dL (ref 6–23)
CALCIUM: 9.9 mg/dL (ref 8.4–10.5)
CHLORIDE: 109 meq/L (ref 96–112)
CO2: 28 mEq/L (ref 19–32)
CREATININE: 1.02 mg/dL (ref 0.40–1.50)
GFR: 94.03 mL/min (ref >60.00)
Glucose, Bld: 88 mg/dL (ref 70–99)
Potassium: 3.9 mEq/L (ref 3.5–5.1)
Sodium: 144 mEq/L (ref 135–145)

## 2015-09-23 LAB — PSA: PSA: 0.07 ng/mL — AB (ref 0.10–4.00)

## 2015-09-23 LAB — URINALYSIS, ROUTINE W REFLEX MICROSCOPIC
BILIRUBIN URINE: NEGATIVE
Hgb urine dipstick: NEGATIVE
Leukocytes, UA: NEGATIVE
Nitrite: NEGATIVE
Urine Glucose: NEGATIVE
Urobilinogen, UA: 0.2 (ref 0.0–1.0)
pH: 5.5 (ref 5.0–8.0)

## 2015-09-23 LAB — TSH: TSH: 1.82 u[IU]/mL (ref 0.35–4.50)

## 2015-09-23 LAB — SEDIMENTATION RATE: Sed Rate: 20 mm/hr (ref 0–20)

## 2015-09-23 LAB — HEMOGLOBIN A1C: Hgb A1c MFr Bld: 6 % (ref 4.6–6.5)

## 2015-09-23 NOTE — Patient Instructions (Addendum)
You had the new Prevnar pneumonia shot today, and the B12 shot  Please continue all other medications as before, and refills have been done if requested.  Please have the pharmacy call with any other refills you may need.  Please continue your efforts at being more active, low cholesterol diet, and weight control.  You are otherwise up to date with prevention measures today.  Please keep your appointments with your specialists as you may have planned  Please go to the LAB in the Basement (turn left off the elevator) for the tests to be done today  You will be contacted by phone if any changes need to be made immediately.  Otherwise, you will receive a letter about your results with an explanation, but please check with MyChart first.  Please remember to sign up for MyChart if you have not done so, as this will be important to you in the future with finding out test results, communicating by private email, and scheduling acute appointments online when needed.  Please return in 6 months, or sooner if needed

## 2015-09-23 NOTE — Progress Notes (Signed)
Subjective:    Patient ID: Nathan Wallace, male    DOB: [birth year], 65 y.o.   MRN: [id number]  HPI  Here for wellness and f/u;  Overall doing ok;  Pt denies Chest pain, worsening SOB, DOE, wheezing, orthopnea, PND, worsening LE edema, palpitations, dizziness or syncope.  Pt denies neurological change such as new headache, facial or extremity weakness.  Pt denies polydipsia, polyuria, or low sugar symptoms. Pt states overall good compliance with treatment and medications, good tolerability, and has been trying to follow appropriate diet.  Pt denies worsening depressive symptoms, suicidal ideation or panic. No fever, night sweats, wt loss, loss of appetite, or other constitutional symptoms.  Pt states good ability with ADL's, has low fall risk, home safety reviewed and adequate, no other significant changes in hearing or vision, and only occasionally active with exercise, though wt has been stable.   Wt Readings from Last 3 Encounters:  09/23/15 159 lb (72.122 kg)  08/31/15 161 lb (73.029 kg)  07/20/15 157 lb (71.215 kg)  Has been seeing Dr Tamala Julian recently in this office for shoulders and neck pain.  Has been disabled since 1990 after cspine fusion, but right shoulder pain worse in last few yrs.  Has also seen Dr Poole/NS several yrs ago.  Has been holding on taking only very occas zanaflex due to some sedation. Today has discomfort and puffiness of bilat MCP's symetriccally.  Due for b12 and prevnar pna shots today Past Medical History  Diagnosis Date  . Prostate cancer ([place]) 2011  . Hyperlipidemia     border line  . GERD (gastroesophageal reflux disease)     NO RECENT PROBLEMS AND NO MEDS  . Arthritis     RT KNEE AND RT SHOULDER  . Impaired glucose tolerance 06/22/2011  . Degenerative joint disease of knee, right   . Benign head tremor   . Left inguinal hernia   . CVA (cerebrovascular accident due to intracerebral hemorrhage) ([place]) 08/03/2011  . MGUS (monoclonal gammopathy of unknown  significance)   . Syncope 2012    after prostate surgery; neg neurology work up.   . Depression 07/26/2012  . Stroke [facility])     2013   Past Surgical History  Procedure Laterality Date  . Spine surgery  2003 OR 2004    CERVICAL FUSION-STATES LIMITED ROM NECK  . Wrist surgery    . Inguinal hernia repair  06/01/2011    Procedure: HERNIA REPAIR INGUINAL ADULT;  Surgeon: Adin Hector, MD;  Location: [facility];  Service: General;  Laterality: Left;  Left Inguinal Hernia Repair with Mesh  . Robot assisted laparoscopic radical prostatectomy  jan 2012    da vinci  . Colonoscopy      reports that he quit smoking about 49 years ago. He has never used smokeless tobacco. He reports that he drinks about 3.0 oz of alcohol per week. He reports that he does not use illicit drugs. family history includes Aneurysm in his mother; Cancer in his father; Colon cancer in his father; Heart attack in his brother; Hypertension in his mother. No Known Allergies Current Outpatient Prescriptions on File Prior to Visit  Medication Sig Dispense Refill  . allopurinol (ZYLOPRIM) 100 MG tablet TAKE 2 TABS BY MOUTH DAILY. 180 tablet 3  . amLODipine (NORVASC) 5 MG tablet Take 1 tablet by mouth  daily 90 tablet 1  . aspirin 81 MG chewable tablet Chew 81 mg by mouth daily.    Marland Kitchen atorvastatin (LIPITOR) 40 MG tablet Take  1 tablet by mouth  daily 90 tablet 0  . gabapentin (NEURONTIN) 300 MG capsule Take 2 capsules (600 mg total) by mouth 3 (three) times daily. 270 capsule 3  . irbesartan (AVAPRO) 300 MG tablet Take 1 tablet by mouth at  bedtime 90 tablet 0  . PRESCRIPTION MEDICATION Matrex injection at home every other day    . tiZANidine (ZANAFLEX) 4 MG tablet Take 1 tablet by mouth 3  times daily as needed for  muscle spasm(s) 270 tablet 1  . traMADol (ULTRAM) 50 MG tablet Take 1 tablet (50 mg total) by mouth 2 (two) times daily as needed. 180 tablet 1   No current facility-administered medications on file prior to visit.     Review of Systems Constitutional: Negative for increased diaphoresis, or other activity, appetite or siginficant weight change other than noted HENT: Negative for worsening hearing loss, ear pain, facial swelling, mouth sores and neck stiffness.   Eyes: Negative for other worsening pain, redness or visual disturbance.  Respiratory: Negative for choking or stridor Cardiovascular: Negative for other chest pain and palpitations.  Gastrointestinal: Negative for worsening diarrhea, blood in stool, or abdominal distention Genitourinary: Negative for hematuria, flank pain or change in urine volume.  Musculoskeletal: Negative for myalgias or other joint complaints.  Skin: Negative for other color change and wound or drainage.  Neurological: Negative for syncope and numbness. other than noted Hematological: Negative for adenopathy. or other swelling Psychiatric/Behavioral: Negative for hallucinations, SI, self-injury, decreased concentration or other worsening agitation.      Objective:   Physical Exam BP 122/78 mmHg  Pulse 74  Temp(Src) 98.4 F (36.9 C) (Oral)  Resp 20  Wt 159 lb (72.122 kg)  SpO2 93% VS noted,  Constitutional: Pt is oriented to person, place, and time. Appears well-developed and well-nourished, in no significant distress Head: Normocephalic and atraumatic  Eyes: Conjunctivae and EOM are normal. Pupils are equal, round, and reactive to light Right Ear: External ear normal.  Left Ear: External ear normal Nose: Nose normal.  Mouth/Throat: Oropharynx is clear and moist  Neck: Normal range of motion. Neck supple. No JVD present. No tracheal deviation present or significant neck LA or mass Cardiovascular: Normal rate, regular rhythm, normal heart sounds and intact distal pulses.   Pulmonary/Chest: Effort normal and breath sounds without rales or wheezing  Abdominal: Soft. Bowel sounds are normal. NT. No HSM  Musculoskeletal: Normal range of motion. Exhibits no edema, but  has bilat MCP swelling Lymphadenopathy: Has no cervical adenopathy.  Neurological: Pt is alert and oriented to person, place, and time. Pt has normal reflexes. No cranial nerve deficit. Motor grossly intact Skin: Skin is warm and dry. No rash noted or new ulcers Psychiatric:  Has normal mood and affect. Behavior is normal.  Left wrist and right knee trace effusions Bilat symmetric first and second MCp effusions as well, but not warm    Assessment & Plan:

## 2015-09-23 NOTE — Progress Notes (Signed)
Pre visit review using our clinic review tool, if applicable. No additional management support is needed unless otherwise documented below in the visit note. 

## 2015-09-24 LAB — HEPATITIS C ANTIBODY: HCV Ab: NEGATIVE

## 2015-09-24 LAB — CYCLIC CITRUL PEPTIDE ANTIBODY, IGG: Cyclic Citrullin Peptide Ab: <16 Units

## 2015-09-24 LAB — RHEUMATOID FACTOR

## 2015-09-25 LAB — ANA: ANA: NEGATIVE

## 2015-09-27 NOTE — Assessment & Plan Note (Signed)
stable overall by history and exam, recent data reviewed with pt, and pt to continue medical treatment as before,  to f/u any worsening symptoms or concerns Lab Results  Component Value Date   HGBA1C 6.0 09/23/2015

## 2015-09-27 NOTE — Assessment & Plan Note (Signed)
stable overall by history and exam, recent data reviewed with pt, and pt to continue medical treatment as before,  to f/u any worsening symptoms or concerns Lab Results  Component Value Date   LDLCALC 63 09/23/2015

## 2015-09-27 NOTE — Assessment & Plan Note (Signed)
stable overall by history and exam, recent data reviewed with pt, and pt to continue medical treatment as before,  to f/u any worsening symptoms or concerns BP Readings from Last 3 Encounters:  09/23/15 122/78  08/31/15 122/72  07/20/15 104/70

## 2015-09-27 NOTE — Assessment & Plan Note (Addendum)
Suspect DJD but will need r/o other with labs as ordered today to r/o inflammatory etiology, hold on imaging for now  In addition to the time spent performing CPE, I spent an additional 15 minutes face to face,in which greater than 50% of this time was spent in counseling and coordination of care for patient's illness as documented.

## 2015-09-27 NOTE — Assessment & Plan Note (Signed)

## 2015-10-05 ENCOUNTER — Other Ambulatory Visit: Payer: Self-pay | Admitting: Internal Medicine

## 2015-10-07 ENCOUNTER — Other Ambulatory Visit: Payer: Self-pay | Admitting: Family Medicine

## 2015-10-26 ENCOUNTER — Ambulatory Visit (INDEPENDENT_AMBULATORY_CARE_PROVIDER_SITE_OTHER): Payer: Medicare Other

## 2015-10-26 DIAGNOSIS — E538 Deficiency of other specified B group vitamins: Secondary | ICD-10-CM

## 2015-10-26 MED ORDER — CYANOCOBALAMIN 1000 MCG/ML IJ SOLN
1000.0000 ug | Freq: Once | INTRAMUSCULAR | Status: AC
  Administered 2015-10-26: 1000 ug via INTRAMUSCULAR

## 2015-10-30 ENCOUNTER — Ambulatory Visit: Payer: Medicare Other | Admitting: Family Medicine

## 2015-11-02 NOTE — Progress Notes (Signed)
[facility] [address], [place] [address] Phone: [phone] Subjective:    I'm seeing this patient by the request  of:  Cathlean Cower, MD   CC: left shoulder pain and neck pain f/u  Bilateral wrist pain  [id number]  Nathan Wallace is a 65 y.o. male coming in with complaint of left shoulder pain and neck pain follow up. Of note patient does have a past medical history significant for C4 through C7 fusion. He was started on gabapentin at last visit and titrated up to 600 mg 3 times a day. Patient states that it helps with the radiation pain. Still has the chronic dull aching sensation. Patient states he is also had some exacerbation with his wrists. Patient denies any weakness. States that when he does a lot of activity outside or drinks alcoholic is very large and read. Only taking 100 mg in the upper in all. Does think it could be gout but also some potential arthritis. States that when he wears the brace can do some icing seems to do better. Patient states that then when he tries to increase his activity again or once again drinks or call seems to get worse.  Previous x-rays in April 2015 showed fusion of C4-C7 and moderate severe degenerative disc disease at C6, C7-T1. Moderate arthritic changes at C2-C3 as well.  Past Medical History:  Diagnosis Date  . Arthritis    RT KNEE AND RT SHOULDER  . Benign head tremor   . CVA (cerebrovascular accident due to intracerebral hemorrhage) ([place]) 08/03/2011  . Degenerative joint disease of knee, right   . Depression 07/26/2012  . GERD (gastroesophageal reflux disease)    NO RECENT PROBLEMS AND NO MEDS  . Hyperlipidemia    border line  . Impaired glucose tolerance 06/22/2011  . Left inguinal hernia   . MGUS (monoclonal gammopathy of unknown significance)   . Prostate cancer ([place]) 2011  . Stroke [facility])    2013  . Syncope 2012   after prostate surgery; neg neurology work up.    Past Surgical History:    Procedure Laterality Date  . COLONOSCOPY    . INGUINAL HERNIA REPAIR  06/01/2011   Procedure: HERNIA REPAIR INGUINAL ADULT;  Surgeon: Adin Hector, MD;  Location: [facility];  Service: General;  Laterality: Left;  Left Inguinal Hernia Repair with Mesh  . ROBOT ASSISTED LAPAROSCOPIC RADICAL PROSTATECTOMY  jan 2012   da vinci  . SPINE SURGERY  2003 OR 2004   CERVICAL FUSION-STATES LIMITED ROM NECK  . WRIST SURGERY     Social History  Substance Use Topics  . Smoking status: Former Smoker    Quit date: 06/28/1966  . Smokeless tobacco: Never Used     Comment: QUIT SMOKING IN THE 70'S  . Alcohol use 3.0 oz/week    5 Cans of beer per week     Comment: 5th a liquor a week beer and vodka   No Known Allergies Family History  Problem Relation Age of Onset  . Hypertension Mother   . Aneurysm Mother     brain  . Cancer Father     colon  . Colon cancer Father   . Heart attack Brother         Past medical history, social, surgical and family history all reviewed in electronic medical record.   Review of Systems: No headache, visual changes, nausea, vomiting, diarrhea, constipation, dizziness, abdominal pain, skin rash, fevers, chills, night sweats, weight loss,  swollen lymph nodes, body aches, joint swelling, muscle aches, chest pain, shortness of breath, mood changes.   Objective  Blood pressure 104/70, pulse 63, weight 157 lb (71.2 kg), SpO2 99 %.  General: No apparent distress alert and oriented x3 mood and affect normal, dressed appropriately.  HEENT: Pupils equal, extraocular movements intact  Respiratory: Patient's speak in full sentences and does not appear short of breath  Cardiovascular: No lower extremity edema, non tender, no erythema  Skin: Warm dry intact with no signs of infection or rash on extremities or on axial skeleton.  Abdomen: Soft nontender  Neuro: Cranial nerves II through XII are intact, neurovascularly intact in all extremities with 2+ DTRs and 2+ pulses.   Lymph: No lymphadenopathy of posterior or anterior cervical chain or axillae bilaterally.  Gait normal with good balance and coordination.  MSK:  Non tender with full range of motion and good stability and symmetric strength and tone of  elbows,  hip, knee and ankles bilaterally. Osteoarthritic changes of multiple joints   Wrist:left Mild swelling on the dorsal aspect of the left wrist ROM smooth and normal with good flexion and extension and ulnar/radial deviation that is symmetrical with opposite wrist. Diffuse tenderness of the wrist No snuffbox tenderness. No tenderness over Canal of [place]. Strength 5/5 in all directions without pain. Negative Finkelstein, tinel's and phalens. Negative Watson's test. Hunter lateral risk unremarkable  Neck: Inspection shows loss of lordosis No palpable stepoffs. Positive Spurling's maneuver noted.rib less sensitive Limited range of motion in flexion-extension as well as side bending secondary to fusion Grip strength and sensation normal in bilateral hands Strength good C4 to T1 distribution, with continued weakness of the T1 distribution compared to the contralateral side No sensory change to C4 to T1 Negative Hoffman sign bilaterally Reflexes normal Shoulder:bilateral Atrophy of the shoulder girdle musculature bilaterally. Palpation is normal with no tenderness over AC joint or bicipital groove. ROM is full in all planes.mild crepitus with range of motion Rotator cuff strength 4/5 but symmetric No signs of impingement with negative Neer and Hawkin's tests, empty can sign. Speeds and Yergason's tests normal. positive painful arc bilaterally No apprehension sign Mild worsening from previous exam     Impression and Recommendations:     This case required medical decision making of moderate complexity.

## 2015-11-03 ENCOUNTER — Encounter: Payer: Self-pay | Admitting: Family Medicine

## 2015-11-03 ENCOUNTER — Ambulatory Visit (INDEPENDENT_AMBULATORY_CARE_PROVIDER_SITE_OTHER): Payer: Medicare Other | Admitting: Family Medicine

## 2015-11-03 DIAGNOSIS — M5010 Cervical disc disorder with radiculopathy, unspecified cervical region: Secondary | ICD-10-CM | POA: Diagnosis not present

## 2015-11-03 DIAGNOSIS — M109 Gout, unspecified: Secondary | ICD-10-CM | POA: Diagnosis not present

## 2015-11-03 DIAGNOSIS — D472 Monoclonal gammopathy: Secondary | ICD-10-CM | POA: Diagnosis not present

## 2015-11-03 MED ORDER — COLCHICINE 0.6 MG PO TABS: 0.6000 mg | ORAL_TABLET | Freq: Two times a day (BID) | ORAL | 2 refills | Status: DC

## 2015-11-03 MED ORDER — ALLOPURINOL 300 MG PO TABS: 300.0000 mg | ORAL_TABLET | Freq: Every day | ORAL | 1 refills | Status: DC

## 2015-11-03 NOTE — Assessment & Plan Note (Signed)
Discussed with him that the worsening pain in the swelling can be secondary to this and possibly labs and x-rays will be needed. Patient declined at this moment. She'll come back and see me again in 4-6 weeks we'll consider further workup.

## 2015-11-03 NOTE — Patient Instructions (Addendum)
Good to see you  Allopurinol now at 300mg  daily  Colchicine 2 times a day for 5 days if flare. Do not take the lipitor when you take this.  Ice is your friend  Wear brace when you need it.  Stay hydrated.  See me again in 4 weeks and we will see if you ned a brace on your other hand.

## 2015-11-03 NOTE — Progress Notes (Signed)
Pre visit review using our clinic review tool, if applicable. No additional management support is needed unless otherwise documented below in the visit note. 

## 2015-11-03 NOTE — Assessment & Plan Note (Signed)
Concerned the patient has worsening symptoms. Has only been taking 100 mg of the peroneal. We'll increase in 300 mg. Patient also given up her prescription of colchicine that if he has any breakthrough and should take it. Warned of potential side effects. Patient will stop taking as when he does take colchicine. Discussed certain foods choices and staying hydrated. Follow-up again in 4-6 weeks.

## 2015-11-03 NOTE — Assessment & Plan Note (Signed)
Continues to give him difficulty. Gabapentin is at a very high dose and I do not feel comfortable increasing. Patient has declined new x-rays or formal physical therapy at this point. He would not want surgical intervention at this time. Also would not entertain the possibility of epidural's at the moment but may in the future. Patient will continue the conservative therapy otherwise. Does have terminal for breakthrough pain. Patient come back and see me again in 4-6 weeks.

## 2015-11-12 ENCOUNTER — Encounter: Payer: Self-pay | Admitting: Family Medicine

## 2015-11-12 ENCOUNTER — Telehealth: Payer: Self-pay | Admitting: Family Medicine

## 2015-11-12 NOTE — Telephone Encounter (Signed)
Discussed with pt

## 2015-11-12 NOTE — Telephone Encounter (Signed)
Pt stated insurance is not cover for colchicine 0.6 MG tablet , he was hopping Dr. Tamala Julian can do something to help or change to something cheaper. Please call him back

## 2015-11-12 NOTE — Telephone Encounter (Signed)
I am sorry but there is not  We could try uloric but likely too expensive as well.  We will hope the allopurinol alone helps.

## 2015-11-13 DIAGNOSIS — N393 Stress incontinence (female) (male): Secondary | ICD-10-CM | POA: Diagnosis not present

## 2015-11-13 DIAGNOSIS — R350 Frequency of micturition: Secondary | ICD-10-CM | POA: Diagnosis not present

## 2015-11-24 ENCOUNTER — Ambulatory Visit (INDEPENDENT_AMBULATORY_CARE_PROVIDER_SITE_OTHER): Payer: Medicare Other | Admitting: General Practice

## 2015-11-24 DIAGNOSIS — E538 Deficiency of other specified B group vitamins: Secondary | ICD-10-CM

## 2015-11-24 MED ORDER — CYANOCOBALAMIN 1000 MCG/ML IJ SOLN
1000.0000 ug | Freq: Once | INTRAMUSCULAR | Status: AC
  Administered 2015-11-24: 1000 ug via INTRAMUSCULAR

## 2015-11-25 NOTE — Progress Notes (Signed)
[name] [place], [place] [address] Phone: [phone] Subjective:    I'm seeing this patient by the request  of:  Cathlean Cower, MD   CC: left shoulder pain and neck pain f/u  Bilateral wrist pain  [id number]  Nathan Wallace is a 65 y.o. male coming in with complaint of left shoulder pain and neck pain follow up. Of note patient does have a past medical history significant for C4 through C7 fusion. He was started on gabapentin at last visit and titrated up to 600 mg 3 times a day. Patient states that it helps with the radiation pain. Still has the chronic dull aching sensation.  Patient states he is also had some exacerbation with his wrists. Patient was given a brace for the left wrist and states that this has helped out significantly. Unfortunate having worsening pain on the right wrist. Patient states that as long as he would have a brace he thinks he was doing relatively well. Has had surgery on that side before for a ganglion cyst as well as carpal tunnel. Patient feels like there could be an association with the neck but does not remember exactly  Patient states that he is having more chronic aches and pains all over. Seems to be as knees, thighs, lower back as well.  Previous x-rays in April 2015 showed fusion of C4-C7 and moderate severe degenerative disc disease at C6, C7-T1. Moderate arthritic changes at C2-C3 as well.  Past Medical History:  Diagnosis Date  . Arthritis    RT KNEE AND RT SHOULDER  . Benign head tremor   . CVA (cerebrovascular accident due to intracerebral hemorrhage) ([place]) 08/03/2011  . Degenerative joint disease of knee, right   . Depression 07/26/2012  . GERD (gastroesophageal reflux disease)    NO RECENT PROBLEMS AND NO MEDS  . Hyperlipidemia    border line  . Impaired glucose tolerance 06/22/2011  . Left inguinal hernia   . MGUS (monoclonal gammopathy of unknown significance)   . Prostate cancer ([place] [facility]) 2011    . Stroke [facility])    2013  . Syncope 2012   after prostate surgery; neg neurology work up.    Past Surgical History:  Procedure Laterality Date  . COLONOSCOPY    . INGUINAL HERNIA REPAIR  06/01/2011   Procedure: HERNIA REPAIR INGUINAL ADULT;  Surgeon: Adin Hector, MD;  Location: [facility];  Service: General;  Laterality: Left;  Left Inguinal Hernia Repair with Mesh  . ROBOT ASSISTED LAPAROSCOPIC RADICAL PROSTATECTOMY  jan 2012   da vinci  . SPINE SURGERY  2003 OR 2004   CERVICAL FUSION-STATES LIMITED ROM NECK  . WRIST SURGERY     Social History  Substance Use Topics  . Smoking status: Former Smoker    Quit date: 06/28/1966  . Smokeless tobacco: Never Used     Comment: QUIT SMOKING IN THE 70'S  . Alcohol use 3.0 oz/week    5 Cans of beer per week     Comment: 5th a liquor a week beer and vodka   No Known Allergies Family History  Problem Relation Age of Onset  . Hypertension Mother   . Aneurysm Mother     brain  . Cancer Father     colon  . Colon cancer Father   . Heart attack Brother         Past medical history, social, surgical and family history all reviewed in electronic medical record.   Review  of Systems: No headache, visual changes, nausea, vomiting, diarrhea, constipation, dizziness, abdominal pain, skin rash, fevers, chills, night sweats, weight loss, swollen lymph nodes, body aches, joint swelling, muscle aches, chest pain, shortness of breath, mood changes.   Objective  Blood pressure 112/70, pulse (!) 58, weight 158 lb (71.7 kg), SpO2 99 %.  General: No apparent distress alert and oriented x3 mood and affect normal, dressed appropriately.  HEENT: Pupils equal, extraocular movements intact  Respiratory: Patient's speak in full sentences and does not appear short of breath  Cardiovascular: No lower extremity edema, non tender, no erythema  Skin: Warm dry intact with no signs of infection or rash on extremities or on axial skeleton.  Abdomen: Soft nontender   Neuro: Cranial nerves II through XII are intact, neurovascularly intact in all extremities with 2+ DTRs and 2+ pulses.  Lymph: No lymphadenopathy of posterior or anterior cervical chain or axillae bilaterally.  Gait normal with good balance and coordination.  MSK:  Non tender with full range of motion and good stability and symmetric strength and tone of  elbows,  hip, knee and ankles bilaterally. Osteoarthritic changes of multiple joints   Wrist:left Mild swelling on the dorsal aspect of the left wrist ROM smooth and normal with good flexion and extension and ulnar/radial deviation that is symmetrical with opposite wrist. Diffuse tenderness of the wrist No snuffbox tenderness. No tenderness over Canal of Gu[place]. Strength 5/5 in all directions without pain. Negative Finkelstein, tinel's and phalens. Negative Watson's test. Hunter lateral risk unremarkable  Neck: Inspection shows loss of lordosis No palpable stepoffs. Positive Spurling's maneuver noted. Bilaterally Limited range of motion in flexion-extension as well as right side bending secondary to fusion Grip strength and sensation normal in bilateral hands Strength good C4 to T1 distribution, with continued weakness of the T1 distribution compared to the contralateral side No sensory change to C4 to T1 Negative Hoffman sign bilaterally Reflexes normal  Shoulder:bilateral Atrophy of the shoulder girdle musculature bilaterally. Palpation is normal with no tenderness over AC joint or bicipital groove. ROM is full in all planes.mild crepitus with range of motion Rotator cuff strength 4/5 but symmetric signs of impingement with psitiveNeer and Hawkin's tests, empty can sign. Speeds and Yergason's tests normal. positive painful arc bilaterally No apprehension sign   Wrist had generalized tenderness with mild crepitus bilaterally. Grip strength is 4 out of 5 bilaterally. Negative de Quervain's sign with Wynn Maudlin. Negative  carpal tunnel signs with Tinel's.    Impression and Recommendations:     This case required medical decision making of moderate complexity.

## 2015-11-26 ENCOUNTER — Ambulatory Visit (INDEPENDENT_AMBULATORY_CARE_PROVIDER_SITE_OTHER): Payer: Medicare Other | Admitting: Family Medicine

## 2015-11-26 ENCOUNTER — Encounter: Payer: Self-pay | Admitting: Family Medicine

## 2015-11-26 VITALS — BP 112/70 | HR 58 | Wt 158.0 lb

## 2015-11-26 DIAGNOSIS — M25531 Pain in right wrist: Secondary | ICD-10-CM

## 2015-11-26 DIAGNOSIS — M2550 Pain in unspecified joint: Secondary | ICD-10-CM

## 2015-11-26 DIAGNOSIS — M5010 Cervical disc disorder with radiculopathy, unspecified cervical region: Secondary | ICD-10-CM

## 2015-11-26 DIAGNOSIS — M25532 Pain in left wrist: Secondary | ICD-10-CM | POA: Diagnosis not present

## 2015-11-26 NOTE — Assessment & Plan Note (Signed)
Discussed labs which patient declined.

## 2015-11-26 NOTE — Assessment & Plan Note (Addendum)
Discussed with patient at great length. We did get new x-rays with it being greater than 2 years since I have seen it. Patient is having worsening symptoms with likely some radicular symptoms going down the arms. Encourage him to continue the gabapentin as well as the other medications. We discussed the possibility of a statin plain a role in well half the dose but will not take them off at the history of a CVA. We discussed icing regimen. Patient will come back and see me again in 3-4 weeks. Spent  25 minutes with patient face-to-face and had greater than 50% of counseling including as described above in assessment and plan.

## 2015-11-26 NOTE — Patient Instructions (Signed)
Good to see you  We will get you a brace Ice is your friend Try to decrease the lipitor for 2 weeks at 1/2 the amount and see how you do.  We will get new xray for your neck  If your knees get worse come see me  Otherwise see me again in 6-8 weeks.

## 2015-12-17 ENCOUNTER — Ambulatory Visit
Admission: RE | Admit: 2015-12-17 | Discharge: 2015-12-17 | Disposition: A | Discharge status: Home / Self Care | Payer: Medicare Other | Source: Ambulatory Visit | Attending: Family Medicine | Admitting: Family Medicine

## 2015-12-17 DIAGNOSIS — M5010 Cervical disc disorder with radiculopathy, unspecified cervical region: Secondary | ICD-10-CM

## 2015-12-23 DIAGNOSIS — R300 Dysuria: Secondary | ICD-10-CM | POA: Diagnosis not present

## 2015-12-23 DIAGNOSIS — R350 Frequency of micturition: Secondary | ICD-10-CM | POA: Diagnosis not present

## 2015-12-25 ENCOUNTER — Ambulatory Visit (INDEPENDENT_AMBULATORY_CARE_PROVIDER_SITE_OTHER): Payer: Medicare Other

## 2015-12-25 DIAGNOSIS — Z23 Encounter for immunization: Secondary | ICD-10-CM | POA: Diagnosis not present

## 2015-12-25 DIAGNOSIS — E538 Deficiency of other specified B group vitamins: Secondary | ICD-10-CM

## 2015-12-25 MED ORDER — CYANOCOBALAMIN 1000 MCG/ML IJ SOLN
1000.0000 ug | Freq: Once | INTRAMUSCULAR | Status: AC
  Administered 2015-12-25: 1000 ug via INTRAMUSCULAR

## 2015-12-29 ENCOUNTER — Other Ambulatory Visit (HOSPITAL_BASED_OUTPATIENT_CLINIC_OR_DEPARTMENT_OTHER): Payer: Medicare Other

## 2015-12-29 ENCOUNTER — Other Ambulatory Visit: Payer: Self-pay | Admitting: Internal Medicine

## 2015-12-29 DIAGNOSIS — D472 Monoclonal gammopathy: Secondary | ICD-10-CM

## 2015-12-29 LAB — CBC WITH DIFFERENTIAL/PLATELET
BASO%: 0.4 % (ref 0.0–2.0)
Basophils Absolute: 0 10*3/uL (ref 0.0–0.1)
EOS ABS: 0.1 10*3/uL (ref 0.0–0.5)
EOS%: 1.6 % (ref 0.0–7.0)
HCT: 38.3 % — ABNORMAL LOW (ref 38.4–49.9)
HEMOGLOBIN: 12.8 g/dL — AB (ref 13.0–17.1)
LYMPH%: 37.8 % (ref 14.0–49.0)
MCH: 33.9 pg — ABNORMAL HIGH (ref 27.2–33.4)
MCHC: 33.5 g/dL (ref 32.0–36.0)
MCV: 101.2 fL — AB (ref 79.3–98.0)
MONO#: 0.4 10*3/uL (ref 0.1–0.9)
MONO%: 6.8 % (ref 0.0–14.0)
NEUT%: 53.4 % (ref 39.0–75.0)
NEUTROS ABS: 3.3 10*3/uL (ref 1.5–6.5)
Platelets: 344 10*3/uL (ref 140–400)
RBC: 3.78 10*6/uL — AB (ref 4.20–5.82)
RDW: 13.5 % (ref 11.0–14.6)
WBC: 6.2 10*3/uL (ref 4.0–10.3)
lymph#: 2.3 10*3/uL (ref 0.9–3.3)

## 2015-12-29 LAB — COMPREHENSIVE METABOLIC PANEL WITH GFR
ALT: 19 U/L (ref 0–55)
AST: 20 U/L (ref 5–34)
Albumin: 3.7 g/dL (ref 3.5–5.0)
Alkaline Phosphatase: 73 U/L (ref 40–150)
Anion Gap: 9 meq/L (ref 3–11)
BUN: 15 mg/dL (ref 7.0–26.0)
CO2: 22 meq/L (ref 22–29)
Calcium: 9.2 mg/dL (ref 8.4–10.4)
Chloride: 109 meq/L (ref 98–109)
Creatinine: 1.2 mg/dL (ref 0.7–1.3)
EGFR: 75 ml/min/1.73 m2 — ABNORMAL LOW (ref >90)
Glucose: 127 mg/dL (ref 70–140)
Potassium: 4.1 meq/L (ref 3.5–5.1)
Sodium: 140 meq/L (ref 136–145)
Total Bilirubin: 0.57 mg/dL (ref 0.20–1.20)
Total Protein: 7.8 g/dL (ref 6.4–8.3)

## 2015-12-30 LAB — KAPPA/LAMBDA LIGHT CHAINS
IG LAMBDA FREE LIGHT CHAIN: 14.7 mg/L (ref 5.7–26.3)
Ig Kappa Free Light Chain: 57.3 mg/L — ABNORMAL HIGH (ref 3.3–19.4)
Kappa/Lambda FluidC Ratio: 3.9 — ABNORMAL HIGH (ref 0.26–1.65)

## 2016-01-01 LAB — MULTIPLE MYELOMA PANEL, SERUM
Albumin SerPl Elph-Mcnc: 3.7 g/dL (ref 2.9–4.4)
Albumin/Glob SerPl: 1.1 (ref 0.7–1.7)
Alpha 1: 0.2 g/dL (ref 0.0–0.4)
Alpha2 Glob SerPl Elph-Mcnc: 0.6 g/dL (ref 0.4–1.0)
B-Globulin SerPl Elph-Mcnc: 1.1 g/dL (ref 0.7–1.3)
Gamma Glob SerPl Elph-Mcnc: 1.5 g/dL (ref 0.4–1.8)
Globulin, Total: 3.4 g/dL (ref 2.2–3.9)
IGM (IMMUNOGLOBIN M), SRM: 28 mg/dL (ref 20–172)
IgA, Qn, Serum: 279 mg/dL (ref 61–437)
M Protein SerPl Elph-Mcnc: 0.9 g/dL — ABNORMAL HIGH
TOTAL PROTEIN: 7.1 g/dL (ref 6.0–8.5)

## 2016-01-05 ENCOUNTER — Encounter: Payer: Self-pay | Admitting: Hematology and Oncology

## 2016-01-05 ENCOUNTER — Ambulatory Visit (HOSPITAL_BASED_OUTPATIENT_CLINIC_OR_DEPARTMENT_OTHER): Payer: Medicare Other | Admitting: Hematology and Oncology

## 2016-01-05 ENCOUNTER — Telehealth: Payer: Self-pay | Admitting: Hematology and Oncology

## 2016-01-05 VITALS — BP 111/70 | HR 56 | Temp 97.3°F | Resp 18 | Ht 71.5 in | Wt 155.0 lb

## 2016-01-05 DIAGNOSIS — D539 Nutritional anemia, unspecified: Secondary | ICD-10-CM

## 2016-01-05 DIAGNOSIS — E538 Deficiency of other specified B group vitamins: Secondary | ICD-10-CM | POA: Diagnosis not present

## 2016-01-05 DIAGNOSIS — D472 Monoclonal gammopathy: Secondary | ICD-10-CM | POA: Diagnosis not present

## 2016-01-05 DIAGNOSIS — Z8546 Personal history of malignant neoplasm of prostate: Secondary | ICD-10-CM | POA: Diagnosis not present

## 2016-01-05 DIAGNOSIS — D519 Vitamin B12 deficiency anemia, unspecified: Secondary | ICD-10-CM | POA: Insufficient documentation

## 2016-01-05 DIAGNOSIS — D518 Other vitamin B12 deficiency anemias: Secondary | ICD-10-CM | POA: Insufficient documentation

## 2016-01-05 NOTE — Telephone Encounter (Addendum)
Lvm advising appts for 12/29/16 + 01/05/17. Also mailed appts.

## 2016-01-05 NOTE — Progress Notes (Signed)
[facility] OFFICE PROGRESS NOTE  Patient Care Team: Biagio Borg, MD as PCP - General Clearnce Sorrel, MD as Attending Physician (Neurology) Earnie Larsson, MD (Neurosurgery) Heath Lark, MD as Consulting Physician (Hematology and Oncology)  SUMMARY OF ONCOLOGIC HISTORY: This is a pleasant gentleman who was diagnosed with monoclonal gammopathy of unknown significance in 2013. The patient has history of anemia. Subsequent evaluation found that the patient had B12 deficiency. He was placed on B12 replacement therapy. 24-hour urine collection x-ray and blood work did not show evidence of end organ damage and he is being observed. He also has history of prostate cancer status post resection. His follow closely with his urologist.  INTERVAL HISTORY: Please see below for problem oriented charting. He feels well. Denies recent infection. No new bone pain. He had recent urinary tract infection, successively treated with antibiotic therapy recently He is up-to-date with influenza vaccination  REVIEW OF SYSTEMS:   Constitutional: Denies fevers, chills or abnormal weight loss Eyes: Denies blurriness of vision Ears, nose, mouth, throat, and face: Denies mucositis or sore throat Respiratory: Denies cough, dyspnea or wheezes Cardiovascular: Denies palpitation, chest discomfort or lower extremity swelling Gastrointestinal:  Denies nausea, heartburn or change in bowel habits Skin: Denies abnormal skin rashes Lymphatics: Denies new lymphadenopathy or easy bruising Neurological:Denies numbness, tingling or new weaknesses Behavioral/Psych: Mood is stable, no new changes  All other systems were reviewed with the patient and are negative.  I have reviewed the past medical history, past surgical history, social history and family history with the patient and they are unchanged from previous note.  ALLERGIES:  has No Known Allergies.  MEDICATIONS:  Current Outpatient Prescriptions  Medication  Sig Dispense Refill  . allopurinol (ZYLOPRIM) 300 MG tablet Take 1 tablet (300 mg total) by mouth daily. 90 tablet 1  . amLODipine (NORVASC) 5 MG tablet Take 1 tablet by mouth  daily 90 tablet 1  . aspirin 81 MG chewable tablet Chew 81 mg by mouth daily.    Marland Kitchen atorvastatin (LIPITOR) 40 MG tablet Take 1 tablet by mouth  daily 90 tablet 0  . colchicine 0.6 MG tablet Take 1 tablet (0.6 mg total) by mouth 2 (two) times daily. For 5 days when having flare. 60 tablet 2  . gabapentin (NEURONTIN) 300 MG capsule Take 2 capsules (600 mg total) by mouth 3 (three) times daily. 270 capsule 3  . irbesartan (AVAPRO) 300 MG tablet Take 1 tablet by mouth at  bedtime 90 tablet 0  . PRESCRIPTION MEDICATION Matrex injection at home every other day    . tiZANidine (ZANAFLEX) 4 MG tablet Take 1 tablet by mouth 3  times daily as needed for  muscle spasm(s) 270 tablet 1  . traMADol (ULTRAM) 50 MG tablet Take 1 tablet (50 mg total) by mouth 2 (two) times daily as needed. 180 tablet 1   No current facility-administered medications for this visit.     PHYSICAL EXAMINATION: ECOG PERFORMANCE STATUS: 0 - Asymptomatic  Vitals:   01/05/16 1004  BP: 111/70  Pulse: (!) 56  Resp: 18  Temp: 97.3 F (36.3 C)   Filed Weights   01/05/16 1004  Weight: 155 lb (70.3 kg)    GENERAL:alert, no distress and comfortable SKIN: skin color, texture, turgor are normal, no rashes or significant lesions EYES: normal, Conjunctiva are pink and non-injected, sclera clear OROPHARYNX:no exudate, no erythema and lips, buccal mucosa, and tongue normal  NECK: supple, thyroid normal size, non-tender, without nodularity LYMPH:  no  palpable lymphadenopathy in the cervical, axillary or inguinal LUNGS: clear to auscultation and percussion with normal breathing effort HEART: regular rate & rhythm and no murmurs and no lower extremity edema ABDOMEN:abdomen soft, non-tender and normal bowel sounds Musculoskeletal:no cyanosis of digits and no  clubbing  NEURO: alert & oriented x 3 with fluent speech, no focal motor/sensory deficits  LABORATORY DATA:  I have reviewed the data as listed    Component Value Date/Time   NA 140 12/29/2015 0954   K 4.1 12/29/2015 0954   CL 109 09/23/2015 1421   CL 104 06/28/2012 1010   CO2 22 12/29/2015 0954   GLUCOSE 127 12/29/2015 0954   GLUCOSE 127 (H) 06/28/2012 1010   BUN 15.0 12/29/2015 0954   CREATININE 1.2 12/29/2015 0954   CALCIUM 9.2 12/29/2015 0954   PROT 7.1 12/29/2015 0954   PROT 7.8 12/29/2015 0954   ALBUMIN 3.7 12/29/2015 0954   AST 20 12/29/2015 0954   ALT 19 12/29/2015 0954   ALKPHOS 73 12/29/2015 0954   BILITOT 0.57 12/29/2015 0954   GFRNONAA 76 (L) 11/03/2011 1645   GFRAA 88 (L) 11/03/2011 1645    No results found for: SPEP, UPEP  Lab Results  Component Value Date   WBC 6.2 12/29/2015   NEUTROABS 3.3 12/29/2015   HGB 12.8 (L) 12/29/2015   HCT 38.3 (L) 12/29/2015   MCV 101.2 (H) 12/29/2015   PLT 344 12/29/2015      Chemistry      Component Value Date/Time   NA 140 12/29/2015 0954   K 4.1 12/29/2015 0954   CL 109 09/23/2015 1421   CL 104 06/28/2012 1010   CO2 22 12/29/2015 0954   BUN 15.0 12/29/2015 0954   CREATININE 1.2 12/29/2015 0954      Component Value Date/Time   CALCIUM 9.2 12/29/2015 0954   ALKPHOS 73 12/29/2015 0954   AST 20 12/29/2015 0954   ALT 19 12/29/2015 0954   BILITOT 0.57 12/29/2015 0954      ASSESSMENT & PLAN:  MGUS (monoclonal gammopathy of unknown significance) Clinically, he has no signs of disease progression. Plan to see him next year in with repeat blood work, examination and blood work.  B12 deficiency The patient is receiving vitamin B 12 injection through his primary care office  History of prostate cancer He continues close follow-up with urologist. Clinically, he has no signs and symptoms of recurrence of prostate cancer.  Macrocytic anemia with vitamin B12 deficiency He has chronic macrocytic anemia. He is  receiving vitamin B-12 injection. I suspect that could be related to alcohol intake. The patient denies excessive alcohol ingestion. He is not symptomatic and I recommend observation only.   Orders Placed This Encounter  Procedures  . CBC with Differential/Platelet    Standing Status:   Future    Standing Expiration Date:   02/08/2017  . Comprehensive metabolic panel    Standing Status:   Future    Standing Expiration Date:   02/08/2017  . Kappa/lambda light chains    Standing Status:   Future    Standing Expiration Date:   02/08/2017  . Multiple Myeloma Panel (SPEP&IFE w/QIG)    Standing Status:   Future    Standing Expiration Date:   02/08/2017   All questions were answered. The patient knows to call the clinic with any problems, questions or concerns. No barriers to learning was detected. I spent 15 minutes counseling the patient face to face. The total time spent in the appointment was 20 minutes  and more than 50% was on counseling and review of test results     Heath Lark, MD 01/05/2016 10:21 AM

## 2016-01-05 NOTE — Assessment & Plan Note (Signed)
The patient is receiving vitamin B 12 injection through his primary care office

## 2016-01-05 NOTE — Assessment & Plan Note (Signed)
He continues close follow-up with urologist. Clinically, he has no signs and symptoms of recurrence of prostate cancer.

## 2016-01-05 NOTE — Assessment & Plan Note (Signed)
Clinically, he has no signs of disease progression. Plan to see him next year in with repeat blood work, examination and blood work. 

## 2016-01-05 NOTE — Assessment & Plan Note (Signed)
He has chronic macrocytic anemia. He is receiving vitamin B-12 injection. I suspect that could be related to alcohol intake. The patient denies excessive alcohol ingestion. He is not symptomatic and I recommend observation only.

## 2016-01-06 ENCOUNTER — Observation Stay (HOSPITAL_COMMUNITY): Payer: Medicare Other

## 2016-01-06 ENCOUNTER — Emergency Department (HOSPITAL_COMMUNITY): Payer: Medicare Other

## 2016-01-06 ENCOUNTER — Observation Stay (HOSPITAL_COMMUNITY)
Admission: EM | Admit: 2016-01-06 | Discharge: 2016-01-07 | Disposition: A | Discharge status: Home / Self Care | Payer: Medicare Other | Attending: Internal Medicine | Admitting: Internal Medicine

## 2016-01-06 ENCOUNTER — Encounter (HOSPITAL_COMMUNITY): Payer: Self-pay | Admitting: Emergency Medicine

## 2016-01-06 DIAGNOSIS — N179 Acute kidney failure, unspecified: Secondary | ICD-10-CM | POA: Diagnosis not present

## 2016-01-06 DIAGNOSIS — Z79899 Other long term (current) drug therapy: Secondary | ICD-10-CM | POA: Insufficient documentation

## 2016-01-06 DIAGNOSIS — K219 Gastro-esophageal reflux disease without esophagitis: Secondary | ICD-10-CM | POA: Insufficient documentation

## 2016-01-06 DIAGNOSIS — E785 Hyperlipidemia, unspecified: Secondary | ICD-10-CM | POA: Insufficient documentation

## 2016-01-06 DIAGNOSIS — Z87891 Personal history of nicotine dependence: Secondary | ICD-10-CM | POA: Diagnosis not present

## 2016-01-06 DIAGNOSIS — R079 Chest pain, unspecified: Secondary | ICD-10-CM | POA: Diagnosis not present

## 2016-01-06 DIAGNOSIS — A419 Sepsis, unspecified organism: Secondary | ICD-10-CM | POA: Diagnosis present

## 2016-01-06 DIAGNOSIS — R252 Cramp and spasm: Secondary | ICD-10-CM | POA: Diagnosis not present

## 2016-01-06 DIAGNOSIS — Z7982 Long term (current) use of aspirin: Secondary | ICD-10-CM | POA: Diagnosis not present

## 2016-01-06 DIAGNOSIS — I10 Essential (primary) hypertension: Secondary | ICD-10-CM | POA: Diagnosis not present

## 2016-01-06 DIAGNOSIS — E872 Acidosis, unspecified: Secondary | ICD-10-CM | POA: Diagnosis present

## 2016-01-06 DIAGNOSIS — I2510 Atherosclerotic heart disease of native coronary artery without angina pectoris: Secondary | ICD-10-CM | POA: Diagnosis not present

## 2016-01-06 DIAGNOSIS — D472 Monoclonal gammopathy: Secondary | ICD-10-CM | POA: Diagnosis not present

## 2016-01-06 DIAGNOSIS — D539 Nutritional anemia, unspecified: Secondary | ICD-10-CM | POA: Insufficient documentation

## 2016-01-06 DIAGNOSIS — E538 Deficiency of other specified B group vitamins: Secondary | ICD-10-CM | POA: Diagnosis not present

## 2016-01-06 DIAGNOSIS — I959 Hypotension, unspecified: Secondary | ICD-10-CM | POA: Diagnosis present

## 2016-01-06 DIAGNOSIS — Z8546 Personal history of malignant neoplasm of prostate: Secondary | ICD-10-CM | POA: Diagnosis not present

## 2016-01-06 DIAGNOSIS — R0789 Other chest pain: Secondary | ICD-10-CM | POA: Diagnosis not present

## 2016-01-06 DIAGNOSIS — Z8673 Personal history of transient ischemic attack (TIA), and cerebral infarction without residual deficits: Secondary | ICD-10-CM | POA: Diagnosis not present

## 2016-01-06 DIAGNOSIS — Z87440 Personal history of urinary (tract) infections: Secondary | ICD-10-CM | POA: Diagnosis not present

## 2016-01-06 DIAGNOSIS — D519 Vitamin B12 deficiency anemia, unspecified: Secondary | ICD-10-CM | POA: Diagnosis not present

## 2016-01-06 LAB — I-STAT VENOUS BLOOD GAS, ED
ACID-BASE DEFICIT: 2 mmol/L (ref 0.0–2.0)
BICARBONATE: 20.7 mmol/L (ref 20.0–28.0)
O2 SAT: 94 %
PCO2 VEN: 29 mmHg — AB (ref 44.0–60.0)
PO2 VEN: 64 mmHg — AB (ref 32.0–45.0)
TCO2: 22 mmol/L (ref 0–100)
pH, Ven: 7.46 — ABNORMAL HIGH (ref 7.250–7.430)

## 2016-01-06 LAB — BASIC METABOLIC PANEL
ANION GAP: 14 (ref 5–15)
BUN: 29 mg/dL — ABNORMAL HIGH (ref 6–20)
CALCIUM: 9.8 mg/dL (ref 8.9–10.3)
CHLORIDE: 104 mmol/L (ref 101–111)
CO2: 17 mmol/L — AB (ref 22–32)
Creatinine, Ser: 1.85 mg/dL — ABNORMAL HIGH (ref 0.61–1.24)
GFR calc non Af Amer: 36 mL/min — ABNORMAL LOW (ref >60)
GFR, EST AFRICAN AMERICAN: 42 mL/min — AB (ref >60)
GLUCOSE: 128 mg/dL — AB (ref 65–99)
Potassium: 4.1 mmol/L (ref 3.5–5.1)
Sodium: 135 mmol/L (ref 135–145)

## 2016-01-06 LAB — I-STAT CHEM 8, ED
BUN: 32 mg/dL — AB (ref 6–20)
CALCIUM ION: 1.2 mmol/L (ref 1.15–1.40)
CHLORIDE: 103 mmol/L (ref 101–111)
CREATININE: 2.1 mg/dL — AB (ref 0.61–1.24)
GLUCOSE: 127 mg/dL — AB (ref 65–99)
HCT: 40 % (ref 39.0–52.0)
Hemoglobin: 13.6 g/dL (ref 13.0–17.0)
Potassium: 3.9 mmol/L (ref 3.5–5.1)
Sodium: 137 mmol/L (ref 135–145)
TCO2: 21 mmol/L (ref 0–100)

## 2016-01-06 LAB — URINALYSIS, ROUTINE W REFLEX MICROSCOPIC
BILIRUBIN URINE: NEGATIVE
GLUCOSE, UA: NEGATIVE mg/dL
HGB URINE DIPSTICK: NEGATIVE
Ketones, ur: NEGATIVE mg/dL
Leukocytes, UA: NEGATIVE
NITRITE: NEGATIVE
PH: 5.5 (ref 5.0–8.0)
Protein, ur: NEGATIVE mg/dL
SPECIFIC GRAVITY, URINE: 1.046 — AB (ref 1.005–1.030)

## 2016-01-06 LAB — CBC
HEMATOCRIT: 37.5 % — AB (ref 39.0–52.0)
HEMOGLOBIN: 12.6 g/dL — AB (ref 13.0–17.0)
MCH: 34.1 pg — AB (ref 26.0–34.0)
MCHC: 33.6 g/dL (ref 30.0–36.0)
MCV: 101.6 fL — AB (ref 78.0–100.0)
Platelets: 309 10*3/uL (ref 150–400)
RBC: 3.69 MIL/uL — AB (ref 4.22–5.81)
RDW: 13.2 % (ref 11.5–15.5)
WBC: 15 10*3/uL — ABNORMAL HIGH (ref 4.0–10.5)

## 2016-01-06 LAB — TYPE AND SCREEN
ABO/RH(D): O POS
ANTIBODY SCREEN: NEGATIVE

## 2016-01-06 LAB — I-STAT TROPONIN, ED: TROPONIN I, POC: 0 ng/mL (ref 0.00–0.08)

## 2016-01-06 LAB — BRAIN NATRIURETIC PEPTIDE: B Natriuretic Peptide: 17 pg/mL (ref 0.0–100.0)

## 2016-01-06 LAB — I-STAT CG4 LACTIC ACID, ED: LACTIC ACID, VENOUS: 4.31 mmol/L — AB (ref 0.5–1.9)

## 2016-01-06 LAB — ABO/RH: ABO/RH(D): O POS

## 2016-01-06 MED ORDER — SODIUM CHLORIDE 0.9 % IV BOLUS (SEPSIS)
1000.0000 mL | Freq: Once | INTRAVENOUS | Status: AC
Start: 2016-01-06 — End: 2016-01-06
  Administered 2016-01-06: 1000 mL via INTRAVENOUS

## 2016-01-06 MED ORDER — SODIUM CHLORIDE 0.9 % IV BOLUS (SEPSIS): 30.0000 mL/kg | Freq: Once | INTRAVENOUS | Status: DC

## 2016-01-06 MED ORDER — ASPIRIN 81 MG PO CHEW
81.0000 mg | CHEWABLE_TABLET | Freq: Every day | ORAL | Status: DC
  Administered 2016-01-07: 81 mg via ORAL
  Filled 2016-01-06: qty 1

## 2016-01-06 MED ORDER — GABAPENTIN 300 MG PO CAPS
600.0000 mg | ORAL_CAPSULE | Freq: Three times a day (TID) | ORAL | Status: DC
  Administered 2016-01-07 (×2): 600 mg via ORAL
  Filled 2016-01-06 (×2): qty 2

## 2016-01-06 MED ORDER — DEXTROSE 5 % IV SOLN
1.0000 g | Freq: Once | INTRAVENOUS | Status: AC
  Administered 2016-01-06: 1 g via INTRAVENOUS
  Filled 2016-01-06: qty 10

## 2016-01-06 MED ORDER — SODIUM CHLORIDE 0.9 % IV BOLUS (SEPSIS)
1000.0000 mL | Freq: Once | INTRAVENOUS | Status: AC
  Administered 2016-01-06: 1000 mL via INTRAVENOUS

## 2016-01-06 MED ORDER — SODIUM CHLORIDE 0.9 % IV SOLN
INTRAVENOUS | Status: DC
  Administered 2016-01-07: 01:00:00 via INTRAVENOUS

## 2016-01-06 MED ORDER — AMLODIPINE BESYLATE 5 MG PO TABS
5.0000 mg | ORAL_TABLET | Freq: Every day | ORAL | Status: DC
  Administered 2016-01-07: 5 mg via ORAL
  Filled 2016-01-06: qty 1

## 2016-01-06 MED ORDER — TIZANIDINE HCL 2 MG PO TABS: 4.0000 mg | ORAL_TABLET | Freq: Four times a day (QID) | ORAL | Status: DC | PRN

## 2016-01-06 MED ORDER — HEPARIN SODIUM (PORCINE) 5000 UNIT/ML IJ SOLN
5000.0000 [IU] | Freq: Three times a day (TID) | INTRAMUSCULAR | Status: DC
Start: 2016-01-06 — End: 2016-01-07
  Administered 2016-01-07: 5000 [IU] via SUBCUTANEOUS
  Filled 2016-01-06: qty 1

## 2016-01-06 NOTE — ED Notes (Signed)
Patient transported to X-ray 

## 2016-01-06 NOTE — ED Notes (Signed)
Family at bedside. 

## 2016-01-06 NOTE — ED Provider Notes (Signed)
[facility] DEPT Provider Note  CSN: [id number] Arrival Date & Time: 01/06/16 @ 65  History    Chief Complaint Chief Complaint  Patient presents with  . Chest Pain    HPI Nathan Wallace is a 65 y.o. male who presents To the emergency department for assessment of palpitations and near syncope event.  Patient arrives ambulatory however upon arrival is significant hypotensive states that symptoms started today after working outside in yard and were endorsed as a sensation from chest radiating into back that then subsequently radiated into bilateral legs.  Patient has history of leg cramps and states it was similar to this however states that it was more intense.  Patient also was recently diagnosed with UTI 1 week ago and was started on Cipro endorses he still has some urinary discomfort however symptoms have improved.   Patient denies abdominal pain flank pain headache abnormal vision abnormal station focal weakness.  Past Medical & Surgical History    Past Medical History:  Diagnosis Date  . Arthritis    RT KNEE AND RT SHOULDER  . Benign head tremor   . CVA (cerebrovascular accident due to intracerebral hemorrhage) ([place]) 08/03/2011  . Degenerative joint disease of knee, right   . Depression 07/26/2012  . GERD (gastroesophageal reflux disease)    NO RECENT PROBLEMS AND NO MEDS  . Hyperlipidemia    border line  . Impaired glucose tolerance 06/22/2011  . Left inguinal hernia   . MGUS (monoclonal gammopathy of unknown significance)   . Prostate cancer ([place]) 2011  . Stroke [facility])    2013  . Syncope 2012   after prostate surgery; neg neurology work up.    Patient Active Problem List   Diagnosis Date Noted  . Muscle cramp 01/06/2016  . Hypotension 01/06/2016  . AKI (acute kidney injury) ([place]) 01/06/2016  . Metabolic acidosis 76/72/0947  . Lactic acidosis 01/06/2016  . Macrocytic anemia with vitamin B12 deficiency 01/05/2016  . Bilateral wrist pain 11/26/2015  .  Polyarthralgia 09/23/2015  . History of prostate cancer 10/06/2013  . Cervical disc disorder with radiculopathy of cervical region 07/16/2013  . Left rotator cuff tear arthropathy 03/01/2013  . Anemia, unspecified 02/21/2013  . Acute gouty arthritis 11/09/2012  . Depression 07/26/2012  . Impingement syndrome of right shoulder 07/26/2012  . MGUS (monoclonal gammopathy of unknown significance)   . B12 deficiency 12/15/2011  . Peripheral neuropathy ([place]) 12/13/2011  . CVA (cerebrovascular accident due to intracerebral hemorrhage) ([place]) 08/03/2011  . Degenerative joint disease of knee, right   . Benign head tremor   . Encounter for long-term (current) use of high-risk medication 06/23/2011  . Right knee pain 06/23/2011  . HTN (hypertension) 06/23/2011  . Impaired glucose tolerance 06/22/2011  . Encounter for preventative adult health care exam with abnormal findings 06/22/2011  . Left inguinal hernia 06/01/2011  . Left shoulder pain 12/29/2008  . Hyperlipidemia 08/26/2008  . ERECTILE DYSFUNCTION 08/26/2008  . ALLERGIC RHINITIS 08/26/2008  . GERD 08/26/2008  . DIVERTICULOSIS, COLON 08/26/2008  . LOW BACK PAIN 08/26/2008  . RASH-NONVESICULAR 08/26/2008   Past Surgical History:  Procedure Laterality Date  . COLONOSCOPY    . INGUINAL HERNIA REPAIR  06/01/2011   Procedure: HERNIA REPAIR INGUINAL ADULT;  Surgeon: Adin Hector, MD;  Location: [facility] ORS;  Service: General;  Laterality: Left;  Left Inguinal Hernia Repair with Mesh  . ROBOT ASSISTED LAPAROSCOPIC RADICAL PROSTATECTOMY  jan 2012   da vinci  . [place]  2003 OR 2004   CERVICAL  FUSION-STATES LIMITED ROM NECK  . WRIST SURGERY      Family & Social History    Family History  Problem Relation Age of Onset  . Hypertension Mother   . Aneurysm Mother     brain  . Cancer Father     colon  . Colon cancer Father   . Heart attack Brother    Social History  Substance Use Topics  . Smoking status: Former Smoker    Quit  date: 06/28/1966  . Smokeless tobacco: Never Used     Comment: QUIT SMOKING IN THE 70'S  . Alcohol use 3.0 oz/week    5 Cans of beer per week     Comment: 5th a liquor a week beer and vodka    Home Medications    Prior to Admission medications   Medication Sig Start Date End Date Taking? Authorizing Provider  allopurinol (ZYLOPRIM) 300 MG tablet Take 1 tablet (300 mg total) by mouth daily. 11/03/15  Yes Lyndal Pulley, DO  amLODipine (NORVASC) 5 MG tablet Take 1 tablet by mouth  daily 05/22/15  Yes Biagio Borg, MD  aspirin 81 MG chewable tablet Chew 81 mg by mouth daily.   Yes Historical Provider, MD  atorvastatin (LIPITOR) 40 MG tablet Take 1 tablet by mouth  daily 12/29/15  Yes Biagio Borg, MD  Cholecalciferol (VITAMIN D-3) 1000 units CAPS Take 1,000 Units by mouth daily.   Yes Historical Provider, MD  gabapentin (NEURONTIN) 300 MG capsule Take 2 capsules (600 mg total) by mouth 3 (three) times daily. 08/31/15  Yes Lyndal Pulley, DO  irbesartan (AVAPRO) 300 MG tablet Take 1 tablet by mouth at  bedtime 09/14/15  Yes Biagio Borg, MD  tiZANidine (ZANAFLEX) 4 MG tablet Take 1 tablet by mouth 3  times daily as needed for  muscle spasm(s) 02/11/15  Yes Biagio Borg, MD  traMADol (ULTRAM) 50 MG tablet Take 1 tablet (50 mg total) by mouth 2 (two) times daily as needed. Patient taking differently: Take 50 mg by mouth 2 (two) times daily as needed (for pain).  05/25/15  Yes Biagio Borg, MD  colchicine 0.6 MG tablet Take 1 tablet (0.6 mg total) by mouth 2 (two) times daily. For 5 days when having flare. Patient not taking: Reported on 01/06/2016 11/03/15   Lyndal Pulley, DO    Allergies    Review of patient's allergies indicates no known allergies.  Review of Systems  ROS   Physical Exam Updated Vital Signs BP 137/82   Pulse 74   Temp 97.7 F (36.5 C) (Rectal)   Resp 16   SpO2 100%   Physical Exam CONST: Patient alert, well appearing, in no apparent distress, oriented to person,  place and time, anxious.  EYES: PERRLA. EOMI. Conjunctiva w/o d/c. Lids AT w/o swelling.  ENMT: External Nares & Ears AT w/o swelling. Oropharynx patent. MM dry.  NECK: ROM full w/o rigidity. Trachea midline. JVD absent.  CVS: +S1/S2 w/o obvious murmur. Lower extremities w/o pitting edema.  RESP: Respiratory effort unlabored w/o retractions or accessory muscle use. BS clear bilaterally.  GI: Soft & ND. +BS x 4. TTP absent. Hernia absent. Guarding & Rebound absent.  BACK: CVA TTP absent bilaterally.  SKIN: Skin warm & dry. Turgor good. No rash.  PSYCH: Alert. Oriented. Affect and mood appropriate.  NEURO: CN II-XII grossly intact. Motor exam symmetric w/ upper & lower extremities 5/5 bilaterally. Sensation grossly intact.  MSK: Joints located & stable, w/o  obvious dislocation & obvious deformity or crepitus absent w/ Cap refill < 2 sec. Peripheral pulses 2+ & equal in all extremities.   ED Treatments / Results  Labs (all labs ordered are listed, but only abnormal results are displayed) Labs Reviewed  BASIC METABOLIC PANEL - Abnormal; Notable for the following:       Result Value   CO2 17 (*)    Glucose, Bld 128 (*)    BUN 29 (*)    Creatinine, Ser 1.85 (*)    GFR calc non Af Amer 36 (*)    GFR calc Af Amer 42 (*)    All other components within normal limits  CBC - Abnormal; Notable for the following:    WBC 15.0 (*)    RBC 3.69 (*)    Hemoglobin 12.6 (*)    HCT 37.5 (*)    MCV 101.6 (*)    MCH 34.1 (*)    All other components within normal limits  URINALYSIS, ROUTINE W REFLEX MICROSCOPIC (NOT AT [facility]) - Abnormal; Notable for the following:    Specific Gravity, Urine 1.046 (*)    All other components within normal limits  I-STAT CG4 LACTIC ACID, ED - Abnormal; Notable for the following:    Lactic Acid, Venous 4.31 (*)    All other components within normal limits  I-STAT VENOUS BLOOD GAS, ED - Abnormal; Notable for the following:    pH, Ven 7.460 (*)    pCO2, Ven 29.0 (*)     pO2, Ven 64.0 (*)    All other components within normal limits  I-STAT CHEM 8, ED - Abnormal; Notable for the following:    BUN 32 (*)    Creatinine, Ser 2.10 (*)    Glucose, Bld 127 (*)    All other components within normal limits  URINE CULTURE  CULTURE, BLOOD (ROUTINE X 2)  CULTURE, BLOOD (ROUTINE X 2)  BRAIN NATRIURETIC PEPTIDE  I-STAT TROPOININ, ED  TYPE AND SCREEN  ABO/RH    EKG    EKG Interpretation  Date/Time:    Ventricular Rate:    PR Interval:    QRS Duration:   QT Interval:    QTC Calculation:   R Axis:     Text Interpretation:         EKG Interpretation  Date/Time:    Ventricular Rate:    PR Interval:    QRS Duration:   QT Interval:    QTC Calculation:   R Axis:     Text Interpretation:         Radiology Dg Chest 2 View  Result Date: 01/06/2016 CLINICAL DATA:  Left sided chest pain began last night with SOB and radiation to left arm with nausea and tingling of left hand/fingers. Pt.diagnosed with monoclonal gammopathy of unknown significance in 2013. EXAM: CHEST  2 VIEW COMPARISON:  04/20/2010.  Chest CT, 01/06/2016 FINDINGS: The heart size and mediastinal contours are within normal limits. Both lungs are clear. No pleural effusion or pneumothorax. The visualized skeletal structures are unremarkable. IMPRESSION: No active cardiopulmonary disease. Electronically Signed   By: Lajean Manes M.D.   On: 01/06/2016 18:28   Ct Angio Chest/abd/pel For Dissection W And/or Wo Contrast  Result Date: 01/06/2016 CLINICAL DATA:  Chest pain beginning last night. Pain extending to the legs. Short of breath with nausea. Hypotension. Evaluate for aortic dissection. EXAM: CT ANGIOGRAPHY CHEST, ABDOMEN AND PELVIS TECHNIQUE: Multidetector CT imaging through the chest, abdomen and pelvis was performed using the standard protocol during  bolus administration of intravenous contrast. Multiplanar reconstructed images and MIPs were obtained and reviewed to evaluate the vascular  anatomy. CONTRAST:  100 mL of Isovue 370 intravenous contrast COMPARISON:  CTA chest, 11/03/2011 FINDINGS: CTA CHEST FINDINGS Cardiovascular: Thoracic aorta is normal caliber. No dissection. No atherosclerotic plaque. Aortic arch branch vessels are widely patent. Heart is normal in size and configuration. No coronary artery calcifications. Pulmonary arteries are normal in caliber. Mediastinum/Nodes: No enlarged mediastinal, hilar, or axillary lymph nodes. Thyroid gland, trachea, and esophagus demonstrate no significant findings. Lungs/Pleura: There is dependent lower lobe opacity consistent with atelectasis. Calcified granuloma is noted in the left lower lobe. There are 2 6 mm nodules in the right lung, 1 peripherally in the right lower lobe, image 81, series 7 and the other in the right mid lobe posteriorly adjacent to the oblique fissure, also measuring 6 mm. These are both stable from the prior exam. No convincing pneumonia. No evidence of pulmonary edema. No pleural effusion or pneumothorax. Musculoskeletal: No chest wall abnormality. No acute or significant osseous findings. Review of the MIP images confirms the above findings. CTA ABDOMEN AND PELVIS FINDINGS VASCULAR Aorta: Darrick Grinder do you need me to do the wall normal caliber. No dissection. No atherosclerotic plaque. Celiac: Widely patent.  No plaque. SMA: Partly patent.  No plaque. Renals: Widely patent. No plaque. Small accessory renal artery the right. IMA: Widely patent. Inflow: Normal Veins: Not opacified on this exam. Review of the MIP images confirms the above findings. NON-VASCULAR Hepatobiliary: 6 mm low-density lesion in the right lobe consistent with cysts. This is stable from the prior CT. No other liver abnormality. Normal gallbladder. No bile duct dilation. Pancreas: Unremarkable. No pancreatic ductal dilatation or surrounding inflammatory changes. Spleen: Normal in size without focal abnormality. Adrenals/Urinary Tract: Normal adrenal glands. 4.5  cm left renal midpole cyst. No other renal masses, no stones and no hydronephrosis. Ureters and bladder are unremarkable. Stomach/Bowel: Moderate distention of the stomach. No stomach wall thickening or inflammatory changes. Normal small bowel. There are few scattered colonic diverticula. No diverticulitis. Colon otherwise unremarkable. Normal appendix visualized. Lymphatic: No enlarged lymph nodes. Reproductive: Unremarkable. Other: No abdominal wall hernia or abnormality. No abdominopelvic ascites. Musculoskeletal: No osteoblastic or osteolytic lesions. Significant degenerative changes, both disc and facet, of the lower lumbar spine. Review of the MIP images confirms the above findings. IMPRESSION: 1. Normal appearance of the thoracoabdominal aorta. No aneurysm. No dissection. No atherosclerotic plaque. 2. Aortic branch vessels are widely patent. 3. There is dependent atelectasis in the lungs. Lungs are otherwise clear. No acute findings on chest CT. 4. No acute abnormalities in the abdomen or pelvis. Electronically Signed   By: Lajean Manes M.D.   On: 01/06/2016 17:27    Procedures (including critical care time) Procedures  Medications Ordered in ED Medications  sodium chloride 0.9 % bolus 2,109 mL (2,109 mLs Intravenous Not Given 01/06/16 1841)  sodium chloride 0.9 % bolus 1,000 mL (0 mLs Intravenous Stopped 01/06/16 1738)  sodium chloride 0.9 % bolus 1,000 mL (0 mLs Intravenous Stopped 01/06/16 1738)  cefTRIAXone (ROCEPHIN) 1 g in dextrose 5 % 50 mL IVPB (0 g Intravenous Stopped 01/06/16 2024)    Initial Impression / Assessment and Plan / ED Course  I have reviewed the triage vital signs and the nursing notes.  Pertinent labs & imaging results that were available during my care of the patient were reviewed by me and considered in my medical decision making (see chart for details).  Clinical Course  Nathan Wallace presents to the ED with chest pain and leg cramping since onset of last night.  Arrives HD stable but had low BP x1 in the ED. Because the patient was not altered and does not appear toxic or in distress, and patient afebrile. I have considered and estimate there is low risk for Sepsis or other serious bacterial infection however will send Lactic acid and hold ABx given criteria not met initially. Recent UTI treatment and Abx course completed on Monday. Also denies urinary endorsements, flank pain absent.  Due to pain endorsements concern for ACS, PE, vs Aortic Dissection therefore obtained troponin, ECG and CTA Chest & Abdomen. Imaging unremarkable for acute concerns.  Because of elevated LA now met Sepsis criteria & "Code Sepsis" called. I obtained serial lactates, multiple blood cultures, & urine cultures. Per the patient's H&P, I therefore administered IV antibiotic regimen of Rocephin due to my concern for Urosepsis.  I administered IV Fluid resuscitation w/ an initial bolus of 30 ml/kg of NS.  In light of above & the complexity of the patient's medical condition, I believe the patient will require admission for continued medical intervention. ED Course in its entirety was reviewed w/ the patient and relative(s). I therefore consulted the Hospitalist Service for admission & we discussed the patient's ED course & they have agreed to admit. They request no further interventions prior to the patient's transport from the ED. Patient stable for transport. Level of Care determined by the Admitting Service.  Patient care discussed w/ my attending physician, who oversaw & agreed w/ their evaluation & treatment.  Final Clinical Impressions(s) / ED Diagnoses   Final diagnoses:  Sepsis, due to unspecified organism [facility])   New Prescriptions New Prescriptions   No medications on file   Patient care discussed with the attending physician, who oversaw their evaluation & treatment & voiced agreement.  Note: This document was prepared using Dragon voice recognition software and may  include unintentional dictation errors.  House Officer: Voncille Lo, MD, Emergency Medicine Resident.   Voncille Lo, MD 01/10/16 2101    [place], MD 01/12/16 9400

## 2016-01-06 NOTE — Progress Notes (Signed)
Chaplain responded to another [employer]. Chaplain got details of page from desk and asked nurse details. Chaplain escorted family to bedside. Chaplain prayed with both family and pt. Asked if they needed anything futher. Nothing further needed,   01/06/16 1800  Clinical Encounter Type  Visited With Patient and family together  Visit Type Initial  Referral From Chaplain  Spiritual Encounters  Spiritual Needs Prayer  Stress Factors  Patient Stress Factors Health changes  Family Stress Factors None identified

## 2016-01-06 NOTE — H&P (Signed)
History and Physical    Nathan Wallace [id number] DOB: November 19, 1950 DOA: 01/06/2016  PCP: Cathlean Cower, MD Patient coming from: Home  Chief Complaint: leg and chest pain  HPI: Nathan Wallace is a 65 y.o. male with medical history significant of essential hypertension, gout, CAD, hyperlipidemia. He states that last night, he started having left leg cramping and pain which radiated into his chest and then radiated down his right leg. He states that the pain was severe. The pain eventually resolved spontaneously and he was able to go to bed. He states that today, the same exact symptom happened again which prompted him to be evaluated in the emergency department. He states that this pain and cramping starts spontaneously without provocative event. This has not happened before prior to last night. He has not been ill recently. He denies any fevers, chills, shortness of breath, cough, abdominal pain, nausea or vomiting. He is having normal bowel movements. He was recently treated for UTI with Cipro and completed treatment on Monday. He denies any dysuria at this time. Currently, he complains of some numbness in his feet but otherwise is asymptomatic on exam.   ED Course: CTA chest and abdomen was obtained to rule out dissection as patient presented initially with hypotension and chest pain. This was unremarkable. Patient was given Rocephin for presumption of UTI and sepsis. TRH consulted for management and evaluation.   Review of Systems: As per HPI otherwise 10 point review of systems negative.    Past Medical History:  Diagnosis Date  . Arthritis    RT KNEE AND RT SHOULDER  . Benign head tremor   . CVA (cerebrovascular accident due to intracerebral hemorrhage) ([place]) 08/03/2011  . Degenerative joint disease of knee, right   . Depression 07/26/2012  . GERD (gastroesophageal reflux disease)    NO RECENT PROBLEMS AND NO MEDS  . Hyperlipidemia    border line  . Impaired glucose tolerance 06/22/2011    . Left inguinal hernia   . MGUS (monoclonal gammopathy of unknown significance)   . Prostate cancer ([place]) 2011  . Stroke [facility])    2013  . Syncope 2012   after prostate surgery; neg neurology work up.     Past Surgical History:  Procedure Laterality Date  . COLONOSCOPY    . INGUINAL HERNIA REPAIR  06/01/2011   Procedure: HERNIA REPAIR INGUINAL ADULT;  Surgeon: Adin Hector, MD;  Location: [facility] ORS;  Service: General;  Laterality: Left;  Left Inguinal Hernia Repair with Mesh  . ROBOT ASSISTED LAPAROSCOPIC RADICAL PROSTATECTOMY  jan 2012   da vinci  . SPINE SURGERY  2003 OR 2004   CERVICAL FUSION-STATES LIMITED ROM NECK  . WRIST SURGERY       reports that he quit smoking about 49 years ago. He has never used smokeless tobacco. He reports that he drinks about 3.0 oz of alcohol per week . He reports that he does not use drugs.  No Known Allergies  Family History  Problem Relation Age of Onset  . Hypertension Mother   . Aneurysm Mother     brain  . Cancer Father     colon  . Colon cancer Father   . Heart attack Brother     Prior to Admission medications   Medication Sig Start Date End Date Taking? Authorizing Provider  allopurinol (ZYLOPRIM) 300 MG tablet Take 1 tablet (300 mg total) by mouth daily. 11/03/15   Lyndal Pulley, DO  amLODipine (NORVASC) 5 MG tablet Take 1 tablet  by mouth  daily 05/22/15   Biagio Borg, MD  aspirin 81 MG chewable tablet Chew 81 mg by mouth daily.    Historical Provider, MD  atorvastatin (LIPITOR) 40 MG tablet Take 1 tablet by mouth  daily 12/29/15   Biagio Borg, MD  colchicine 0.6 MG tablet Take 1 tablet (0.6 mg total) by mouth 2 (two) times daily. For 5 days when having flare. 11/03/15   Lyndal Pulley, DO  gabapentin (NEURONTIN) 300 MG capsule Take 2 capsules (600 mg total) by mouth 3 (three) times daily. 08/31/15   Lyndal Pulley, DO  irbesartan (AVAPRO) 300 MG tablet Take 1 tablet by mouth at  bedtime 09/14/15   Biagio Borg, MD  PRESCRIPTION  MEDICATION Matrex injection at home every other day    Historical Provider, MD  tiZANidine (ZANAFLEX) 4 MG tablet Take 1 tablet by mouth 3  times daily as needed for  muscle spasm(s) 02/11/15   Biagio Borg, MD  traMADol (ULTRAM) 50 MG tablet Take 1 tablet (50 mg total) by mouth 2 (two) times daily as needed. 05/25/15   Biagio Borg, MD    Physical Exam: Vitals:   01/06/16 1700 01/06/16 1703 01/06/16 1730 01/06/16 1745  BP: 91/66  100/77 114/74  Pulse: 77  80 77  Resp: 16     Temp:  97.7 F (36.5 C)    TempSrc:  Rectal    SpO2: 100%  99% 100%    Constitutional: NAD, calm, anxious  Eyes: PERRL, lids and conjunctivae normal ENMT: Mucous membranes are moist. Posterior pharynx clear of any exudate or lesions. Normal dentition.  Neck: normal, supple, no masses, no thyromegaly Respiratory: clear to auscultation bilaterally, no wheezing, no crackles. Normal respiratory effort. No accessory muscle use.  Cardiovascular: Regular rate and rhythm, no murmurs / rubs / gallops. No extremity edema. 2+ pedal pulses.  Abdomen: no tenderness, no masses palpated. No hepatosplenomegaly. Bowel sounds positive.  Musculoskeletal: no clubbing / cyanosis. No joint deformity upper and lower extremities. Good ROM, no contractures. Normal muscle tone. +visible muscle cramps and tightening diffusely in bilateral thighs, right bicep, right anterior chest  Skin: no rashes, lesions, ulcers. No induration Neurologic: CN 2-12 grossly intact. Sensation intact, DTR normal. Strength 5/5 in all 4.  Psychiatric: Normal judgment and insight. Alert and oriented x 3. Normal mood.   Labs on Admission: I have personally reviewed following labs and imaging studies  CBC:  Recent Labs Lab 01/06/16 1630 01/06/16 1651  WBC 15.0*  --   HGB 12.6* 13.6  HCT 37.5* 40.0  MCV 101.6*  --   PLT 309  --    Basic Metabolic Panel:  Recent Labs Lab 01/06/16 1630 01/06/16 1651  NA 135 137  K 4.1 3.9  CL 104 103  CO2 17*  --     GLUCOSE 128* 127*  BUN 29* 32*  CREATININE 1.85* 2.10*  CALCIUM 9.8  --    GFR: Estimated Creatinine Clearance: 34.4 mL/min (by C-G formula based on SCr of 2.1 mg/dL (H)). Liver Function Tests: No results for input(s): AST, ALT, ALKPHOS, BILITOT, PROT, ALBUMIN in the last 168 hours. No results for input(s): LIPASE, AMYLASE in the last 168 hours. No results for input(s): AMMONIA in the last 168 hours. Coagulation Profile: No results for input(s): INR, PROTIME in the last 168 hours. Cardiac Enzymes: No results for input(s): CKTOTAL, CKMB, CKMBINDEX, TROPONINI in the last 168 hours. BNP (last 3 results) No results for input(s): PROBNP in  the last 8760 hours. HbA1C: No results for input(s): HGBA1C in the last 72 hours. CBG: No results for input(s): GLUCAP in the last 168 hours. Lipid Profile: No results for input(s): CHOL, HDL, LDLCALC, TRIG, CHOLHDL, LDLDIRECT in the last 72 hours. Thyroid Function Tests: No results for input(s): TSH, T4TOTAL, FREET4, T3FREE, THYROIDAB in the last 72 hours. Anemia Panel: No results for input(s): VITAMINB12, FOLATE, FERRITIN, TIBC, IRON, RETICCTPCT in the last 72 hours. Urine analysis:    Component Value Date/Time   COLORURINE YELLOW 01/06/2016 1703   APPEARANCEUR CLEAR 01/06/2016 1703   LABSPEC 1.046 (H) 01/06/2016 1703   PHURINE 5.5 01/06/2016 1703   GLUCOSEU NEGATIVE 01/06/2016 1703   GLUCOSEU NEGATIVE 09/23/2015 1421   HGBUR NEGATIVE 01/06/2016 1703   BILIRUBINUR NEGATIVE 01/06/2016 1703   KETONESUR NEGATIVE 01/06/2016 1703   PROTEINUR NEGATIVE 01/06/2016 1703   UROBILINOGEN 0.2 09/23/2015 1421   NITRITE NEGATIVE 01/06/2016 1703   LEUKOCYTESUR NEGATIVE 01/06/2016 1703   Sepsis Labs: !!!!!!!!!!!!!!!!!!!!!!!!!!!!!!!!!!!!!!!!!!!! @LABRCNTIP (procalcitonin:4,lacticidven:4) )No results found for this or any previous visit (from the past 240 hour(s)).   Radiological Exams on Admission: Dg Chest 2 View  Result Date: 01/06/2016 CLINICAL  DATA:  Left sided chest pain began last night with SOB and radiation to left arm with nausea and tingling of left hand/fingers. Pt.diagnosed with monoclonal gammopathy of unknown significance in 2013. EXAM: CHEST  2 VIEW COMPARISON:  04/20/2010.  Chest CT, 01/06/2016 FINDINGS: The heart size and mediastinal contours are within normal limits. Both lungs are clear. No pleural effusion or pneumothorax. The visualized skeletal structures are unremarkable. IMPRESSION: No active cardiopulmonary disease. Electronically Signed   By: Lajean Manes M.D.   On: 01/06/2016 18:28   Ct Angio Chest/abd/pel For Dissection W And/or Wo Contrast  Result Date: 01/06/2016 CLINICAL DATA:  Chest pain beginning last night. Pain extending to the legs. Short of breath with nausea. Hypotension. Evaluate for aortic dissection. EXAM: CT ANGIOGRAPHY CHEST, ABDOMEN AND PELVIS TECHNIQUE: Multidetector CT imaging through the chest, abdomen and pelvis was performed using the standard protocol during bolus administration of intravenous contrast. Multiplanar reconstructed images and MIPs were obtained and reviewed to evaluate the vascular anatomy. CONTRAST:  100 mL of Isovue 370 intravenous contrast COMPARISON:  CTA chest, 11/03/2011 FINDINGS: CTA CHEST FINDINGS Cardiovascular: Thoracic aorta is normal caliber. No dissection. No atherosclerotic plaque. Aortic arch branch vessels are widely patent. Heart is normal in size and configuration. No coronary artery calcifications. Pulmonary arteries are normal in caliber. Mediastinum/Nodes: No enlarged mediastinal, hilar, or axillary lymph nodes. Thyroid gland, trachea, and esophagus demonstrate no significant findings. Lungs/Pleura: There is dependent lower lobe opacity consistent with atelectasis. Calcified granuloma is noted in the left lower lobe. There are 2 6 mm nodules in the right lung, 1 peripherally in the right lower lobe, image 81, series 7 and the other in the right mid lobe posteriorly  adjacent to the oblique fissure, also measuring 6 mm. These are both stable from the prior exam. No convincing pneumonia. No evidence of pulmonary edema. No pleural effusion or pneumothorax. Musculoskeletal: No chest wall abnormality. No acute or significant osseous findings. Review of the MIP images confirms the above findings. CTA ABDOMEN AND PELVIS FINDINGS VASCULAR Aorta: Darrick Grinder do you need me to do the wall normal caliber. No dissection. No atherosclerotic plaque. Celiac: Widely patent.  No plaque. SMA: Partly patent.  No plaque. Renals: Widely patent. No plaque. Small accessory renal artery the right. IMA: Widely patent. Inflow: Normal Veins: Not opacified on this exam. Review of  the MIP images confirms the above findings. NON-VASCULAR Hepatobiliary: 6 mm low-density lesion in the right lobe consistent with cysts. This is stable from the prior CT. No other liver abnormality. Normal gallbladder. No bile duct dilation. Pancreas: Unremarkable. No pancreatic ductal dilatation or surrounding inflammatory changes. Spleen: Normal in size without focal abnormality. Adrenals/Urinary Tract: Normal adrenal glands. 4.5 cm left renal midpole cyst. No other renal masses, no stones and no hydronephrosis. Ureters and bladder are unremarkable. Stomach/Bowel: Moderate distention of the stomach. No stomach wall thickening or inflammatory changes. Normal small bowel. There are few scattered colonic diverticula. No diverticulitis. Colon otherwise unremarkable. Normal appendix visualized. Lymphatic: No enlarged lymph nodes. Reproductive: Unremarkable. Other: No abdominal wall hernia or abnormality. No abdominopelvic ascites. Musculoskeletal: No osteoblastic or osteolytic lesions. Significant degenerative changes, both disc and facet, of the lower lumbar spine. Review of the MIP images confirms the above findings. IMPRESSION: 1. Normal appearance of the thoracoabdominal aorta. No aneurysm. No dissection. No atherosclerotic plaque. 2.  Aortic branch vessels are widely patent. 3. There is dependent atelectasis in the lungs. Lungs are otherwise clear. No acute findings on chest CT. 4. No acute abnormalities in the abdomen or pelvis. Electronically Signed   By: Lajean Manes M.D.   On: 01/06/2016 17:27   Reviewed chest x-ray and chest CT independently.  Assessment/Plan Principal Problem:   AKI (acute kidney injury) ([place]) Active Problems:   Hyperlipidemia   HTN (hypertension)   B12 deficiency   MGUS (monoclonal gammopathy of unknown significance)   History of prostate cancer   Muscle cramp   Hypotension   Metabolic acidosis   Lactic acidosis  AKI -Unclear etiology  -Baseline Cr 1.2 -UA normal -Obtain urine Na, urine Cr -IVF -Monitor BMP  Bilateral leg cramps with radiation into chest  -Unclear etiology. He states that he has frequent hand cramp but has not experienced leg cramps in the past. He takes Zanaflex at home for this. -K and Ca wnl -Check Mg, Phos, Ck, TSH, troponin, iron studies  -Check EKG  -Continue home Neurontin, zanaflex   Hypotension  -Resolved with IVF -Unclear etiology, perhaps dehydration   -Monitor   HAG Metabolic acidosis and lactic acidosis  -AG 14 -Likely secondary to uremia, AKI, lactic acidosis  -Repeat lactic acid -IVF -Monitor BMP   HTN -Continue home Norvasc -Irbesartan - hold for AKI   CAD -Continue home Asa   HLD -Lipitor - will hold in setting of diffuse muscle cramping   Chronic macrocytic anemia -Receives vitamin B12 injections as outpatient -Could be related to alcohol intake   MGUS  -Dx in 2013 -Follows with Dr. Alvy Bimler   Hx prostate cancer s/p resection -Follows urology outpatient    DVT prophylaxis: subq hep  Code Status: full  Family Communication: no family present  Disposition Plan: home   Consults called: None Admission status: observation, med surg  Dessa Phi, DO [facility] Pager [phone]  If 7PM-7AM, please contact  night-coverage [email or web address] Password [facility] 01/06/2016, 7:16 PM

## 2016-01-06 NOTE — ED Notes (Signed)
Wife called to make sure contact number in the chart was correct. Her information is  Karter Preiser [phone]

## 2016-01-06 NOTE — ED Notes (Signed)
Meal given.  No complaints voiced by patient

## 2016-01-06 NOTE — ED Triage Notes (Signed)
Pt states he started having CP last night. States the pain moved to his legs today. Pt doubled over in pain. Pt states he is SOB and nauseous.

## 2016-01-07 ENCOUNTER — Other Ambulatory Visit: Payer: Self-pay

## 2016-01-07 DIAGNOSIS — N179 Acute kidney failure, unspecified: Secondary | ICD-10-CM | POA: Diagnosis not present

## 2016-01-07 DIAGNOSIS — E538 Deficiency of other specified B group vitamins: Secondary | ICD-10-CM | POA: Diagnosis not present

## 2016-01-07 DIAGNOSIS — E785 Hyperlipidemia, unspecified: Secondary | ICD-10-CM

## 2016-01-07 DIAGNOSIS — I10 Essential (primary) hypertension: Secondary | ICD-10-CM | POA: Diagnosis not present

## 2016-01-07 LAB — CBC
HCT: 36.5 % — ABNORMAL LOW (ref 39.0–52.0)
Hemoglobin: 12 g/dL — ABNORMAL LOW (ref 13.0–17.0)
MCH: 33.5 pg (ref 26.0–34.0)
MCHC: 32.9 g/dL (ref 30.0–36.0)
MCV: 102 fL — ABNORMAL HIGH (ref 78.0–100.0)
PLATELETS: 260 10*3/uL (ref 150–400)
RBC: 3.58 MIL/uL — AB (ref 4.22–5.81)
RDW: 13.1 % (ref 11.5–15.5)
WBC: 10 10*3/uL (ref 4.0–10.5)

## 2016-01-07 LAB — BASIC METABOLIC PANEL
ANION GAP: 5 (ref 5–15)
BUN: 20 mg/dL (ref 6–20)
CALCIUM: 8.9 mg/dL (ref 8.9–10.3)
CO2: 25 mmol/L (ref 22–32)
Chloride: 107 mmol/L (ref 101–111)
Creatinine, Ser: 1.32 mg/dL — ABNORMAL HIGH (ref 0.61–1.24)
GFR, EST NON AFRICAN AMERICAN: 55 mL/min — AB (ref >60)
Glucose, Bld: 106 mg/dL — ABNORMAL HIGH (ref 65–99)
POTASSIUM: 4.1 mmol/L (ref 3.5–5.1)
Sodium: 137 mmol/L (ref 135–145)

## 2016-01-07 LAB — IRON AND TIBC
IRON: 65 ug/dL (ref 45–182)
Saturation Ratios: 19 % (ref 17.9–39.5)
TIBC: 350 ug/dL (ref 250–450)
UIBC: 285 ug/dL

## 2016-01-07 LAB — URINE CULTURE: Culture: <10000 — AB

## 2016-01-07 LAB — SODIUM, URINE, RANDOM: SODIUM UR: 76 mmol/L

## 2016-01-07 LAB — CK: Total CK: 333 U/L (ref 49–397)

## 2016-01-07 LAB — CREATININE, SERUM
CREATININE: 1.4 mg/dL — AB (ref 0.61–1.24)
GFR calc Af Amer: 59 mL/min — ABNORMAL LOW (ref >60)
GFR, EST NON AFRICAN AMERICAN: 51 mL/min — AB (ref >60)

## 2016-01-07 LAB — CALCIUM: Calcium: 9.1 mg/dL (ref 8.9–10.3)

## 2016-01-07 LAB — TSH: TSH: 3.382 u[IU]/mL (ref 0.350–4.500)

## 2016-01-07 LAB — LACTIC ACID, PLASMA: Lactic Acid, Venous: 1.4 mmol/L (ref 0.5–1.9)

## 2016-01-07 LAB — FERRITIN: FERRITIN: 380 ng/mL — AB (ref 24–336)

## 2016-01-07 LAB — MAGNESIUM: Magnesium: 2.5 mg/dL — ABNORMAL HIGH (ref 1.7–2.4)

## 2016-01-07 LAB — TROPONIN I

## 2016-01-07 LAB — PHOSPHORUS: PHOSPHORUS: 5.1 mg/dL — AB (ref 2.5–4.6)

## 2016-01-07 NOTE — Care Management Obs Status (Deleted)
[place] NOTIFICATION   Patient Details  Name: Nathan Wallace MRN: [id number] Date of Birth: 10/26/1950   Medicare Observation Status Notification Given:  Yes    Norina Buzzard, RN 01/07/2016, 2:14 PM

## 2016-01-07 NOTE — Care Management Obs Status (Signed)
[place] NOTIFICATION   Patient Details  Name: Theodus Mcmurdie MRN: [id number] Date of Birth: 1951-03-01   Medicare Observation Status Notification Given:  Yes    Norina Buzzard, RN 01/07/2016, 2:14 PM

## 2016-01-07 NOTE — Discharge Summary (Addendum)
PATIENT DETAILS Name: Nathan Wallace Age: 65 y.o. Sex: male Date of Birth: [birth year] MRN: [id number]. Admitting Physician: Shon Millet, DO [id number] Jenny Reichmann, MD  Admit Date: 01/06/2016 Discharge date: 01/07/2016  Recommendations for Outpatient Follow-up:  1. Follow up with PCP in 1-2 weeks 2. Please obtain BMP/CBC in one week 3. Avapro and Lipitor temporarily on hold-resume at next visit if no further leg cramps/renal function normal 4. Blood cx are still pending at the time of discharge-please follow  Admitted From:  Home  Disposition: [place]: No  Equipment/Devices: None  Discharge Condition: Stable  CODE STATUS: FULL CODE  Diet recommendation:  Heart Healthy  Brief Summary: See H&P, Labs, Consult and Test reports for all details in brief, patient is a 65 year old male with history of hypertension, prior history of prostate cancer, MGUS who presented to the hospital with left leg cramp going to his chest, and then proceeding down his right leg. He was found to have acute kidney injury, was mildly hypotensive and subsequently admitted to the hospitalist service for further evaluation and treatment.   Brief Hospital Course: Bilateral leg cramps with radiation to the chest:  I suspect this could have been secondary to renal failure and mild hypertension. He no longer has had any of these symptoms while hospitalized. He has ambulated in the hallway. CT angiogram of the chest and abdomen was negative for dissection. There is no swelling in his legs. He feels much better and is requesting discharge home. Etiology of this these symptoms remain unknown-as they are very nonspecific. I will continue to hold his statin for the next few days-half asked him to go to his primary care practitioner sometime next week-and if he is no longer having any symptoms should be okay to resume.  Transient hypotension: Suspect secondary to volume loss-per patient/family  patient was probably dehydrated-he has been working out a lot in his yard in this hot weather. Antihypertensives were briefly held, he was provided with IV fluids. Blood pressure subsequently normalized. Stable for discharge. See below.  Acute kidney injury: Suspect mild prerenal azotemia-much improved with hydration. We'll continue to hold Avapro on discharge, she should be okay to resume if chemistry panel has normalized-deferred to PCP. Patient has to follow with PCP next week for a repeat chemistry panel.  Hypertension: Initially hypotensive on admission-he was continued on his Norvasc-provided IV fluids-his blood pressure currently is controlled. See above regarding Avapro.  Dyslipidemia: Out of caution-we'll continue to hold Lipitor for the next few days-given unknown etiology of diffuse muscle cramping.  Chronic macrocytic anemia: Follows up with hematology-continue B12 injections as outpatient  MGUS: Continue outpatient follow-up with hematology/oncology  Procedures/Studies: None  Discharge Diagnoses:  Principal Problem:   AKI (acute kidney injury) ([place]) Active Problems:   Hyperlipidemia   HTN (hypertension)   B12 deficiency   MGUS (monoclonal gammopathy of unknown significance)   History of prostate cancer   Muscle cramp   Hypotension   Metabolic acidosis   Lactic acidosis   Discharge Instructions:  Activity:  As tolerated with Full fall precautions use walker/cane & assistance as needed  Discharge Instructions    Call MD for:  persistant nausea and vomiting    Complete by:  As directed    Call MD for:  severe uncontrolled pain    Complete by:  As directed    Diet - low sodium heart healthy    Complete by:  As directed    Increase activity slowly  Complete by:  As directed        Medication List    STOP taking these medications   atorvastatin 40 MG tablet Commonly known as:  LIPITOR   irbesartan 300 MG tablet Commonly known as:  AVAPRO     TAKE  these medications   allopurinol 300 MG tablet Commonly known as:  ZYLOPRIM Take 1 tablet (300 mg total) by mouth daily.   amLODipine 5 MG tablet Commonly known as:  NORVASC Take 1 tablet by mouth  daily   aspirin 81 MG chewable tablet Chew 81 mg by mouth daily.   colchicine 0.6 MG tablet Take 1 tablet (0.6 mg total) by mouth 2 (two) times daily. For 5 days when having flare.   gabapentin 300 MG capsule Commonly known as:  NEURONTIN Take 2 capsules (600 mg total) by mouth 3 (three) times daily.   tiZANidine 4 MG tablet Commonly known as:  ZANAFLEX Take 1 tablet by mouth 3  times daily as needed for  muscle spasm(s)   traMADol 50 MG tablet Commonly known as:  ULTRAM Take 1 tablet (50 mg total) by mouth 2 (two) times daily as needed. What changed:  reasons to take this   Vitamin D-3 1000 units Caps Take 1,000 Units by mouth daily.      Follow-up Information    Cathlean Cower, MD. Schedule an appointment as soon as possible for a visit in 1 week(s).   Specialties:  Internal Medicine, Radiology Contact information: [place] [address] [phone]          No Known Allergies  Consultations:   None   Other Procedures/Studies: Dg Chest 2 View  Result Date: 01/06/2016 CLINICAL DATA:  Left sided chest pain began last night with SOB and radiation to left arm with nausea and tingling of left hand/fingers. Pt.diagnosed with monoclonal gammopathy of unknown significance in 2013. EXAM: CHEST  2 VIEW COMPARISON:  04/20/2010.  Chest CT, 01/06/2016 FINDINGS: The heart size and mediastinal contours are within normal limits. Both lungs are clear. No pleural effusion or pneumothorax. The visualized skeletal structures are unremarkable. IMPRESSION: No active cardiopulmonary disease. Electronically Signed   By: Lajean Manes M.D.   On: 01/06/2016 18:28   Ct Angio Chest/abd/pel For Dissection W And/or Wo Contrast  Result Date: 01/06/2016 CLINICAL DATA:  Chest  pain beginning last night. Pain extending to the legs. Short of breath with nausea. Hypotension. Evaluate for aortic dissection. EXAM: CT ANGIOGRAPHY CHEST, ABDOMEN AND PELVIS TECHNIQUE: Multidetector CT imaging through the chest, abdomen and pelvis was performed using the standard protocol during bolus administration of intravenous contrast. Multiplanar reconstructed images and MIPs were obtained and reviewed to evaluate the vascular anatomy. CONTRAST:  100 mL of Isovue 370 intravenous contrast COMPARISON:  CTA chest, 11/03/2011 FINDINGS: CTA CHEST FINDINGS Cardiovascular: Thoracic aorta is normal caliber. No dissection. No atherosclerotic plaque. Aortic arch branch vessels are widely patent. Heart is normal in size and configuration. No coronary artery calcifications. Pulmonary arteries are normal in caliber. Mediastinum/Nodes: No enlarged mediastinal, hilar, or axillary lymph nodes. Thyroid gland, trachea, and esophagus demonstrate no significant findings. Lungs/Pleura: There is dependent lower lobe opacity consistent with atelectasis. Calcified granuloma is noted in the left lower lobe. There are 2 6 mm nodules in the right lung, 1 peripherally in the right lower lobe, image 81, series 7 and the other in the right mid lobe posteriorly adjacent to the oblique fissure, also measuring 6 mm. These are both stable from  the prior exam. No convincing pneumonia. No evidence of pulmonary edema. No pleural effusion or pneumothorax. Musculoskeletal: No chest wall abnormality. No acute or significant osseous findings. Review of the MIP images confirms the above findings. CTA ABDOMEN AND PELVIS FINDINGS VASCULAR Aorta: Darrick Grinder do you need me to do the wall normal caliber. No dissection. No atherosclerotic plaque. Celiac: Widely patent.  No plaque. SMA: Partly patent.  No plaque. Renals: Widely patent. No plaque. Small accessory renal artery the right. IMA: Widely patent. Inflow: Normal Veins: Not opacified on this exam. Review  of the MIP images confirms the above findings. NON-VASCULAR Hepatobiliary: 6 mm low-density lesion in the right lobe consistent with cysts. This is stable from the prior CT. No other liver abnormality. Normal gallbladder. No bile duct dilation. Pancreas: Unremarkable. No pancreatic ductal dilatation or surrounding inflammatory changes. Spleen: Normal in size without focal abnormality. Adrenals/Urinary Tract: Normal adrenal glands. 4.5 cm left renal midpole cyst. No other renal masses, no stones and no hydronephrosis. Ureters and bladder are unremarkable. Stomach/Bowel: Moderate distention of the stomach. No stomach wall thickening or inflammatory changes. Normal small bowel. There are few scattered colonic diverticula. No diverticulitis. Colon otherwise unremarkable. Normal appendix visualized. Lymphatic: No enlarged lymph nodes. Reproductive: Unremarkable. Other: No abdominal wall hernia or abnormality. No abdominopelvic ascites. Musculoskeletal: No osteoblastic or osteolytic lesions. Significant degenerative changes, both disc and facet, of the lower lumbar spine. Review of the MIP images confirms the above findings. IMPRESSION: 1. Normal appearance of the thoracoabdominal aorta. No aneurysm. No dissection. No atherosclerotic plaque. 2. Aortic branch vessels are widely patent. 3. There is dependent atelectasis in the lungs. Lungs are otherwise clear. No acute findings on chest CT. 4. No acute abnormalities in the abdomen or pelvis. Electronically Signed   By: Lajean Manes M.D.   On: 01/06/2016 17:27      TODAY-DAY OF DISCHARGE:  Subjective:   Nathan Wallace today has no headache,no chest abdominal pain,no new weakness tingling or numbness, feels much better wants to go home today.   Objective:   Blood pressure 106/85, pulse (!) 55, temperature 98.2 F (36.8 C), temperature source Oral, resp. rate 14, height 5\' 11"  (1.803 m), weight 69.9 kg (154 lb 1.6 oz), SpO2 100 %.  Intake/Output Summary (Last  24 hours) at 01/07/16 1329 Last data filed at 01/07/16 0914  Gross per 24 hour  Intake             3160 ml  Output             2380 ml  Net              780 ml   Filed Weights   01/07/16 0046  Weight: 69.9 kg (154 lb 1.6 oz)    Exam: Awake Alert, Oriented *3, No new F.N deficits, Normal affect [place].AT,PERRAL Supple Neck,No JVD, No cervical lymphadenopathy appriciated.  Symmetrical Chest wall movement, Good air movement bilaterally, CTAB RRR,No Gallops,Rubs or new Murmurs, No Parasternal Heave +ve B.Sounds, Abd Soft, Non tender, No organomegaly appriciated, No rebound -guarding or rigidity. No Cyanosis, Clubbing or edema, No new Rash or bruise   PERTINENT RADIOLOGIC STUDIES: Dg Chest 2 View  Result Date: 01/06/2016 CLINICAL DATA:  Left sided chest pain began last night with SOB and radiation to left arm with nausea and tingling of left hand/fingers. Pt.diagnosed with monoclonal gammopathy of unknown significance in 2013. EXAM: CHEST  2 VIEW COMPARISON:  04/20/2010.  Chest CT, 01/06/2016 FINDINGS: The heart size and mediastinal contours are within  normal limits. Both lungs are clear. No pleural effusion or pneumothorax. The visualized skeletal structures are unremarkable. IMPRESSION: No active cardiopulmonary disease. Electronically Signed   By: Lajean Manes M.D.   On: 01/06/2016 18:28   Ct Angio Chest/abd/pel For Dissection W And/or Wo Contrast  Result Date: 01/06/2016 CLINICAL DATA:  Chest pain beginning last night. Pain extending to the legs. Short of breath with nausea. Hypotension. Evaluate for aortic dissection. EXAM: CT ANGIOGRAPHY CHEST, ABDOMEN AND PELVIS TECHNIQUE: Multidetector CT imaging through the chest, abdomen and pelvis was performed using the standard protocol during bolus administration of intravenous contrast. Multiplanar reconstructed images and MIPs were obtained and reviewed to evaluate the vascular anatomy. CONTRAST:  100 mL of Isovue 370 intravenous contrast  COMPARISON:  CTA chest, 11/03/2011 FINDINGS: CTA CHEST FINDINGS Cardiovascular: Thoracic aorta is normal caliber. No dissection. No atherosclerotic plaque. Aortic arch branch vessels are widely patent. Heart is normal in size and configuration. No coronary artery calcifications. Pulmonary arteries are normal in caliber. Mediastinum/Nodes: No enlarged mediastinal, hilar, or axillary lymph nodes. Thyroid gland, trachea, and esophagus demonstrate no significant findings. Lungs/Pleura: There is dependent lower lobe opacity consistent with atelectasis. Calcified granuloma is noted in the left lower lobe. There are 2 6 mm nodules in the right lung, 1 peripherally in the right lower lobe, image 81, series 7 and the other in the right mid lobe posteriorly adjacent to the oblique fissure, also measuring 6 mm. These are both stable from the prior exam. No convincing pneumonia. No evidence of pulmonary edema. No pleural effusion or pneumothorax. Musculoskeletal: No chest wall abnormality. No acute or significant osseous findings. Review of the MIP images confirms the above findings. CTA ABDOMEN AND PELVIS FINDINGS VASCULAR Aorta: Darrick Grinder do you need me to do the wall normal caliber. No dissection. No atherosclerotic plaque. Celiac: Widely patent.  No plaque. SMA: Partly patent.  No plaque. Renals: Widely patent. No plaque. Small accessory renal artery the right. IMA: Widely patent. Inflow: Normal Veins: Not opacified on this exam. Review of the MIP images confirms the above findings. NON-VASCULAR Hepatobiliary: 6 mm low-density lesion in the right lobe consistent with cysts. This is stable from the prior CT. No other liver abnormality. Normal gallbladder. No bile duct dilation. Pancreas: Unremarkable. No pancreatic ductal dilatation or surrounding inflammatory changes. Spleen: Normal in size without focal abnormality. Adrenals/Urinary Tract: Normal adrenal glands. 4.5 cm left renal midpole cyst. No other renal masses, no stones  and no hydronephrosis. Ureters and bladder are unremarkable. Stomach/Bowel: Moderate distention of the stomach. No stomach wall thickening or inflammatory changes. Normal small bowel. There are few scattered colonic diverticula. No diverticulitis. Colon otherwise unremarkable. Normal appendix visualized. Lymphatic: No enlarged lymph nodes. Reproductive: Unremarkable. Other: No abdominal wall hernia or abnormality. No abdominopelvic ascites. Musculoskeletal: No osteoblastic or osteolytic lesions. Significant degenerative changes, both disc and facet, of the lower lumbar spine. Review of the MIP images confirms the above findings. IMPRESSION: 1. Normal appearance of the thoracoabdominal aorta. No aneurysm. No dissection. No atherosclerotic plaque. 2. Aortic branch vessels are widely patent. 3. There is dependent atelectasis in the lungs. Lungs are otherwise clear. No acute findings on chest CT. 4. No acute abnormalities in the abdomen or pelvis. Electronically Signed   By: Lajean Manes M.D.   On: 01/06/2016 17:27     PERTINENT LAB RESULTS: CBC:  Recent Labs  01/06/16 1630 01/06/16 1651 01/06/16 2342  WBC 15.0*  --  10.0  HGB 12.6* 13.6 12.0*  HCT 37.5* 40.0 36.5*  PLT 309  --  260   CMET CMP     Component Value Date/Time   NA 137 01/07/2016 0051   NA 140 12/29/2015 0954   K 4.1 01/07/2016 0051   K 4.1 12/29/2015 0954   CL 107 01/07/2016 0051   CL 104 06/28/2012 1010   CO2 25 01/07/2016 0051   CO2 22 12/29/2015 0954   GLUCOSE 106 (H) 01/07/2016 0051   GLUCOSE 127 12/29/2015 0954   GLUCOSE 127 (H) 06/28/2012 1010   BUN 20 01/07/2016 0051   BUN 15.0 12/29/2015 0954   CREATININE 1.32 (H) 01/07/2016 0051   CREATININE 1.2 12/29/2015 0954   CALCIUM 8.9 01/07/2016 0051   CALCIUM 9.2 12/29/2015 0954   PROT 7.1 12/29/2015 0954   PROT 7.8 12/29/2015 0954   ALBUMIN 3.7 12/29/2015 0954   AST 20 12/29/2015 0954   ALT 19 12/29/2015 0954   ALKPHOS 73 12/29/2015 0954   BILITOT 0.57  12/29/2015 0954   GFRNONAA 55 (L) 01/07/2016 0051   GFRAA >60 01/07/2016 0051    GFR Estimated Creatinine Clearance: 54.4 mL/min (by C-G formula based on SCr of 1.32 mg/dL (H)). No results for input(s): LIPASE, AMYLASE in the last 72 hours.  Recent Labs  01/06/16 2342  CKTOTAL 333  TROPONINI <0.03   Invalid input(s): POCBNP No results for input(s): DDIMER in the last 72 hours. No results for input(s): HGBA1C in the last 72 hours. No results for input(s): CHOL, HDL, LDLCALC, TRIG, CHOLHDL, LDLDIRECT in the last 72 hours.  Recent Labs  01/07/16 0051  TSH 3.382    Recent Labs  01/06/16 2342  FERRITIN 380*  TIBC 350  IRON 65   Coags: No results for input(s): INR in the last 72 hours.  Invalid input(s): PT Microbiology: No results found for this or any previous visit (from the past 240 hour(s)).  FURTHER DISCHARGE INSTRUCTIONS:  Get Medicines reviewed and adjusted: Please take all your medications with you for your next visit with your Primary MD  Laboratory/radiological data: Please request your Primary MD to go over all hospital tests and procedure/radiological results at the follow up, please ask your Primary MD to get all Hospital records sent to his/her office.  In some cases, they will be blood work, cultures and biopsy results pending at the time of your discharge. Please request that your primary care M.D. goes through all the records of your hospital data and follows up on these results.  Also Note the following: If you experience worsening of your admission symptoms, develop shortness of breath, life threatening emergency, suicidal or homicidal thoughts you must seek medical attention immediately by calling 911 or calling your MD immediately  if symptoms less severe.  You must read complete instructions/literature along with all the possible adverse reactions/side effects for all the Medicines you take and that have been prescribed to you. Take any new  Medicines after you have completely understood and accpet all the possible adverse reactions/side effects.   Do not drive when taking Pain medications or sleeping medications (Benzodaizepines)  Do not take more than prescribed Pain, Sleep and Anxiety Medications. It is not advisable to combine anxiety,sleep and pain medications without talking with your primary care practitioner  Special Instructions: If you have smoked or chewed Tobacco  in the last 2 yrs please stop smoking, stop any regular Alcohol  and or any Recreational drug use.  Wear Seat belts while driving.  Please note: You were cared for by a hospitalist during your hospital stay.  Once you are discharged, your primary care physician will handle any further medical issues. Please note that NO REFILLS for any discharge medications will be authorized once you are discharged, as it is imperative that you return to your primary care physician (or establish a relationship with a primary care physician if you do not have one) for your post hospital discharge needs so that they can reassess your need for medications and monitor your lab values.  Total Time spent coordinating discharge including counseling, education and face to face time equals  45 minutes.  SignedOren Binet 01/07/2016 1:29 PM

## 2016-01-07 NOTE — Progress Notes (Signed)
Has ambulated in the hallway No further leg cramps/chest pain Feels better D/C home See discharge summary for details

## 2016-01-08 ENCOUNTER — Telehealth: Payer: Self-pay | Admitting: *Deleted

## 2016-01-08 NOTE — Telephone Encounter (Signed)
Transition Care Management Follow-up Telephone Call   Date discharged? 01/07/16   How have you been since you were released from the hospital? Pt states he seem to be doing ok   Do you understand why you were in the hospital? YES   Do you understand the discharge instructions? YES   Where were you discharged to? Home   Items Reviewed:  Medications reviewed: YES  Allergies reviewed: YES  Dietary changes reviewed: YES  Referrals reviewed: No referral needed   Functional Questionnaire:   Activities of Daily Living (ADLs):   He states he are independent in the following: ambulation, bathing and hygiene, feeding, continence, grooming, toileting and dressing States he doesn't require assistance with the following:    Any transportation issues/concerns?: YES   Any patient concerns? NO   Confirmed importance and date/time of follow-up visits scheduled YES, 02/14/16  Provider Appointment booked with Dr. Jenny Reichmann   Confirmed with patient if condition begins to worsen call PCP or go to the ER.  Patient was given the office number and encouraged to call back with question or concerns.  : YES

## 2016-01-10 ENCOUNTER — Other Ambulatory Visit: Payer: Self-pay | Admitting: Internal Medicine

## 2016-01-11 LAB — CULTURE, BLOOD (ROUTINE X 2)
Culture: NO GROWTH
Culture: NO GROWTH

## 2016-01-13 NOTE — Progress Notes (Deleted)
[facility] [place], [place] [address] Phone: [phone] Subjective:    I'm seeing this patient by the request  of:  Cathlean Cower, MD   CC: left shoulder pain and neck pain f/u  Bilateral wrist pain f/u  [id number]  Nathan Wallace is a 65 y.o. male coming in with complaint of left shoulder pain and neck pain follow up. Of note patient does have a past medical history significant for C4 through C7 fusion. He was started on gabapentin at last visit and titrated up to 600 mg 3 times a day. Patient states that it helps with the radiation pain. Still has the chronic dull aching sensation.  Patient states he is also had some exacerbation with his wrists. Patient was given a brace for the left wrist and states that this has helped out significantly. Unfortunate having worsening pain on the right wrist. Patient states that as long as he would have a brace he thinks he was doing relatively well. Has had surgery on that side before for a ganglion cyst as well as carpal tunnel. Patient feels like there could be an association with the neck but does not remember exactly  Patient states that he is having more chronic aches and pains all over. Seems to be as knees, thighs, lower back as well.  Previous x-rays in April 2015 showed fusion of C4-C7 and moderate severe degenerative disc disease at C6, C7-T1. Moderate arthritic changes at C2-C3 as well.  Past Medical History:  Diagnosis Date  . Arthritis    RT KNEE AND RT SHOULDER  . Benign head tremor   . CVA (cerebrovascular accident due to intracerebral hemorrhage) ([place]) 08/03/2011  . Degenerative joint disease of knee, right   . Depression 07/26/2012  . GERD (gastroesophageal reflux disease)    NO RECENT PROBLEMS AND NO MEDS  . Hyperlipidemia    border line  . Impaired glucose tolerance 06/22/2011  . Left inguinal hernia   . MGUS (monoclonal gammopathy of unknown significance)   . Prostate cancer ([place])  2011  . Stroke [facility])    2013  . Syncope 2012   after prostate surgery; neg neurology work up.    Past Surgical History:  Procedure Laterality Date  . COLONOSCOPY    . INGUINAL HERNIA REPAIR  06/01/2011   Procedure: HERNIA REPAIR INGUINAL ADULT;  Surgeon: Adin Hector, MD;  Location: [facility];  Service: General;  Laterality: Left;  Left Inguinal Hernia Repair with Mesh  . ROBOT ASSISTED LAPAROSCOPIC RADICAL PROSTATECTOMY  jan 2012   da vinci  . SPINE SURGERY  2003 OR 2004   CERVICAL FUSION-STATES LIMITED ROM NECK  . WRIST SURGERY     Social History  Substance Use Topics  . Smoking status: Former Smoker    Quit date: 06/28/1966  . Smokeless tobacco: Never Used     Comment: QUIT SMOKING IN THE 70'S  . Alcohol use 3.0 oz/week    5 Cans of beer per week     Comment: 5th a liquor a week beer and vodka   No Known Allergies Family History  Problem Relation Age of Onset  . Hypertension Mother   . Aneurysm Mother     brain  . Cancer Father     colon  . Colon cancer Father   . Heart attack Brother         Past medical history, social, surgical and family history all reviewed in electronic medical record.   Review  of Systems: No headache, visual changes, nausea, vomiting, diarrhea, constipation, dizziness, abdominal pain, skin rash, fevers, chills, night sweats, weight loss, swollen lymph nodes, body aches, joint swelling, muscle aches, chest pain, shortness of breath, mood changes.   Objective  There were no vitals taken for this visit.  General: No apparent distress alert and oriented x3 mood and affect normal, dressed appropriately.  HEENT: Pupils equal, extraocular movements intact  Respiratory: Patient's speak in full sentences and does not appear short of breath  Cardiovascular: No lower extremity edema, non tender, no erythema  Skin: Warm dry intact with no signs of infection or rash on extremities or on axial skeleton.  Abdomen: Soft nontender  Neuro: Cranial nerves  II through XII are intact, neurovascularly intact in all extremities with 2+ DTRs and 2+ pulses.  Lymph: No lymphadenopathy of posterior or anterior cervical chain or axillae bilaterally.  Gait normal with good balance and coordination.  MSK:  Non tender with full range of motion and good stability and symmetric strength and tone of  elbows,  hip, knee and ankles bilaterally. Osteoarthritic changes of multiple joints   Wrist:left Mild swelling on the dorsal aspect of the left wrist ROM smooth and normal with good flexion and extension and ulnar/radial deviation that is symmetrical with opposite wrist. Diffuse tenderness of the wrist No snuffbox tenderness. No tenderness over Canal of Guyon. Strength 5/5 in all directions without pain. Negative Finkelstein, tinel's and phalens. Negative Watson's test. Hunter lateral risk unremarkable  Neck: Inspection shows loss of lordosis No palpable stepoffs. Positive Spurling's maneuver noted. Bilaterally Limited range of motion in flexion-extension as well as right side bending secondary to fusion Grip strength and sensation normal in bilateral hands Strength good C4 to T1 distribution, with continued weakness of the T1 distribution compared to the contralateral side No sensory change to C4 to T1 Negative Hoffman sign bilaterally Reflexes normal  Shoulder:bilateral Atrophy of the shoulder girdle musculature bilaterally. Palpation is normal with no tenderness over AC joint or bicipital groove. ROM is full in all planes.mild crepitus with range of motion Rotator cuff strength 4/5 but symmetric signs of impingement with psitiveNeer and Hawkin's tests, empty can sign. Speeds and Yergason's tests normal. positive painful arc bilaterally No apprehension sign   Wrist had generalized tenderness with mild crepitus bilaterally. Grip strength is 4 out of 5 bilaterally. Negative de Quervain's sign with Wynn Maudlin. Negative carpal tunnel signs with  Tinel's.    Impression and Recommendations:     This case required medical decision making of moderate complexity.

## 2016-01-14 ENCOUNTER — Encounter: Payer: Self-pay | Admitting: Internal Medicine

## 2016-01-14 ENCOUNTER — Ambulatory Visit (INDEPENDENT_AMBULATORY_CARE_PROVIDER_SITE_OTHER): Payer: Medicare Other | Admitting: Internal Medicine

## 2016-01-14 ENCOUNTER — Ambulatory Visit: Payer: Medicare Other | Admitting: Family Medicine

## 2016-01-14 ENCOUNTER — Other Ambulatory Visit (INDEPENDENT_AMBULATORY_CARE_PROVIDER_SITE_OTHER): Payer: Medicare Other

## 2016-01-14 VITALS — BP 130/74 | HR 68 | Temp 98.0°F | Resp 20 | Wt 156.4 lb

## 2016-01-14 DIAGNOSIS — E785 Hyperlipidemia, unspecified: Secondary | ICD-10-CM

## 2016-01-14 DIAGNOSIS — N179 Acute kidney failure, unspecified: Secondary | ICD-10-CM | POA: Diagnosis not present

## 2016-01-14 DIAGNOSIS — I10 Essential (primary) hypertension: Secondary | ICD-10-CM | POA: Diagnosis not present

## 2016-01-14 DIAGNOSIS — R300 Dysuria: Secondary | ICD-10-CM | POA: Diagnosis not present

## 2016-01-14 DIAGNOSIS — Z5189 Encounter for other specified aftercare: Secondary | ICD-10-CM

## 2016-01-14 LAB — CBC WITH DIFFERENTIAL/PLATELET
BASOS PCT: 0.5 % (ref 0.0–3.0)
Basophils Absolute: 0 10*3/uL (ref 0.0–0.1)
EOS PCT: 1.3 % (ref 0.0–5.0)
Eosinophils Absolute: 0.1 10*3/uL (ref 0.0–0.7)
HCT: 36 % — ABNORMAL LOW (ref 39.0–52.0)
Hemoglobin: 12.4 g/dL — ABNORMAL LOW (ref 13.0–17.0)
LYMPHS ABS: 2.3 10*3/uL (ref 0.7–4.0)
Lymphocytes Relative: 35.7 % (ref 12.0–46.0)
MCHC: 34.5 g/dL (ref 30.0–36.0)
MCV: 98.7 fl (ref 78.0–100.0)
MONOS PCT: 9.8 % (ref 3.0–12.0)
Monocytes Absolute: 0.6 10*3/uL (ref 0.1–1.0)
NEUTROS ABS: 3.4 10*3/uL (ref 1.4–7.7)
NEUTROS PCT: 52.7 % (ref 43.0–77.0)
PLATELETS: 322 10*3/uL (ref 150.0–400.0)
RBC: 3.65 Mil/uL — ABNORMAL LOW (ref 4.22–5.81)
RDW: 13.7 % (ref 11.5–15.5)
WBC: 6.4 10*3/uL (ref 4.0–10.5)

## 2016-01-14 LAB — BASIC METABOLIC PANEL
BUN: 18 mg/dL (ref 6–23)
CO2: 31 mEq/L (ref 19–32)
CREATININE: 1.03 mg/dL (ref 0.40–1.50)
Calcium: 9.5 mg/dL (ref 8.4–10.5)
Chloride: 104 mEq/L (ref 96–112)
GFR: 92.89 mL/min (ref >60.00)
Glucose, Bld: 99 mg/dL (ref 70–99)
Potassium: 4.6 mEq/L (ref 3.5–5.1)
Sodium: 140 mEq/L (ref 135–145)

## 2016-01-14 NOTE — Assessment & Plan Note (Signed)
Stable today, will cont to hold avapro 300 for now, to cont to monitor BP at home and call in 1-2 wks or after if BP remains consistently > 140/90

## 2016-01-14 NOTE — Assessment & Plan Note (Signed)
Improved, for f/u cbc, bmp today,  to f/u any worsening symptoms or concerns

## 2016-01-14 NOTE — Progress Notes (Signed)
Pre visit review using our clinic review tool, if applicable. No additional management support is needed unless otherwise documented below in the visit note. 

## 2016-01-14 NOTE — Patient Instructions (Addendum)
OK to restart the lipitor  Please hold off on restarting the irbesartan for now  Please check your Blood Pressures at home on a regular basis, and call if it seems the BP is more than 140/90 consistently  Please continue all other medications as before, and refills have been done if requested.  Please have the pharmacy call with any other refills you may need.  Please keep your appointments with your specialists as you may have planned  Please go to the LAB in the Basement (turn left off the elevator) for the tests to be done today  You will be contacted by phone if any changes need to be made immediately.  Otherwise, you will receive a letter about your results with an explanation, but please check with MyChart first.  Please remember to sign up for MyChart if you have not done so, as this will be important to you in the future with finding out test results, communicating by private email, and scheduling acute appointments online when needed.

## 2016-01-14 NOTE — Progress Notes (Signed)
Subjective:    Patient ID: Nathan Wallace, male    DOB: 1950/12/28, 65 y.o.   MRN: [id number]  HPI  Here to f/u hjospn/TCM related ; hospd with AKI and hypotension responsive to IVF, etiology unclear apparently but infection appears ruled out, blood cx proved neg x 5 days. Illness required holding ACE, as well as statin held due to multiple location torso body cramping, but  No further cramps since left hosp.  Has still felt some general weakness, but Denies worsening reflux, abd pain, dysphagia, n/v, bowel change or blood.   Pt denies fever, wt loss, night sweats, loss of appetite, or other constitutional symptoms Started taking ensure for nutrition. Denies urinary symptoms such as dysuria, frequency, urgency, flank pain, hematuria or n/v, fever, chills. Due for lab work today - cbc/bmp. Cont's to be followed closely for MGUS, and recent b12 improved with injecitons.  Pt is due for Nov 2017 bladder sling/prostate procedure for incontinence per urology, has the ability to check BP often at home. Past Medical History:  Diagnosis Date  . Arthritis    RT KNEE AND RT SHOULDER  . Benign head tremor   . CVA (cerebrovascular accident due to intracerebral hemorrhage) ([place]) 08/03/2011  . Degenerative joint disease of knee, right   . Depression 07/26/2012  . GERD (gastroesophageal reflux disease)    NO RECENT PROBLEMS AND NO MEDS  . Hyperlipidemia    border line  . Impaired glucose tolerance 06/22/2011  . Left inguinal hernia   . MGUS (monoclonal gammopathy of unknown significance)   . Prostate cancer ([place]) 2011  . Stroke [facility])    2013  . Syncope 2012   after prostate surgery; neg neurology work up.    Past Surgical History:  Procedure Laterality Date  . COLONOSCOPY    . INGUINAL HERNIA REPAIR  06/01/2011   Procedure: HERNIA REPAIR INGUINAL ADULT;  Surgeon: Adin Hector, MD;  Location: [facility];  Service: General;  Laterality: Left;  Left Inguinal Hernia Repair with Mesh  . ROBOT ASSISTED  LAPAROSCOPIC RADICAL PROSTATECTOMY  jan 2012   da vinci  . SPINE SURGERY  2003 OR 2004   CERVICAL FUSION-STATES LIMITED ROM NECK  . WRIST SURGERY      reports that he quit smoking about 49 years ago. He has never used smokeless tobacco. He reports that he drinks about 3.0 oz of alcohol per week . He reports that he does not use drugs. family history includes Aneurysm in his mother; Cancer in his father; Colon cancer in his father; Heart attack in his brother; Hypertension in his mother. No Known Allergies Current Outpatient Prescriptions on File Prior to Visit  Medication Sig Dispense Refill  . allopurinol (ZYLOPRIM) 300 MG tablet Take 1 tablet (300 mg total) by mouth daily. 90 tablet 1  . amLODipine (NORVASC) 5 MG tablet TAKE 1 TABLET BY MOUTH  DAILY 90 tablet 2  . aspirin 81 MG chewable tablet Chew 81 mg by mouth daily.    . Cholecalciferol (VITAMIN D-3) 1000 units CAPS Take 1,000 Units by mouth daily.    . colchicine 0.6 MG tablet Take 1 tablet (0.6 mg total) by mouth 2 (two) times daily. For 5 days when having flare. 60 tablet 2  . gabapentin (NEURONTIN) 300 MG capsule Take 2 capsules (600 mg total) by mouth 3 (three) times daily. 270 capsule 3  . tiZANidine (ZANAFLEX) 4 MG tablet Take 1 tablet by mouth 3  times daily as needed for  muscle spasm(s) 270  tablet 1  . traMADol (ULTRAM) 50 MG tablet Take 1 tablet (50 mg total) by mouth 2 (two) times daily as needed. (Patient taking differently: Take 50 mg by mouth 2 (two) times daily as needed (for pain). ) 180 tablet 1   No current facility-administered medications on file prior to visit.     Review of Systems  Constitutional: Negative for unusual diaphoresis or night sweats HENT: Negative for ear swelling or discharge Eyes: Negative for worsening visual haziness  Respiratory: Negative for choking and stridor.   Gastrointestinal: Negative for distension or worsening eructation Genitourinary: Negative for retention or change in urine  volume.  Musculoskeletal: Negative for other MSK pain or swelling Skin: Negative for color change and worsening wound Neurological: Negative for tremors and numbness other than noted  Psychiatric/Behavioral: Negative for decreased concentration or agitation other than above       Objective:   Physical Exam BP 130/74   Pulse 68   Temp 98 F (36.7 C) (Oral)   Resp 20   Wt 156 lb 6 oz (70.9 kg)   SpO2 94%   BMI 21.81 kg/m  VS noted,  Constitutional: Pt appears in no apparent distress HENT: Head: NCAT.  Right Ear: External ear normal.  Left Ear: External ear normal.  Eyes: . Pupils are equal, round, and reactive to light. Conjunctivae and EOM are normal Neck: Normal range of motion. Neck supple.  Cardiovascular: Normal rate and regular rhythm.   Pulmonary/Chest: Effort normal and breath sounds without rales or wheezing.  Abd:  Soft, NT, ND, + BS Neurological: Pt is alert. Not confused , motor grossly intact Skin: Skin is warm. No rash, no LE edema Psychiatric: Pt behavior is normal. No agitation.   Blood Culture (routine x 2)  Order: [id number]  Status:  Final result Visible to patient:  Yes (MyChart) Next appt:  01/25/2016 at 09:15 AM in Internal Medicine ([place])   8d ago  Specimen Description BLOOD RIGHT ANTECUBITAL   Special Requests BOTTLES DRAWN AEROBIC AND ANAEROBIC 5 CC   Culture NO GROWTH 5 DAYS   Report Status 01/11/2016 FINAL   Resulting Agency [facility]    Specimen Collected: 01/06/16 18:40 Last Resulted: 01/11/16 15:24             Assessment & Plan:

## 2016-01-14 NOTE — Assessment & Plan Note (Signed)
Ok to restart the statin,  to f/u any worsening symptoms or concerns Lab Results  Component Value Date   LDLCALC 63 09/23/2015

## 2016-01-23 ENCOUNTER — Other Ambulatory Visit: Payer: Self-pay | Admitting: Internal Medicine

## 2016-01-25 ENCOUNTER — Ambulatory Visit: Payer: Medicare Other

## 2016-01-25 NOTE — Telephone Encounter (Signed)
Per  01/14/16 OV note with PCP- advised to hold irbesartan "for now" or unless BP stays elevated. Ok to Rf in PCP's absence?

## 2016-01-26 ENCOUNTER — Ambulatory Visit (INDEPENDENT_AMBULATORY_CARE_PROVIDER_SITE_OTHER): Payer: Medicare Other

## 2016-01-26 DIAGNOSIS — E538 Deficiency of other specified B group vitamins: Secondary | ICD-10-CM

## 2016-01-26 MED ORDER — CYANOCOBALAMIN 1000 MCG/ML IJ SOLN
1000.0000 ug | Freq: Once | INTRAMUSCULAR | Status: AC
  Administered 2016-01-26: 1000 ug via INTRAMUSCULAR

## 2016-01-26 NOTE — Progress Notes (Signed)
b12

## 2016-01-31 NOTE — Progress Notes (Deleted)
[name] [address] [place], [place] [address] Phone: [phone] Subjective:    I'm seeing this patient by the request  of:  Cathlean Cower, MD   CC: left shoulder pain and neck pain f/u  Bilateral wrist pain f/u  [id number]  Nathan Wallace is a 65 y.o. male coming in with complaint of left shoulder pain and neck pain follow up. Of note patient does have a past medical history significant for C4 through C7 fusion. He was started on gabapentin at last visit and titrated up to 600 mg 3 times a day. Patient states that it helps with the radiation pain.    Patient states he is also had some exacerbation with his wrists. Patient was given a brace for the left wrist and states that this has helped out significantly. Unfortunate having worsening pain on the right wrist. Patient states that as long as he would have a brace he thinks he was doing relatively well. Has had surgery on that side before for a ganglion cyst as well as carpal tunnel. Patient feels like there could be an association with the neck but does not remember exactly  Patient states that he is having more chronic aches and pains all over. Seems to be as knees, thighs, lower back as well.  Previous x-rays in April 2015 showed fusion of C4-C7 and moderate severe degenerative disc disease at C6, C7-T1. Moderate arthritic changes at C2-C3 as well.  Past Medical History:  Diagnosis Date  . Arthritis    RT KNEE AND RT SHOULDER  . Benign head tremor   . CVA (cerebrovascular accident due to intracerebral hemorrhage) ([place]) 08/03/2011  . Degenerative joint disease of knee, right   . Depression 07/26/2012  . GERD (gastroesophageal reflux disease)    NO RECENT PROBLEMS AND NO MEDS  . Hyperlipidemia    border line  . Impaired glucose tolerance 06/22/2011  . Left inguinal hernia   . MGUS (monoclonal gammopathy of unknown significance)   . Prostate cancer ([place]) 2011  . Stroke [facility])    2013  . Syncope  2012   after prostate surgery; neg neurology work up.    Past Surgical History:  Procedure Laterality Date  . COLONOSCOPY    . INGUINAL HERNIA REPAIR  06/01/2011   Procedure: HERNIA REPAIR INGUINAL ADULT;  Surgeon: Adin Hector, MD;  Location: [facility];  Service: General;  Laterality: Left;  Left Inguinal Hernia Repair with Mesh  . ROBOT ASSISTED LAPAROSCOPIC RADICAL PROSTATECTOMY  jan 2012   da vinci  . SPINE SURGERY  2003 OR 2004   CERVICAL FUSION-STATES LIMITED ROM NECK  . WRIST SURGERY     Social History  Substance Use Topics  . Smoking status: Former Smoker    Quit date: 06/28/1966  . Smokeless tobacco: Never Used     Comment: QUIT SMOKING IN THE 70'S  . Alcohol use 3.0 oz/week    5 Cans of beer per week     Comment: 5th a liquor a week beer and vodka   No Known Allergies Family History  Problem Relation Age of Onset  . Hypertension Mother   . Aneurysm Mother     brain  . Cancer Father     colon  . Colon cancer Father   . Heart attack Brother         Past medical history, social, surgical and family history all reviewed in electronic medical record.   Review of Systems: No headache, visual  changes, nausea, vomiting, diarrhea, constipation, dizziness, abdominal pain, skin rash, fevers, chills, night sweats, weight loss, swollen lymph nodes, body aches, joint swelling, muscle aches, chest pain, shortness of breath, mood changes.   Objective  There were no vitals taken for this visit.  General: No apparent distress alert and oriented x3 mood and affect normal, dressed appropriately.  HEENT: Pupils equal, extraocular movements intact  Respiratory: Patient's speak in full sentences and does not appear short of breath  Cardiovascular: No lower extremity edema, non tender, no erythema  Skin: Warm dry intact with no signs of infection or rash on extremities or on axial skeleton.  Abdomen: Soft nontender  Neuro: Cranial nerves II through XII are intact, neurovascularly  intact in all extremities with 2+ DTRs and 2+ pulses.  Lymph: No lymphadenopathy of posterior or anterior cervical chain or axillae bilaterally.  Gait normal with good balance and coordination.  MSK:  Non tender with full range of motion and good stability and symmetric strength and tone of  elbows,  hip, knee and ankles bilaterally. Osteoarthritic changes of multiple joints   Wrist:left Mild swelling on the dorsal aspect of the left wrist ROM smooth and normal with good flexion and extension and ulnar/radial deviation that is symmetrical with opposite wrist. Diffuse tenderness of the wrist No snuffbox tenderness. No tenderness over Canal of Guyon. Strength 5/5 in all directions without pain. Negative Finkelstein, tinel's and phalens. Negative Watson's test. Hunter lateral risk unremarkable  Neck: Inspection shows loss of lordosis No palpable stepoffs. Positive Spurling's maneuver noted. Bilaterally Limited range of motion in flexion-extension as well as right side bending secondary to fusion Grip strength and sensation normal in bilateral hands Strength good C4 to T1 distribution, with continued weakness of the T1 distribution compared to the contralateral side No sensory change to C4 to T1 Negative Hoffman sign bilaterally Reflexes normal  Shoulder:bilateral Atrophy of the shoulder girdle musculature bilaterally. Palpation is normal with no tenderness over AC joint or bicipital groove. ROM is full in all planes.mild crepitus with range of motion Rotator cuff strength 4/5 but symmetric signs of impingement with psitiveNeer and Hawkin's tests, empty can sign. Speeds and Yergason's tests normal. positive painful arc bilaterally No apprehension sign   Wrist had generalized tenderness with mild crepitus bilaterally. Grip strength is 4 out of 5 bilaterally. Negative de Quervain's sign with Wynn Maudlin. Negative carpal tunnel signs with Tinel's.    Impression and Recommendations:       This case required medical decision making of moderate complexity.

## 2016-02-01 ENCOUNTER — Ambulatory Visit: Payer: Medicare Other | Admitting: Family Medicine

## 2016-02-04 ENCOUNTER — Other Ambulatory Visit: Payer: Self-pay | Admitting: Urology

## 2016-02-08 NOTE — Progress Notes (Signed)
Please place orders in EPIC as patient has a Pre-op appointment  with the nurse at [facility] on 03/01/16 at 10 am! Thank you!

## 2016-02-12 DIAGNOSIS — C61 Malignant neoplasm of prostate: Secondary | ICD-10-CM | POA: Diagnosis not present

## 2016-02-19 DIAGNOSIS — C61 Malignant neoplasm of prostate: Secondary | ICD-10-CM | POA: Diagnosis not present

## 2016-02-29 ENCOUNTER — Ambulatory Visit (INDEPENDENT_AMBULATORY_CARE_PROVIDER_SITE_OTHER): Payer: Medicare Other

## 2016-02-29 DIAGNOSIS — E538 Deficiency of other specified B group vitamins: Secondary | ICD-10-CM | POA: Diagnosis not present

## 2016-02-29 MED ORDER — CYANOCOBALAMIN 1000 MCG/ML IJ SOLN
1000.0000 ug | Freq: Once | INTRAMUSCULAR | Status: AC
  Administered 2016-02-29: 1000 ug via INTRAMUSCULAR

## 2016-02-29 NOTE — Patient Instructions (Signed)
Nathan Wallace  02/29/2016   Your procedure is scheduled on: 03/08/16    Report to [facility] Main  Entrance take [place]  elevators to 3rd floor to  [place] at   [place] AM.  Call this number if you have problems the morning of surgery [phone]   Remember: ONLY 1 PERSON MAY GO WITH YOU TO SHORT STAY TO GET  READY MORNING OF [place].  Do not eat food or drink liquids :After Midnight.     Take these medicines the morning of surgery with A SIP OF WATER: Allopurinol ( zyloprim), amlodipine ( Norvasc)                                You may not have any metal on your body including hair pins and              piercings  Do not wear jewelry, , lotions, powders or perfumes, deodorant                         Men may shave face and neck.   Do not bring valuables to the hospital. [place].  Contacts, dentures or bridgework may not be worn into surgery.  Leave suitcase in the car. After surgery it may be brought to your room.         Special Instructions: N/A              Please read over the following fact sheets you were given: _____________________________________________________________________             [facility] - Preparing for Surgery Before surgery, you can play an important role.  Because skin is not sterile, your skin needs to be as free of germs as possible.  You can reduce the number of germs on your skin by washing with CHG (chlorahexidine gluconate) soap before surgery.  CHG is an antiseptic cleaner which kills germs and bonds with the skin to continue killing germs even after washing. Please DO NOT use if you have an allergy to CHG or antibacterial soaps.  If your skin becomes reddened/irritated stop using the CHG and inform your nurse when you arrive at Short Stay. Do not shave (including legs and underarms) for at least 48 hours prior to the first CHG shower.  You may shave your  face/neck. Please follow these instructions carefully:  1.  Shower with CHG Soap the night before surgery and the  morning of Surgery.  2.  If you choose to wash your hair, wash your hair first as usual with your  normal  shampoo.  3.  After you shampoo, rinse your hair and body thoroughly to remove the  shampoo.                           4.  Use CHG as you would any other liquid soap.  You can apply chg directly  to the skin and wash                       Gently with a scrungie or clean washcloth.  5.  Apply the CHG Soap to  your body ONLY FROM THE NECK DOWN.   Do not use on face/ open                           Wound or open sores. Avoid contact with eyes, ears mouth and genitals (private parts).                       Wash face,  Genitals (private parts) with your normal soap.             6.  Wash thoroughly, paying special attention to the area where your surgery  will be performed.  7.  Thoroughly rinse your body with warm water from the neck down.  8.  DO NOT shower/wash with your normal soap after using and rinsing off  the CHG Soap.                9.  Pat yourself dry with a clean towel.            10.  Wear clean pajamas.            11.  Place clean sheets on your bed the night of your first shower and do not  sleep with pets. Day of Surgery : Do not apply any lotions/deodorants the morning of surgery.  Please wear clean clothes to the hospital/surgery center.  FAILURE TO FOLLOW THESE INSTRUCTIONS MAY RESULT IN THE CANCELLATION OF YOUR SURGERY PATIENT SIGNATURE_________________________________  NURSE SIGNATURE__________________________________  ________________________________________________________________________  WHAT IS A BLOOD TRANSFUSION? Blood Transfusion Information  A transfusion is the replacement of blood or some of its parts. Blood is made up of multiple cells which provide different functions.  Red blood cells carry oxygen and are used for blood loss  replacement.  White blood cells fight against infection.  Platelets control bleeding.  Plasma helps clot blood.  Other blood products are available for specialized needs, such as hemophilia or other clotting disorders. BEFORE THE TRANSFUSION  Who gives blood for transfusions?   Healthy volunteers who are fully evaluated to make sure their blood is safe. This is blood bank blood. Transfusion therapy is the safest it has ever been in the practice of medicine. Before blood is taken from a donor, a complete history is taken to make sure that person has no history of diseases nor engages in risky social behavior (examples are intravenous drug use or sexual activity with multiple partners). The donor's travel history is screened to minimize risk of transmitting infections, such as malaria. The donated blood is tested for signs of infectious diseases, such as HIV and hepatitis. The blood is then tested to be sure it is compatible with you in order to minimize the chance of a transfusion reaction. If you or a relative donates blood, this is often done in anticipation of surgery and is not appropriate for emergency situations. It takes many days to process the donated blood. RISKS AND COMPLICATIONS Although transfusion therapy is very safe and saves many lives, the main dangers of transfusion include:   Getting an infectious disease.  Developing a transfusion reaction. This is an allergic reaction to something in the blood you were given. Every precaution is taken to prevent this. The decision to have a blood transfusion has been considered carefully by your caregiver before blood is given. Blood is not given unless the benefits outweigh the risks. AFTER THE TRANSFUSION  Right after receiving a blood transfusion, you will usually  feel much better and more energetic. This is especially true if your red blood cells have gotten low (anemic). The transfusion raises the level of the red blood cells which  carry oxygen, and this usually causes an energy increase.  The nurse administering the transfusion will monitor you carefully for complications. HOME CARE INSTRUCTIONS  No special instructions are needed after a transfusion. You may find your energy is better. Speak with your caregiver about any limitations on activity for underlying diseases you may have. SEEK MEDICAL CARE IF:   Your condition is not improving after your transfusion.  You develop redness or irritation at the intravenous (IV) site. SEEK IMMEDIATE MEDICAL CARE IF:  Any of the following symptoms occur over the next 12 hours:  Shaking chills.  You have a temperature by mouth above 102 F (38.9 C), not controlled by medicine.  Chest, back, or muscle pain.  People around you feel you are not acting correctly or are confused.  Shortness of breath or difficulty breathing.  Dizziness and fainting.  You get a rash or develop hives.  You have a decrease in urine output.  Your urine turns a dark color or changes to pink, red, or brown. Any of the following symptoms occur over the next 10 days:  You have a temperature by mouth above 102 F (38.9 C), not controlled by medicine.  Shortness of breath.  Weakness after normal activity.  The white part of the eye turns yellow (jaundice).  You have a decrease in the amount of urine or are urinating less often.  Your urine turns a dark color or changes to pink, red, or brown. Document Released: 03/25/2000 Document Revised: 06/20/2011 Document Reviewed: 11/12/2007 [facility] Patient Information 2014 [place], [place].  _______________________________________________________________________

## 2016-03-02 ENCOUNTER — Encounter (HOSPITAL_COMMUNITY)
Admission: RE | Admit: 2016-03-02 | Discharge: 2016-03-02 | Disposition: A | Discharge status: Home / Self Care | Payer: Medicare Other | Source: Ambulatory Visit | Attending: Urology | Admitting: Urology

## 2016-03-02 ENCOUNTER — Encounter (HOSPITAL_COMMUNITY): Payer: Self-pay

## 2016-03-02 DIAGNOSIS — I10 Essential (primary) hypertension: Secondary | ICD-10-CM | POA: Diagnosis not present

## 2016-03-02 DIAGNOSIS — M545 Low back pain: Secondary | ICD-10-CM | POA: Diagnosis not present

## 2016-03-02 DIAGNOSIS — J309 Allergic rhinitis, unspecified: Secondary | ICD-10-CM | POA: Insufficient documentation

## 2016-03-02 DIAGNOSIS — K219 Gastro-esophageal reflux disease without esophagitis: Secondary | ICD-10-CM | POA: Diagnosis not present

## 2016-03-02 DIAGNOSIS — M109 Gout, unspecified: Secondary | ICD-10-CM | POA: Diagnosis not present

## 2016-03-02 DIAGNOSIS — M7541 Impingement syndrome of right shoulder: Secondary | ICD-10-CM | POA: Insufficient documentation

## 2016-03-02 DIAGNOSIS — E785 Hyperlipidemia, unspecified: Secondary | ICD-10-CM | POA: Insufficient documentation

## 2016-03-02 DIAGNOSIS — N179 Acute kidney failure, unspecified: Secondary | ICD-10-CM | POA: Diagnosis not present

## 2016-03-02 DIAGNOSIS — Z01812 Encounter for preprocedural laboratory examination: Secondary | ICD-10-CM | POA: Diagnosis not present

## 2016-03-02 DIAGNOSIS — R7302 Impaired glucose tolerance (oral): Secondary | ICD-10-CM | POA: Diagnosis not present

## 2016-03-02 DIAGNOSIS — F528 Other sexual dysfunction not due to a substance or known physiological condition: Secondary | ICD-10-CM | POA: Diagnosis not present

## 2016-03-02 DIAGNOSIS — K5730 Diverticulosis of large intestine without perforation or abscess without bleeding: Secondary | ICD-10-CM | POA: Insufficient documentation

## 2016-03-02 DIAGNOSIS — E872 Acidosis: Secondary | ICD-10-CM | POA: Diagnosis not present

## 2016-03-02 DIAGNOSIS — M1711 Unilateral primary osteoarthritis, right knee: Secondary | ICD-10-CM | POA: Diagnosis not present

## 2016-03-02 DIAGNOSIS — Z8546 Personal history of malignant neoplasm of prostate: Secondary | ICD-10-CM | POA: Insufficient documentation

## 2016-03-02 HISTORY — DX: Personal history of urinary (tract) infections: Z87.440

## 2016-03-02 HISTORY — DX: Acute kidney failure, unspecified: N17.9

## 2016-03-02 HISTORY — DX: Stress incontinence (female) (male): N39.3

## 2016-03-02 HISTORY — DX: Other chest pain: R07.89

## 2016-03-02 HISTORY — DX: Essential (primary) hypertension: I10

## 2016-03-02 HISTORY — DX: Deficiency of other specified B group vitamins: E53.8

## 2016-03-02 HISTORY — DX: Sepsis, unspecified organism: A41.9

## 2016-03-02 HISTORY — DX: Personal history of other medical treatment: Z92.89

## 2016-03-02 LAB — CBC
HCT: 41.9 % (ref 39.0–52.0)
Hemoglobin: 13.9 g/dL (ref 13.0–17.0)
MCH: 33.4 pg (ref 26.0–34.0)
MCHC: 33.2 g/dL (ref 30.0–36.0)
MCV: 100.7 fL — ABNORMAL HIGH (ref 78.0–100.0)
Platelets: 269 10*3/uL (ref 150–400)
RBC: 4.16 MIL/uL — ABNORMAL LOW (ref 4.22–5.81)
RDW: 13 % (ref 11.5–15.5)
WBC: 7 10*3/uL (ref 4.0–10.5)

## 2016-03-02 LAB — COMPREHENSIVE METABOLIC PANEL
ALT: 31 U/L (ref 17–63)
AST: 29 U/L (ref 15–41)
Albumin: 4.7 g/dL (ref 3.5–5.0)
Alkaline Phosphatase: 59 U/L (ref 38–126)
Anion gap: 5 (ref 5–15)
BILIRUBIN TOTAL: 1.2 mg/dL (ref 0.3–1.2)
BUN: 13 mg/dL (ref 6–20)
CHLORIDE: 106 mmol/L (ref 101–111)
CO2: 29 mmol/L (ref 22–32)
CREATININE: 1.17 mg/dL (ref 0.61–1.24)
Calcium: 9.5 mg/dL (ref 8.9–10.3)
Glucose, Bld: 117 mg/dL — ABNORMAL HIGH (ref 65–99)
Potassium: 4.7 mmol/L (ref 3.5–5.1)
Sodium: 140 mmol/L (ref 135–145)
TOTAL PROTEIN: 8 g/dL (ref 6.5–8.1)

## 2016-03-02 LAB — APTT: aPTT: 25 seconds (ref 24–36)

## 2016-03-02 LAB — PROTIME-INR
INR: 1.12
PROTHROMBIN TIME: 14.4 s (ref 11.4–15.2)

## 2016-03-02 NOTE — Patient Instructions (Addendum)
Nathan Wallace  03/02/2016   Your procedure is scheduled on: Tuesday March 08, 2016  Report to [facility] Main  Entrance take [place]  elevators to 3rd floor to  [place] at 5:30 AM.  Call this number if you have problems the morning of surgery [phone]   Remember: ONLY 1 PERSON MAY GO WITH YOU TO SHORT STAY TO GET  READY MORNING OF [name].  Do not eat food or drink liquids :After Midnight.     Take these medicines the morning of surgery with A SIP OF WATER: Gabapentin; Allopurinol; Amlodipine                                 You may not have any metal on your body including hair pins and              piercings  Do not wear jewelry, , lotions, powders or colognes, deodorant              Men may shave face and neck.   Do not bring valuables to the hospital. [name].  Contacts, dentures or bridgework may not be worn into surgery.  Leave suitcase in the car. After surgery it may be brought to your room.   .               Please read over the following fact sheets you were given:Blood Transfusion Information Sheet _____________________________________________________________________             [facility] - Preparing for Surgery Before surgery, you can play an important role.  Because skin is not sterile, your skin needs to be as free of germs as possible.  You can reduce the number of germs on your skin by washing with CHG (chlorahexidine gluconate) soap before surgery.  CHG is an antiseptic cleaner which kills germs and bonds with the skin to continue killing germs even after washing. Please DO NOT use if you have an allergy to CHG or antibacterial soaps.  If your skin becomes reddened/irritated stop using the CHG and inform your nurse when you arrive at Short Stay. Do not shave (including legs and underarms) for at least 48 hours prior to the first CHG shower.  You may shave your  face/neck. Please follow these instructions carefully:  1.  Shower with CHG Soap the night before surgery and the  morning of Surgery.  2.  If you choose to wash your hair, wash your hair first as usual with your  normal  shampoo.  3.  After you shampoo, rinse your hair and body thoroughly to remove the  shampoo.                           4.  Use CHG as you would any other liquid soap.  You can apply chg directly  to the skin and wash                       Gently with a scrungie or clean washcloth.  5.  Apply the CHG Soap to your body ONLY FROM THE NECK DOWN.   Do not use on face/ open  Wound or open sores. Avoid contact with eyes, ears mouth and genitals (private parts).                       Wash face,  Genitals (private parts) with your normal soap.             6.  Wash thoroughly, paying special attention to the area where your surgery  will be performed.  7.  Thoroughly rinse your body with warm water from the neck down.  8.  DO NOT shower/wash with your normal soap after using and rinsing off  the CHG Soap.                9.  Pat yourself dry with a clean towel.            10.  Wear clean pajamas.            11.  Place clean sheets on your bed the night of your first shower and do not  sleep with pets. Day of Surgery : Do not apply any lotions/deodorants the morning of surgery.  Please wear clean clothes to the hospital/surgery center.  FAILURE TO FOLLOW THESE INSTRUCTIONS MAY RESULT IN THE CANCELLATION OF YOUR SURGERY PATIENT SIGNATURE_________________________________  NURSE SIGNATURE__________________________________  ________________________________________________________________________  WHAT IS A BLOOD TRANSFUSION? Blood Transfusion Information  A transfusion is the replacement of blood or some of its parts. Blood is made up of multiple cells which provide different functions.  Red blood cells carry oxygen and are used for blood loss  replacement.  White blood cells fight against infection.  Platelets control bleeding.  Plasma helps clot blood.  Other blood products are available for specialized needs, such as hemophilia or other clotting disorders. BEFORE THE TRANSFUSION  Who gives blood for transfusions?   Healthy volunteers who are fully evaluated to make sure their blood is safe. This is blood bank blood. Transfusion therapy is the safest it has ever been in the practice of medicine. Before blood is taken from a donor, a complete history is taken to make sure that person has no history of diseases nor engages in risky social behavior (examples are intravenous drug use or sexual activity with multiple partners). The donor's travel history is screened to minimize risk of transmitting infections, such as malaria. The donated blood is tested for signs of infectious diseases, such as HIV and hepatitis. The blood is then tested to be sure it is compatible with you in order to minimize the chance of a transfusion reaction. If you or a relative donates blood, this is often done in anticipation of surgery and is not appropriate for emergency situations. It takes many days to process the donated blood. RISKS AND COMPLICATIONS Although transfusion therapy is very safe and saves many lives, the main dangers of transfusion include:   Getting an infectious disease.  Developing a transfusion reaction. This is an allergic reaction to something in the blood you were given. Every precaution is taken to prevent this. The decision to have a blood transfusion has been considered carefully by your caregiver before blood is given. Blood is not given unless the benefits outweigh the risks. AFTER THE TRANSFUSION  Right after receiving a blood transfusion, you will usually feel much better and more energetic. This is especially true if your red blood cells have gotten low (anemic). The transfusion raises the level of the red blood cells which  carry oxygen, and this usually causes an energy increase.  The nurse administering the transfusion will monitor you carefully for complications. HOME CARE INSTRUCTIONS  No special instructions are needed after a transfusion. You may find your energy is better. Speak with your caregiver about any limitations on activity for underlying diseases you may have. SEEK MEDICAL CARE IF:   Your condition is not improving after your transfusion.  You develop redness or irritation at the intravenous (IV) site. SEEK IMMEDIATE MEDICAL CARE IF:  Any of the following symptoms occur over the next 12 hours:  Shaking chills.  You have a temperature by mouth above 102 F (38.9 C), not controlled by medicine.  Chest, back, or muscle pain.  People around you feel you are not acting correctly or are confused.  Shortness of breath or difficulty breathing.  Dizziness and fainting.  You get a rash or develop hives.  You have a decrease in urine output.  Your urine turns a dark color or changes to pink, red, or brown. Any of the following symptoms occur over the next 10 days:  You have a temperature by mouth above 102 F (38.9 C), not controlled by medicine.  Shortness of breath.  Weakness after normal activity.  The white part of the eye turns yellow (jaundice).  You have a decrease in the amount of urine or are urinating less often.  Your urine turns a dark color or changes to pink, red, or brown. Document Released: 03/25/2000 Document Revised: 06/20/2011 Document Reviewed: 11/12/2007 [facility] Patient Information 2014 [place], [place].  _______________________________________________________________________

## 2016-03-07 NOTE — H&P (Signed)
CC/HPI: I was consulted by Dr Alinda Money regarding Nathan Wallace urinary incontinence that began following his radical prostatectomy a few years ago. He does not leak with coughing or sneezing but he leaks more with activity and bending. He can wear 1 to 3 pads a day. Sometimes they are soaked and at other times much less so. When he drinks beer he can have urge incontinence. His urge incontinence is intermittent. He denies enuresis.   He voids every 4 hours and gets up 2 to 3 times a night and reports a good flow.   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 His leak point pressure was 62 to 96 cmH2O with mild leakage, and he was triggering instability. He did not have stress incontinence with the urodynamics line in place generating pressures as high as 111 cmH2O. He failed VESIcare, and he is on a trial of Toviaz and Myrbetriq.   Medications failed. The relationship of incontinence and leakage without awareness associated with alcohol reviewed again and persists.   Watchful waiting versus a male sling versus mentioning an artificial sphincter.  We talked about a sling in detail. Pros, cons, general surgical and anesthetic risks, and other options including behavioral therapy and watchful waiting were discussed. She understands that slings are generally successful in 90% of cases for stress incontinence, 50% for urge incontinence, and that in a small percentage of cases the incontinence can worsen. The risk of persistent, de novo, or worsening incontinence/dysfunction was discussed. Risks were described but not limited to the discussion of injury to neighboring structures including the bowel (with possible life-threatening sepsis and colostomy), bladder, urethra, vagina (all resulting in further surgery), and ureter (resulting in re-implantation). We also talked about the risk of retention requiring urethrolysis, extrusion  requiring revision, and erosion resulting in further surgery. Bleeding risks and transfusion rates and the risk of infection were discussed. The risk of pelvic and abdominal pain syndromes, dyspareunia, and neuropathies were discussed. The need for CIC was described as well as the usual postoperative course. The patient understands that she might not reach her treatment goal and that she might be worse following surgery. Mesh TV issues were discussed.    Persistent or worsening of incontinence, especially with overactive bladder and upregulation discussed.   Handouts given. I still think if he is going to treat the outlet, the male sling would be a better choice. He will call if he would like to proceed.   today   The patient elected to proceed with a male sling in November. Last week and little bit of burning and possibly cloudy urine with mild increased frequency at night  the patient is here for cystoscopy to rule out a foreign body'   Cystoscopy was normal with no foreign body  The patient elected to proceed with a male sling. He went to the emergency room for perhaps a drop in blood pressure but nothing was found. Frequency and incontinence are stable   postoperative pain was discussed. Again I'm concerned about his overactive bladder. His incontinence has worsened and certainly alcohol could continue continue to trigger for instance overactivity not reach the patient's treatment goal. Having said that overlie think his choice is a very reasonable one.     ALLERGIES: No Allergies    MEDICATIONS: Allopurinol  Aspirin 81 MG TABS Oral  Atorvastatin Calcium 40 MG Oral Tablet Oral  Gabapentin 300 MG Oral Capsule Oral  Irbesartan 300 MG  Oral Tablet Oral  Trimix (PGE 40, PAP 30, PHE 0.5) 0  Trimix (PGE, PAP, PHE) 0  Vitamin B12  Vitamin D TABS Oral     GU PSH: No GU PSH      PSH Notes: Inguinal Hernia Repair, Prostatectomy Robotic-Assisted, Spine Repair, Wrist Surgery   NON-GU PSH:  No Non-GU PSH    GU PMH: Dysuria - 12/23/2015 Mixed incontinence, Urge and stress incontinence - 09/03/2015 Urinary Frequency, Increased urinary frequency - 09/03/2015 ED, arterial insufficiency, Erectile dysfunction due to arterial insufficiency - 07/16/2015 Prostate Cancer, Prostate cancer - 07/16/2015 Stress Incontinence, M/F, Male stress incontinence - 07/16/2015 Unil Inguinal Hernia W/O obst or gang,non-recurrent, Left Inguinal Hernia - 2014      PMH Notes:  2010-01-19 09:44:53 - Note: Arthritis   NON-GU PMH: Muscle weakness (generalized), Muscle weakness - 2016 Other lack of coordination, Muscular incoordination - 2016 Encounter for general adult medical examination without abnormal findings, Encounter for preventive health examination - 2016 Personal history of other diseases of the digestive system, History of esophageal reflux - 2014 Personal history of other endocrine, nutritional and metabolic disease, History of hypercholesterolemia - 2014    FAMILY HISTORY: Hypertension - Mother Prostate Cancer - Father   SOCIAL HISTORY: Marital Status: Married Current Smoking Status: Patient does not smoke anymore. Has not smoked since 12/11/1970.  Types of alcohol consumed: Beer, Liquor. Social Drinker.  Does not drink caffeine. Patient's occupation is/was Retired.    REVIEW OF SYSTEMS:    GU Review Male:   Patient denies frequent urination, hard to postpone urination, burning/ pain with urination, get up at night to urinate, leakage of urine, stream starts and stops, trouble starting your stream, have to strain to urinate , erection problems, and penile pain.  Gastrointestinal (Upper):   Patient denies nausea, vomiting, and indigestion/ heartburn.  Gastrointestinal (Lower):   Patient denies diarrhea and constipation.  Constitutional:   Patient denies fever, night sweats, weight loss, and fatigue.  Skin:   Patient denies skin rash/ lesion and itching.  Eyes:   Patient denies blurred vision  and double vision.  Ears/ Nose/ Throat:   Patient denies sore throat and sinus problems.  Hematologic/Lymphatic:   Patient denies swollen glands and easy bruising.  Cardiovascular:   Patient denies leg swelling and chest pains.  Respiratory:   Patient denies cough and shortness of breath.  Endocrine:   Patient denies excessive thirst.  Musculoskeletal:   Patient denies back pain and joint pain.  Neurological:   Patient denies headaches and dizziness.  Psychologic:   Patient denies depression and anxiety.   VITAL SIGNS: None   PAST DATA REVIEWED:  Source Of History:  Patient   07/23/15 01/02/15 06/07/14 12/18/13 06/14/13 09/28/12 03/21/12 09/22/11  PSA  Total PSA 0.09  0.08  0.07  0.08  0.08  0.07  0.04  0.05     PROCEDURES:         Flexible Cystoscopy - 52000  Risks, benefits, and some of the potential complications of the procedure were discussed at length with the patient. All questions were answered. Informed consent was obtained. Sterile technique and intraurethral analgesia were used.  Bladder Neck:  the penile and bulbar urethra normal. Membranous urethra was normal. There is no bladder neck contracture. There is no foreign body. Trigone was normal. There is no cystitis. There were no bladder abnormalities      The lower urinary tract was carefully examined. The procedure was well-tolerated and without complications. Instructions were given to call the  office immediately for bloody urine, difficulty urinating, urinary retention, painful or frequent urination, fever, chills, nausea, vomiting or other illness. The patient stated that he understood these instructions and would comply with them.   ASSESSMENT:      ICD-10 Details  1 GU:   Dysuria - R30.0   2   Stress Incontinence, M/F - N39.3      PLAN:            Medications New Meds: Cipro 250 mg tablet 1 tablet PO BID   #14  0 Refill(s)            Schedule Return Visit: ASAP - Schedule Surgery   After a thorough review  of the management options for the patient's condition the patient  elected to proceed with surgical therapy as noted above. We have discussed the potential benefits and risks of the procedure, side effects of the proposed treatment, the likelihood of the patient achieving the goals of the procedure, and any potential problems that might occur during the procedure or recuperation. Informed consent has been obtained.

## 2016-03-07 NOTE — Anesthesia Preprocedure Evaluation (Addendum)
Anesthesia Evaluation  Patient identified by MRN, date of birth, ID band Patient awake    Reviewed: Allergy & Precautions, H&P , NPO status , Patient's Chart, lab work & pertinent test results  Airway Mallampati: II  TM Distance: >3 FB Neck ROM: Full    Dental no notable dental hx. (+) Teeth Intact, Dental Advisory Given, Chipped   Pulmonary neg pulmonary ROS, former smoker,    Pulmonary exam normal breath sounds clear to auscultation       Cardiovascular hypertension, Pt. on medications  Rhythm:Regular Rate:Normal     Neuro/Psych Depression CVA, No Residual Symptoms    GI/Hepatic Neg liver ROS, GERD  Controlled,  Endo/Other  negative endocrine ROS  Renal/GU Renal disease   Stress incontinence    Musculoskeletal  (+) Arthritis , Osteoarthritis,    Abdominal   Peds  Hematology negative hematology ROS (+) anemia ,   Anesthesia Other Findings   Reproductive/Obstetrics negative OB ROS                           Anesthesia Physical Anesthesia Plan  ASA: III  Anesthesia Plan: General   Post-op Pain Management:    Induction: Intravenous  Airway Management Planned: LMA and Oral ETT  Additional Equipment:   Intra-op Plan:   Post-operative Plan: Extubation in OR  Informed Consent: I have reviewed the patients History and Physical, chart, labs and discussed the procedure including the risks, benefits and alternatives for the proposed anesthesia with the patient or authorized representative who has indicated his/her understanding and acceptance.   Dental advisory given  Plan Discussed with: CRNA  Anesthesia Plan Comments:         Anesthesia Quick Evaluation

## 2016-03-08 ENCOUNTER — Ambulatory Visit (HOSPITAL_COMMUNITY): Payer: Medicare Other | Admitting: Anesthesiology

## 2016-03-08 ENCOUNTER — Observation Stay (HOSPITAL_COMMUNITY)
Admission: RE | Admit: 2016-03-08 | Discharge: 2016-03-09 | Disposition: A | Discharge status: Home / Self Care | Payer: Medicare Other | Source: Ambulatory Visit | Attending: Urology | Admitting: Urology

## 2016-03-08 ENCOUNTER — Encounter (HOSPITAL_COMMUNITY)
Admission: RE | Disposition: A | Discharge status: Home / Self Care | Payer: Self-pay | Source: Ambulatory Visit | Attending: Urology

## 2016-03-08 ENCOUNTER — Encounter (HOSPITAL_COMMUNITY): Payer: Self-pay | Admitting: *Deleted

## 2016-03-08 DIAGNOSIS — C61 Malignant neoplasm of prostate: Secondary | ICD-10-CM | POA: Diagnosis not present

## 2016-03-08 DIAGNOSIS — D630 Anemia in neoplastic disease: Secondary | ICD-10-CM | POA: Insufficient documentation

## 2016-03-08 DIAGNOSIS — N393 Stress incontinence (female) (male): Secondary | ICD-10-CM | POA: Diagnosis present

## 2016-03-08 DIAGNOSIS — Z8673 Personal history of transient ischemic attack (TIA), and cerebral infarction without residual deficits: Secondary | ICD-10-CM | POA: Diagnosis not present

## 2016-03-08 DIAGNOSIS — N3281 Overactive bladder: Secondary | ICD-10-CM | POA: Diagnosis not present

## 2016-03-08 DIAGNOSIS — N529 Male erectile dysfunction, unspecified: Secondary | ICD-10-CM | POA: Insufficient documentation

## 2016-03-08 DIAGNOSIS — M62838 Other muscle spasm: Secondary | ICD-10-CM | POA: Diagnosis not present

## 2016-03-08 DIAGNOSIS — Z87891 Personal history of nicotine dependence: Secondary | ICD-10-CM | POA: Diagnosis not present

## 2016-03-08 DIAGNOSIS — M1990 Unspecified osteoarthritis, unspecified site: Secondary | ICD-10-CM | POA: Insufficient documentation

## 2016-03-08 DIAGNOSIS — Z7982 Long term (current) use of aspirin: Secondary | ICD-10-CM | POA: Diagnosis not present

## 2016-03-08 DIAGNOSIS — I10 Essential (primary) hypertension: Secondary | ICD-10-CM | POA: Diagnosis not present

## 2016-03-08 DIAGNOSIS — F329 Major depressive disorder, single episode, unspecified: Secondary | ICD-10-CM | POA: Diagnosis not present

## 2016-03-08 DIAGNOSIS — R300 Dysuria: Secondary | ICD-10-CM | POA: Diagnosis not present

## 2016-03-08 DIAGNOSIS — E785 Hyperlipidemia, unspecified: Secondary | ICD-10-CM | POA: Diagnosis not present

## 2016-03-08 HISTORY — PX: URETHRAL SLING: SHX2621

## 2016-03-08 LAB — TYPE AND SCREEN
ABO/RH(D): O POS
ANTIBODY SCREEN: NEGATIVE

## 2016-03-08 SURGERY — CREATION, URETHRAL SLING, MALE
Anesthesia: General

## 2016-03-08 MED ORDER — STERILE WATER FOR IRRIGATION IR SOLN
Status: DC | PRN
  Administered 2016-03-08: 3000 mL

## 2016-03-08 MED ORDER — GABAPENTIN 300 MG PO CAPS
600.0000 mg | ORAL_CAPSULE | Freq: Two times a day (BID) | ORAL | Status: DC
  Administered 2016-03-08 – 2016-03-09 (×2): 600 mg via ORAL
  Filled 2016-03-08 (×2): qty 2

## 2016-03-08 MED ORDER — DIPHENHYDRAMINE HCL 50 MG/ML IJ SOLN: 12.5000 mg | Freq: Four times a day (QID) | INTRAMUSCULAR | Status: DC | PRN

## 2016-03-08 MED ORDER — PROPOFOL 10 MG/ML IV BOLUS
INTRAVENOUS | Status: DC | PRN
  Administered 2016-03-08: 130 mg via INTRAVENOUS

## 2016-03-08 MED ORDER — HYDROMORPHONE HCL 1 MG/ML IJ SOLN
0.2500 mg | INTRAMUSCULAR | Status: DC | PRN
  Administered 2016-03-08 (×2): 0.5 mg via INTRAVENOUS

## 2016-03-08 MED ORDER — LACTATED RINGERS IV SOLN: INTRAVENOUS | Status: DC

## 2016-03-08 MED ORDER — DOCUSATE SODIUM 100 MG PO CAPS
100.0000 mg | ORAL_CAPSULE | Freq: Two times a day (BID) | ORAL | Status: DC
  Administered 2016-03-08 – 2016-03-09 (×3): 100 mg via ORAL
  Filled 2016-03-08 (×3): qty 1

## 2016-03-08 MED ORDER — FENTANYL CITRATE (PF) 100 MCG/2ML IJ SOLN
INTRAMUSCULAR | Status: DC | PRN
  Administered 2016-03-08 (×2): 50 ug via INTRAVENOUS

## 2016-03-08 MED ORDER — HYDROMORPHONE HCL 1 MG/ML IJ SOLN
INTRAMUSCULAR | Status: AC
  Filled 2016-03-08: qty 1

## 2016-03-08 MED ORDER — IRBESARTAN 75 MG PO TABS
300.0000 mg | ORAL_TABLET | Freq: Every day | ORAL | Status: DC
Start: 2016-03-08 — End: 2016-03-09
  Administered 2016-03-08: 300 mg via ORAL
  Filled 2016-03-08: qty 4

## 2016-03-08 MED ORDER — SODIUM CHLORIDE 0.9 % IR SOLN
Status: AC
  Filled 2016-03-08: qty 500000

## 2016-03-08 MED ORDER — ATORVASTATIN CALCIUM 40 MG PO TABS
40.0000 mg | ORAL_TABLET | Freq: Every day | ORAL | Status: DC
  Administered 2016-03-08 – 2016-03-09 (×2): 40 mg via ORAL
  Filled 2016-03-08 (×2): qty 1

## 2016-03-08 MED ORDER — DEXAMETHASONE SODIUM PHOSPHATE 10 MG/ML IJ SOLN
INTRAMUSCULAR | Status: AC
  Filled 2016-03-08: qty 1

## 2016-03-08 MED ORDER — EPHEDRINE 5 MG/ML INJ
INTRAVENOUS | Status: AC
  Filled 2016-03-08: qty 20

## 2016-03-08 MED ORDER — LIDOCAINE 2% (20 MG/ML) 5 ML SYRINGE
INTRAMUSCULAR | Status: AC
  Filled 2016-03-08: qty 5

## 2016-03-08 MED ORDER — AMLODIPINE BESYLATE 5 MG PO TABS
5.0000 mg | ORAL_TABLET | Freq: Every day | ORAL | Status: DC
  Administered 2016-03-09: 5 mg via ORAL
  Filled 2016-03-08: qty 1

## 2016-03-08 MED ORDER — MORPHINE SULFATE (PF) 2 MG/ML IV SOLN: 2.0000 mg | INTRAVENOUS | Status: DC | PRN

## 2016-03-08 MED ORDER — ONDANSETRON HCL 4 MG/2ML IJ SOLN
INTRAMUSCULAR | Status: AC
  Filled 2016-03-08: qty 2

## 2016-03-08 MED ORDER — ONDANSETRON HCL 4 MG/2ML IJ SOLN: 4.0000 mg | INTRAMUSCULAR | Status: DC | PRN

## 2016-03-08 MED ORDER — MIDAZOLAM HCL 2 MG/2ML IJ SOLN
INTRAMUSCULAR | Status: AC
  Filled 2016-03-08: qty 2

## 2016-03-08 MED ORDER — DIPHENHYDRAMINE HCL 12.5 MG/5ML PO ELIX: 12.5000 mg | ORAL_SOLUTION | Freq: Four times a day (QID) | ORAL | Status: DC | PRN

## 2016-03-08 MED ORDER — CIPROFLOXACIN IN D5W 400 MG/200ML IV SOLN
INTRAVENOUS | Status: AC
  Filled 2016-03-08: qty 200

## 2016-03-08 MED ORDER — LIDOCAINE 2% (20 MG/ML) 5 ML SYRINGE
INTRAMUSCULAR | Status: DC | PRN
  Administered 2016-03-08: 60 mg via INTRAVENOUS

## 2016-03-08 MED ORDER — ENSURE ENLIVE PO LIQD: 237.0000 mL | Freq: Two times a day (BID) | ORAL | Status: DC

## 2016-03-08 MED ORDER — TIZANIDINE HCL 4 MG PO TABS
4.0000 mg | ORAL_TABLET | Freq: Three times a day (TID) | ORAL | Status: DC | PRN
  Administered 2016-03-08: 4 mg via ORAL
  Filled 2016-03-08: qty 1

## 2016-03-08 MED ORDER — HYDROCODONE-ACETAMINOPHEN 5-325 MG PO TABS
1.0000 | ORAL_TABLET | ORAL | Status: DC | PRN
  Administered 2016-03-08 – 2016-03-09 (×2): 1 via ORAL
  Filled 2016-03-08 (×2): qty 1

## 2016-03-08 MED ORDER — FENTANYL CITRATE (PF) 100 MCG/2ML IJ SOLN
INTRAMUSCULAR | Status: AC
  Filled 2016-03-08: qty 2

## 2016-03-08 MED ORDER — PROPOFOL 10 MG/ML IV BOLUS
INTRAVENOUS | Status: AC
  Filled 2016-03-08: qty 20

## 2016-03-08 MED ORDER — CEFAZOLIN SODIUM-DEXTROSE 2-4 GM/100ML-% IV SOLN
INTRAVENOUS | Status: AC
  Filled 2016-03-08: qty 100

## 2016-03-08 MED ORDER — ACETAMINOPHEN 325 MG PO TABS: 650.0000 mg | ORAL_TABLET | ORAL | Status: DC | PRN

## 2016-03-08 MED ORDER — LACTATED RINGERS IV SOLN
INTRAVENOUS | Status: DC
  Administered 2016-03-08: 16:00:00 via INTRAVENOUS

## 2016-03-08 MED ORDER — ALLOPURINOL 300 MG PO TABS
300.0000 mg | ORAL_TABLET | Freq: Every day | ORAL | Status: DC
  Administered 2016-03-09: 300 mg via ORAL
  Filled 2016-03-08: qty 1

## 2016-03-08 MED ORDER — ONDANSETRON HCL 4 MG/2ML IJ SOLN
INTRAMUSCULAR | Status: DC | PRN
  Administered 2016-03-08: 4 mg via INTRAVENOUS

## 2016-03-08 MED ORDER — LACTATED RINGERS IV SOLN
INTRAVENOUS | Status: DC | PRN
  Administered 2016-03-08 (×2): via INTRAVENOUS

## 2016-03-08 MED ORDER — CEFAZOLIN SODIUM-DEXTROSE 2-4 GM/100ML-% IV SOLN
2.0000 g | INTRAVENOUS | Status: AC
  Administered 2016-03-08: 2 g via INTRAVENOUS
  Filled 2016-03-08: qty 100

## 2016-03-08 MED ORDER — SODIUM CHLORIDE 0.9 % IR SOLN
Status: DC | PRN
  Administered 2016-03-08: 500 mL

## 2016-03-08 MED ORDER — MIDAZOLAM HCL 2 MG/2ML IJ SOLN
INTRAMUSCULAR | Status: DC | PRN
  Administered 2016-03-08: 1 mg via INTRAVENOUS

## 2016-03-08 MED ORDER — DEXAMETHASONE SODIUM PHOSPHATE 10 MG/ML IJ SOLN
INTRAMUSCULAR | Status: DC | PRN
  Administered 2016-03-08: 10 mg via INTRAVENOUS

## 2016-03-08 MED ORDER — EPHEDRINE SULFATE-NACL 50-0.9 MG/10ML-% IV SOSY
PREFILLED_SYRINGE | INTRAVENOUS | Status: DC | PRN
  Administered 2016-03-08: 5 mg via INTRAVENOUS
  Administered 2016-03-08 (×2): 10 mg via INTRAVENOUS
  Administered 2016-03-08 (×3): 5 mg via INTRAVENOUS
  Administered 2016-03-08: 10 mg via INTRAVENOUS

## 2016-03-08 MED ORDER — CIPROFLOXACIN IN D5W 400 MG/200ML IV SOLN
400.0000 mg | INTRAVENOUS | Status: AC
  Administered 2016-03-08: 400 mg via INTRAVENOUS

## 2016-03-08 SURGICAL SUPPLY — 42 items
ADH SKN CLS APL DERMABOND .7 (GAUZE/BANDAGES/DRESSINGS) ×1
BAG URINE DRAINAGE (UROLOGICAL SUPPLIES) ×3 IMPLANT
BLADE HEX COATED 2.75 (ELECTRODE) ×3 IMPLANT
BLADE SURG 15 STRL LF DISP TIS (BLADE) ×1 IMPLANT
BLADE SURG 15 STRL SS (BLADE) ×3
BLADE SURG SZ10 CARB STEEL (BLADE) ×3 IMPLANT
BNDG GAUZE ELAST 4 BULKY (GAUZE/BANDAGES/DRESSINGS) ×3 IMPLANT
BRIEF STRETCH FOR OB PAD LRG (UNDERPADS AND DIAPERS) ×3 IMPLANT
CATH FOLEY 2WAY SLVR  5CC 14FR (CATHETERS) ×2
CATH FOLEY 2WAY SLVR  5CC 16FR (CATHETERS)
CATH FOLEY 2WAY SLVR 5CC 14FR (CATHETERS) ×1 IMPLANT
CATH FOLEY 2WAY SLVR 5CC 16FR (CATHETERS) ×1 IMPLANT
COVER MAYO STAND STRL (DRAPES) IMPLANT
DECANTER SPIKE VIAL GLASS SM (MISCELLANEOUS) ×3 IMPLANT
DERMABOND ADVANCED (GAUZE/BANDAGES/DRESSINGS) ×2
DERMABOND ADVANCED .7 DNX12 (GAUZE/BANDAGES/DRESSINGS) ×2 IMPLANT
DRAPE SHEET LG 3/4 BI-LAMINATE (DRAPES) ×3 IMPLANT
DRSG TELFA 3X8 NADH (GAUZE/BANDAGES/DRESSINGS) ×3 IMPLANT
ELECT PENCIL ROCKER SW 15FT (MISCELLANEOUS) ×3 IMPLANT
GAUZE SPONGE 4X4 16PLY XRAY LF (GAUZE/BANDAGES/DRESSINGS) ×6 IMPLANT
GLOVE BIOGEL M STRL SZ7.5 (GLOVE) ×6 IMPLANT
GOWN STRL REUS W/TWL XL LVL3 (GOWN DISPOSABLE) ×3 IMPLANT
HOLDER FOLEY CATH W/STRAP (MISCELLANEOUS) ×3 IMPLANT
KIT BASIN OR (CUSTOM PROCEDURE TRAY) ×3 IMPLANT
PACK CYSTO (CUSTOM PROCEDURE TRAY) ×3 IMPLANT
PAD DRESSING TELFA 3X8 NADH (GAUZE/BANDAGES/DRESSINGS) ×1 IMPLANT
PLUG CATH AND CAP STER (CATHETERS) ×3 IMPLANT
POSITIONER SURGICAL ARM (MISCELLANEOUS) ×6 IMPLANT
SHEET LAVH (DRAPES) ×3 IMPLANT
SLING ADVANCE MALE SYSTEM ×2 IMPLANT
SUT SILK 2 0 30  PSL (SUTURE) ×2
SUT SILK 2 0 30 PSL (SUTURE) IMPLANT
SUT VIC AB 2-0 CT1 27 (SUTURE) ×6
SUT VIC AB 2-0 CT1 27XBRD (SUTURE) IMPLANT
SUT VIC AB 2-0 SH 27 (SUTURE) ×6
SUT VIC AB 2-0 SH 27X BRD (SUTURE) IMPLANT
SUT VIC AB 3-0 SH 27 (SUTURE) ×18
SUT VIC AB 3-0 SH 27XBRD (SUTURE) ×6 IMPLANT
SUT VIC AB 4-0 PS2 27 (SUTURE) ×4 IMPLANT
SYR 10ML LL (SYRINGE) ×3 IMPLANT
TOWEL OR 17X26 10 PK STRL BLUE (TOWEL DISPOSABLE) ×6 IMPLANT
YANKAUER SUCT BULB TIP NO VENT (SUCTIONS) ×6 IMPLANT

## 2016-03-08 NOTE — Op Note (Signed)
Preoperative diagnosis: Stress urinary incontinence Postoperative diagnosis: Stress urinary incontinence Surgery: Insertion of male sling plus cystoscopy Surgeon: Dr. Nicki Reaper Steffan Caniglia  The patient has the above diagnoses and consented to the above procedure. Extra care was taken with leg positioning to minimize the risk of compartment syndrome neuropathy and deep vein thrombosis. All bony landmarks and soft tissue landmarks were identified. Preoperative antibiotics were given.  I made an approximately 5 cm perineal incision dissecting through the soft tissue and exposing the bulbospongiosus muscle. It was easily mobilized. It looked a little bit flimsy. I split the muscle in the midline and mobilized the muscle off the  Bulbar urethra. I was careful with this mobilization. I identified palpably and visibly the peroneal tendon and placed a 3-0 Vicryl suture on it. I hesitated a few times throughout the case because the urethra was so soft and spongy almost like it was atrophied for instance post radiation. In spite of the findings I still felt it was safe to proceed with the surgery. It seemed to have lost its turgor  I then cystoscoped the patient identifying the bladder neck and proximal urethra. I placed my finger on the peroneal tendon and mimicked the surgery. There was appropriate winking at the bladder neck.  Foley catheter was replaced  I was diligent marking the adductor tendons. Bony landmarks identified the upper edge of the obturator foramen. I used the foramen needle to identify bone and then soft tissue. I made a scalpel incision 1 cm in size in the left groin area. With my finger in the upper aspect of the triangle in the perineum protecting the urethra I delivered the trocar with a double pop maneuver in the correct axis. The sling was attached in the correct axis. It was brought up atraumatically and anatomically. I did the exact same maneuver on the patient's right side. I then gently  brought the sling down close to urethra and delivered my 3-0 Vicryl through its center aspect.  I then placed the sling down to the peroneal tendon and gently pulled on both straps and the bulbar urethra rotationally ascended. The sling looked in excellent position.  I cystoscoped the patient. There was relative straightening of the urethra. There was winking of the bladder neck. There is no foreign body in the bladder or proximal urethra or bladder neck. Foley catheter was easily inserted  I did my usual anatomic 4 layer closure with 3 layers of 3-0 Vicryl and 4-0 Vicryl for the skin.  I made 2 separate stab incisions one in each groin and delivered the sling through it. I cut the sheath and irrigated the sheaths and remove the sheaths. I closed the 2 inguinal incisions with interrupted 4-0 Vicryl. All incisions were covered with Dermabond. Leg position was excellent. Urine was clear. The procedure went very well and hopefully it will reach the patient's treatment goal

## 2016-03-08 NOTE — Anesthesia Procedure Notes (Signed)
Procedure Name: LMA Insertion Date/Time: 03/08/2016 7:46 AM Performed by: Cynda Familia Pre-anesthesia Checklist: Patient identified, Emergency Drugs available, Suction available and Patient being monitored Patient Re-evaluated:Patient Re-evaluated prior to inductionOxygen Delivery Method: Circle System Utilized Preoxygenation: Pre-oxygenation with 100% oxygen Intubation Type: IV induction Ventilation: Mask ventilation without difficulty LMA: LMA inserted LMA Size: 4.0 Tube type: Oral Tube size: 4.0 mm Number of attempts: 1 Placement Confirmation: positive ETCO2 Tube secured with: Tape Dental Injury: Teeth and Oropharynx as per pre-operative assessment  Comments: Smooth IV induction---  Ola Spurr--- AM CRNA--- atraumatic--- repositioned by Ola Spurr--- bilat BS

## 2016-03-08 NOTE — Interval H&P Note (Signed)
History and Physical Interval Note:  03/08/2016 7:24 AM  Nathan Wallace  has presented today for surgery, with the diagnosis of STRESS INCONTINENCE  The various methods of treatment have been discussed with the patient and family. After consideration of risks, benefits and other options for treatment, the patient has consented to  Procedure(s): MALE SLING CYSTOSCOPY (N/A) as a surgical intervention .  The patient's history has been reviewed, patient examined, no change in status, stable for surgery.  I have reviewed the patient's chart and labs.  Questions were answered to the patient's satisfaction.     [name]

## 2016-03-08 NOTE — Anesthesia Postprocedure Evaluation (Signed)
Anesthesia Post Note  Patient: Nathan Wallace  Procedure(s) Performed: Procedure(s) (LRB): MALE SLING CYSTOSCOPY (N/A)  Patient location during evaluation: PACU Anesthesia Type: General Level of consciousness: awake and alert Pain management: pain level controlled Vital Signs Assessment: post-procedure vital signs reviewed and stable Respiratory status: spontaneous breathing, nonlabored ventilation and respiratory function stable Cardiovascular status: blood pressure returned to baseline and stable Postop Assessment: no signs of nausea or vomiting Anesthetic complications: no    Last Vitals:  Vitals:   03/08/16 1030 03/08/16 1045  BP: 114/73 111/74  Pulse: 65 70  Resp: 15 (!) 22  Temp:      Last Pain:  Vitals:   03/08/16 1030  TempSrc:   PainSc: Asleep                 Charnele Semple,W. EDMOND

## 2016-03-08 NOTE — Transfer of Care (Signed)
Immediate Anesthesia Transfer of Care Note  Patient: Nathan Wallace  Procedure(s) Performed: Procedure(s): MALE SLING CYSTOSCOPY (N/A)  Patient Location: PACU  Anesthesia Type:General  Level of Consciousness: awake and alert   Airway & Oxygen Therapy: Patient Spontanous Breathing and Patient connected to face mask oxygen  Post-op Assessment: Report given to RN and Post -op Vital signs reviewed and stable  Post vital signs: Reviewed and stable  Last Vitals:  Vitals:   03/08/16 0542  BP: 122/80  Pulse: 71  Resp: 16  Temp: 36.5 C    Last Pain:  Vitals:   03/08/16 0542  TempSrc: Oral      Patients Stated Pain Goal: 4 ([id number])  Complications: No apparent anesthesia complications

## 2016-03-08 NOTE — Progress Notes (Signed)
Looks good  Sore scrotal area and no leg pain All incisions good Vitals normal See in am Weak tissue findings told to patient

## 2016-03-09 DIAGNOSIS — N3281 Overactive bladder: Secondary | ICD-10-CM | POA: Diagnosis not present

## 2016-03-09 DIAGNOSIS — M1990 Unspecified osteoarthritis, unspecified site: Secondary | ICD-10-CM | POA: Diagnosis not present

## 2016-03-09 DIAGNOSIS — R300 Dysuria: Secondary | ICD-10-CM | POA: Diagnosis not present

## 2016-03-09 DIAGNOSIS — D630 Anemia in neoplastic disease: Secondary | ICD-10-CM | POA: Diagnosis not present

## 2016-03-09 DIAGNOSIS — I10 Essential (primary) hypertension: Secondary | ICD-10-CM | POA: Diagnosis not present

## 2016-03-09 DIAGNOSIS — Z7982 Long term (current) use of aspirin: Secondary | ICD-10-CM | POA: Diagnosis not present

## 2016-03-09 DIAGNOSIS — N393 Stress incontinence (female) (male): Secondary | ICD-10-CM | POA: Diagnosis not present

## 2016-03-09 DIAGNOSIS — M62838 Other muscle spasm: Secondary | ICD-10-CM | POA: Diagnosis not present

## 2016-03-09 DIAGNOSIS — Z8673 Personal history of transient ischemic attack (TIA), and cerebral infarction without residual deficits: Secondary | ICD-10-CM | POA: Diagnosis not present

## 2016-03-09 DIAGNOSIS — C61 Malignant neoplasm of prostate: Secondary | ICD-10-CM | POA: Diagnosis not present

## 2016-03-09 DIAGNOSIS — Z87891 Personal history of nicotine dependence: Secondary | ICD-10-CM | POA: Diagnosis not present

## 2016-03-09 LAB — HEMOGLOBIN AND HEMATOCRIT, BLOOD
HCT: 34.4 % — ABNORMAL LOW (ref 39.0–52.0)
HEMOGLOBIN: 11.6 g/dL — AB (ref 13.0–17.0)

## 2016-03-09 MED ORDER — HYDROCODONE-ACETAMINOPHEN 5-325 MG PO TABS: 1.0000 | ORAL_TABLET | ORAL | 0 refills | Status: DC | PRN

## 2016-03-09 MED ORDER — ASPIRIN 81 MG PO CHEW
81.0000 mg | CHEWABLE_TABLET | Freq: Every day | ORAL | Status: DC
Start: 2016-03-14 — End: 2020-05-21

## 2016-03-09 NOTE — Progress Notes (Signed)
Vitals and labs normal Not active yet Incisions looks good Right mid thigh bit sore but can move legs normal and no radiated pain No significant focal tenderness in groin near adductors even with leg movement Trial of void today and mobilized Instructions detailed Observe rt leg discomfort likely from positioning

## 2016-03-09 NOTE — Discharge Summary (Signed)
Date of admission: 03/08/2016  Date of discharge: 03/09/2016  Admission diagnosis: Stress incontinence  Discharge diagnosis: Stress incontinence  Secondary diagnoses: Prostate cancer   History and Physical: For full details, please see admission history and physical. Briefly, Nathan Wallace is a 65 y.o. year old patient with the above diagnosis.   Hospital Course: Male sling uneventful. Post op course excellent  Laboratory values:  Recent Labs  03/09/16 0553  HGB 11.6*  HCT 34.4*   No results for input(s): CREATININE in the last 72 hours.  Disposition: Home  Discharge instruction: The patient was instructed to be ambulatory but told to refrain from heavy lifting, strenuous activity, or driving. Detailed.   Discharge medications:    Medication List    TAKE these medications   allopurinol 300 MG tablet Commonly known as:  ZYLOPRIM Take 1 tablet (300 mg total) by mouth daily.   amLODipine 5 MG tablet Commonly known as:  NORVASC TAKE 1 TABLET BY MOUTH  DAILY   aspirin 81 MG chewable tablet Chew 1 tablet (81 mg total) by mouth daily. Start taking on:  03/14/2016   atorvastatin 40 MG tablet Commonly known as:  LIPITOR Take 40 mg by mouth daily.   B-12 COMPLIANCE INJECTION IJ Inject 1 Dose as directed every 30 (thirty) days.   colchicine 0.6 MG tablet Take 1 tablet (0.6 mg total) by mouth 2 (two) times daily. For 5 days when having flare.   gabapentin 300 MG capsule Commonly known as:  NEURONTIN Take 2 capsules (600 mg total) by mouth 3 (three) times daily. What changed:  when to take this   HYDROcodone-acetaminophen 5-325 MG tablet Commonly known as:  NORCO/VICODIN Take 1-2 tablets by mouth every 4 (four) hours as needed for moderate pain.   irbesartan 300 MG tablet Commonly known as:  AVAPRO TAKE 1 TABLET BY MOUTH AT  BEDTIME   PRESCRIPTION MEDICATION 1 Syringe by Intracavernosal route every other day as needed (10-30 minutes before you have sex for ED).  Matrix (Papaverine 30mg  Phentolamine 1mg  Alprostadil 42mcg per ml injection)  erectile dysfunction injection every other day as needed for sex   tiZANidine 4 MG tablet Commonly known as:  ZANAFLEX Take 1 tablet by mouth 3  times daily as needed for  muscle spasm(s)   traMADol 50 MG tablet Commonly known as:  ULTRAM Take 1 tablet (50 mg total) by mouth 2 (two) times daily as needed. What changed:  when to take this   vitamin C 500 MG tablet Commonly known as:  ASCORBIC ACID Take 1,500 mg by mouth daily.   Vitamin D-3 1000 units Caps Take 1,000 Units by mouth daily.       Followup:  Follow-up Information    Mikias Lanz A, MD Follow up.   Specialty:  Urology Contact information: [place] [address] [phone]

## 2016-03-09 NOTE — Discharge Instructions (Signed)
Restart your aspirin after 5 days unless otherwise directed by Dr. Matilde Sprang.   [facility] for fever >101.90F by mouth, uncontrolled nausea or vomiting, pain uncontrolled by medication, decreased urine output, signs of wound infection (rapidly spreading redness/swelling, purulent discharge, increasing bleeding from wounds, or separation of wound); also call for any new or concerning symptoms.   You may shower, but do not immerse (take a bath or swim) your wounds for at least 2 weeks.  Wash the surgical site with mild soap and water, but do not scrub vigorously. Pat dry.   Do not drive while taking narcotic pain medications. Take Colace and/or Senna while taking narcotic pain medication. You may take over-the-counter Laxatives such as Miralax, Senna, or Milk of Magnesia. Stop taking these medications if you develop diarrhea.  Take all medications as prescribed.  No strenuous activity or heavy lifting (greater than 10 lbs or about the weight of 1 gallon of milk) for 4-6 weeks  Call the clinic to confirm your follow-up appointment if not already made.

## 2016-03-09 NOTE — Progress Notes (Signed)
Nutrition Brief Note  Patient identified on the Malnutrition Screening Tool (MST) Report  Wt Readings from Last 15 Encounters:  03/08/16 153 lb (69.4 kg)  03/02/16 153 lb 8 oz (69.6 kg)  01/14/16 156 lb 6 oz (70.9 kg)  01/07/16 154 lb 1.6 oz (69.9 kg)  01/05/16 155 lb (70.3 kg)  11/26/15 158 lb (71.7 kg)  11/03/15 157 lb (71.2 kg)  09/23/15 159 lb (72.1 kg)  08/31/15 161 lb (73 kg)  07/20/15 157 lb (71.2 kg)  01/05/15 157 lb 3.2 oz (71.3 kg)  09/15/14 160 lb (72.6 kg)  06/02/14 158 lb (71.7 kg)  05/19/14 166 lb 9.6 oz (75.6 kg)  05/14/14 158 lb 4 oz (71.8 kg)    Body mass index is 21.34 kg/m. Patient meets criteria for normal weight based on current BMI.   Current diet order is Regular. Pt consumed 100% of breakfast this AM (omelet, grits, banana, 2 sweet teas, and Sprite).  Pt admitted for urinary incontinence following radical prostatectomy. D/c summary in place at this time. Labs and medications reviewed.   No nutrition interventions warranted at this time. If nutrition issues arise, please consult RD.    Jarome Matin, MS, RD, LDN, [facility] Inpatient Clinical Dietitian Pager # [phone] After hours/weekend pager # [phone]

## 2016-03-09 NOTE — Care Management Note (Signed)
Case Management Note  Patient Details  Name: Nathan Wallace MRN: [id number] Date of Birth: 19-Aug-1950  Subjective/Objective:65 y/o m admitted w/male stress incontinence. From home.                    Action/Plan:d/c home.   Expected Discharge Date:                  Expected Discharge Plan:  Home/Self Care  In-House Referral:     Discharge planning Services  CM Consult  Post Acute Care Choice:    Choice offered to:     DME Arranged:    DME Agency:     HH Arranged:    [place] Agency:     Status of Service:  Completed, signed off  If discussed at [employer] of Stay Meetings, dates discussed:    Additional Comments:  Dessa Phi, RN 03/09/2016, 8:59 AM

## 2016-03-12 ENCOUNTER — Telehealth: Payer: Self-pay

## 2016-03-12 NOTE — Telephone Encounter (Signed)
Pt admitted for male incontinence and prostate cancer. Pt to follow up with Urology.

## 2016-03-16 DIAGNOSIS — R350 Frequency of micturition: Secondary | ICD-10-CM | POA: Diagnosis not present

## 2016-03-22 ENCOUNTER — Ambulatory Visit (INDEPENDENT_AMBULATORY_CARE_PROVIDER_SITE_OTHER): Payer: Medicare Other | Admitting: Internal Medicine

## 2016-03-22 ENCOUNTER — Encounter: Payer: Self-pay | Admitting: Internal Medicine

## 2016-03-22 ENCOUNTER — Ambulatory Visit (INDEPENDENT_AMBULATORY_CARE_PROVIDER_SITE_OTHER)
Admission: RE | Admit: 2016-03-22 | Discharge: 2016-03-22 | Disposition: A | Discharge status: Home / Self Care | Payer: Medicare Other | Source: Ambulatory Visit | Attending: Internal Medicine | Admitting: Internal Medicine

## 2016-03-22 VITALS — BP 128/78 | HR 62 | Temp 98.2°F | Resp 20 | Wt 152.0 lb

## 2016-03-22 DIAGNOSIS — M25511 Pain in right shoulder: Secondary | ICD-10-CM

## 2016-03-22 DIAGNOSIS — E538 Deficiency of other specified B group vitamins: Secondary | ICD-10-CM

## 2016-03-22 DIAGNOSIS — E785 Hyperlipidemia, unspecified: Secondary | ICD-10-CM | POA: Diagnosis not present

## 2016-03-22 DIAGNOSIS — I10 Essential (primary) hypertension: Secondary | ICD-10-CM | POA: Diagnosis not present

## 2016-03-22 DIAGNOSIS — M25512 Pain in left shoulder: Secondary | ICD-10-CM | POA: Diagnosis not present

## 2016-03-22 DIAGNOSIS — R7302 Impaired glucose tolerance (oral): Secondary | ICD-10-CM

## 2016-03-22 DIAGNOSIS — Z0001 Encounter for general adult medical examination with abnormal findings: Secondary | ICD-10-CM

## 2016-03-22 MED ORDER — CYANOCOBALAMIN 1000 MCG/ML IJ SOLN
1000.0000 ug | Freq: Once | INTRAMUSCULAR | Status: AC
  Administered 2016-03-22: 1000 ug via INTRAMUSCULAR

## 2016-03-22 MED ORDER — DICLOFENAC SODIUM 1 % TD GEL: 4.0000 g | Freq: Four times a day (QID) | TRANSDERMAL | 5 refills | Status: DC | PRN

## 2016-03-22 NOTE — Progress Notes (Signed)
Subjective:    Patient ID: Nathan Wallace, male    DOB: 1951/01/19, 65 y.o.   MRN: [id number]  HPI  Here to f/u; overall doing ok,  Pt denies chest pain, increasing sob or doe, wheezing, orthopnea, PND, increased LE swelling, palpitations, dizziness or syncope.  Pt denies new neurological symptoms such as new headache, or facial or extremity weakness or numbness.  Pt denies polydipsia, polyuria, or low sugar episode.   Pt denies new neurological symptoms such as new headache, or facial or extremity weakness or numbness.   Pt states overall good compliance with meds, mostly trying to follow appropriate diet, with wt overall stable, has had close attention per heme in last 6 mo with several labs.   No other new history  Has ongoing bilat shoulder pain, followed per Dr Smith/sport med, c/o unable to abduct arms due to shoulder pain with lifting even 5 lbs.  Last seen aug 2017 for cervical disc dz only, hjas not been seen for shoulder pain. Has not been using topical prep per dr Tamala Julian as too expensive   Past Medical History:  Diagnosis Date  . AKI (acute kidney injury) ([place])   . Arthritis    RT KNEE AND RT SHOULDER  . B12 deficiency   . Benign head tremor   . Chest tightness    comes and goes   . CVA (cerebrovascular accident due to intracerebral hemorrhage) ([place]) 08/03/2011  . Degenerative joint disease of knee, right   . Depression 07/26/2012  . GERD (gastroesophageal reflux disease)    NO RECENT PROBLEMS AND NO MEDS  . History of blood transfusion   . History of urinary tract infection   . Hyperlipidemia    border line  . Hypertension   . Impaired glucose tolerance 06/22/2011  . Left inguinal hernia   . MGUS (monoclonal gammopathy of unknown significance)   . Prostate cancer ([place]) 2011  . Sepsis ([place])   . Stress incontinence   . Stroke [facility])    2013  . Syncope 2012   after prostate surgery; neg neurology work up.    Past Surgical History:  Procedure Laterality Date  . COLONOSCOPY     . INGUINAL HERNIA REPAIR  06/01/2011   Procedure: HERNIA REPAIR INGUINAL ADULT;  Surgeon: Adin Hector, MD;  Location: [facility] ORS;  Service: General;  Laterality: Left;  Left Inguinal Hernia Repair with Mesh  . ROBOT ASSISTED LAPAROSCOPIC RADICAL PROSTATECTOMY  jan 2012   da vinci  . SPINE SURGERY  2003 OR 2004   CERVICAL FUSION-STATES LIMITED ROM NECK  . URETHRAL SLING N/A 03/08/2016   Procedure: MALE SLING CYSTOSCOPY;  Surgeon: Bjorn Loser, MD;  Location: [facility] ORS;  Service: Urology;  Laterality: N/A;  . WRIST SURGERY     right     reports that he quit smoking about 49 years ago. His smoking use included Cigarettes. He has a 0.50 pack-year smoking history. He has never used smokeless tobacco. He reports that he drinks alcohol. He reports that he does not use drugs. family history includes Aneurysm in his mother; Cancer in his father; Colon cancer in his father; Heart attack in his brother; Hypertension in his mother. No Known Allergies Current Outpatient Prescriptions on File Prior to Visit  Medication Sig Dispense Refill  . allopurinol (ZYLOPRIM) 300 MG tablet Take 1 tablet (300 mg total) by mouth daily. 90 tablet 1  . amLODipine (NORVASC) 5 MG tablet TAKE 1 TABLET BY MOUTH  DAILY 90 tablet 2  .  aspirin 81 MG chewable tablet Chew 1 tablet (81 mg total) by mouth daily.    Marland Kitchen atorvastatin (LIPITOR) 40 MG tablet Take 40 mg by mouth daily.    . Cholecalciferol (VITAMIN D-3) 1000 units CAPS Take 1,000 Units by mouth daily.    . colchicine 0.6 MG tablet Take 1 tablet (0.6 mg total) by mouth 2 (two) times daily. For 5 days when having flare. 60 tablet 2  . Cyanocobalamin (B-12 COMPLIANCE INJECTION IJ) Inject 1 Dose as directed every 30 (thirty) days.    Marland Kitchen gabapentin (NEURONTIN) 300 MG capsule Take 2 capsules (600 mg total) by mouth 3 (three) times daily. (Patient taking differently: Take 600 mg by mouth 2 (two) times daily. ) 270 capsule 3  . HYDROcodone-acetaminophen (NORCO/VICODIN) 5-325 MG  tablet Take 1-2 tablets by mouth every 4 (four) hours as needed for moderate pain. 30 tablet 0  . irbesartan (AVAPRO) 300 MG tablet TAKE 1 TABLET BY MOUTH AT  BEDTIME 90 tablet 0  . PRESCRIPTION MEDICATION 1 Syringe by Intracavernosal route every other day as needed (10-30 minutes before you have sex for ED). Matrix (Papaverine 30mg  Phentolamine 1mg  Alprostadil 6mcg per ml injection)  erectile dysfunction injection every other day as needed for sex    . tiZANidine (ZANAFLEX) 4 MG tablet Take 1 tablet by mouth 3  times daily as needed for  muscle spasm(s) 270 tablet 1  . traMADol (ULTRAM) 50 MG tablet Take 1 tablet (50 mg total) by mouth 2 (two) times daily as needed. (Patient taking differently: Take 50 mg by mouth daily. ) 180 tablet 1  . vitamin C (ASCORBIC ACID) 500 MG tablet Take 1,500 mg by mouth daily.     No current facility-administered medications on file prior to visit.    Review of Systems  Constitutional: Negative for unusual diaphoresis or night sweats HENT: Negative for ear swelling or discharge Eyes: Negative for worsening visual haziness  Respiratory: Negative for choking and stridor.   Gastrointestinal: Negative for distension or worsening eructation Genitourinary: Negative for retention or change in urine volume.  Musculoskeletal: Negative for other MSK pain or swelling Skin: Negative for color change and worsening wound Neurological: Negative for tremors and numbness other than noted  Psychiatric/Behavioral: Negative for decreased concentration or agitation other than above   All other system neg per pt    Objective:   Physical Exam BP 128/78   Pulse 62   Temp 98.2 F (36.8 C) (Oral)   Resp 20   Wt 152 lb (68.9 kg)   SpO2 98%   BMI 21.20 kg/m  VS noted,  Constitutional: Pt appears in no apparent distress HENT: Head: NCAT.  Right Ear: External ear normal.  Left Ear: External ear normal.  Eyes: . Pupils are equal, round, and reactive to light. Conjunctivae and  EOM are normal Neck: Normal range of motion. Neck supple.  Cardiovascular: Normal rate and regular rhythm.   Pulmonary/Chest: Effort normal and breath sounds without rales or wheezing.  Bilat shoudlers with mild tender, no swelling, + crepitus and reduced ROM on abduction Neurological: Pt is alert. Not confused , motor grossly intact Skin: Skin is warm. No rash, no LE edema Psychiatric: Pt behavior is normal. No agitation.  No other new exam findings   Lab Results  Component Value Date   WBC 7.0 03/02/2016   HGB 11.6 (L) 03/09/2016   HCT 34.4 (L) 03/09/2016   PLT 269 03/02/2016   GLUCOSE 117 (H) 03/02/2016   CHOL 135 09/23/2015  TRIG 96.0 09/23/2015   HDL 52.90 09/23/2015   LDLDIRECT 87.9 12/19/2011   LDLCALC 63 09/23/2015   ALT 31 03/02/2016   AST 29 03/02/2016   NA 140 03/02/2016   K 4.7 03/02/2016   CL 106 03/02/2016   CREATININE 1.17 03/02/2016   BUN 13 03/02/2016   CO2 29 03/02/2016   TSH 3.382 01/07/2016   PSA 0.07 (L) 09/23/2015   INR 1.12 03/02/2016   HGBA1C 6.0 09/23/2015       Assessment & Plan:

## 2016-03-22 NOTE — Progress Notes (Signed)
Pre visit review using our clinic review tool, if applicable. No additional management support is needed unless otherwise documented below in the visit note. 

## 2016-03-22 NOTE — Patient Instructions (Addendum)
You had the B12 shot today  Please take all new medication as prescribed - the topical gel pain medication  You will be contacted regarding the referral for: Dr Tamala Julian [facility] medicine  Please continue all other medications as before, and refills have been done if requested.  Please have the pharmacy call with any other refills you may need.  Please continue your efforts at being more active, low cholesterol diet, and weight control.  Please keep your appointments with your specialists as you may have planned  Please go to the XRAY Department in the Basement (go straight as you get off the elevator) for the x-ray testing  You will be contacted by phone if any changes need to be made immediately.  Otherwise, you will receive a letter about your results with an explanation, but please check with MyChart first.  Please remember to sign up for MyChart if you have not done so, as this will be important to you in the future with finding out test results, communicating by private email, and scheduling acute appointments online when needed.  Please return in 3 months, or sooner if needed, with Lab testing done 3-5 days before

## 2016-03-22 NOTE — Assessment & Plan Note (Signed)
C/w DJD and possible impingement syndromes bilat; for films today, volt gel prn, Refer to Dr Smith/sport med

## 2016-03-28 NOTE — Assessment & Plan Note (Signed)
For IM replacement today and monthly

## 2016-03-28 NOTE — Assessment & Plan Note (Signed)
stable overall by history and exam, recent data reviewed with pt, and pt to continue medical treatment as before,  to f/u any worsening symptoms or concerns Lab Results  Component Value Date   HGBA1C 6.0 09/23/2015

## 2016-03-28 NOTE — Assessment & Plan Note (Signed)
stable overall by history and exam, recent data reviewed with pt, and pt to continue medical treatment as before,  to f/u any worsening symptoms or concerns BP Readings from Last 3 Encounters:  03/22/16 128/78  03/09/16 101/63  03/02/16 108/71

## 2016-03-28 NOTE — Assessment & Plan Note (Signed)
stable overall by history and exam, recent data reviewed with pt, and pt to continue medical treatment as before,  to f/u any worsening symptoms or concerns Lab Results  Component Value Date   LDLCALC 63 09/23/2015

## 2016-03-29 ENCOUNTER — Ambulatory Visit: Payer: Medicare Other

## 2016-04-18 ENCOUNTER — Other Ambulatory Visit: Payer: Self-pay | Admitting: Internal Medicine

## 2016-04-20 ENCOUNTER — Ambulatory Visit: Payer: Medicare Other | Admitting: Family Medicine

## 2016-04-21 ENCOUNTER — Ambulatory Visit (INDEPENDENT_AMBULATORY_CARE_PROVIDER_SITE_OTHER): Payer: Medicare Other | Admitting: General Practice

## 2016-04-21 DIAGNOSIS — E538 Deficiency of other specified B group vitamins: Secondary | ICD-10-CM | POA: Diagnosis not present

## 2016-04-21 MED ORDER — CYANOCOBALAMIN 1000 MCG/ML IJ SOLN
1000.0000 ug | Freq: Once | INTRAMUSCULAR | Status: AC
  Administered 2016-04-21: 1000 ug via INTRAMUSCULAR

## 2016-04-22 ENCOUNTER — Ambulatory Visit: Payer: Medicare Other

## 2016-04-28 ENCOUNTER — Ambulatory Visit: Payer: Medicare Other | Admitting: Family Medicine

## 2016-05-11 NOTE — Progress Notes (Deleted)
[facility] [address], [place] [address] Phone: [phone] Subjective:    I'm seeing this patient by the request  of:  Cathlean Cower, MD   CC: left shoulder pain and neck pain f/u  Bilateral wrist pain  [id number]  Nathan Wallace is a 66 y.o. male coming in with complaint of left shoulder pain and neck pain follow up. Of note patient does have a past medical history significant for C4 through C7 fusion. He was started on gabapentin at last visit and titrated up to 600 mg 3 times a day. Patient states that it helps with the radiation pain. Still has the chronic dull aching sensation.  Patient states he is also had some exacerbation with his wrists. Patient was given a brace for the left wrist and states that this has helped out significantly. Unfortunate having worsening pain on the right wrist. Patient states that as long as he would have a brace he thinks he was doing relatively well. Has had surgery on that side before for a ganglion cyst as well as carpal tunnel. Patient feels like there could be an association with the neck but does not remember exactly  Patient states that he is having more chronic aches and pains all over. Seems to be as knees, thighs, lower back as well.  Previous x-rays in April 2015 showed fusion of C4-C7 and moderate severe degenerative disc disease at C6, C7-T1. Moderate arthritic changes at C2-C3 as well.  Past Medical History:  Diagnosis Date  . AKI (acute kidney injury) ([place])   . Arthritis    RT KNEE AND RT SHOULDER  . B12 deficiency   . Benign head tremor   . Chest tightness    comes and goes   . CVA (cerebrovascular accident due to intracerebral hemorrhage) ([place]) 08/03/2011  . Degenerative joint disease of knee, right   . Depression 07/26/2012  . GERD (gastroesophageal reflux disease)    NO RECENT PROBLEMS AND NO MEDS  . History of blood transfusion   . History of urinary tract infection   . Hyperlipidemia      border line  . Hypertension   . Impaired glucose tolerance 06/22/2011  . Left inguinal hernia   . MGUS (monoclonal gammopathy of unknown significance)   . Prostate cancer ([place]) 2011  . Sepsis ([place])   . Stress incontinence   . Stroke [facility])    2013  . Syncope 2012   after prostate surgery; neg neurology work up.    Past Surgical History:  Procedure Laterality Date  . COLONOSCOPY    . INGUINAL HERNIA REPAIR  06/01/2011   Procedure: HERNIA REPAIR INGUINAL ADULT;  Surgeon: Adin Hector, MD;  Location: [facility];  Service: General;  Laterality: Left;  Left Inguinal Hernia Repair with Mesh  . ROBOT ASSISTED LAPAROSCOPIC RADICAL PROSTATECTOMY  jan 2012   da vinci  . SPINE SURGERY  2003 OR 2004   CERVICAL FUSION-STATES LIMITED ROM NECK  . URETHRAL SLING N/A 03/08/2016   Procedure: MALE SLING CYSTOSCOPY;  Surgeon: Bjorn Loser, MD;  Location: [facility];  Service: Urology;  Laterality: N/A;  . WRIST SURGERY     right    Social History  Substance Use Topics  . Smoking status: Former Smoker    Packs/day: 0.50    Years: 1.00    Types: Cigarettes    Quit date: 06/28/1966  . Smokeless tobacco: Never Used  . Alcohol use Yes     Comment: 3  beers daily; liquor 2 glasses every other day    No Known Allergies Family History  Problem Relation Age of Onset  . Hypertension Mother   . Aneurysm Mother     brain  . Cancer Father     colon  . Colon cancer Father   . Heart attack Brother         Past medical history, social, surgical and family history all reviewed in electronic medical record.   Review of Systems: No headache, visual changes, nausea, vomiting, diarrhea, constipation, dizziness, abdominal pain, skin rash, fevers, chills, night sweats, weight loss, swollen lymph nodes, body aches, joint swelling, muscle aches, chest pain, shortness of breath, mood changes.   Objective  There were no vitals taken for this visit.  General: No apparent distress alert and oriented x3 mood  and affect normal, dressed appropriately.  HEENT: Pupils equal, extraocular movements intact  Respiratory: Patient's speak in full sentences and does not appear short of breath  Cardiovascular: No lower extremity edema, non tender, no erythema  Skin: Warm dry intact with no signs of infection or rash on extremities or on axial skeleton.  Abdomen: Soft nontender  Neuro: Cranial nerves II through XII are intact, neurovascularly intact in all extremities with 2+ DTRs and 2+ pulses.  Lymph: No lymphadenopathy of posterior or anterior cervical chain or axillae bilaterally.  Gait normal with good balance and coordination.  MSK:  Non tender with full range of motion and good stability and symmetric strength and tone of  elbows,  hip, knee and ankles bilaterally. Osteoarthritic changes of multiple joints   Wrist:left Mild swelling on the dorsal aspect of the left wrist ROM smooth and normal with good flexion and extension and ulnar/radial deviation that is symmetrical with opposite wrist. Diffuse tenderness of the wrist No snuffbox tenderness. No tenderness over Canal of Guyon. Strength 5/5 in all directions without pain. Negative Finkelstein, tinel's and phalens. Negative Watson's test. Hunter lateral risk unremarkable  Neck: Inspection shows loss of lordosis No palpable stepoffs. Positive Spurling's maneuver noted. Bilaterally Limited range of motion in flexion-extension as well as right side bending secondary to fusion Grip strength and sensation normal in bilateral hands Strength good C4 to T1 distribution, with continued weakness of the T1 distribution compared to the contralateral side No sensory change to C4 to T1 Negative Hoffman sign bilaterally Reflexes normal  Shoulder:bilateral Atrophy of the shoulder girdle musculature bilaterally. Palpation is normal with no tenderness over AC joint or bicipital groove. ROM is full in all planes.mild crepitus with range of motion Rotator  cuff strength 4/5 but symmetric signs of impingement with psitiveNeer and Hawkin's tests, empty can sign. Speeds and Yergason's tests normal. positive painful arc bilaterally No apprehension sign   Wrist had generalized tenderness with mild crepitus bilaterally. Grip strength is 4 out of 5 bilaterally. Negative de Quervain's sign with Wynn Maudlin. Negative carpal tunnel signs with Tinel's.    Impression and Recommendations:     This case required medical decision making of moderate complexity.

## 2016-05-12 ENCOUNTER — Ambulatory Visit: Payer: Medicare Other | Admitting: Family Medicine

## 2016-05-23 ENCOUNTER — Ambulatory Visit (INDEPENDENT_AMBULATORY_CARE_PROVIDER_SITE_OTHER): Payer: Medicare Other

## 2016-05-23 ENCOUNTER — Ambulatory Visit: Payer: Medicare Other

## 2016-05-23 DIAGNOSIS — E538 Deficiency of other specified B group vitamins: Secondary | ICD-10-CM | POA: Diagnosis not present

## 2016-05-23 MED ORDER — CYANOCOBALAMIN 1000 MCG/ML IJ SOLN
1000.0000 ug | Freq: Once | INTRAMUSCULAR | Status: AC
  Administered 2016-05-23: 1000 ug via INTRAMUSCULAR

## 2016-05-24 ENCOUNTER — Other Ambulatory Visit: Payer: Self-pay

## 2016-05-24 MED ORDER — TRAMADOL HCL 50 MG PO TABS: 50.0000 mg | ORAL_TABLET | Freq: Two times a day (BID) | ORAL | 0 refills | Status: DC | PRN

## 2016-05-24 NOTE — Telephone Encounter (Signed)
Medication refilled. [place] Controlled Substance Database reviewed with no irregularities.

## 2016-05-24 NOTE — Telephone Encounter (Signed)
rx faxed to pharmacy

## 2016-05-24 NOTE — Telephone Encounter (Signed)
Please advise patient is requesting refill of tramadol to be sent to [facility]

## 2016-05-31 NOTE — Progress Notes (Deleted)
[name] [address], [place] [address] Phone: [phone] Subjective:    I'm seeing this patient by the request  of:  Cathlean Cower, MD   CC: left shoulder pain and neck pain f/u  Bilateral wrist pain  [id number]  Nathan Wallace is a 66 y.o. male coming in with complaint of left shoulder pain and neck pain follow up. Of note patient does have a past medical history significant for C4 through C7 fusion. He was started on gabapentin at last visit and titrated up to 600 mg 3 times a day. Patient states that it helps with the radiation pain. Still has the chronic dull aching sensation.  Patient states he is also had some exacerbation with his wrists. Patient was given a brace for the left wrist and states that this has helped out significantly. Unfortunate having worsening pain on the right wrist. Patient states that as long as he would have a brace he thinks he was doing relatively well. Has had surgery on that side before for a ganglion cyst as well as carpal tunnel. Patient feels like there could be an association with the neck but does not remember exactly  Patient states that he is having more chronic aches and pains all over. Seems to be as knees, thighs, lower back as well.  Previous x-rays in April 2015 showed fusion of C4-C7 and moderate severe degenerative disc disease at C6, C7-T1. Moderate arthritic changes at C2-C3 as well. X-rays of patient's left shoulder taken December 2017 the left side show that patient does have what seems to be calcific changes in the proximal humerus laterally. Possible loose body  Past Medical History:  Diagnosis Date  . AKI (acute kidney injury) ([place])   . Arthritis    RT KNEE AND RT SHOULDER  . B12 deficiency   . Benign head tremor   . Chest tightness    comes and goes   . CVA (cerebrovascular accident due to intracerebral hemorrhage) ([place]) 08/03/2011  . Degenerative joint disease of knee, right   .  Depression 07/26/2012  . GERD (gastroesophageal reflux disease)    NO RECENT PROBLEMS AND NO MEDS  . History of blood transfusion   . History of urinary tract infection   . Hyperlipidemia    border line  . Hypertension   . Impaired glucose tolerance 06/22/2011  . Left inguinal hernia   . MGUS (monoclonal gammopathy of unknown significance)   . Prostate cancer ([place]) 2011  . Sepsis ([place])   . Stress incontinence   . Stroke [facility])    2013  . Syncope 2012   after prostate surgery; neg neurology work up.    Past Surgical History:  Procedure Laterality Date  . COLONOSCOPY    . INGUINAL HERNIA REPAIR  06/01/2011   Procedure: HERNIA REPAIR INGUINAL ADULT;  Surgeon: Adin Hector, MD;  [facility];  Service: General;  Laterality: Left;  Left Inguinal Hernia Repair with Mesh  . ROBOT ASSISTED LAPAROSCOPIC RADICAL PROSTATECTOMY  jan 2012   da vinci  . SPINE SURGERY  2003 OR 2004   CERVICAL FUSION-STATES LIMITED ROM NECK  . URETHRAL SLING N/A 03/08/2016   Procedure: MALE SLING CYSTOSCOPY;  Surgeon: Bjorn Loser, MD;  [facility];  Service: Urology;  Laterality: N/A;  . WRIST SURGERY     right    Social History  Substance Use Topics  . Smoking status: Former Smoker    Packs/day: 0.50    Years:  1.00    Types: Cigarettes    Quit date: 06/28/1966  . Smokeless tobacco: Never Used  . Alcohol use Yes     Comment: 3 beers daily; liquor 2 glasses every other day    No Known Allergies Family History  Problem Relation Age of Onset  . Hypertension Mother   . Aneurysm Mother     brain  . Cancer Father     colon  . Colon cancer Father   . Heart attack Brother         Past medical history, social, surgical and family history all reviewed in electronic medical record.   Review of Systems: No headache, visual changes, nausea, vomiting, diarrhea, constipation, dizziness, abdominal pain, skin rash, fevers, chills, night sweats, weight loss, swollen lymph nodes, body aches,  joint swelling, muscle aches, chest pain, shortness of breath, mood changes.   Objective  There were no vitals taken for this visit.  General: No apparent distress alert and oriented x3 mood and affect normal, dressed appropriately.  HEENT: Pupils equal, extraocular movements intact  Respiratory: Patient's speak in full sentences and does not appear short of breath  Cardiovascular: No lower extremity edema, non tender, no erythema  Skin: Warm dry intact with no signs of infection or rash on extremities or on axial skeleton.  Abdomen: Soft nontender  Neuro: Cranial nerves II through XII are intact, neurovascularly intact in all extremities with 2+ DTRs and 2+ pulses.  Lymph: No lymphadenopathy of posterior or anterior cervical chain or axillae bilaterally.  Gait normal with good balance and coordination.  MSK:  Non tender with full range of motion and good stability and symmetric strength and tone of  elbows,  hip, knee and ankles bilaterally. Osteoarthritic changes of multiple joints   Wrist:left Mild swelling on the dorsal aspect of the left wrist ROM smooth and normal with good flexion and extension and ulnar/radial deviation that is symmetrical with opposite wrist. Diffuse tenderness of the wrist No snuffbox tenderness. No tenderness over Canal of Guyon. Strength 5/5 in all directions without pain. Negative Finkelstein, tinel's and phalens. Negative Watson's test. Hunter lateral risk unremarkable  Neck: Inspection shows loss of lordosis No palpable stepoffs. Positive Spurling's maneuver noted. Bilaterally Limited range of motion in flexion-extension as well as right side bending secondary to fusion Grip strength and sensation normal in bilateral hands Strength good C4 to T1 distribution, with continued weakness of the T1 distribution compared to the contralateral side No sensory change to C4 to T1 Negative Hoffman sign bilaterally Reflexes normal  Shoulder:bilateral Atrophy  of the shoulder girdle musculature bilaterally. Palpation is normal with no tenderness over AC joint or bicipital groove. ROM is full in all planes.mild crepitus with range of motion Rotator cuff strength 4/5 but symmetric signs of impingement with psitiveNeer and Hawkin's tests, empty can sign. Speeds and Yergason's tests normal. positive painful arc bilaterally No apprehension sign   Wrist had generalized tenderness with mild crepitus bilaterally. Grip strength is 4 out of 5 bilaterally. Negative de Quervain's sign with Wynn Maudlin. Negative carpal tunnel signs with Tinel's.    Impression and Recommendations:     This case required medical decision making of moderate complexity.

## 2016-06-01 ENCOUNTER — Ambulatory Visit: Payer: Medicare Other | Admitting: Family Medicine

## 2016-06-09 ENCOUNTER — Ambulatory Visit: Payer: Medicare Other | Admitting: Internal Medicine

## 2016-06-11 NOTE — Progress Notes (Signed)
[facility] [address], [place] [address] Phone: [phone] Subjective:    I'm seeing this patient by the request  of:  Cathlean Cower, MD   CC: left shoulder pain and neck pain f/u    [id number]  Nathan Wallace is a 66 y.o. male coming in with complaint of left shoulder pain and neck pain follow up. Of note patient does have a past medical history significant for C4 through C7 fusion. He was started on gabapentin at last visit and titrated up to 600 mg 3 times a day. Patient continues to have pain overall. Seems to be a dull, throbbing aching sensation. Seems to be more going on the right arm at this time. Patient states that the pain seems to be also localizing the right shoulder. Working him up at night. Did doing a lot more activity overhead as well. Has had known rotator cuff arthropathy previously. Feels that this is worsening symptoms at this time.  Patient is also complaining of right knee pain. Patient states that it seems to be him the front of the knee. Worse with putting pressure on the knee. Describes it as a dull, throbbing aching sensation. States that all his bones is seem to hurt overall. Seems to radiate up towards his back once he is been doing things for some time. Does have some swelling on the right knee as well. Patient is in the process of renovating a bathroom.    Patient states that he is having more chronic aches and pains all over. Seems to be as knees, thighs, lower back as well.  Previous x-rays in April 2015 showed fusion of C4-C7 and moderate severe degenerative disc disease at C6, C7-T1. Moderate arthritic changes at C2-C3 as well. X-rays of patient's left shoulder taken December 2017 the left side show that patient does have what seems to be calcific changes in the proximal humerus laterally. Possible loose body  Past Medical History:  Diagnosis Date  . AKI (acute kidney injury) ([place])   . Arthritis    RT KNEE AND  RT SHOULDER  . B12 deficiency   . Benign head tremor   . Chest tightness    comes and goes   . CVA (cerebrovascular accident due to intracerebral hemorrhage) ([place]) 08/03/2011  . Degenerative joint disease of knee, right   . Depression 07/26/2012  . GERD (gastroesophageal reflux disease)    NO RECENT PROBLEMS AND NO MEDS  . History of blood transfusion   . History of urinary tract infection   . Hyperlipidemia    border line  . Hypertension   . Impaired glucose tolerance 06/22/2011  . Left inguinal hernia   . MGUS (monoclonal gammopathy of unknown significance)   . Prostate cancer ([place]) 2011  . Sepsis ([place])   . Stress incontinence   . Stroke [facility])    2013  . Syncope 2012   after prostate surgery; neg neurology work up.    Past Surgical History:  Procedure Laterality Date  . COLONOSCOPY    . INGUINAL HERNIA REPAIR  06/01/2011   Procedure: HERNIA REPAIR INGUINAL ADULT;  Surgeon: Adin Hector, MD;  [facility];  Service: General;  Laterality: Left;  Left Inguinal Hernia Repair with Mesh  . ROBOT ASSISTED LAPAROSCOPIC RADICAL PROSTATECTOMY  jan 2012   da vinci  . SPINE SURGERY  2003 OR 2004   CERVICAL FUSION-STATES LIMITED ROM NECK  . URETHRAL SLING N/A 03/08/2016   Procedure: MALE SLING CYSTOSCOPY;  Surgeon: Bjorn Loser, MD;  [facility];  Service: Urology;  Laterality: N/A;  . WRIST SURGERY     right    Social History  Substance Use Topics  . Smoking status: Former Smoker    Packs/day: 0.50    Years: 1.00    Types: Cigarettes    Quit date: 06/28/1966  . Smokeless tobacco: Never Used  . Alcohol use Yes     Comment: 3 beers daily; liquor 2 glasses every other day    No Known Allergies Family History  Problem Relation Age of Onset  . Hypertension Mother   . Aneurysm Mother     brain  . Cancer Father     colon  . Colon cancer Father   . Heart attack Brother         Past medical history, social, surgical and family history all reviewed in  electronic medical record.   Review of Systems: No , visual changes, nausea, vomiting, diarrhea, constipation, dizziness, abdominal pain, skin rash, fevers, chills, night sweats, weight loss, swollen lymph nodes,chest pain, shortness of breath, Positive joint swelling, muscle aches, body aches, headaches, abdominal pain, mood changes.   Objective  Blood pressure 114/72, pulse 62, weight 160 lb (72.6 kg), SpO2 99 %.  Systems examined below as of 06/13/16 General: NAD A&O x3 mood, affect normal  HEENT: Pupils equal, extraocular movements intact no nystagmus Respiratory: not short of breath at rest or with speaking Cardiovascular: No lower extremity edema, non tender Skin: Warm dry intact with no signs of infection or rash on extremities or on axial skeleton. Abdomen: Soft nontender, no masses Neuro: Cranial nerves  intact, neurovascularly intact in all extremities with 2+ DTRs and 2+ pulses. Lymph: No lymphadenopathy appreciated today  Gait normal with good balance and coordination.  MSK:  Non tender with full range of motion and good stability and symmetric strength and tone of  elbows,  hip, and ankles bilaterally. Osteoarthritic changes of multiple joints    Neck: Inspection shows loss of lordosis No palpable stepoffs. Positive Spurling's maneuver noted. Bilaterally Limited range of motion in flexion-extension as well as right side bending secondary to fusionPossibly worse than previous exam Grip strength and sensation normal in bilateral hands 4-5 strength of the right side compared to the left sign. No sensory change to C4 to T1 Negative Hoffman sign bilaterally Reflexes normal  Shoulder:bilateral Atrophy of the shoulder girdle musculature bilaterally. Palpation is normal with no tenderness over AC joint or bicipital groove. ROM is full in all planes.mild crepitus with range of motion Rotator cuff strength 4/5 but symmetric Worsening impingement signs of the right side Speeds  and Yergason's tests normal. positive painful arc bilaterally No apprehension sign   Knee: Right Normal to inspection with no erythema or effusion or obvious bony abnormalities. Medial joint line pain ROM full in flexion and extension and lower leg rotation. Ligaments with solid consistent endpoints including ACL, PCL, LCL, MCL. Negative Mcmurray's, Apley's, and Thessalonian tests. painful patellar compression. Patellar glide with moderate crepitus. Patellar and quadriceps tendons unremarkable. Hamstring and quadriceps strength is normal.    After informed written and verbal consent, patient was seated on exam table. Right shoulder was prepped with alcohol swab and utilizing posterior approach, patient's right glenohumeral space was injected with 4:1  marcaine 0.5%: Kenalog 40mg /dL. Patient tolerated the procedure well without immediate complications.  After informed written and verbal consent, patient was seated on exam table. Right knee was prepped with alcohol swab and utilizing anterolateral approach, patient's  right knee space was injected with 4:1  marcaine 0.5%: Kenalog 40mg /dL. Patient tolerated the procedure well without immediate complications.    Impression and Recommendations:     This case required medical decision making of moderate complexity.

## 2016-06-13 ENCOUNTER — Encounter: Payer: Self-pay | Admitting: Family Medicine

## 2016-06-13 ENCOUNTER — Ambulatory Visit (INDEPENDENT_AMBULATORY_CARE_PROVIDER_SITE_OTHER): Payer: Medicare Other | Admitting: Family Medicine

## 2016-06-13 DIAGNOSIS — M25512 Pain in left shoulder: Secondary | ICD-10-CM | POA: Diagnosis not present

## 2016-06-13 DIAGNOSIS — M1711 Unilateral primary osteoarthritis, right knee: Secondary | ICD-10-CM | POA: Diagnosis not present

## 2016-06-13 DIAGNOSIS — M25511 Pain in right shoulder: Secondary | ICD-10-CM | POA: Diagnosis not present

## 2016-06-13 MED ORDER — VITAMIN D (ERGOCALCIFEROL) 1.25 MG (50000 UNIT) PO CAPS: 50000.0000 [IU] | ORAL_CAPSULE | ORAL | 0 refills | Status: DC

## 2016-06-13 NOTE — Assessment & Plan Note (Signed)
Patient given injection today and tolerated the procedure well. Patient has known arthritic changes but moderate nature. Patient has responded well to injections in the past but has been quite some time, multiple years. Patient does have topical anti-inflammatories. We discussed over-the-counter medications. Can do believe that with patient's history of prostate cancer in chemotherapy and radiation this is caused some of his joint pain. Follow-up again in 4 weeks. Could be a candidate for viscous supplementation

## 2016-06-13 NOTE — Assessment & Plan Note (Signed)
Injecting right shoulder today. Tolerated the procedure well. Cervical radiculopathy is within the differential. We discussed icing regimen. Which x-rays to do a which ones to avoid. Encourage patient to increase activity as tolerated. Patient had worsening symptoms we may need to change medical management.

## 2016-06-13 NOTE — Patient Instructions (Addendum)
Good to see you  We injected your knee and shoulder tjoday to buy you some time Once weekly vitamin D for next 12 weeks.  I am hoping this will help with some muscle strength and endurance.  Keep being active  I can repeat every 3 months if needed.  See me when you need me.  Maybe make an appointment in 10 weeks.

## 2016-06-15 ENCOUNTER — Telehealth: Payer: Self-pay | Admitting: Internal Medicine

## 2016-06-15 NOTE — Telephone Encounter (Signed)
Having a flare, should get better in next 24 hours.

## 2016-06-15 NOTE — Telephone Encounter (Signed)
Discussed with pt

## 2016-06-15 NOTE — Telephone Encounter (Signed)
Patient is calling in again saying his arm is no better. He is in a lot of pain. And would like to know what to do.

## 2016-06-15 NOTE — Telephone Encounter (Signed)
Pt called in and said that the injection that he had yesterday has hurt his arm.  She said that he cant even lift it up.  He has had a heating pad on it all morning.  What should he do?     Best number [phone]

## 2016-06-16 ENCOUNTER — Other Ambulatory Visit: Payer: Self-pay | Admitting: Family Medicine

## 2016-06-16 MED ORDER — DOXYCYCLINE HYCLATE 100 MG PO TABS: 100.0000 mg | ORAL_TABLET | Freq: Two times a day (BID) | ORAL | 0 refills | Status: DC

## 2016-06-16 NOTE — Progress Notes (Signed)
  Patient came in the office today. Having worsening pain in the right shoulder. No signs of infection with no red streaking. Patient does not have a fever. Did 7 doxycycline in case. We discussed further workup which patient declined. Patient has an appointment on Monday. We will see him at that time but told him if any worsening symptoms to seek medical attention immediately.

## 2016-06-20 ENCOUNTER — Ambulatory Visit: Payer: Medicare Other

## 2016-06-23 ENCOUNTER — Telehealth: Payer: Self-pay | Admitting: Family

## 2016-06-23 ENCOUNTER — Other Ambulatory Visit: Payer: Self-pay

## 2016-06-23 ENCOUNTER — Telehealth: Payer: Self-pay | Admitting: Internal Medicine

## 2016-06-23 DIAGNOSIS — M25511 Pain in right shoulder: Secondary | ICD-10-CM

## 2016-06-23 MED ORDER — TRAMADOL HCL 50 MG PO TABS: 50.0000 mg | ORAL_TABLET | Freq: Two times a day (BID) | ORAL | 0 refills | Status: DC | PRN

## 2016-06-23 MED ORDER — DOXYCYCLINE HYCLATE 100 MG PO TABS: 100.0000 mg | ORAL_TABLET | Freq: Two times a day (BID) | ORAL | 0 refills | Status: AC

## 2016-06-23 NOTE — Telephone Encounter (Signed)
Sent in both  He needs a CT scan of shoulder to rule out osteomyelitis then.  Please order.

## 2016-06-23 NOTE — Telephone Encounter (Signed)
Intended to call patient. We have told multiple times and is going to the emergency room. Offered him a 1045 appointment tomorrow with me. Patient declined. Sent in medications to further cover in case there is any infectious etiology. Patient sent for a CT scan of the shoulder. Unable to do MRI secondary to hardware in patient's neck.

## 2016-06-23 NOTE — Telephone Encounter (Signed)
Referral sent in for CT and Rx faxed to pharmacy.

## 2016-06-23 NOTE — Telephone Encounter (Signed)
Patient states he has finished medication for infection in shoulder.  States he still has infection and pain.  He is requesting medication for both.  Patient uses [facility] on [address]  Please follow up in regard.

## 2016-06-26 ENCOUNTER — Emergency Department (HOSPITAL_COMMUNITY)
Admission: EM | Admit: 2016-06-26 | Discharge: 2016-06-26 | Disposition: A | Discharge status: Home / Self Care | Payer: Medicare Other | Attending: Emergency Medicine | Admitting: Emergency Medicine

## 2016-06-26 ENCOUNTER — Encounter (HOSPITAL_COMMUNITY): Payer: Self-pay | Admitting: Emergency Medicine

## 2016-06-26 ENCOUNTER — Emergency Department (HOSPITAL_COMMUNITY): Payer: Medicare Other

## 2016-06-26 DIAGNOSIS — S43004A Unspecified dislocation of right shoulder joint, initial encounter: Secondary | ICD-10-CM | POA: Diagnosis not present

## 2016-06-26 DIAGNOSIS — Z7982 Long term (current) use of aspirin: Secondary | ICD-10-CM | POA: Insufficient documentation

## 2016-06-26 DIAGNOSIS — Y929 Unspecified place or not applicable: Secondary | ICD-10-CM | POA: Diagnosis not present

## 2016-06-26 DIAGNOSIS — S4991XA Unspecified injury of right shoulder and upper arm, initial encounter: Secondary | ICD-10-CM | POA: Diagnosis present

## 2016-06-26 DIAGNOSIS — S53014A Anterior dislocation of right radial head, initial encounter: Secondary | ICD-10-CM | POA: Diagnosis not present

## 2016-06-26 DIAGNOSIS — I10 Essential (primary) hypertension: Secondary | ICD-10-CM | POA: Insufficient documentation

## 2016-06-26 DIAGNOSIS — Y9389 Activity, other specified: Secondary | ICD-10-CM | POA: Insufficient documentation

## 2016-06-26 DIAGNOSIS — S43014A Anterior dislocation of right humerus, initial encounter: Secondary | ICD-10-CM | POA: Insufficient documentation

## 2016-06-26 DIAGNOSIS — Z87891 Personal history of nicotine dependence: Secondary | ICD-10-CM | POA: Insufficient documentation

## 2016-06-26 DIAGNOSIS — X500XXA Overexertion from strenuous movement or load, initial encounter: Secondary | ICD-10-CM | POA: Diagnosis not present

## 2016-06-26 DIAGNOSIS — Z8673 Personal history of transient ischemic attack (TIA), and cerebral infarction without residual deficits: Secondary | ICD-10-CM | POA: Diagnosis not present

## 2016-06-26 DIAGNOSIS — Z8546 Personal history of malignant neoplasm of prostate: Secondary | ICD-10-CM | POA: Diagnosis not present

## 2016-06-26 DIAGNOSIS — Y999 Unspecified external cause status: Secondary | ICD-10-CM | POA: Diagnosis not present

## 2016-06-26 DIAGNOSIS — S43034A Inferior dislocation of right humerus, initial encounter: Secondary | ICD-10-CM | POA: Diagnosis not present

## 2016-06-26 MED ORDER — ETOMIDATE 2 MG/ML IV SOLN
7.0000 mg | Freq: Once | INTRAVENOUS | Status: AC
  Administered 2016-06-26: 7 mg via INTRAVENOUS
  Filled 2016-06-26: qty 10

## 2016-06-26 MED ORDER — KETOROLAC TROMETHAMINE 30 MG/ML IJ SOLN
30.0000 mg | Freq: Once | INTRAMUSCULAR | Status: AC
  Administered 2016-06-26: 30 mg via INTRAVENOUS
  Filled 2016-06-26: qty 1

## 2016-06-26 MED ORDER — MIDAZOLAM HCL 2 MG/2ML IJ SOLN
2.0000 mg | Freq: Once | INTRAMUSCULAR | Status: AC
  Administered 2016-06-26: 2 mg via INTRAVENOUS
  Filled 2016-06-26: qty 2

## 2016-06-26 MED ORDER — OXYCODONE-ACETAMINOPHEN 5-325 MG PO TABS: 1.0000 | ORAL_TABLET | ORAL | 0 refills | Status: DC | PRN

## 2016-06-26 MED ORDER — MORPHINE SULFATE (PF) 4 MG/ML IV SOLN
4.0000 mg | Freq: Once | INTRAVENOUS | Status: AC
  Administered 2016-06-26: 4 mg via INTRAVENOUS
  Filled 2016-06-26: qty 1

## 2016-06-26 MED ORDER — SODIUM CHLORIDE 0.9 % IV BOLUS (SEPSIS)
1000.0000 mL | Freq: Once | INTRAVENOUS | Status: AC
  Administered 2016-06-26: 1000 mL via INTRAVENOUS

## 2016-06-26 MED ORDER — ONDANSETRON HCL 4 MG/2ML IJ SOLN
4.0000 mg | Freq: Once | INTRAMUSCULAR | Status: AC
  Administered 2016-06-26: 4 mg via INTRAVENOUS
  Filled 2016-06-26: qty 2

## 2016-06-26 NOTE — ED Notes (Signed)
Bed: WA19 Expected date:  Expected time:  Means of arrival:  Comments: 

## 2016-06-26 NOTE — ED Triage Notes (Signed)
Pt had R shoulder injury approximately 10 days ago while lifting self out of bed, seen by sports med and received a cortisone injection, has taken tramadol for pain which patient uses for arthritis, pain worsening and difficulty moving R arm. Pt with tingling and dullness in R arm. Pt can bend at elbow but unable to raise arm above the head.

## 2016-06-26 NOTE — ED Provider Notes (Signed)
[facility] DEPT Provider Note   CSN: [id number] Arrival date & time: 06/26/16  0818     History   Chief Complaint Chief Complaint  Patient presents with  . Shoulder Injury    HPI Nathan Wallace is a 66 y.o. male.  Right shoulder pain for 2 weeks. No specific trauma. Patient was initially evaluated by a physician in the community who injected his shoulder with a steroid medication. He continues to have pain particularly with movement. Severity is moderate.      Past Medical History:  Diagnosis Date  . AKI (acute kidney injury) ([place])   . Arthritis    RT KNEE AND RT SHOULDER  . B12 deficiency   . Benign head tremor   . Chest tightness    comes and goes   . CVA (cerebrovascular accident due to intracerebral hemorrhage) ([place]) 08/03/2011  . Degenerative joint disease of knee, right   . Depression 07/26/2012  . GERD (gastroesophageal reflux disease)    NO RECENT PROBLEMS AND NO MEDS  . History of blood transfusion   . History of urinary tract infection   . Hyperlipidemia    border line  . Hypertension   . Impaired glucose tolerance 06/22/2011  . Left inguinal hernia   . MGUS (monoclonal gammopathy of unknown significance)   . Prostate cancer ([place]) 2011  . Sepsis ([place])   . Stress incontinence   . Stroke [name])    2013  . Syncope 2012   after prostate surgery; neg neurology work up.     Patient Active Problem List   Diagnosis Date Noted  . Bilateral shoulder pain 03/22/2016  . Male stress incontinence 03/08/2016  . Muscle cramp 01/06/2016  . Hypotension 01/06/2016  . AKI (acute kidney injury) ([place]) 01/06/2016  . Metabolic acidosis 23/76/2831  . Lactic acidosis 01/06/2016  . Macrocytic anemia with vitamin B12 deficiency 01/05/2016  . Bilateral wrist pain 11/26/2015  . Polyarthralgia 09/23/2015  . History of prostate cancer 10/06/2013  . Cervical disc disorder with radiculopathy of cervical region 07/16/2013  . Left rotator cuff tear arthropathy 03/01/2013    . Anemia, unspecified 02/21/2013  . Acute gouty arthritis 11/09/2012  . Depression 07/26/2012  . Impingement syndrome of right shoulder 07/26/2012  . MGUS (monoclonal gammopathy of unknown significance)   . B12 deficiency 12/15/2011  . Peripheral neuropathy ([place]) 12/13/2011  . CVA (cerebrovascular accident due to intracerebral hemorrhage) ([place]) 08/03/2011  . Degenerative joint disease of knee, right   . Benign head tremor   . Encounter for long-term (current) use of high-risk medication 06/23/2011  . Right knee pain 06/23/2011  . HTN (hypertension) 06/23/2011  . Impaired glucose tolerance 06/22/2011  . Encounter for preventative adult health care exam with abnormal findings 06/22/2011  . Left inguinal hernia 06/01/2011  . Left shoulder pain 12/29/2008  . Hyperlipidemia 08/26/2008  . ERECTILE DYSFUNCTION 08/26/2008  . ALLERGIC RHINITIS 08/26/2008  . GERD 08/26/2008  . DIVERTICULOSIS, COLON 08/26/2008  . LOW BACK PAIN 08/26/2008  . RASH-NONVESICULAR 08/26/2008    Past Surgical History:  Procedure Laterality Date  . COLONOSCOPY    . INGUINAL HERNIA REPAIR  06/01/2011   Procedure: HERNIA REPAIR INGUINAL ADULT;  Surgeon: Adin Hector, MD;  Location: [facility]S;  Service: General;  Laterality: Left;  Left Inguinal Hernia Repair with Mesh  . ROBOT ASSISTED LAPAROSCOPIC RADICAL PROSTATECTOMY  jan 2012   da vinci  . SPINE SURGERY  2003 OR 2004   CERVICAL FUSION-STATES LIMITED ROM NECK  . URETHRAL SLING N/A  03/08/2016   Procedure: MALE SLING CYSTOSCOPY;  Surgeon: Bjorn Loser, MD;  Location: [facility];  Service: Urology;  Laterality: N/A;  . WRIST SURGERY     right        Home Medications    Prior to Admission medications   Medication Sig Start Date End Date Taking? Authorizing Provider  allopurinol (ZYLOPRIM) 300 MG tablet Take 1 tablet (300 mg total) by mouth daily. 11/03/15  Yes Lyndal Pulley, DO  amLODipine (NORVASC) 5 MG tablet TAKE 1 TABLET BY MOUTH  DAILY 01/11/16  Yes  Biagio Borg, MD  aspirin 81 MG chewable tablet Chew 1 tablet (81 mg total) by mouth daily. 03/14/16  Yes Bjorn Loser, MD  atorvastatin (LIPITOR) 40 MG tablet TAKE 1 TABLET BY MOUTH  DAILY 04/18/16  Yes Biagio Borg, MD  Cholecalciferol (VITAMIN D-3) 1000 units CAPS Take 2,000 Units by mouth daily.    Yes Historical Provider, MD  colchicine 0.6 MG tablet Take 1 tablet (0.6 mg total) by mouth 2 (two) times daily. For 5 days when having flare. 11/03/15  Yes Lyndal Pulley, DO  Cyanocobalamin (B-12 COMPLIANCE INJECTION IJ) Inject 1 Dose as directed every 30 (thirty) days.   Yes Historical Provider, MD  diclofenac sodium (VOLTAREN) 1 % GEL Apply 4 g topically 4 (four) times daily as needed. 03/22/16  Yes Biagio Borg, MD  doxycycline (VIBRA-TABS) 100 MG tablet Take 1 tablet (100 mg total) by mouth 2 (two) times daily. 06/23/16 06/30/16 Yes Lyndal Pulley, DO  gabapentin (NEURONTIN) 300 MG capsule Take 2 capsules (600 mg total) by mouth 3 (three) times daily. Patient taking differently: Take 600 mg by mouth 2 (two) times daily.  08/31/15  Yes Lyndal Pulley, DO  HYDROcodone-acetaminophen (NORCO/VICODIN) 5-325 MG tablet Take 1-2 tablets by mouth every 4 (four) hours as needed for moderate pain. 03/09/16  Yes Bjorn Loser, MD  irbesartan (AVAPRO) 300 MG tablet TAKE 1 TABLET BY MOUTH AT  BEDTIME 01/25/16  Yes Golden Circle, FNP  naproxen sodium (ANAPROX) 220 MG tablet Take 220 mg by mouth 3 (three) times daily with meals.   Yes Historical Provider, MD  PRESCRIPTION MEDICATION 1 Syringe by Intracavernosal route every other day as needed (10-30 minutes before you have sex for ED). Matrix (Papaverine 30mg  Phentolamine 1mg  Alprostadil 78mcg per ml injection)  erectile dysfunction injection every other day as needed for sex   Yes Historical Provider, MD  tiZANidine (ZANAFLEX) 4 MG tablet Take 1 tablet by mouth 3  times daily as needed for  muscle spasm(s) 02/11/15  Yes Biagio Borg, MD  traMADol (ULTRAM) 50  MG tablet Take 1 tablet (50 mg total) by mouth 2 (two) times daily as needed. 06/23/16  Yes Lyndal Pulley, DO  vitamin C (ASCORBIC ACID) 500 MG tablet Take 1,500 mg by mouth daily.   Yes Historical Provider, MD  Vitamin D, Ergocalciferol, (DRISDOL) 50000 units CAPS capsule Take 1 capsule (50,000 Units total) by mouth every 7 (seven) days. 06/13/16  Yes Lyndal Pulley, DO  oxyCODONE-acetaminophen (PERCOCET) 5-325 MG tablet Take 1-2 tablets by mouth every 4 (four) hours as needed. 06/26/16   Nat Christen, MD    Family History Family History  Problem Relation Age of Onset  . Hypertension Mother   . Aneurysm Mother     brain  . Cancer Father     colon  . Colon cancer Father   . Heart attack Brother     Social History Social  History  Substance Use Topics  . Smoking status: Former Smoker    Packs/day: 0.50    Years: 1.00    Types: Cigarettes    Quit date: 06/28/1966  . Smokeless tobacco: Never Used  . Alcohol use Yes     Comment: 3 beers daily; liquor 2 glasses every other day      Allergies   Patient has no known allergies.   Review of Systems Review of Systems  All other systems reviewed and are negative.    Physical Exam Updated Vital Signs BP 139/87   Pulse 60   Temp 98 F (36.7 C)   Resp 17   SpO2 100%   Physical Exam  Constitutional: He is oriented to person, place, and time. He appears well-developed and well-nourished.  HENT:  Head: Normocephalic and atraumatic.  Eyes: Conjunctivae are normal.  Neck: Neck supple.  Cardiovascular: Normal rate and regular rhythm.   Pulmonary/Chest: Effort normal and breath sounds normal.  Abdominal: Soft. Bowel sounds are normal.  Musculoskeletal:  Generalized tenderness circumventing the right shoulder. Pain with range of motion.  Neurological: He is alert and oriented to person, place, and time.  Skin: Skin is warm and dry.  Psychiatric: He has a normal mood and affect. His behavior is normal.  Nursing note and vitals  reviewed.    ED Treatments / Results  Labs (all labs ordered are listed, but only abnormal results are displayed) Labs Reviewed - No data to display  EKG  EKG Interpretation None       Radiology Dg Shoulder Right  Result Date: 06/26/2016 CLINICAL DATA:  66 year old male new with 2 weeks of right-sided shoulder pain EXAM: RIGHT SHOULDER - 2+ VIEW COMPARISON:  Prior radiographs of the right shoulder 03/22/2016 FINDINGS: Anterior and inferior dislocation of the humeral head with respect to the glenoid. No definite acute fracture or malalignment. The visualized chest is unremarkable. No lytic or blastic osseous lesion. Soft tissues are unremarkable. IMPRESSION: Anterior and inferior right shoulder dislocation without evidence of fracture. Electronically Signed   By: Jacqulynn Cadet M.D.   On: 06/26/2016 09:14   Dg Shoulder Right Portable  Result Date: 06/26/2016 CLINICAL DATA:  Shoulder dislocation, status postreduction EXAM: PORTABLE RIGHT SHOULDER COMPARISON:  06/26/2016 FINDINGS: Internal rotation and transscapular views demonstrate satisfactory reduction of the glenohumeral joint. Suspected Hill-Sachs impaction of the humeral head. Ossification along the coracoclavicular ligament observed. Lower cervical plate and screw fixator. Degenerative spurring at the [facility] joint. IMPRESSION: 1. Successful reduction.  Suspected underlying Hill-Sachs impaction. Electronically Signed   By: Van Clines M.D.   On: 06/26/2016 14:13    Procedures Reduction of dislocation Date/Time: 06/26/2016 1:00 PM Performed by: Nat Christen Authorized by: Nat Christen  Consent: Verbal consent obtained. Written consent obtained. Risks and benefits: risks, benefits and alternatives were discussed Consent given by: patient Patient understanding: patient states understanding of the procedure being performed Relevant documents: relevant documents present and verified Test results: test results available and  properly labeled Imaging studies: imaging studies available Patient identity confirmed: verbally with patient Local anesthesia used: no  Anesthesia: Local anesthesia used: no  Sedation: Patient sedated: yes Sedatives: etomidate and midazolam Analgesia: morphine Sedation start date/time: 06/26/2016 1:00 PM Sedation end date/time: 06/26/2016 1:18 PM Vitals: Vital signs were monitored during sedation. Patient tolerance: Patient tolerated the procedure well with no immediate complications Comments: Patient was placed under the conscious sedation protocol. He was monitored continuously with normal vital signs. Versed 2 mg and etomidate 7 mg given intravenously.  A reasonable level sedation was obtained. The shoulder dislocation was reduced with external rotation and abduction. A click was appreciated. Full range of motion of the shoulder joint after procedure. Postoperative shoulder x-ray pending.    (including critical care time)  Medications Ordered in ED Medications  ondansetron (ZOFRAN) injection 4 mg (4 mg Intravenous Given 06/26/16 1214)  sodium chloride 0.9 % bolus 1,000 mL (0 mLs Intravenous Stopped 06/26/16 1330)  morphine 4 MG/ML injection 4 mg (4 mg Intravenous Given 06/26/16 1214)  ketorolac (TORADOL) 30 MG/ML injection 30 mg (30 mg Intravenous Given 06/26/16 1216)  midazolam (VERSED) injection 2 mg (2 mg Intravenous Given 06/26/16 1305)  etomidate (AMIDATE) injection 7 mg (7 mg Intravenous Given 06/26/16 1307)     Initial Impression / Assessment and Plan / ED Course  I have reviewed the triage vital signs and the nursing notes.  Pertinent labs & imaging results that were available during my care of the patient were reviewed by me and considered in my medical decision making (see chart for details).     Patient presents with an anterior-inferior right shoulder dislocation. The shoulder was reduced with conscious sedation and manipulation. Patient will follow-up with orthopedics.  Sling, ice, pain medicine. Post x-ray reviewed. There may be an underlying Hill-Sachs impaction.  Final Clinical Impressions(s) / ED Diagnoses   Final diagnoses:  Anterior dislocation of right shoulder, initial encounter    New Prescriptions New Prescriptions   OXYCODONE-ACETAMINOPHEN (PERCOCET) 5-325 MG TABLET    Take 1-2 tablets by mouth every 4 (four) hours as needed.     Nat Christen, MD 06/26/16 [phone]

## 2016-06-26 NOTE — Discharge Instructions (Signed)
You have dislocated the right shoulder. We were able to put it back in the joint. Recommend ice pack, sling, pain medicine, follow-up with orthopedics. Phone number given. Do not recommend heat.

## 2016-06-26 NOTE — ED Notes (Signed)
Patient transported to X-ray 

## 2016-06-27 ENCOUNTER — Telehealth: Payer: Self-pay

## 2016-06-27 ENCOUNTER — Ambulatory Visit (INDEPENDENT_AMBULATORY_CARE_PROVIDER_SITE_OTHER): Payer: Medicare Other

## 2016-06-27 DIAGNOSIS — E538 Deficiency of other specified B group vitamins: Secondary | ICD-10-CM | POA: Diagnosis not present

## 2016-06-27 MED ORDER — CYANOCOBALAMIN 1000 MCG/ML IJ SOLN
1000.0000 ug | Freq: Once | INTRAMUSCULAR | Status: AC
  Administered 2016-06-27: 1000 ug via INTRAMUSCULAR

## 2016-06-27 NOTE — Telephone Encounter (Signed)
Last phone should say patient coming in for b12 injection

## 2016-06-27 NOTE — Telephone Encounter (Signed)
Patient is coming in today for b12 def----i'm not showing b12 lab since at least 2015---are you ok with patient getting lab at next visit right before injection to see current lab value----please advise, thanks

## 2016-06-27 NOTE — Telephone Encounter (Signed)
Yes of course, but:  It may not be actually needed, however, for persons getting regular B12 injections, which are so effective that I have never seen a low B12 level in a patient getting regular shots.   If patient has not been getting regular shots, then this of course would not apply, thanks

## 2016-06-28 NOTE — Telephone Encounter (Signed)
error 

## 2016-06-29 DIAGNOSIS — S43001A Unspecified subluxation of right shoulder joint, initial encounter: Secondary | ICD-10-CM | POA: Diagnosis not present

## 2016-07-04 ENCOUNTER — Other Ambulatory Visit: Payer: Medicare Other

## 2016-07-05 DIAGNOSIS — M25511 Pain in right shoulder: Secondary | ICD-10-CM | POA: Diagnosis not present

## 2016-07-07 ENCOUNTER — Other Ambulatory Visit: Payer: Self-pay | Admitting: Family Medicine

## 2016-07-11 DIAGNOSIS — M25511 Pain in right shoulder: Secondary | ICD-10-CM | POA: Diagnosis not present

## 2016-07-11 DIAGNOSIS — S43001D Unspecified subluxation of right shoulder joint, subsequent encounter: Secondary | ICD-10-CM | POA: Diagnosis not present

## 2016-07-28 ENCOUNTER — Ambulatory Visit (INDEPENDENT_AMBULATORY_CARE_PROVIDER_SITE_OTHER): Payer: Medicare Other | Admitting: General Practice

## 2016-07-28 ENCOUNTER — Telehealth: Payer: Self-pay

## 2016-07-28 ENCOUNTER — Other Ambulatory Visit (INDEPENDENT_AMBULATORY_CARE_PROVIDER_SITE_OTHER): Payer: Medicare Other

## 2016-07-28 DIAGNOSIS — E538 Deficiency of other specified B group vitamins: Secondary | ICD-10-CM | POA: Diagnosis not present

## 2016-07-28 LAB — VITAMIN B12: Vitamin B-12: 339 pg/mL (ref 211–911)

## 2016-07-28 MED ORDER — CYANOCOBALAMIN 1000 MCG/ML IJ SOLN
1000.0000 ug | Freq: Once | INTRAMUSCULAR | Status: AC
Start: 2016-07-28 — End: 2016-07-28
  Administered 2016-07-28: 1000 ug via INTRAMUSCULAR

## 2016-07-28 NOTE — Telephone Encounter (Signed)
error 

## 2016-08-10 ENCOUNTER — Other Ambulatory Visit: Payer: Self-pay | Admitting: Orthopedic Surgery

## 2016-08-11 NOTE — Pre-Procedure Instructions (Signed)
Silvestre Mines  08/11/2016      [id number] [address] [place], [place] [address] Phone: [phone] Fax: [phone]  [place], [place] [name] [address] [place] [address] [place] [address] Phone: [phone] Fax: [place], [place] [address] [place] [address] Phone: [phone] Fax: [phone]    Your procedure is scheduled on Tues. May 15  Report to [facility] Admitting at 9:00 A.M.  Call this number if you have problems the morning of surgery:  [phone]   Remember:  Do not eat food or drink liquids after midnight on Mon. [id number]   Take these medicines the morning of surgery with A SIP OF WATER : alloupurinol, amlodipine (norvasc), gabapentin, hydrocodone or tramadol if needed, tizanidine (zanaflex),              1 week prior to surgery stop: aspirin, advil, motrin, aleve (naproxen), ibuprofen, BC Powders, Goody's, diclofenac sodium (voltaren) gel, vitamins/herbal medicines   Do not wear jewelry, make-up or nail polish.  Do not wear lotions, powders, or perfumes, or deoderant.  Do not shave 48 hours prior to surgery.  Men may shave face and neck.  Do not bring valuables to the hospital.  [facility] is not responsible for any belongings or valuables.  Contacts, dentures or bridgework may not be worn into surgery.  Leave your suitcase in the car.  After surgery it may be brought to your room.  For patients admitted to the hospital, discharge time will be determined by your treatment team.  Patients discharged the day of surgery will not be allowed to drive home.   Name and phone number of your driver:    Special instructions:  [place]- Preparing For Surgery  Before surgery, you can play an important role. Because skin is not sterile, your skin needs to be as free of  germs as possible. You can reduce the number of germs on your skin by washing with CHG (chlorahexidine gluconate) Soap before surgery.  CHG is an antiseptic cleaner which kills germs and bonds with the skin to continue killing germs even after washing.  Please do not use if you have an allergy to CHG or antibacterial soaps. If your skin becomes reddened/irritated stop using the CHG.  Do not shave (including legs and underarms) for at least 48 hours prior to first CHG shower. It is OK to shave your face.  Please follow these instructions carefully.   1. Shower the NIGHT BEFORE SURGERY and the MORNING OF SURGERY with CHG.   2. If you chose to wash your hair, wash your hair first as usual with your normal shampoo.  3. After you shampoo, rinse your hair and body thoroughly to remove the shampoo.  4. Use CHG as you would any other liquid soap. You can apply CHG directly to the skin and wash gently with a scrungie or a clean washcloth.   5. Apply the CHG Soap to your body ONLY FROM THE NECK DOWN.  Do not use on open wounds or open sores. Avoid contact with your eyes, ears, mouth and genitals (private parts). Wash genitals (private parts) with your normal soap.  6. Wash thoroughly, paying special attention to the area where your surgery will be performed.  7. Thoroughly rinse your body with warm water from the neck down.  8. DO  NOT shower/wash with your normal soap after using and rinsing off the CHG Soap.  9. Pat yourself dry with a CLEAN TOWEL.   10. Wear CLEAN PAJAMAS   11. Place CLEAN SHEETS on your bed the night of your first shower and DO NOT SLEEP WITH PETS.    Day of Surgery: Do not apply any deodorants/lotions. Please wear clean clothes to the hospital/surgery center.      Please read over the following fact sheets that you were given. Coughing and Deep Breathing, MRSA Information and Surgical Site Infection Prevention

## 2016-08-12 ENCOUNTER — Encounter (HOSPITAL_COMMUNITY)
Admission: RE | Admit: 2016-08-12 | Discharge: 2016-08-12 | Disposition: A | Discharge status: Home / Self Care | Payer: Medicare Other | Source: Ambulatory Visit | Attending: Orthopedic Surgery | Admitting: Orthopedic Surgery

## 2016-08-12 ENCOUNTER — Encounter (HOSPITAL_COMMUNITY): Payer: Self-pay

## 2016-08-12 DIAGNOSIS — M12811 Other specific arthropathies, not elsewhere classified, right shoulder: Secondary | ICD-10-CM | POA: Diagnosis not present

## 2016-08-12 DIAGNOSIS — Z01812 Encounter for preprocedural laboratory examination: Secondary | ICD-10-CM | POA: Insufficient documentation

## 2016-08-12 HISTORY — DX: Personal history of urinary calculi: Z87.442

## 2016-08-12 LAB — CBC
HEMATOCRIT: 39.4 % (ref 39.0–52.0)
HEMOGLOBIN: 13.3 g/dL (ref 13.0–17.0)
MCH: 33.3 pg (ref 26.0–34.0)
MCHC: 33.8 g/dL (ref 30.0–36.0)
MCV: 98.7 fL (ref 78.0–100.0)
Platelets: 283 10*3/uL (ref 150–400)
RBC: 3.99 MIL/uL — ABNORMAL LOW (ref 4.22–5.81)
RDW: 13.8 % (ref 11.5–15.5)
WBC: 7.4 10*3/uL (ref 4.0–10.5)

## 2016-08-12 LAB — COMPREHENSIVE METABOLIC PANEL
ALT: 26 U/L (ref 17–63)
AST: 26 U/L (ref 15–41)
Albumin: 4.1 g/dL (ref 3.5–5.0)
Alkaline Phosphatase: 63 U/L (ref 38–126)
Anion gap: 9 (ref 5–15)
BILIRUBIN TOTAL: 1.1 mg/dL (ref 0.3–1.2)
BUN: 13 mg/dL (ref 6–20)
CO2: 28 mmol/L (ref 22–32)
Calcium: 9.2 mg/dL (ref 8.9–10.3)
Chloride: 108 mmol/L (ref 101–111)
Creatinine, Ser: 1.07 mg/dL (ref 0.61–1.24)
GFR calc Af Amer: >60 mL/min (ref >60)
Glucose, Bld: 91 mg/dL (ref 65–99)
POTASSIUM: 3.6 mmol/L (ref 3.5–5.1)
Sodium: 145 mmol/L (ref 135–145)
TOTAL PROTEIN: 7.1 g/dL (ref 6.5–8.1)

## 2016-08-12 LAB — SURGICAL PCR SCREEN
MRSA, PCR: NEGATIVE
STAPHYLOCOCCUS AUREUS: NEGATIVE

## 2016-08-12 NOTE — Progress Notes (Signed)
PCP: Dr. Cathlean Cower

## 2016-08-15 ENCOUNTER — Encounter (HOSPITAL_COMMUNITY): Payer: Self-pay | Admitting: Emergency Medicine

## 2016-08-15 NOTE — Progress Notes (Addendum)
Anesthesia Chart Review:  Pt is a 66 year old male scheduled for R total shoulder arthroplasty on 08/23/2016 with Marchia Bond, MD.   - PCP is Cathlean Cower, MD  PMH includes:  Hemorrhagic stroke (2013), HTN, MGUS,  impaired glucose tolerance, hyperlipidemia, prostate cancer, GERD. Former smoker. BMI 21. S/p male sling cystoscopy 03/08/16. S/p inguinal hernia repair 06/01/11.   Medications include: Amlodipine, ASA 81 mg, Lipitor, irbesartan  Preoperative labs reviewed.    CXR 01/06/16: No active cardiopulmonary disease.  EKG 01/07/16: Sinus rhythm. Borderline prolonged PR interval. Anteroseptal infarct, age indeterminate. Baseline wander in lead(s) V2  Echo 06/28/11:  - Left ventricle: The cavity size was normal. Wall thickness was increased in a pattern of mild LVH. Systolic function was normal. The estimated ejection fraction was in the range of 55% to 65%. Wall motion was normal; there were noregional wall motion abnormalities. Doppler parameters areconsistent with abnormal left ventricular relaxation (grade 1 diastolic dysfunction). - Aortic valve: Trivial regurgitation. - Mitral valve: Mild regurgitation. - Atrial septum: No defect or patent foramen ovale was identified.  If no changes, I anticipate pt can proceed with surgery as scheduled.   Willeen Cass, FNP-BC [facility] Short Stay Surgical Center/Anesthesiology Phone: [phone] 08/15/2016 4:32 PM

## 2016-08-17 DIAGNOSIS — C61 Malignant neoplasm of prostate: Secondary | ICD-10-CM | POA: Diagnosis not present

## 2016-08-20 NOTE — Progress Notes (Signed)
Patient called reporting that he has a rash from poison oak. Recommended that patient contact Dr. Mardelle Matte Monday due to reports of him having rash on bilateral arms

## 2016-08-23 ENCOUNTER — Encounter (HOSPITAL_COMMUNITY): Admission: RE | Payer: Self-pay | Source: Ambulatory Visit

## 2016-08-23 ENCOUNTER — Inpatient Hospital Stay (HOSPITAL_COMMUNITY): Admission: RE | Admit: 2016-08-23 | Payer: Medicare Other | Source: Ambulatory Visit | Admitting: Orthopedic Surgery

## 2016-08-23 SURGERY — ARTHROPLASTY, SHOULDER, TOTAL
Anesthesia: General | Site: Shoulder | Laterality: Right

## 2016-08-28 ENCOUNTER — Other Ambulatory Visit: Payer: Self-pay | Admitting: Family Medicine

## 2016-08-28 ENCOUNTER — Other Ambulatory Visit: Payer: Self-pay | Admitting: Internal Medicine

## 2016-08-29 ENCOUNTER — Ambulatory Visit (INDEPENDENT_AMBULATORY_CARE_PROVIDER_SITE_OTHER): Payer: Medicare Other

## 2016-08-29 DIAGNOSIS — E538 Deficiency of other specified B group vitamins: Secondary | ICD-10-CM

## 2016-08-29 MED ORDER — CYANOCOBALAMIN 1000 MCG/ML IJ SOLN
1000.0000 ug | Freq: Once | INTRAMUSCULAR | Status: AC
  Administered 2016-08-29: 1000 ug via INTRAMUSCULAR

## 2016-08-29 NOTE — Telephone Encounter (Signed)
Refill done.  

## 2016-09-14 DIAGNOSIS — C61 Malignant neoplasm of prostate: Secondary | ICD-10-CM | POA: Diagnosis not present

## 2016-09-29 ENCOUNTER — Ambulatory Visit (INDEPENDENT_AMBULATORY_CARE_PROVIDER_SITE_OTHER): Payer: Medicare Other | Admitting: General Practice

## 2016-09-29 DIAGNOSIS — E538 Deficiency of other specified B group vitamins: Secondary | ICD-10-CM | POA: Diagnosis not present

## 2016-09-29 MED ORDER — CYANOCOBALAMIN 1000 MCG/ML IJ SOLN
1000.0000 ug | Freq: Once | INTRAMUSCULAR | Status: AC
  Administered 2016-09-29: 1000 ug via INTRAMUSCULAR

## 2016-10-14 ENCOUNTER — Other Ambulatory Visit: Payer: Self-pay | Admitting: Orthopedic Surgery

## 2016-10-14 NOTE — Pre-Procedure Instructions (Signed)
    Nathan Wallace  10/14/2016      [id number] [address], [place] [address] Phone: [phone] Fax: [phone]  [place], [facility] [address] [place] [address] [place] [address] Phone: [phone] Fax: [place], [place] [address] [place] [address] Phone: [phone] Fax: [phone]    Your procedure is scheduled on --10/25/16.  Report to [facility] Admitting at 825 A.M.  Call this number if you have problems the morning of surgery:  [phone]   Remember:  Do not eat food or drink liquids after midnight.  Take these medicines the morning of surgery with A SIP OF WATER --allopurinol,norvasc,neurontin,hydrocodone   Do not wear jewelry, make-up or nail polish.  Do not wear lotions, powders, or perfumes, or deoderant.  Do not shave 48 hours prior to surgery.  Men may shave face and neck.  Do not bring valuables to the hospital.  [facility] is not responsible for any belongings or valuables.  Contacts, dentures or bridgework may not be worn into surgery.  Leave your suitcase in the car.  After surgery it may be brought to your room.  For patients admitted to the hospital, discharge time will be determined by your treatment team.  Patients discharged the day of surgery will not be allowed to drive home.   Name and phone number of your driver:   Special instructions:  Do not take any aspirin,anti-inflammatories,vitamins,or herbal supplements 5-7 days prior to surgery.  Please read over the following fact sheets that you were given. MRSA Information

## 2016-10-16 ENCOUNTER — Other Ambulatory Visit: Payer: Self-pay | Admitting: Family

## 2016-10-16 ENCOUNTER — Other Ambulatory Visit: Payer: Self-pay | Admitting: Internal Medicine

## 2016-10-17 ENCOUNTER — Encounter (HOSPITAL_COMMUNITY): Payer: Self-pay

## 2016-10-17 ENCOUNTER — Encounter (HOSPITAL_COMMUNITY)
Admission: RE | Admit: 2016-10-17 | Discharge: 2016-10-17 | Disposition: A | Discharge status: Home / Self Care | Payer: Medicare Other | Source: Ambulatory Visit | Attending: Orthopedic Surgery | Admitting: Orthopedic Surgery

## 2016-10-17 DIAGNOSIS — Z01818 Encounter for other preprocedural examination: Secondary | ICD-10-CM | POA: Insufficient documentation

## 2016-10-17 LAB — BASIC METABOLIC PANEL
ANION GAP: 9 (ref 5–15)
BUN: 13 mg/dL (ref 6–20)
CO2: 24 mmol/L (ref 22–32)
Calcium: 9.3 mg/dL (ref 8.9–10.3)
Chloride: 106 mmol/L (ref 101–111)
Creatinine, Ser: 0.93 mg/dL (ref 0.61–1.24)
GLUCOSE: 134 mg/dL — AB (ref 65–99)
Potassium: 4.2 mmol/L (ref 3.5–5.1)
SODIUM: 139 mmol/L (ref 135–145)

## 2016-10-17 LAB — CBC
HCT: 41.5 % (ref 39.0–52.0)
Hemoglobin: 13.9 g/dL (ref 13.0–17.0)
MCH: 34 pg (ref 26.0–34.0)
MCHC: 33.5 g/dL (ref 30.0–36.0)
MCV: 101.5 fL — AB (ref 78.0–100.0)
PLATELETS: 266 10*3/uL (ref 150–400)
RBC: 4.09 MIL/uL — AB (ref 4.22–5.81)
RDW: 13.1 % (ref 11.5–15.5)
WBC: 6.6 10*3/uL (ref 4.0–10.5)

## 2016-10-17 LAB — SURGICAL PCR SCREEN
MRSA, PCR: NEGATIVE
STAPHYLOCOCCUS AUREUS: NEGATIVE

## 2016-10-24 MED ORDER — CEFAZOLIN SODIUM-DEXTROSE 2-4 GM/100ML-% IV SOLN
2.0000 g | INTRAVENOUS | Status: AC
  Administered 2016-10-25: 2 g via INTRAVENOUS
  Filled 2016-10-24: qty 100

## 2016-10-25 ENCOUNTER — Encounter (HOSPITAL_COMMUNITY)
Admission: RE | Disposition: A | Discharge status: Home / Self Care | Payer: Self-pay | Source: Ambulatory Visit | Attending: Orthopedic Surgery

## 2016-10-25 ENCOUNTER — Inpatient Hospital Stay (HOSPITAL_COMMUNITY): Payer: Medicare Other

## 2016-10-25 ENCOUNTER — Encounter (HOSPITAL_COMMUNITY): Payer: Self-pay

## 2016-10-25 ENCOUNTER — Inpatient Hospital Stay (HOSPITAL_COMMUNITY): Payer: Medicare Other | Admitting: Anesthesiology

## 2016-10-25 ENCOUNTER — Inpatient Hospital Stay (HOSPITAL_COMMUNITY)
Admission: RE | Admit: 2016-10-25 | Discharge: 2016-10-28 | DRG: 483 | Disposition: A | Discharge status: Home / Self Care | Payer: Medicare Other | Source: Ambulatory Visit | Attending: Orthopedic Surgery | Admitting: Orthopedic Surgery

## 2016-10-25 DIAGNOSIS — F329 Major depressive disorder, single episode, unspecified: Secondary | ICD-10-CM | POA: Diagnosis present

## 2016-10-25 DIAGNOSIS — Z87891 Personal history of nicotine dependence: Secondary | ICD-10-CM

## 2016-10-25 DIAGNOSIS — E785 Hyperlipidemia, unspecified: Secondary | ICD-10-CM | POA: Diagnosis not present

## 2016-10-25 DIAGNOSIS — Z471 Aftercare following joint replacement surgery: Secondary | ICD-10-CM | POA: Diagnosis not present

## 2016-10-25 DIAGNOSIS — Z79899 Other long term (current) drug therapy: Secondary | ICD-10-CM | POA: Diagnosis not present

## 2016-10-25 DIAGNOSIS — E861 Hypovolemia: Secondary | ICD-10-CM | POA: Diagnosis not present

## 2016-10-25 DIAGNOSIS — M25311 Other instability, right shoulder: Secondary | ICD-10-CM | POA: Diagnosis present

## 2016-10-25 DIAGNOSIS — Z981 Arthrodesis status: Secondary | ICD-10-CM | POA: Diagnosis not present

## 2016-10-25 DIAGNOSIS — Z8546 Personal history of malignant neoplasm of prostate: Secondary | ICD-10-CM

## 2016-10-25 DIAGNOSIS — Z7982 Long term (current) use of aspirin: Secondary | ICD-10-CM | POA: Diagnosis not present

## 2016-10-25 DIAGNOSIS — Z8673 Personal history of transient ischemic attack (TIA), and cerebral infarction without residual deficits: Secondary | ICD-10-CM

## 2016-10-25 DIAGNOSIS — M75101 Unspecified rotator cuff tear or rupture of right shoulder, not specified as traumatic: Secondary | ICD-10-CM | POA: Diagnosis not present

## 2016-10-25 DIAGNOSIS — M12811 Other specific arthropathies, not elsewhere classified, right shoulder: Secondary | ICD-10-CM | POA: Diagnosis not present

## 2016-10-25 DIAGNOSIS — M19011 Primary osteoarthritis, right shoulder: Secondary | ICD-10-CM | POA: Diagnosis not present

## 2016-10-25 DIAGNOSIS — D539 Nutritional anemia, unspecified: Secondary | ICD-10-CM | POA: Diagnosis present

## 2016-10-25 DIAGNOSIS — G8918 Other acute postprocedural pain: Secondary | ICD-10-CM | POA: Diagnosis not present

## 2016-10-25 DIAGNOSIS — G9340 Encephalopathy, unspecified: Secondary | ICD-10-CM | POA: Diagnosis present

## 2016-10-25 DIAGNOSIS — K219 Gastro-esophageal reflux disease without esophagitis: Secondary | ICD-10-CM | POA: Diagnosis present

## 2016-10-25 DIAGNOSIS — M25511 Pain in right shoulder: Secondary | ICD-10-CM | POA: Diagnosis present

## 2016-10-25 DIAGNOSIS — Z96611 Presence of right artificial shoulder joint: Secondary | ICD-10-CM | POA: Diagnosis not present

## 2016-10-25 DIAGNOSIS — I10 Essential (primary) hypertension: Secondary | ICD-10-CM | POA: Diagnosis not present

## 2016-10-25 DIAGNOSIS — I959 Hypotension, unspecified: Secondary | ICD-10-CM | POA: Diagnosis present

## 2016-10-25 DIAGNOSIS — Z96619 Presence of unspecified artificial shoulder joint: Secondary | ICD-10-CM

## 2016-10-25 HISTORY — DX: Unspecified rotator cuff tear or rupture of right shoulder, not specified as traumatic: M75.101

## 2016-10-25 HISTORY — DX: Other specific arthropathies, not elsewhere classified, right shoulder: M12.811

## 2016-10-25 HISTORY — PX: TOTAL SHOULDER ARTHROPLASTY: SHX126

## 2016-10-25 LAB — GLUCOSE, CAPILLARY: Glucose-Capillary: 116 mg/dL — ABNORMAL HIGH (ref 65–99)

## 2016-10-25 SURGERY — ARTHROPLASTY, SHOULDER, TOTAL
Anesthesia: General | Site: Shoulder | Laterality: Right

## 2016-10-25 MED ORDER — ONDANSETRON HCL 4 MG PO TABS
4.0000 mg | ORAL_TABLET | Freq: Four times a day (QID) | ORAL | Status: DC | PRN
  Administered 2016-10-25: 4 mg via ORAL
  Filled 2016-10-25: qty 1

## 2016-10-25 MED ORDER — BUPIVACAINE HCL (PF) 0.25 % IJ SOLN
INTRAMUSCULAR | Status: DC | PRN
  Administered 2016-10-25: 20 mL

## 2016-10-25 MED ORDER — ROCURONIUM BROMIDE 50 MG/5ML IV SOLN
INTRAVENOUS | Status: AC
  Filled 2016-10-25: qty 2

## 2016-10-25 MED ORDER — TIZANIDINE HCL 4 MG PO TABS
4.0000 mg | ORAL_TABLET | Freq: Three times a day (TID) | ORAL | Status: DC | PRN
  Administered 2016-10-25: 4 mg via ORAL
  Filled 2016-10-25: qty 1

## 2016-10-25 MED ORDER — SUGAMMADEX SODIUM 200 MG/2ML IV SOLN
INTRAVENOUS | Status: DC | PRN
  Administered 2016-10-25: 100 mg via INTRAVENOUS
  Administered 2016-10-25: 50 mg via INTRAVENOUS

## 2016-10-25 MED ORDER — ONDANSETRON HCL 4 MG/2ML IJ SOLN
INTRAMUSCULAR | Status: DC | PRN
  Administered 2016-10-25: 4 mg via INTRAVENOUS

## 2016-10-25 MED ORDER — FENTANYL CITRATE (PF) 100 MCG/2ML IJ SOLN
100.0000 ug | Freq: Once | INTRAMUSCULAR | Status: AC
  Administered 2016-10-25: 50 ug via INTRAVENOUS

## 2016-10-25 MED ORDER — MIDAZOLAM HCL 2 MG/2ML IJ SOLN
2.0000 mg | Freq: Once | INTRAMUSCULAR | Status: AC
  Administered 2016-10-25: 2 mg via INTRAVENOUS

## 2016-10-25 MED ORDER — OXYCODONE HCL 5 MG PO TABS: 5.0000 mg | ORAL_TABLET | ORAL | 0 refills | Status: DC | PRN

## 2016-10-25 MED ORDER — IRBESARTAN 300 MG PO TABS: 300.0000 mg | ORAL_TABLET | Freq: Every day | ORAL | Status: DC

## 2016-10-25 MED ORDER — DIPHENHYDRAMINE HCL 12.5 MG/5ML PO ELIX: 12.5000 mg | ORAL_SOLUTION | ORAL | Status: DC | PRN

## 2016-10-25 MED ORDER — ONDANSETRON HCL 4 MG PO TABS: 4.0000 mg | ORAL_TABLET | Freq: Three times a day (TID) | ORAL | 0 refills | Status: DC | PRN

## 2016-10-25 MED ORDER — ZOLPIDEM TARTRATE 5 MG PO TABS: 5.0000 mg | ORAL_TABLET | Freq: Every evening | ORAL | Status: DC | PRN

## 2016-10-25 MED ORDER — COLCHICINE 0.6 MG PO TABS: 0.6000 mg | ORAL_TABLET | Freq: Two times a day (BID) | ORAL | Status: DC | PRN

## 2016-10-25 MED ORDER — METOCLOPRAMIDE HCL 5 MG PO TABS: 5.0000 mg | ORAL_TABLET | Freq: Three times a day (TID) | ORAL | Status: DC | PRN

## 2016-10-25 MED ORDER — MIDAZOLAM HCL 2 MG/2ML IJ SOLN
INTRAMUSCULAR | Status: AC
  Filled 2016-10-25: qty 2

## 2016-10-25 MED ORDER — FENTANYL CITRATE (PF) 250 MCG/5ML IJ SOLN
INTRAMUSCULAR | Status: AC
  Filled 2016-10-25: qty 5

## 2016-10-25 MED ORDER — PHENYLEPHRINE HCL 10 MG/ML IJ SOLN
INTRAMUSCULAR | Status: DC | PRN
  Administered 2016-10-25: 30 ug/min via INTRAVENOUS

## 2016-10-25 MED ORDER — LIDOCAINE HCL (CARDIAC) 20 MG/ML IV SOLN
INTRAVENOUS | Status: AC
  Filled 2016-10-25: qty 10

## 2016-10-25 MED ORDER — POLYETHYLENE GLYCOL 3350 17 G PO PACK: 17.0000 g | PACK | Freq: Every day | ORAL | Status: DC | PRN

## 2016-10-25 MED ORDER — HYDROMORPHONE HCL 1 MG/ML IJ SOLN: 0.5000 mg | INTRAMUSCULAR | Status: DC | PRN

## 2016-10-25 MED ORDER — LIDOCAINE 2% (20 MG/ML) 5 ML SYRINGE
INTRAMUSCULAR | Status: DC | PRN
  Administered 2016-10-25: 100 mg via INTRAVENOUS

## 2016-10-25 MED ORDER — MAGNESIUM CITRATE PO SOLN: 1.0000 | Freq: Once | ORAL | Status: DC | PRN

## 2016-10-25 MED ORDER — ASPIRIN 81 MG PO CHEW
81.0000 mg | CHEWABLE_TABLET | Freq: Every day | ORAL | Status: DC
  Administered 2016-10-26 – 2016-10-28 (×3): 81 mg via ORAL
  Filled 2016-10-25 (×3): qty 1

## 2016-10-25 MED ORDER — FENTANYL CITRATE (PF) 100 MCG/2ML IJ SOLN
INTRAMUSCULAR | Status: AC
Start: 2016-10-25 — End: 2016-10-25
  Filled 2016-10-25: qty 2

## 2016-10-25 MED ORDER — ROCURONIUM BROMIDE 10 MG/ML (PF) SYRINGE
PREFILLED_SYRINGE | INTRAVENOUS | Status: DC | PRN
  Administered 2016-10-25: 40 mg via INTRAVENOUS

## 2016-10-25 MED ORDER — VITAMIN C 500 MG PO TABS
1000.0000 mg | ORAL_TABLET | Freq: Every day | ORAL | Status: DC
  Administered 2016-10-26 – 2016-10-28 (×3): 1000 mg via ORAL
  Filled 2016-10-25 (×3): qty 2

## 2016-10-25 MED ORDER — EPHEDRINE SULFATE-NACL 50-0.9 MG/10ML-% IV SOSY
PREFILLED_SYRINGE | INTRAVENOUS | Status: DC | PRN
  Administered 2016-10-25 (×2): 5 mg via INTRAVENOUS

## 2016-10-25 MED ORDER — DEXAMETHASONE SODIUM PHOSPHATE 10 MG/ML IJ SOLN
INTRAMUSCULAR | Status: AC
  Filled 2016-10-25: qty 2

## 2016-10-25 MED ORDER — BISACODYL 10 MG RE SUPP: 10.0000 mg | Freq: Every day | RECTAL | Status: DC | PRN

## 2016-10-25 MED ORDER — OXYCODONE HCL 5 MG PO TABS
5.0000 mg | ORAL_TABLET | ORAL | Status: DC | PRN
  Administered 2016-10-26 (×2): 10 mg via ORAL
  Administered 2016-10-26: 5 mg via ORAL
  Administered 2016-10-26 – 2016-10-28 (×10): 10 mg via ORAL
  Filled 2016-10-25 (×7): qty 2
  Filled 2016-10-25: qty 1
  Filled 2016-10-25 (×5): qty 2

## 2016-10-25 MED ORDER — LACTATED RINGERS IV SOLN
INTRAVENOUS | Status: DC
  Administered 2016-10-25 (×2): via INTRAVENOUS

## 2016-10-25 MED ORDER — GABAPENTIN 300 MG PO CAPS
600.0000 mg | ORAL_CAPSULE | Freq: Three times a day (TID) | ORAL | Status: DC
  Administered 2016-10-26 – 2016-10-28 (×7): 600 mg via ORAL
  Filled 2016-10-25 (×7): qty 2

## 2016-10-25 MED ORDER — CYANOCOBALAMIN 1000 MCG/ML IJ KIT: PACK | INTRAMUSCULAR | Status: DC

## 2016-10-25 MED ORDER — MENTHOL 3 MG MT LOZG: 1.0000 | LOZENGE | OROMUCOSAL | Status: DC | PRN

## 2016-10-25 MED ORDER — VITAMIN D 1000 UNITS PO TABS
2000.0000 [IU] | ORAL_TABLET | Freq: Every day | ORAL | Status: DC
  Administered 2016-10-26 – 2016-10-28 (×3): 2000 [IU] via ORAL
  Filled 2016-10-25 (×3): qty 2

## 2016-10-25 MED ORDER — ACETAMINOPHEN 325 MG PO TABS
650.0000 mg | ORAL_TABLET | Freq: Four times a day (QID) | ORAL | Status: DC | PRN
  Administered 2016-10-27: 650 mg via ORAL
  Filled 2016-10-25: qty 2

## 2016-10-25 MED ORDER — SUGAMMADEX SODIUM 200 MG/2ML IV SOLN
INTRAVENOUS | Status: AC
  Filled 2016-10-25: qty 4

## 2016-10-25 MED ORDER — DOCUSATE SODIUM 100 MG PO CAPS
100.0000 mg | ORAL_CAPSULE | Freq: Two times a day (BID) | ORAL | Status: DC
  Administered 2016-10-26 – 2016-10-28 (×5): 100 mg via ORAL
  Filled 2016-10-25 (×5): qty 1

## 2016-10-25 MED ORDER — ONDANSETRON HCL 4 MG/2ML IJ SOLN
INTRAMUSCULAR | Status: AC
  Filled 2016-10-25: qty 4

## 2016-10-25 MED ORDER — PHENYLEPHRINE 40 MCG/ML (10ML) SYRINGE FOR IV PUSH (FOR BLOOD PRESSURE SUPPORT)
PREFILLED_SYRINGE | INTRAVENOUS | Status: AC
  Filled 2016-10-25: qty 20

## 2016-10-25 MED ORDER — AMLODIPINE BESYLATE 5 MG PO TABS: 5.0000 mg | ORAL_TABLET | Freq: Every day | ORAL | Status: DC

## 2016-10-25 MED ORDER — ALLOPURINOL 300 MG PO TABS
300.0000 mg | ORAL_TABLET | Freq: Every day | ORAL | Status: DC
  Administered 2016-10-26 – 2016-10-28 (×3): 300 mg via ORAL
  Filled 2016-10-25 (×3): qty 1

## 2016-10-25 MED ORDER — SENNA 8.6 MG PO TABS
1.0000 | ORAL_TABLET | Freq: Two times a day (BID) | ORAL | Status: DC
  Administered 2016-10-26 – 2016-10-28 (×5): 8.6 mg via ORAL
  Filled 2016-10-25 (×6): qty 1

## 2016-10-25 MED ORDER — PHENOL 1.4 % MT LIQD: 1.0000 | OROMUCOSAL | Status: DC | PRN

## 2016-10-25 MED ORDER — POTASSIUM CHLORIDE IN NACL 20-0.45 MEQ/L-% IV SOLN
INTRAVENOUS | Status: DC
  Administered 2016-10-25: 18:00:00 via INTRAVENOUS
  Filled 2016-10-25: qty 1000

## 2016-10-25 MED ORDER — EPHEDRINE 5 MG/ML INJ
INTRAVENOUS | Status: AC
  Filled 2016-10-25: qty 20

## 2016-10-25 MED ORDER — PROPOFOL 10 MG/ML IV BOLUS
INTRAVENOUS | Status: AC
  Filled 2016-10-25: qty 20

## 2016-10-25 MED ORDER — PHENYLEPHRINE 40 MCG/ML (10ML) SYRINGE FOR IV PUSH (FOR BLOOD PRESSURE SUPPORT)
PREFILLED_SYRINGE | INTRAVENOUS | Status: DC | PRN
  Administered 2016-10-25 (×3): 40 ug via INTRAVENOUS

## 2016-10-25 MED ORDER — BUPIVACAINE-EPINEPHRINE (PF) 0.5% -1:200000 IJ SOLN
INTRAMUSCULAR | Status: DC | PRN
  Administered 2016-10-25: 25 mL via PERINEURAL

## 2016-10-25 MED ORDER — PROPOFOL 10 MG/ML IV BOLUS
INTRAVENOUS | Status: DC | PRN
  Administered 2016-10-25: 130 mg via INTRAVENOUS

## 2016-10-25 MED ORDER — ONDANSETRON HCL 4 MG/2ML IJ SOLN: 4.0000 mg | Freq: Four times a day (QID) | INTRAMUSCULAR | Status: DC | PRN

## 2016-10-25 MED ORDER — LACTATED RINGERS IV BOLUS (SEPSIS)
500.0000 mL | Freq: Once | INTRAVENOUS | Status: AC
  Administered 2016-10-25: 500 mL via INTRAVENOUS

## 2016-10-25 MED ORDER — CEFAZOLIN SODIUM-DEXTROSE 1-4 GM/50ML-% IV SOLN
1.0000 g | Freq: Four times a day (QID) | INTRAVENOUS | Status: AC
  Administered 2016-10-25 – 2016-10-26 (×3): 1 g via INTRAVENOUS
  Filled 2016-10-25 (×3): qty 50

## 2016-10-25 MED ORDER — ATORVASTATIN CALCIUM 40 MG PO TABS
40.0000 mg | ORAL_TABLET | Freq: Every day | ORAL | Status: DC
  Administered 2016-10-26 – 2016-10-28 (×3): 40 mg via ORAL
  Filled 2016-10-25 (×3): qty 1

## 2016-10-25 MED ORDER — 0.9 % SODIUM CHLORIDE (POUR BTL) OPTIME
TOPICAL | Status: DC | PRN
  Administered 2016-10-25: 1000 mL

## 2016-10-25 MED ORDER — SENNA-DOCUSATE SODIUM 8.6-50 MG PO TABS: 2.0000 | ORAL_TABLET | Freq: Every day | ORAL | 1 refills | Status: DC

## 2016-10-25 MED ORDER — METOCLOPRAMIDE HCL 5 MG/ML IJ SOLN: 5.0000 mg | Freq: Three times a day (TID) | INTRAMUSCULAR | Status: DC | PRN

## 2016-10-25 MED ORDER — ACETAMINOPHEN 650 MG RE SUPP: 650.0000 mg | Freq: Four times a day (QID) | RECTAL | Status: DC | PRN

## 2016-10-25 MED ORDER — ALUM & MAG HYDROXIDE-SIMETH 200-200-20 MG/5ML PO SUSP
30.0000 mL | ORAL | Status: DC | PRN
  Filled 2016-10-25: qty 30

## 2016-10-25 SURGICAL SUPPLY — 51 items
BIT DRILL TWIST 2.7 (BIT) ×1 IMPLANT
BIT DRILL TWIST 2.7MM (BIT) ×1
BLADE SAW SGTL MED 73X18.5 STR (BLADE) ×3 IMPLANT
CAPT SHLDR REVTOTAL 2 ×2 IMPLANT
CLOSURE STERI-STRIP 1/2X4 (GAUZE/BANDAGES/DRESSINGS) ×1
CLSR STERI-STRIP ANTIMIC 1/2X4 (GAUZE/BANDAGES/DRESSINGS) ×2 IMPLANT
COVER SURGICAL LIGHT HANDLE (MISCELLANEOUS) ×3 IMPLANT
DRAPE HALF SHEET 40X57 (DRAPES) ×3 IMPLANT
DRAPE ORTHO SPLIT 77X108 STRL (DRAPES) ×6
DRAPE SURG ORHT 6 SPLT 77X108 (DRAPES) ×2 IMPLANT
DRAPE U-SHAPE 47X51 STRL (DRAPES) ×3 IMPLANT
DRSG MEPILEX BORDER 4X8 (GAUZE/BANDAGES/DRESSINGS) ×3 IMPLANT
DURAPREP 26ML APPLICATOR (WOUND CARE) ×3 IMPLANT
ELECT REM PT RETURN 9FT ADLT (ELECTROSURGICAL) ×3
ELECTRODE REM PT RTRN 9FT ADLT (ELECTROSURGICAL) ×1 IMPLANT
GLOVE BIOGEL PI ORTHO PRO SZ8 (GLOVE) ×4
GLOVE ORTHO TXT STRL SZ7.5 (GLOVE) ×3 IMPLANT
GLOVE PI ORTHO PRO STRL SZ8 (GLOVE) ×2 IMPLANT
GLOVE SURG ORTHO 8.0 STRL STRW (GLOVE) ×3 IMPLANT
GOWN STRL REUS W/ TWL XL LVL3 (GOWN DISPOSABLE) ×1 IMPLANT
GOWN STRL REUS W/TWL 2XL LVL3 (GOWN DISPOSABLE) ×3 IMPLANT
GOWN STRL REUS W/TWL XL LVL3 (GOWN DISPOSABLE) ×3
HANDPIECE INTERPULSE COAX TIP (DISPOSABLE) ×3
HOOD PEEL AWAY FACE SHEILD DIS (HOOD) ×6 IMPLANT
KIT BASIN OR (CUSTOM PROCEDURE TRAY) ×3 IMPLANT
KIT ROOM TURNOVER OR (KITS) ×3 IMPLANT
MANIFOLD NEPTUNE II (INSTRUMENTS) ×3 IMPLANT
NS IRRIG 1000ML POUR BTL (IV SOLUTION) ×3 IMPLANT
PACK SHOULDER (CUSTOM PROCEDURE TRAY) ×3 IMPLANT
PAD ARMBOARD 7.5X6 YLW CONV (MISCELLANEOUS) ×6 IMPLANT
PIN THREADED REVERSE (PIN) ×2 IMPLANT
SET HNDPC FAN SPRY TIP SCT (DISPOSABLE) ×1 IMPLANT
SLING ARM IMMOBILIZER LRG (SOFTGOODS) IMPLANT
SLING ARM IMMOBILIZER MED (SOFTGOODS) ×2 IMPLANT
SMARTMIX MINI TOWER (MISCELLANEOUS)
SUCTION FRAZIER HANDLE 10FR (MISCELLANEOUS) ×2
SUCTION TUBE FRAZIER 10FR DISP (MISCELLANEOUS) ×1 IMPLANT
SUPPORT WRAP ARM LG (MISCELLANEOUS) ×3 IMPLANT
SUT FIBERWIRE #2 38 REV NDL BL (SUTURE) ×18
SUT VIC AB 0 CT1 27 (SUTURE) ×3
SUT VIC AB 0 CT1 27XBRD ANBCTR (SUTURE) ×1 IMPLANT
SUT VIC AB 2-0 CT1 27 (SUTURE) ×3
SUT VIC AB 2-0 CT1 TAPERPNT 27 (SUTURE) ×1 IMPLANT
SUT VIC AB 3-0 SH 8-18 (SUTURE) ×3 IMPLANT
SUTURE FIBERWR#2 38 REV NDL BL (SUTURE) ×6 IMPLANT
TOWEL OR 17X24 6PK STRL BLUE (TOWEL DISPOSABLE) ×3 IMPLANT
TOWEL OR 17X26 10 PK STRL BLUE (TOWEL DISPOSABLE) ×3 IMPLANT
TOWER SMARTMIX MINI (MISCELLANEOUS) IMPLANT
TUBE CONNECTING 12'X1/4 (SUCTIONS)
TUBE CONNECTING 12X1/4 (SUCTIONS) IMPLANT
YANKAUER SUCT BULB TIP NO VENT (SUCTIONS) ×3 IMPLANT

## 2016-10-25 NOTE — Anesthesia Procedure Notes (Signed)
Procedure Name: Intubation Date/Time: 10/25/2016 11:14 AM Performed by: Merdis Delay Pre-anesthesia Checklist: Patient identified, Emergency Drugs available, Suction available, Patient being monitored and Timeout performed Patient Re-evaluated:Patient Re-evaluated prior to induction Oxygen Delivery Method: Circle system utilized Preoxygenation: Pre-oxygenation with 100% oxygen Induction Type: IV induction Ventilation: Mask ventilation without difficulty and Oral airway inserted - appropriate to patient size Laryngoscope Size: Mac and 4 Grade View: Grade II Tube type: Oral Tube size: 7.5 mm Number of attempts: 1 Placement Confirmation: ETT inserted through vocal cords under direct vision,  positive ETCO2 and breath sounds checked- equal and bilateral Secured at: 22 cm Tube secured with: Tape Dental Injury: Teeth and Oropharynx as per pre-operative assessment

## 2016-10-25 NOTE — Progress Notes (Signed)
Alert and oriented male admitted dto unit with sling to right arm. Pt expressed displeasure with room view.  No distress noted.

## 2016-10-25 NOTE — Anesthesia Preprocedure Evaluation (Addendum)
Anesthesia Evaluation  Patient identified by MRN, date of birth, ID band Patient awake    Reviewed: Allergy & Precautions, H&P , Patient's Chart, lab work & pertinent test results, reviewed documented beta blocker date and time   Airway Mallampati: II  TM Distance: >3 FB Neck ROM: full    Dental no notable dental hx. (+)    Pulmonary former smoker,    Pulmonary exam normal breath sounds clear to auscultation       Cardiovascular hypertension,  Rhythm:regular Rate:Normal     Neuro/Psych    GI/Hepatic   Endo/Other    Renal/GU      Musculoskeletal   Abdominal   Peds  Hematology   Anesthesia Other Findings HTN Hx of CVA   Echo 06/28/11:  - Left ventricle: The cavity size was normal. Wall thickness was increased in a pattern of mild LVH. Systolic function was normal. The estimated ejection fraction was in the range of 55% to 65%. Wall motion was normal; there were noregional wall motion abnormalities. Doppler parameters areconsistent with abnormal left ventricular relaxation (grade 1 diastolic dysfunction).  Reproductive/Obstetrics                            Anesthesia Physical Anesthesia Plan  ASA: II  Anesthesia Plan: General   Post-op Pain Management:  Regional for Post-op pain   Induction: Intravenous  PONV Risk Score and Plan:   Airway Management Planned: Oral ETT  Additional Equipment:   Intra-op Plan:   Post-operative Plan: Extubation in OR  Informed Consent: I have reviewed the patients History and Physical, chart, labs and discussed the procedure including the risks, benefits and alternatives for the proposed anesthesia with the patient or authorized representative who has indicated his/her understanding and acceptance.   Dental Advisory Given  Plan Discussed with: CRNA and Surgeon  Anesthesia Plan Comments: (  )        Anesthesia Quick Evaluation

## 2016-10-25 NOTE — Progress Notes (Signed)
Orthopedic Tech Progress Note Patient Details:  Nathan Wallace 04-Dec-1950 [id number] Patient has sling  Patient ID: Nathan Wallace, male   DOB: 04-04-1951, 66 y.o.   MRN: [id number]   Nathan Wallace 10/25/2016, 3:05 PM

## 2016-10-25 NOTE — Discharge Instructions (Signed)

## 2016-10-25 NOTE — H&P (Signed)
PREOPERATIVE H&P  Chief Complaint: Right shoulder rotator cuff arthropathy with dislocation  HPI: Nathan Wallace is a 66 y.o. male who presents for preoperative history and physical with a diagnosis of right shoulder rotator cuff arthropathy with dislocation.  Symptoms are rated as moderate to severe, and have been worsening.  This is significantly impairing activities of daily living.  He has elected for surgical management.   He has failed injections, activity modification, anti-inflammatories, and assistive devices.  Preoperative X-rays demonstrate end stage degenerative changes with osteophyte formation, loss of joint space, subchondral sclerosis.   Past Medical History:  Diagnosis Date  . AKI (acute kidney injury) ([place])   . Arthritis    RT KNEE AND RT SHOULDER  . B12 deficiency   . Benign head tremor   . Chest tightness    comes and goes   . CVA (cerebrovascular accident due to intracerebral hemorrhage) ([place]) 08/03/2011  . Degenerative joint disease of knee, right   . Depression 07/26/2012   no problems now  . GERD (gastroesophageal reflux disease)    NO RECENT PROBLEMS AND NO MEDS  . History of blood transfusion   . History of kidney stones   . History of urinary tract infection   . Hyperlipidemia    border line  . Hypertension   . Impaired glucose tolerance 06/22/2011  . Left inguinal hernia   . MGUS (monoclonal gammopathy of unknown significance)   . Prostate cancer ([place]) 2011  . Sepsis ([place])   . Stress incontinence   . Stroke [facility])    2013  . Syncope 2012   after prostate surgery; neg neurology work up.    Past Surgical History:  Procedure Laterality Date  . COLONOSCOPY    . INGUINAL HERNIA REPAIR  06/01/2011   Procedure: HERNIA REPAIR INGUINAL ADULT;  Surgeon: Adin Hector, MD;  Location: [facility];  Service: General;  Laterality: Left;  Left Inguinal Hernia Repair with Mesh  . ROBOT ASSISTED LAPAROSCOPIC RADICAL PROSTATECTOMY  jan 2012   da vinci  . SPINE  SURGERY  2003 OR 2004   CERVICAL FUSION-STATES LIMITED ROM NECK  . URETHRAL SLING N/A 03/08/2016   Procedure: MALE SLING CYSTOSCOPY;  Surgeon: Bjorn Loser, MD;  Location: [facility];  Service: Urology;  Laterality: N/A;  . WRIST SURGERY     right    Social History   Social History  . Marital status: Married    Spouse name: N/A  . Number of children: 3  . Years of education: N/A   Occupational History  .  Retired    retired Art gallery manager.    Social History Main Topics  . Smoking status: Former Smoker    Packs/day: 0.50    Years: 1.00    Types: Cigarettes    Quit date: 06/28/1966  . Smokeless tobacco: Never Used  . Alcohol use Yes     Comment: 3 beers daily; liquor 2 glasses every other day   . Drug use: Yes    Types: Marijuana     Comment: last time 1 week ago at a party  . Sexual activity: Not Asked   Other Topics Concern  . None   Social History Narrative  . None   Family History  Problem Relation Age of Onset  . Hypertension Mother   . Aneurysm Mother        brain  . Cancer Father        colon  . Colon cancer Father   . Heart attack Brother  Allergies  Allergen Reactions  . No Known Allergies    Prior to Admission medications   Medication Sig Start Date End Date Taking? Authorizing Provider  allopurinol (ZYLOPRIM) 300 MG tablet TAKE 1 TABLET BY MOUTH  DAILY 07/07/16  Yes Hulan Saas M, DO  amLODipine (NORVASC) 5 MG tablet TAKE 1 TABLET BY MOUTH  DAILY 10/17/16  Yes Biagio Borg, MD  Ascorbic Acid (VITAMIN C WITH ROSE HIPS) 1000 MG tablet Take 1,000 mg by mouth daily.   Yes [provider]  aspirin 81 MG chewable tablet Chew 1 tablet (81 mg total) by mouth daily. 03/14/16  Yes MacDiarmid, Nicki Reaper, MD  atorvastatin (LIPITOR) 40 MG tablet TAKE 1 TABLET BY MOUTH  DAILY 10/17/16  Yes Biagio Borg, MD  Cholecalciferol (VITAMIN D-3) 1000 units CAPS Take 2,000 Units by mouth daily.    Yes [provider]  colchicine 0.6 MG tablet Take 1  tablet (0.6 mg total) by mouth 2 (two) times daily. For 5 days when having flare. 11/03/15  Yes Lyndal Pulley, DO  Cyanocobalamin (B-12 COMPLIANCE INJECTION IJ) Inject 1 Dose as directed every 30 (thirty) days.   Yes [provider]  gabapentin (NEURONTIN) 300 MG capsule TAKE 2 CAPSULES BY MOUTH 3  TIMES DAILY 08/29/16  Yes Hulan Saas M, DO  HYDROcodone-acetaminophen (NORCO/VICODIN) 5-325 MG tablet Take 1-2 tablets by mouth every 4 (four) hours as needed for moderate pain. 03/09/16  Yes MacDiarmid, Nicki Reaper, MD  irbesartan (AVAPRO) 300 MG tablet TAKE 1 TABLET BY MOUTH AT  BEDTIME 10/17/16  Yes Biagio Borg, MD  PRESCRIPTION MEDICATION 1 Syringe by Intracavernosal route every other day as needed (10-30 minutes before you have sex for ED). Matrix (Papaverine 30mg  Phentolamine 1mg  Alprostadil 31mcg per ml injection)  erectile dysfunction injection every other day as needed for sex   Yes [provider]  tiZANidine (ZANAFLEX) 4 MG tablet TAKE 1 TABLET BY MOUTH 3  TIMES DAILY AS NEEDED FOR  MUSCLE SPASM(S) 08/29/16  Yes Biagio Borg, MD     Positive ROS: All other systems have been reviewed and were otherwise negative with the exception of those mentioned in the HPI and as above.  Physical Exam: General: Alert, no acute distress Cardiovascular: No pedal edema Respiratory: No cyanosis, no use of accessory musculature GI: No organomegaly, abdomen is soft and non-tender Skin: No lesions in the area of chief complaint Neurologic: Sensation intact distally Psychiatric: Patient is competent for consent with normal mood and affect Lymphatic: No axillary or cervical lymphadenopathy  MUSCULOSKELETAL: Right shoulder has active motion 0-30 with weakness. Sensation grossly intact, although in the office there was concern about some mild paresthesias in the axillary nerve distribution. His deltoid does seem to fire.  Assessment: Right shoulder rotator cuff arthropathy with  dislocation  Plan: Plan for Procedure(s): RIGHT TOTAL SHOULDER ARTHROPLASTY, reverse  The risks benefits and alternatives were discussed with the patient including but not limited to the risks of nonoperative treatment, versus surgical intervention including infection, bleeding, nerve injury,  blood clots, cardiopulmonary complications, morbidity, mortality, among others, and they were willing to proceed.   Johnny Bridge, MD Cell [phone]   10/25/2016 9:51 AM

## 2016-10-25 NOTE — Anesthesia Procedure Notes (Signed)
Anesthesia Regional Block: Interscalene brachial plexus block   Pre-Anesthetic Checklist: ,, timeout performed, Correct Patient, Correct Site, Correct Laterality, Correct Procedure, Correct Position, site marked, Risks and benefits discussed, pre-op evaluation,  At surgeon's request and post-op pain management  Laterality: Right  Prep: chloraprep       Needles:   Needle Type: Echogenic Needle     Needle Length: 9cm  Needle Gauge: 21     Additional Needles:   Procedures: ultrasound guided,,,,,,,,  Narrative:  Start time: 10/25/2016 9:11 AM End time: 10/25/2016 9:18 AM Injection made incrementally with aspirations every 5 mL. Anesthesiologist: Lyndle Herrlich

## 2016-10-25 NOTE — Progress Notes (Signed)
Called by NT @ 2145 and notified that patient's BP was low 70s/50s. Assessed pt and found him to be groggy but easy to arouse and A/Ox4. Pt states that he feels fine just drowsy after surgery. Pt drifts back to sleep right away after conversing. Checked pt's BP again and @ 2154 BP 78/84 HR 61. On call notified at 2205.  Discussed pt and given orders for 529ml LR bolus, increase MIVF to 185ml/hr, and check BP before and after bolus.  VS @ 2217 BP 78/49 HR 54. LR bolus hung at 2219. VS @ 2233 BP 74/44 HR 57. Pt cont to be very drowsy but arousable to loud voice. Called RRT RN at 2246 to discuss pt and recommendations. Bolus completed at 2303 and VS BP 80/47 HR 55. Called RRT RN @ 2303 to discuss.  Plan to cont to monitor at this time.  VS @ 2318 BP 77/56 HR 54.  VS @ 2326 BP 78/53 HR 56.  Pt cont to be very drowsy.  Responds to loud voice only.  Called RRT RN to review latest BPs.  Agreed that On Call should consult with CCMD.  RRT RN states that she plans to come up shortly to see pt.  Called On Call MD and reviewed BPs and recommendation to consult for possible ICU transfer. Explained to pt that the CN, RRT RN, On Call, and CCMD are all discussing possibly transferring him to a higher level of care due to his low BP.  Pt states he understands and is appreciative of care.  Will cont to monitor pt status.

## 2016-10-25 NOTE — Op Note (Signed)
10/25/2016  12:37 PM  PATIENT:  Nathan Wallace    PRE-OPERATIVE DIAGNOSIS:  Right shoulder rotator cuff arthropathy with glenohumeral instability and irreparable massive cuff tear  POST-OPERATIVE DIAGNOSIS:  Same  PROCEDURE:  Right reverse total shoulder replacement  SURGEON:  Johnny Bridge, MD  PHYSICIAN ASSISTANT: Joya Gaskins, OPA-C, present and scrubbed throughout the case, critical for completion in a timely fashion, and for retraction, instrumentation, and closure.  ANESTHESIA:   General with a regional block  ESTIMATED BLOOD LOSS: 150 mL  PREOPERATIVE INDICATIONS:  Nathan Wallace is a  66 y.o. male had a massive irreparable rotator cuff tear and recurrent instability of the glenohumeral joint was a large Hill-Sachs lesion who failed conservative measures and elected for surgical management.    The risks benefits and alternatives were discussed with the patient preoperatively including but not limited to the risks of infection, bleeding, nerve injury, cardiopulmonary complications, the need for revision surgery, dislocation, brachial plexus palsy, incomplete relief of pain, among others, and the patient was willing to proceed.  OPERATIVE IMPLANTS: Biomet size 16 humeral stem press-fit micro with a 44 mm reverse shoulder arthroplasty tray with a standard liner and a 36 mm glenosphere with a mini baseplate and 4 locking screws and one central nonlocking screw.  OPERATIVE FINDINGS: Massive irreparable rotator cuff tear, subscapularis was still present and amenable to repair. There was a large Hill-Sachs lesion. The glenohumeral articular cartilage was still intact on the glenoid side, although the glenoid face was declined to some degree such that I had to take off a fair amount of inferior bone in order to achieve inferior declination.  The tissue was fairly hypermobile to begin with, my head cut was not particularly deep, and I used a standard liner.  OPERATIVE PROCEDURE: The  patient was brought to the operating room and placed in the supine position. General anesthesia was administered. IV antibiotics were given. Time out was performed. The upper extremity was prepped and draped in usual sterile fashion. The patient was in a beachchair position. Deltopectoral approach was carried out. The biceps was tenodesed to the pectoralis tendon with #2 Fiberwire. The subscapularis was released off of the bone.   I then performed circumferential releases of the humerus, and then dislocated the head, and then reamed with the reamer to the above named size.  I then applied the jig, and cut the humeral head in 30 of retroversion, and then turned my attention to the glenoid.  Deep retractors were placed, and I resected the labrum, and then placed a guidepin into the center position on the glenoid, with slight inferior inclination. I then reamed over the guidepin, and this created a small metaphyseal cancellus blush inferiorly, removing just the cartilage to the subchondral bone superiorly. The base plate was selected and impacted place, and then I secured it centrally with a nonlocking screw, and I had excellent purchase both inferiorly and superiorly. I placed a short locking screws on anterior and posterior aspects.  I then turned my attention to the glenosphere, and impacted this into place, placing slight inferior offset (set on B).   The glenoid sphere was completely seated, and had engagement of the [facility] taper. I then turned my attention back to the humerus.  I sequentially broached, and then trialed, and was found to restore soft tissue tension, and it had 2 finger tightness. Therefore the above named components were selected. The shoulder felt stable throughout functional motion.  Before I placed the real prosthesis I had also placed  a total of 3 #2 FiberWire through drill holes in the humerus for later subscapularis repair.  I then impacted the real prosthesis into place, as  well as the real humeral tray, and reduced the shoulder. The shoulder had excellent motion, and was stable, and I irrigated the wounds copiously.    I then used these to repair the subscapularis. This came down to bone.  I then irrigated the shoulder copiously once more, repaired the deltopectoral interval with Vicryl followed by subcutaneous Vicryl with Steri-Strips and sterile gauze for the skin. The patient was awakened and returned back in stable and satisfactory condition. There no complications and He tolerated the procedure well.

## 2016-10-25 NOTE — Transfer of Care (Signed)
Immediate Anesthesia Transfer of Care Note  Patient: Nathan Wallace  Procedure(s) Performed: Procedure(s): RIGHT TOTAL SHOULDER ARTHROPLASTY (Right)  Patient Location: PACU  Anesthesia Type:General and Regional  Level of Consciousness: awake, alert  and oriented  Airway & Oxygen Therapy: Patient Spontanous Breathing  Post-op Assessment: Report given to RN and Post -op Vital signs reviewed and stable  Post vital signs: Reviewed and stable  Last Vitals:  Vitals:   10/25/16 0824  BP: 140/84  Pulse: (!) 56  Resp: 18  Temp: (!) 36.4 C    Last Pain:  Vitals:   10/25/16 0838  TempSrc:   PainSc: 0-No pain      Patients Stated Pain Goal: 3 (49/61/16 4353)  Complications: No apparent anesthesia complications

## 2016-10-26 ENCOUNTER — Encounter (HOSPITAL_COMMUNITY): Payer: Self-pay | Admitting: Family Medicine

## 2016-10-26 DIAGNOSIS — Z96611 Presence of right artificial shoulder joint: Secondary | ICD-10-CM

## 2016-10-26 DIAGNOSIS — Z96619 Presence of unspecified artificial shoulder joint: Secondary | ICD-10-CM

## 2016-10-26 DIAGNOSIS — M12811 Other specific arthropathies, not elsewhere classified, right shoulder: Secondary | ICD-10-CM

## 2016-10-26 DIAGNOSIS — I959 Hypotension, unspecified: Secondary | ICD-10-CM

## 2016-10-26 DIAGNOSIS — I10 Essential (primary) hypertension: Secondary | ICD-10-CM

## 2016-10-26 DIAGNOSIS — G9340 Encephalopathy, unspecified: Secondary | ICD-10-CM

## 2016-10-26 LAB — BASIC METABOLIC PANEL
ANION GAP: 7 (ref 5–15)
Anion gap: 5 (ref 5–15)
BUN: 11 mg/dL (ref 6–20)
BUN: 10 mg/dL (ref 6–20)
CALCIUM: 8.1 mg/dL — AB (ref 8.9–10.3)
CALCIUM: 8.5 mg/dL — AB (ref 8.9–10.3)
CO2: 21 mmol/L — AB (ref 22–32)
CO2: 27 mmol/L (ref 22–32)
CREATININE: 1.06 mg/dL (ref 0.61–1.24)
CREATININE: 1.17 mg/dL (ref 0.61–1.24)
Chloride: 105 mmol/L (ref 101–111)
Chloride: 109 mmol/L (ref 101–111)
GFR calc Af Amer: >60 mL/min (ref >60)
GLUCOSE: 109 mg/dL — AB (ref 65–99)
GLUCOSE: 121 mg/dL — AB (ref 65–99)
Potassium: 3.9 mmol/L (ref 3.5–5.1)
Potassium: 3.8 mmol/L (ref 3.5–5.1)
Sodium: 137 mmol/L (ref 135–145)
Sodium: 137 mmol/L (ref 135–145)

## 2016-10-26 LAB — CBC WITH DIFFERENTIAL/PLATELET
BASOS ABS: 0 10*3/uL (ref 0.0–0.1)
BASOS PCT: 0 %
EOS PCT: 1 %
Eosinophils Absolute: 0.1 10*3/uL (ref 0.0–0.7)
HEMATOCRIT: 31.8 % — AB (ref 39.0–52.0)
Hemoglobin: 10.8 g/dL — ABNORMAL LOW (ref 13.0–17.0)
Lymphocytes Relative: 24 %
Lymphs Abs: 2.1 10*3/uL (ref 0.7–4.0)
MCH: 34.2 pg — ABNORMAL HIGH (ref 26.0–34.0)
MCHC: 34 g/dL (ref 30.0–36.0)
MCV: 100.6 fL — ABNORMAL HIGH (ref 78.0–100.0)
MONO ABS: 0.6 10*3/uL (ref 0.1–1.0)
MONOS PCT: 6 %
NEUTROS ABS: 6.1 10*3/uL (ref 1.7–7.7)
Neutrophils Relative %: 69 %
PLATELETS: 204 10*3/uL (ref 150–400)
RBC: 3.16 MIL/uL — ABNORMAL LOW (ref 4.22–5.81)
RDW: 13.3 % (ref 11.5–15.5)
WBC: 8.8 10*3/uL (ref 4.0–10.5)

## 2016-10-26 LAB — CBC
HEMATOCRIT: 30.1 % — AB (ref 39.0–52.0)
Hemoglobin: 10.1 g/dL — ABNORMAL LOW (ref 13.0–17.0)
MCH: 33.9 pg (ref 26.0–34.0)
MCHC: 33.6 g/dL (ref 30.0–36.0)
MCV: 101 fL — AB (ref 78.0–100.0)
PLATELETS: 195 10*3/uL (ref 150–400)
RBC: 2.98 MIL/uL — ABNORMAL LOW (ref 4.22–5.81)
RDW: 13.2 % (ref 11.5–15.5)
WBC: 8.8 10*3/uL (ref 4.0–10.5)

## 2016-10-26 LAB — LACTIC ACID, PLASMA
Lactic Acid, Venous: 2.4 mmol/L (ref 0.5–1.9)
Lactic Acid, Venous: 1.9 mmol/L (ref 0.5–1.9)

## 2016-10-26 LAB — FOLATE: Folate: 6.5 ng/mL (ref >5.9)

## 2016-10-26 LAB — VITAMIN B12: Vitamin B-12: 283 pg/mL (ref 180–914)

## 2016-10-26 LAB — TROPONIN I

## 2016-10-26 LAB — CORTISOL: CORTISOL PLASMA: 18.6 ug/dL

## 2016-10-26 MED ORDER — FOLIC ACID 1 MG PO TABS
1.0000 mg | ORAL_TABLET | Freq: Every day | ORAL | Status: DC
  Administered 2016-10-26 – 2016-10-28 (×3): 1 mg via ORAL
  Filled 2016-10-26 (×3): qty 1

## 2016-10-26 MED ORDER — SODIUM CHLORIDE 0.9 % IV BOLUS (SEPSIS)
500.0000 mL | Freq: Once | INTRAVENOUS | Status: AC
  Administered 2016-10-26: 500 mL via INTRAVENOUS

## 2016-10-26 MED ORDER — SODIUM CHLORIDE 0.9 % IV SOLN
INTRAVENOUS | Status: AC
  Administered 2016-10-26: 01:00:00 via INTRAVENOUS

## 2016-10-26 MED ORDER — THIAMINE HCL 100 MG/ML IJ SOLN: 100.0000 mg | Freq: Every day | INTRAMUSCULAR | Status: DC

## 2016-10-26 MED ORDER — OXYCODONE-ACETAMINOPHEN 5-325 MG PO TABS
1.0000 | ORAL_TABLET | Freq: Once | ORAL | Status: AC
  Administered 2016-10-26: 1 via ORAL
  Filled 2016-10-26: qty 1

## 2016-10-26 MED ORDER — VITAMIN B-1 100 MG PO TABS
100.0000 mg | ORAL_TABLET | Freq: Every day | ORAL | Status: DC
  Administered 2016-10-26 – 2016-10-28 (×3): 100 mg via ORAL
  Filled 2016-10-26 (×3): qty 1

## 2016-10-26 MED ORDER — LORAZEPAM 2 MG/ML IJ SOLN: 1.0000 mg | Freq: Four times a day (QID) | INTRAMUSCULAR | Status: DC | PRN

## 2016-10-26 MED ORDER — LORAZEPAM 1 MG PO TABS: 1.0000 mg | ORAL_TABLET | Freq: Four times a day (QID) | ORAL | Status: DC | PRN

## 2016-10-26 MED ORDER — KETOROLAC TROMETHAMINE 30 MG/ML IJ SOLN
30.0000 mg | Freq: Once | INTRAMUSCULAR | Status: AC
  Administered 2016-10-26: 30 mg via INTRAVENOUS
  Filled 2016-10-26: qty 1

## 2016-10-26 MED ORDER — ADULT MULTIVITAMIN W/MINERALS CH
1.0000 | ORAL_TABLET | Freq: Every day | ORAL | Status: DC
  Administered 2016-10-26 – 2016-10-28 (×3): 1 via ORAL
  Filled 2016-10-26 (×3): qty 1

## 2016-10-26 MED ORDER — SODIUM CHLORIDE 0.9 % IV BOLUS (SEPSIS)
1000.0000 mL | Freq: Once | INTRAVENOUS | Status: DC
  Administered 2016-10-26: 1000 mL via INTRAVENOUS

## 2016-10-26 MED ORDER — SODIUM CHLORIDE 0.9 % IV BOLUS (SEPSIS)
1000.0000 mL | Freq: Once | INTRAVENOUS | Status: AC
  Administered 2016-10-26: 1000 mL via INTRAVENOUS

## 2016-10-26 NOTE — Evaluation (Signed)
Physical Therapy Evaluation Patient Details Name: Nathan Wallace MRN: [id number] DOB: [birth year] Today's Date: 10/26/2016   History of Present Illness  66 y.o. male who presents for preoperative history and physical with a diagnosis of right shoulder rotator cuff arthropathy with dislocation. Underwent R TSA. See chart for extensive PMH. Rapid response called 7/17 due to low BP - pt transferred to stepdown unit for fluid resusitation and monitoring.   Clinical Impression  Pt admitted with above diagnosis. Pt currently with functional limitations due to the deficits listed below (see PT Problem List). Pt ambulated 100' with slow, guarded gait. BP stable with changes in position.  Will follow acutely to progress mobility but do not anticipate him having further PT needs until OP setting.  Pt will benefit from skilled PT to increase their independence and safety with mobility to allow discharge to the venue listed below.       Follow Up Recommendations Outpatient PT (when appropriate)    Equipment Recommendations  None recommended by PT    Recommendations for Other Services       Precautions / Restrictions Precautions Precautions: Shoulder Type of Shoulder Precautions: conservative - no ROM shoulder. ROM elbow/wrist/hand  Precaution Booklet Issued: No Precaution Comments: reviewed proper positioning of sling Required Braces or Orthoses: Sling Restrictions Weight Bearing Restrictions: Yes RUE Weight Bearing: Non weight bearing      Mobility  Bed Mobility               General bed mobility comments: pt received in chair and plans to sleep in recliner at home at d/c  Transfers Overall transfer level: Needs assistance Equipment used: None Transfers: Sit to/from Stand Sit to Stand: Min guard         General transfer comment: close guarding as pt had low BP in the AM but BP remained stable with standing.   Ambulation/Gait Ambulation/Gait assistance: Min  assist Ambulation Distance (Feet): 100 Feet Assistive device: None Gait Pattern/deviations: Step-through pattern;Decreased stride length Gait velocity: decreased Gait velocity interpretation: Below normal speed for age/gender General Gait Details: slow, guarded gait with increased tension R side body. Discussed posture with ambulation and all daily activities.   Stairs            Wheelchair Mobility    Modified Rankin (Stroke Patients Only)       Balance Overall balance assessment: No apparent balance deficits (not formally assessed)                                           Pertinent Vitals/Pain Pain Assessment: Faces Faces Pain Scale: Hurts even more Pain Location: R shoulder Pain Descriptors / Indicators: Aching;Constant;Pressure;Guarding Pain Intervention(s): Limited activity within patient's tolerance;Monitored during session;Premedicated before session    Home Living Family/patient expects to be discharged to:: Private residence Living Arrangements: Spouse/significant other Available Help at Discharge: Family;Available PRN/intermittently Type of Home: House Home Access: Level entry;Stairs to enter Entrance Stairs-Rails: Left;Right Entrance Stairs-Number of Steps: 3 Home Layout: One level Home Equipment: Cane - single point Additional Comments: pt has stairs at front but also carport with level entry    Prior Function Level of Independence: Independent         Comments: works independently as Clinical biochemist. Wife works 8-2 each day and does not have any time off     Hand Dominance   Dominant Hand: Right    Extremity/Trunk Assessment  Upper Extremity Assessment Upper Extremity Assessment: Defer to OT evaluation    Lower Extremity Assessment Lower Extremity Assessment: Overall WFL for tasks assessed    Cervical / Trunk Assessment Cervical / Trunk Assessment: Normal  Communication   Communication: No difficulties  Cognition  Arousal/Alertness: Awake/alert Behavior During Therapy: WFL for tasks assessed/performed Overall Cognitive Status: Within Functional Limits for tasks assessed                                        General Comments General comments (skin integrity, edema, etc.): discussed roll of UE's with balance and educated on caution needed during initial phase of healing. Also recommended SPC when out in community.     Exercises     Assessment/Plan    PT Assessment Patient needs continued PT services  PT Problem List Decreased strength;Decreased range of motion;Decreased mobility;Pain;Decreased knowledge of precautions;Decreased knowledge of use of DME;Decreased coordination       PT Treatment Interventions DME instruction;Gait training;Stair training;Functional mobility training;Therapeutic activities;Therapeutic exercise;Patient/family education    PT Goals (Current goals can be found in the Care Plan section)  Acute Rehab PT Goals Patient Stated Goal: to be able to use my arm PT Goal Formulation: With patient Time For Goal Achievement: 11/02/16 Potential to Achieve Goals: Good    Frequency Min 3X/week   Barriers to discharge Decreased caregiver support home alone in day    Co-evaluation               AM-PAC PT "6 Clicks" Daily Activity  Outcome Measure Difficulty turning over in bed (including adjusting bedclothes, sheets and blankets)?: Total Difficulty moving from lying on back to sitting on the side of the bed? : Total Difficulty sitting down on and standing up from a chair with arms (e.g., wheelchair, bedside commode, etc,.)?: Total Help needed moving to and from a bed to chair (including a wheelchair)?: A Little Help needed walking in hospital room?: A Little Help needed climbing 3-5 steps with a railing? : A Little 6 Click Score: 12    End of Session Equipment Utilized During Treatment: Gait belt Activity Tolerance: Patient tolerated treatment  well Patient left: in chair;with call bell/phone within reach;with family/visitor present Nurse Communication: Mobility status PT Visit Diagnosis: Unsteadiness on feet (R26.81);Pain Pain - Right/Left: Right Pain - part of body: Shoulder    Time: 2563-8937 PT Time Calculation (min) (ACUTE ONLY): 32 min   Charges:   PT Evaluation $PT Eval Moderate Complexity: 1 Procedure PT Treatments $Gait Training: 8-22 mins   PT G Codes:        Leighton Roach, PT  Acute Rehab Services  [place] 10/26/2016, 3:35 PM

## 2016-10-26 NOTE — Significant Event (Signed)
Rapid Response Event Note RN called for SBP 70-80's and change in mental status Overview: Time Called: 2327 Arrival Time: 2335 Event Type: Hypotension  Initial Focused Assessment: On arrival pt lying supine in bed fully alert and oriented x3, answering all questions appropriately, follows all commands, skin warm and dry, denies any CP or SOB. MD paged prior to my arrival, see progress note, new orders given and completed by RN. SBP remains mid 70's after 500 cc bolus LR given. Pt endorses some "discomfort" around Right shoulder and Right anterior chest just lateral to Right shoulder. Pt reports he was able to eat some of his food but became "nauseous and sweaty" which subsided when he laid his head flat. Pt had not received any additional pain medication since arriving to floor. Dr. Maretta Los had consulted with Dr. Titus Mould prior to my arrival. Dr. Percell Miller and Dr. Myna Hidalgo at bedside to evaluate patient. New orders given from Dr. Titus Mould and completed.   Interventions: EKG showed SB with first degree AV block, Lactic Acid 2.4, 1 liter bolus of NS given, CBC, BMP, troponin 0.03, new ice pack applied to Right shoulder, set up in bed heated up a frozen dinner for patient. Transfer to SDU pending results of labs and BP after liter bolus. Hoyle Sauer RN  and Roni charge RN made aware of plan.  Transferred to 88m11    Event Summary: Name of Physician Notified: Dr. Percell Miller  at  (PTA RRT )  Name of Consulting Physician Notified: Dr. Titus Mould  at 2355  Outcome: Transferred (Comment) (SDU)     Gevena Mart, Sela Hua

## 2016-10-26 NOTE — Progress Notes (Signed)
On Call MD and RRT RN at bedside at [place] to evaluate pt.

## 2016-10-26 NOTE — Progress Notes (Signed)
[facility]                                                                              Patient Demographics  Nathan Wallace, is a 66 y.o. male, DOB - 1950/09/20, INO:[id number]  Admit date - 10/25/2016   Admitting Physician Marchia Bond, MD  Outpatient Primary MD for the patient is Biagio Borg, MD  Outpatient specialists:   LOS - 1  days   Medical records reviewed and are as summarized below:    No chief complaint on file.      Brief summary   Patient is a 66 year old male with history of hypertension, hyperlipidemia, right rotator cuff arthropathy status post reverse right total shoulder replacement on 7/17 and subsequently had episode of hypotension with transient change in mental status. BP persisted in 70s/50s, postoperatively and minimally improved after IV fluid bolus. Rapid response was called and critical care was noted as well. Tried hospitalist service was consulted for further workup.   Assessment & Plan    Principal Problem:   Rotator cuff tear arthropathy of right shoulder, with instabilityStatus post replacement - Per primary team  Active Problems:   Acute encephalopathy - Currently alert and oriented 3, likely transiently worsened due to acute hypotension, no focal neurological deficits were identified    Hypotensive episode - Possibly due to hypovolemia/hypovolemic shock - Has underlying history of hypertension - Continue to hold amlodipine, Avapro - Lactic acid was elevated 2.4, patient received IV fluid boluses, continue IV fluid hydration - Checked am cortisol level 18.6, hence rules out adrenal insufficiency - B12 283 - No other complaints of chest pain and shortness of breath, acute back pain to suggest any cardiac or pulmonary cause, no hypoxia. Troponin less than 0.03. EKG normal  Macrocytic anemia - B12 283, folate 6.5 - Drinks alcohol, last drink on Monday  Alcohol use - Has macrocytosis on CBC, placed on CIWA  protocol out of precaution to avoid any withdrawal - States that he does not drink heavily  Code Status: Full CODE STATUS DVT Prophylaxis:  SCD's per ortho Family Communication: Discussed in detail with the patient, all imaging results, lab results explained to the patient    Disposition Plan: Continue step down today  Time Spent in minutes  25 minutes  Procedures:  Shoulder repair right    Medications  Scheduled Meds: . allopurinol  300 mg Oral Daily  . aspirin  81 mg Oral Daily  . atorvastatin  40 mg Oral Daily  . cholecalciferol  2,000 Units Oral Daily  . docusate sodium  100 mg Oral BID  . gabapentin  600 mg Oral TID  . senna  1 tablet Oral BID  . vitamin C with rose hips  1,000 mg Oral Daily   Continuous Infusions: . sodium chloride 125 mL/hr at 10/26/16 0045   PRN Meds:.acetaminophen **OR** acetaminophen, alum & mag hydroxide-simeth, bisacodyl, colchicine, diphenhydrAMINE, HYDROmorphone (DILAUDID) injection, magnesium citrate, menthol-cetylpyridinium **OR** phenol, metoCLOPramide **OR** metoCLOPramide (REGLAN) injection, ondansetron **OR** ondansetron (ZOFRAN) IV, oxyCODONE, polyethylene glycol, tiZANidine, zolpidem   Antibiotics   Anti-infectives    Start     Dose/Rate Route Frequency Ordered Stop  10/25/16 1800  ceFAZolin (ANCEF) IVPB 1 g/50 mL premix     1 g 100 mL/hr over 30 Minutes Intravenous Every 6 hours 10/25/16 1459 10/26/16 0545   10/25/16 1030  ceFAZolin (ANCEF) IVPB 2g/100 mL premix     2 g 200 mL/hr over 30 Minutes Intravenous To [facility] 10/24/16 1306 10/25/16 1100        Subjective:   Nathan Wallace was seen and examined today.  Feels somewhat better today, overnight issues noted. Still having pain in the right shoulder. Patient denies dizziness, chest pain, shortness of breath, abdominal pain, N/V/D/C, new weakness, numbess, tingling.  Objective:   Vitals:   10/26/16 0530 10/26/16 0600 10/26/16 0700 10/26/16 0730  BP: (!)  89/60 95/63 90/66    Pulse: (!) 58 64 63   Resp: 13 15 15    Temp:    98.1 F (36.7 C)  TempSrc:    Oral  SpO2: 92% (!) 89% 92%   Weight:      Height:        Intake/Output Summary (Last 24 hours) at 10/26/16 1151 Last data filed at 10/26/16 0545  Gross per 24 hour  Intake             5340 ml  Output             1975 ml  Net             3365 ml     Wt Readings from Last 3 Encounters:  10/25/16 70.3 kg (155 lb)  10/17/16 70.4 kg (155 lb 3.3 oz)  08/12/16 70.2 kg (154 lb 12.2 oz)     Exam  General: Alert and oriented x 3, NAD  Eyes: PERRLA, EOMI, Anicteric Sclera,  HEENT:   Cardiovascular: S1 S2 auscultated, no rubs, murmurs or gallops. Regular rate and rhythm.  Respiratory: Clear to auscultation bilaterally, no wheezing, rales or rhonchi  Gastrointestinal: Soft, nontender, nondistended, + bowel sounds  Ext: no pedal edema bilaterally, Right shoulder in the sling  Neuro: AAOx3, Cr N's II- XII. Strength 5/5 upper and lower extremities left, right arm in the sling, RLE 5/5, speech clear, sensations grossly intact  Musculoskeletal: No digital cyanosis, clubbing  Skin: No rashes  Psych: Normal affect and demeanor, alert and oriented x3    Data Reviewed:  I have personally reviewed following labs and imaging studies  Micro Results Recent Results (from the past 240 hour(s))  Surgical pcr screen     Status: None   Collection Time: 10/17/16  9:10 AM  Result Value Ref Range Status   MRSA, PCR NEGATIVE NEGATIVE Final   Staphylococcus aureus NEGATIVE NEGATIVE Final    Comment:        The Xpert SA Assay (FDA approved for NASAL specimens in patients over 22 years of age), is one component of a comprehensive surveillance program.  Test performance has been validated by [facility] for patients greater than or equal to 93 year old. It is not intended to diagnose infection nor to guide or monitor treatment.     Radiology Reports Dg Shoulder Right Port  Result  Date: 10/25/2016 CLINICAL DATA:  Status post right shoulder arthroplasty. EXAM: PORTABLE RIGHT SHOULDER COMPARISON:  06/26/2016. FINDINGS: Interval right glenohumeral prosthesis in satisfactory position and alignment. No fracture or dislocation seen. IMPRESSION: Satisfactory postoperative appearance of a right glenohumeral prosthesis. Electronically Signed   By: Claudie Revering M.D.   On: 10/25/2016 15:22    Lab Data:  CBC:  Recent Labs Lab 10/26/16  0025 10/26/16 0439  WBC 8.8 8.8  NEUTROABS 6.1  --   HGB 10.8* 10.1*  HCT 31.8* 30.1*  MCV 100.6* 101.0*  PLT 204 329   Basic Metabolic Panel:  Recent Labs Lab 10/26/16 0025 10/26/16 0439  NA 137 137  K 3.9 3.8  CL 105 109  CO2 27 21*  GLUCOSE 121* 109*  BUN 11 10  CREATININE 1.17 1.06  CALCIUM 8.5* 8.1*   GFR: Estimated Creatinine Clearance: 68.2 mL/min (by C-G formula based on SCr of 1.06 mg/dL). Liver Function Tests: No results for input(s): AST, ALT, ALKPHOS, BILITOT, PROT, ALBUMIN in the last 168 hours. No results for input(s): LIPASE, AMYLASE in the last 168 hours. No results for input(s): AMMONIA in the last 168 hours. Coagulation Profile: No results for input(s): INR, PROTIME in the last 168 hours. Cardiac Enzymes:  Recent Labs Lab 10/26/16 0025  TROPONINI <0.03   BNP (last 3 results) No results for input(s): PROBNP in the last 8760 hours. HbA1C: No results for input(s): HGBA1C in the last 72 hours. CBG:  Recent Labs Lab 10/25/16 1025  GLUCAP 116*   Lipid Profile: No results for input(s): CHOL, HDL, LDLCALC, TRIG, CHOLHDL, LDLDIRECT in the last 72 hours. Thyroid Function Tests: No results for input(s): TSH, T4TOTAL, FREET4, T3FREE, THYROIDAB in the last 72 hours. Anemia Panel:  Recent Labs  10/26/16 0701  VITAMINB12 283  FOLATE 6.5   Urine analysis:    Component Value Date/Time   COLORURINE YELLOW 01/06/2016 1703   APPEARANCEUR CLEAR 01/06/2016 1703   LABSPEC 1.046 (H) 01/06/2016 1703    PHURINE 5.5 01/06/2016 1703   GLUCOSEU NEGATIVE 01/06/2016 1703   GLUCOSEU NEGATIVE 09/23/2015 1421   HGBUR NEGATIVE 01/06/2016 1703   BILIRUBINUR NEGATIVE 01/06/2016 1703   KETONESUR NEGATIVE 01/06/2016 1703   PROTEINUR NEGATIVE 01/06/2016 1703   UROBILINOGEN 0.2 09/23/2015 1421   NITRITE NEGATIVE 01/06/2016 1703   LEUKOCYTESUR NEGATIVE 01/06/2016 1703     Ripudeep Rai M.D. [facility] 10/26/2016, 11:51 AM  Pager: [phone] Between 7am to 7pm - call Pager - [phone]  After 7pm go to [email or web address] - password [email or web address]  Call night coverage person covering after 7pm

## 2016-10-26 NOTE — Progress Notes (Signed)
CRITICAL VALUE ALERT  Critical Value:Lactic Acid 2.4  Date & Time Notied:  10/26/2016 @ 0119  Provider Notified: Opyd  Orders Received/Actions taken: Transfer to [facility]

## 2016-10-26 NOTE — Anesthesia Postprocedure Evaluation (Signed)
Anesthesia Post Note  Patient: Nathan Wallace  Procedure(s) Performed: Procedure(s) (LRB): RIGHT TOTAL SHOULDER ARTHROPLASTY (Right)     Anesthesia Post Evaluation  Last Vitals:  Vitals:   10/26/16 0700 10/26/16 0730  BP: 90/66   Pulse: 63   Resp: 15   Temp:  36.7 C    Last Pain:  Vitals:   10/26/16 0730  TempSrc: Oral  PainSc:                  Riccardo Dubin

## 2016-10-26 NOTE — Care Management Note (Signed)
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 He has children but all of them work,  He will be alone when wife is at work.  He has hand bars in his shower, he states he has a PCP and medication coverage. He presented to SDU after R shoulder arthroplasty for  Rotator cuff tear, rapid response was called for low BP and he was transferred to SDU.                Action/Plan: NCM will follow for dc needs.  Expected Discharge Date:                  Expected Discharge Plan:  [place]  In-House Referral:     Discharge planning Services  CM Consult  Post Acute Care Choice:    Choice offered to:     DME Arranged:    DME Agency:     HH Arranged:    [place] Agency:     Status of Service:  In process, will continue to follow  If discussed at Long Length of Stay Meetings, dates discussed:    Additional Comments:  Zenon Mayo, RN 10/26/2016, 11:56 AM

## 2016-10-26 NOTE — Progress Notes (Signed)
RN called first around 2246 stating pt hypotensive, easily aroused with verbal stimuli but unable to remain awake without verbal interaction, MD paged with new order for 500 cc bolus given and initiated, RN was not wanted me to be aware and place on radar list, I advised RN to keep me posted on BP and if no improvement. S/P reversal total Right shoulder replacement 12 hours ago. No additional narcotics given since arriving to floor. RN called a second time letting me know BP and mental status had improved. I received another call from RN at 2327 stating SBP 78/53 and harder to arouse. I advised RN to page MD and I would meet her at bedside. Please see RRT note for additional information.

## 2016-10-26 NOTE — Progress Notes (Signed)
     Subjective:  Patient reports pain as marked.  Patient had some dizziness, and "falling out" yesterday with lactic acid of 2.4.  Transferred to stepdown and fluid resuscitated.  Had hypotension.    Patient now feels better and mental status normal.    Objective:   VITALS:   Vitals:   10/26/16 0500 10/26/16 0530 10/26/16 0600 10/26/16 0700  BP: (!) 82/56 (!) 89/60 95/63 90/66   Pulse: 62 (!) 58 64 63  Resp: 13 13 15 15   Temp:      TempSrc:      SpO2: 94% 92% (!) 89% 92%  Weight:      Height:        Neurologically intact Dorsiflexion/Plantar flexion intact Incision: no drainage Sensation and motor intact over deltoid.  Lab Results  Component Value Date   WBC 8.8 10/26/2016   HGB 10.1 (L) 10/26/2016   HCT 30.1 (L) 10/26/2016   MCV 101.0 (H) 10/26/2016   PLT 195 10/26/2016   BMET    Component Value Date/Time   NA 137 10/26/2016 0439   NA 140 12/29/2015 0954   K 3.8 10/26/2016 0439   K 4.1 12/29/2015 0954   CL 109 10/26/2016 0439   CL 104 06/28/2012 1010   CO2 21 (L) 10/26/2016 0439   CO2 22 12/29/2015 0954   GLUCOSE 109 (H) 10/26/2016 0439   GLUCOSE 127 12/29/2015 0954   GLUCOSE 127 (H) 06/28/2012 1010   BUN 10 10/26/2016 0439   BUN 15.0 12/29/2015 0954   CREATININE 1.06 10/26/2016 0439   CREATININE 1.2 12/29/2015 0954   CALCIUM 8.1 (L) 10/26/2016 0439   CALCIUM 9.2 12/29/2015 0954   GFRNONAA >60 10/26/2016 0439   GFRAA >60 10/26/2016 0439     Assessment/Plan: 1 Day Post-Op   Principal Problem:   Rotator cuff tear arthropathy of right shoulder, with instability Active Problems:   HTN (hypertension)   Acute encephalopathy   Hypotensive episode   Rotator cuff arthropathy, right   S/P shoulder replacement   Advance diet Up with therapy if bp ok. Monitor low bp. Appreciate medical input.  Monitor in stepdown for now, possible transfer tomorrow.      Farhaan Mabee P 10/26/2016, 7:44 AM   Marchia Bond, MD Cell [phone]

## 2016-10-26 NOTE — Progress Notes (Addendum)
CCMD and RRT RN at bedside at [place] to evaluate pt.

## 2016-10-26 NOTE — Progress Notes (Signed)
Occupational Therapy Evaluation Patient Details Name: Nathan Wallace MRN: [id number] DOB: Mar 04, 1951 Today's Date: 10/26/2016    History of Present Illness 66 y.o. male who presents for preoperative history and physical with a diagnosis of right shoulder rotator cuff arthropathy with dislocation. Underwent R TSA. See chart for extensive PMH. Rapid response called 7/17 due to low BP - pt transferred to stepdown unit for fluid resusitation and monitoring.    Clinical Impression   PTA, pt independent with mobility and ADL and worked part time as an Clinical biochemist. Pt able to mobilize to chair - BP sitting EOB 102/65; in chair 93/65. HR 66. Other vitals stable. Began education on proper positioning of RUE for edema and pain management, in addition to education on HEP and ADL management. Will follow acutely to address established goals to facilitate safe DC home with intermittent S.    Follow Up Recommendations  DC plan and follow up therapy as arranged by surgeon;Supervision - Intermittent    Equipment Recommendations  None recommended by OT    Recommendations for Other Services       Precautions / Restrictions Precautions Precautions: Shoulder Type of Shoulder Precautions: conservative - no ROM shoulder. ROM elbow/wrist/hand  Precaution Booklet Issued: Yes (comment) Required Braces or Orthoses: Sling Restrictions Weight Bearing Restrictions: Yes RUE Weight Bearing: Non weight bearing      Mobility Bed Mobility Overal bed mobility: Needs Assistance Bed Mobility: Supine to Sit     Supine to sit: Supervision     General bed mobility comments: s to educate on NWB   Transfers Overall transfer level: Needs assistance   Transfers: Sit to/from Stand;Stand Pivot Transfers Sit to Stand: Min guard Stand pivot transfers: Min guard            Balance                                           ADL either performed or assessed with clinical judgement   ADL  Overall ADL's : Needs assistance/impaired Eating/Feeding: Set up;Sitting   Grooming: Moderate assistance;Sitting   Upper Body Bathing: Moderate assistance;Sitting   Lower Body Bathing: Moderate assistance;Sit to/from stand   Upper Body Dressing : Maximal assistance;Sitting   Lower Body Dressing: Moderate assistance;Sit to/from stand   Toilet Transfer: Magazine features editor Details (indicate cue type and reason): for safety due to low BP Toileting- Clothing Manipulation and Hygiene: Minimal assistance       Functional mobility during ADLs: Min guard General ADL Comments: Pt provided with handout for shoulder protocol. Educatedon proper positioning and [place] of ice for pain adn edema control.      Vision         Perception     Praxis      Pertinent Vitals/Pain Pain Assessment: 0-10 Pain Score: 10-Worst pain ever Pain Location: R shoulder Pain Descriptors / Indicators: Aching;Constant;Pressure;Guarding Pain Intervention(s): Limited activity within patient's tolerance;Ice applied;Repositioned     Hand Dominance Right   Extremity/Trunk Assessment Upper Extremity Assessment Upper Extremity Assessment: RUE deficits/detail RUE Deficits / Details: Block wearing off. Pt complaining of minimal numbness/tingling RUE: Unable to fully assess due to pain;Unable to fully assess due to immobilization RUE Coordination: decreased gross motor   Lower Extremity Assessment Lower Extremity Assessment: Overall WFL for tasks assessed   Cervical / Trunk Assessment Cervical / Trunk Assessment: Normal   Communication Communication Communication: No difficulties  Cognition Arousal/Alertness: Awake/alert Behavior During Therapy: WFL for tasks assessed/performed Overall Cognitive Status: Within Functional Limits for tasks assessed                                     General Comments       Exercises Exercises: Shoulder Shoulder Exercises Wrist Flexion:  AROM;Right;10 reps Wrist Extension: AROM;Right;10 reps Digit Composite Flexion: AROM;Right;10 reps Composite Extension: AROM;Right;10 reps   Shoulder Instructions Shoulder Instructions Donning/doffing shirt without moving shoulder: Moderate assistance Method for sponge bathing under operated UE: Moderate assistance Donning/doffing sling/immobilizer: Moderate assistance Correct positioning of sling/immobilizer: Moderate assistance ROM for elbow, wrist and digits of operated UE: Minimal assistance Sling wearing schedule (on at all times/off for ADL's): Minimal assistance Proper positioning of operated UE when showering: Minimal assistance Positioning of UE while sleeping: Minimal assistance    Home Living Family/patient expects to be discharged to:: Private residence Living Arrangements: Spouse/significant other Available Help at Discharge: Family;Available PRN/intermittently Type of Home: House Home Access: Level entry     Home Layout: One level     Bathroom Shower/Tub: Tub/shower unit;Curtain   Biochemist, clinical: Standard Bathroom Accessibility: Yes How Accessible: Accessible via walker Home Equipment: None          Prior Functioning/Environment Level of Independence: Independent        Comments: works independently as Firefighter        OT Problem List: Decreased strength;Decreased range of motion;Decreased knowledge of use of DME or AE;Decreased knowledge of precautions;Cardiopulmonary status limiting activity;Impaired UE functional use;Pain      OT Treatment/Interventions: Self-care/ADL training;Therapeutic exercise;DME and/or AE instruction;Therapeutic activities;Patient/family education    OT Goals(Current goals can be found in the care plan section) Acute Rehab OT Goals Patient Stated Goal: to be able to use my arm OT Goal Formulation: With patient Time For Goal Achievement: 11/09/16 Potential to Achieve Goals: Good ADL Goals Pt Will Perform Upper Body  Bathing: with caregiver independent in assisting;with supervision;sitting Pt Will Perform Upper Body Dressing: with supervision;with caregiver independent in assisting;sitting Pt Will Perform Lower Body Dressing: with supervision;with caregiver independent in assisting;sit to/from stand Pt Will Perform Tub/Shower Transfer: with modified independence;ambulating Pt/caregiver will Perform Home Exercise Program: Right Upper extremity;With written HEP provided;Independently;Increased ROM (per protocol) Additional ADL Goal #1: Pt/caregiver will independently verbalize understanidng of correct positioning of RUE in sitting/supine  OT Frequency: Min 3X/week   Barriers to D/C:            Co-evaluation              AM-PAC PT "6 Clicks" Daily Activity     Outcome Measure Help from another person eating meals?: A Little Help from another person taking care of personal grooming?: A Little Help from another person toileting, which includes using toliet, bedpan, or urinal?: A Little Help from another person bathing (including washing, rinsing, drying)?: A Lot Help from another person to put on and taking off regular upper body clothing?: A Lot Help from another person to put on and taking off regular lower body clothing?: A Little 6 Click Score: 16   End of Session Nurse Communication: Mobility status;Weight bearing status;Precautions;Other (comment) (managemetn of RUE)  Activity Tolerance: Patient tolerated treatment well Patient left: in chair;with call bell/phone within reach;with family/visitor present  OT Visit Diagnosis: Muscle weakness (generalized) (M62.81);Pain Pain - Right/Left: Right Pain - part of body: Shoulder  Time: 8338-2505 OT Time Calculation (min): 47 min Charges:  OT General Charges $OT Visit: 1 Procedure OT Evaluation $OT Eval Moderate Complexity: 1 Procedure OT Treatments $Self Care/Home Management : 8-22 mins $Therapeutic Activity: 8-22  mins G-Codes:     [facility], OT/L  [phone] 10/26/2016  Lenoard Helbert,HILLARY 10/26/2016, 11:42 AM

## 2016-10-26 NOTE — Anesthesia Postprocedure Evaluation (Addendum)
Anesthesia Post Note  Patient: Ames Hoban  Procedure(s) Performed: Procedure(s) (LRB): RIGHT TOTAL SHOULDER ARTHROPLASTY (Right)     Patient location during evaluation: PACU Anesthesia Type: General Level of consciousness: awake and alert Pain management: pain level controlled Vital Signs Assessment: post-procedure vital signs reviewed and stable Respiratory status: spontaneous breathing, nonlabored ventilation, respiratory function stable and patient connected to nasal cannula oxygen Cardiovascular status: blood pressure returned to baseline and stable Postop Assessment: no signs of nausea or vomiting Anesthetic complications: no    Last Vitals:  Vitals:   10/26/16 0700 10/26/16 0730  BP: 90/66   Pulse: 63   Resp: 15   Temp:  36.7 C    Last Pain:  Vitals:   10/26/16 0730  TempSrc: Oral  PainSc:                  Riccardo Dubin

## 2016-10-26 NOTE — Consult Note (Addendum)
Medical Consultation   Conor Wallace  JYN:[id number]  DOB: 12/24/1950  DOA: 10/25/2016  PCP: Biagio Borg, MD   Outpatient Specialists: Dr. Hulan Saas (Sports Medicine)  Requesting physician: Dr. Mardelle Matte (Orthopedic Surgery)   Reason for consultation: Episode of hypotension with AMS   History of Present Illness: Nathan Wallace is an 66 y.o. male, very pleasant, and with history of hypertension, hyperlipidemia, and right rotator cuff arthropathy with instability, status post reverse total right shoulder replacement 12 hours ago, now with a episode of hypotension with transient change in mental status. Patient underwent a reverse right total shoulder earlier in the day with estimated blood loss of 150 mL. He reportedly tolerated the procedure well with no immediate complications and was admitted to the medical/surgical unit in stable condition. He continued to do well up on the floor, not requiring any pain medications since leaving the PACU. He was noted to have a low blood pressure at approximately 9:45 PM, and assessment at that time reportedly revealed a somnolent patient, but easily roused. Blood pressure persisted in the 70's/50's, orthopedist was contacted by RN and given orders for a 500 mL LR bolus. Blood pressure improved only minimally with the bolus and patient remained quite drowsy. Rapid response RN and critical care M.D. were contacted and the patient was evaluated at the bedside by rapid response RN and the on-call orthopedist around midnight. By this time, patient seemed to be alert and with a normal mental status, his blood pressure however, remained low. The RN and critical care M.D. did not feel that ICU was needed, but that day medicine consultation would be prudent.   Pt reports feeling sore in the right shoulder and chest, but otherwise okay leading up to the episode. He describes eating some of his dinner tray, then developing acute nausea with cold sweat and  lightheadedness. Reports the feelings improved when the head of his bed was reclined. Reports feeling "okay" on my assessment, with sore right shoulder, but no other complaints, wants to finish his dinner.   The patient was treated with the 500 cc LR bolus and had a 1/2 NS with KCl infusion increased to 125 cc/hr. Narcan was not administered. No recent blood work was available. PCCM has ordered STAT labs, including chemistry panel, CBC, lactate, and troponin. These are being drawn now.   Review of Systems:  Review of Systems  Constitutional: Positive for malaise/fatigue. Negative for chills and fever.  HENT: Negative for congestion and sore throat.   Eyes: Negative for blurred vision and double vision.  Respiratory: Negative for cough, sputum production, shortness of breath and wheezing.   Cardiovascular: Negative for chest pain, palpitations and PND.  Gastrointestinal: Positive for nausea. Negative for abdominal pain, blood in stool, constipation, diarrhea, melena and vomiting.  Genitourinary: Negative for dysuria, frequency, hematuria and urgency.  Neurological: Positive for dizziness. Negative for sensory change, speech change, focal weakness, loss of consciousness and headaches.   Otherwise 10 point review of systems negative.    Past Medical History: Past Medical History:  Diagnosis Date  . AKI (acute kidney injury) ([address])   . Arthritis    RT KNEE AND RT SHOULDER  . B12 deficiency   . Benign head tremor   . Chest tightness    comes and goes   . CVA (cerebrovascular accident due to intracerebral hemorrhage) ([place]) 08/03/2011  . Degenerative joint disease of knee, right   .  Depression 07/26/2012   no problems now  . GERD (gastroesophageal reflux disease)    NO RECENT PROBLEMS AND NO MEDS  . History of blood transfusion   . History of kidney stones   . History of urinary tract infection   . Hyperlipidemia    border line  . Hypertension   . Impaired glucose tolerance 06/22/2011    . Left inguinal hernia   . MGUS (monoclonal gammopathy of unknown significance)   . Prostate cancer ([place]) 2011  . Rotator cuff tear arthropathy of right shoulder, with instability 10/25/2016  . Sepsis ([place])   . Stress incontinence   . Stroke [facility])    2013  . Syncope 2012   after prostate surgery; neg neurology work up.     Past Surgical History: Past Surgical History:  Procedure Laterality Date  . COLONOSCOPY    . INGUINAL HERNIA REPAIR  06/01/2011   Procedure: HERNIA REPAIR INGUINAL ADULT;  Surgeon: Adin Hector, MD;  Location: [facility];  Service: General;  Laterality: Left;  Left Inguinal Hernia Repair with Mesh  . ROBOT ASSISTED LAPAROSCOPIC RADICAL PROSTATECTOMY  jan 2012   da vinci  . SPINE SURGERY  2003 OR 2004   CERVICAL FUSION-STATES LIMITED ROM NECK  . TOTAL SHOULDER ARTHROPLASTY Right 10/25/2016  . URETHRAL SLING N/A 03/08/2016   Procedure: MALE SLING CYSTOSCOPY;  Surgeon: Bjorn Loser, MD;  Location: [facility];  Service: Urology;  Laterality: N/A;  . WRIST SURGERY     right      Allergies:   Allergies  Allergen Reactions  . No Known Allergies      Social History:  reports that he quit smoking about 50 years ago. His smoking use included Cigarettes. He has a 0.50 pack-year smoking history. He has never used smokeless tobacco. He reports that he drinks alcohol. He reports that he uses drugs, including Marijuana.   Family History: Family History  Problem Relation Age of Onset  . Hypertension Mother   . Aneurysm Mother        brain  . Cancer Father        colon  . Colon cancer Father   . Heart attack Brother     Physical Exam: Vitals:   10/25/16 2303 10/25/16 2318 10/25/16 2326 10/25/16 2349  BP: (!) 80/47 (!) 77/50 (!) 78/53 (!) 82/53  Pulse: (!) 55 (!) 54 (!) 56 (!) 54  Resp: 16 17 16 16   Temp:      TempSrc:      SpO2: 97% 95% 95% 96%  Weight:      Height:        Constitutional: Alert and awake, oriented x3, not in any acute  distress. Eyes: PERLA, EOMI, irises appear normal, anicteric sclera,  ENMT: external ears and nose appear normal, normal hearing. Lips appears normal, oropharynx mucosa, tongue, posterior pharynx appear normal  Neck: neck appears normal, no masses, normal ROM, no thyromegaly, no JVD  CVS: S1-S2 clear, no murmur rubs or gallops, no LE edema, normal pedal pulses  Respiratory:  clear to auscultation bilaterally, no wheezing, rales or rhonchi. Respiratory effort normal. No accessory muscle use.  Abdomen: soft nontender, nondistended, normal bowel sounds, no hepatosplenomegaly, no hernias  Musculoskeletal: : no cyanosis, clubbing or edema noted bilaterally. Right shoulder immobilized in sling, good cap refill, sensation, and motor function to right hand.  Neuro: Cranial nerves II-XII intact, strength, sensation, reflexes Psych: judgement and insight appear normal, stable mood and affect, mental status appropriate Skin: no rashes or  lesions or ulcers, no induration or nodules   Data reviewed:  I have personally reviewed following labs and imaging studies Labs:  CBC: No results for input(s): WBC, NEUTROABS, HGB, HCT, MCV, PLT in the last 168 hours.  Basic Metabolic Panel: No results for input(s): NA, K, CL, CO2, GLUCOSE, BUN, CREATININE, CALCIUM, MG, PHOS in the last 168 hours. GFR Estimated Creatinine Clearance: 77.7 mL/min (by C-G formula based on SCr of 0.93 mg/dL). Liver Function Tests: No results for input(s): AST, ALT, ALKPHOS, BILITOT, PROT, ALBUMIN in the last 168 hours. No results for input(s): LIPASE, AMYLASE in the last 168 hours. No results for input(s): AMMONIA in the last 168 hours. Coagulation profile No results for input(s): INR, PROTIME in the last 168 hours.  Cardiac Enzymes: No results for input(s): CKTOTAL, CKMB, CKMBINDEX, TROPONINI in the last 168 hours. BNP: Invalid input(s): POCBNP CBG:  Recent Labs Lab 10/25/16 1025  GLUCAP 116*   D-Dimer No results for  input(s): DDIMER in the last 72 hours. Hgb A1c No results for input(s): HGBA1C in the last 72 hours. Lipid Profile No results for input(s): CHOL, HDL, LDLCALC, TRIG, CHOLHDL, LDLDIRECT in the last 72 hours. Thyroid function studies No results for input(s): TSH, T4TOTAL, T3FREE, THYROIDAB in the last 72 hours.  Invalid input(s): FREET3 Anemia work up No results for input(s): VITAMINB12, FOLATE, FERRITIN, TIBC, IRON, RETICCTPCT in the last 72 hours. Urinalysis    Component Value Date/Time   COLORURINE YELLOW 01/06/2016 1703   APPEARANCEUR CLEAR 01/06/2016 1703   LABSPEC 1.046 (H) 01/06/2016 1703   PHURINE 5.5 01/06/2016 1703   GLUCOSEU NEGATIVE 01/06/2016 1703   GLUCOSEU NEGATIVE 09/23/2015 1421   HGBUR NEGATIVE 01/06/2016 1703   BILIRUBINUR NEGATIVE 01/06/2016 1703   KETONESUR NEGATIVE 01/06/2016 1703   PROTEINUR NEGATIVE 01/06/2016 1703   UROBILINOGEN 0.2 09/23/2015 1421   NITRITE NEGATIVE 01/06/2016 1703   LEUKOCYTESUR NEGATIVE 01/06/2016 1703     Microbiology Recent Results (from the past 240 hour(s))  Surgical pcr screen     Status: None   Collection Time: 10/17/16  9:10 AM  Result Value Ref Range Status   MRSA, PCR NEGATIVE NEGATIVE Final   Staphylococcus aureus NEGATIVE NEGATIVE Final    Comment:        The Xpert SA Assay (FDA approved for NASAL specimens in patients over 69 years of age), is one component of a comprehensive surveillance program.  Test performance has been validated by [facility] for patients greater than or equal to 64 year old. It is not intended to diagnose infection nor to guide or monitor treatment.        Inpatient Medications:   Scheduled Meds: . allopurinol  300 mg Oral Daily  . amLODipine  5 mg Oral Daily  . aspirin  81 mg Oral Daily  . atorvastatin  40 mg Oral Daily  . cholecalciferol  2,000 Units Oral Daily  . docusate sodium  100 mg Oral BID  . gabapentin  600 mg Oral TID  . irbesartan  300 mg Oral QHS  . senna  1  tablet Oral BID  . vitamin C with rose hips  1,000 mg Oral Daily   Continuous Infusions: . 0.45 % NaCl with KCl 20 mEq / L 125 mL/hr at 10/25/16 2219  .  ceFAZolin (ANCEF) IV 1 g (10/25/16 1817)     Radiological Exams on Admission: Dg Shoulder Right Port  Result Date: 10/25/2016 CLINICAL DATA:  Status post right shoulder arthroplasty. EXAM: PORTABLE RIGHT SHOULDER COMPARISON:  06/26/2016. FINDINGS: Interval right glenohumeral prosthesis in satisfactory position and alignment. No fracture or dislocation seen. IMPRESSION: Satisfactory postoperative appearance of a right glenohumeral prosthesis. Electronically Signed   By: Claudie Revering M.D.   On: 10/25/2016 15:22   EKG, personally interpreted: Sinus bradycardia (rate 55), 1st degree AV block. Not significantly changed from prior.   Impression/Recommendations  1. Hypotension, history of HTN  - BP was at goal at 14:55, next set of vitals at 21:29 with BP 78/57 - BP persisted in 70's/50's, improved only slightly after 500 cc LR bolus  - No chest pain or SOB to suggest and coronary or thromboembolic event; mental status back to baseline and no focal deficits present  - Labs, including CBC, BMP, lactate, and troponin just drawn now; EKG  - Suspect hypovolemia; plan to give 1 liter NS now, continue IVF hydration, follow-up pending labs - Hold home antihypertensives, Norvasc and irbesartan - Plan discussed with RN at bedside who feels comfortable keeping this patient where he is on a cardiac monitor while treating with IVF and awaiting labs; if fails to respond to the 1 liter NS as expected, may need transfer to higher level of care  ADDENDUM:  Labs have returned with normal WBC, 3 g drop in Hgb postoperatively, mild elevation in lactate, undetectable troponin, slight increase in SCr but otherwise reassuring chemistry panel. Plan to repeat lactate in 3 hrs, follow-up am CBC, transfer to SDU given persistently low BP. Liter bolus not finished yet.  PCCM is aware of patient and will be contacted as needed if MAP remains low after fluid-resuscitation.   2. Transient alteration in mental status  - Resolved, likely secondary to hypotension  - Narcan was not administered per RN at bedside  - No focal neurologic deficit identified   3. Right rotator cuff arthropathy with instability  - Status-post reverse total shoulder on 10/25/16     Thank you for this consultation.  Our [facility] hospitalist team will follow the patient with you.  Time Spent: 83 minutes.   Ilene Qua Worthy Boschert M.D. [facility] 10/26/2016, 12:20 AM

## 2016-10-26 NOTE — Progress Notes (Signed)
I have evaluated patient with rapid response nurse. He is comfortable and feels better. He remembers feeling sweaty and "out of it" previously  On exam he is A&o x3 and in no distress. BP is still low.   Labs and EKG pending  I agree with another fluid bolus after labs Hospitalist to eval and will transfer to stepdown for close monitoring   Nathan Wallace D

## 2016-10-26 NOTE — Progress Notes (Signed)
eLink Physician-Brief Progress Note Patient Name: Nathan Wallace DOB: 07-02-1950 MRN: [id number]   Date of Service  10/26/2016  HPI/Events of Note  Called by Ortho , rapid response was called for slight change in MS now resolved, hypotension. Narcan with improvement. BP improving I called rapid response. The pt is awake oriented, BP improving Had total shoulder today RR RN stating no need ICU  eICU Interventions  Will order cbc, bmet, trop, ecg Paged to consult [facility], may need sdu  Ortho okay with this     Intervention Category Major Interventions: Change in mental status - evaluation and management;Hypotension - evaluation and management;Hypovolemia - evaluation and treatment with fluids  FEINSTEIN,DANIEL J. 10/26/2016, 12:06 AM

## 2016-10-27 MED ORDER — AMLODIPINE BESYLATE 5 MG PO TABS: 5.0000 mg | ORAL_TABLET | Freq: Every day | ORAL | 1 refills | Status: DC

## 2016-10-27 NOTE — Progress Notes (Signed)
[facility]                                                                              Patient Demographics  Nathan Wallace, is a 66 y.o. male, DOB - 10/23/1950, GXQ:[id number]  Admit date - 10/25/2016   Admitting Physician Marchia Bond, MD  Outpatient Primary MD for the patient is Biagio Borg, MD  Outpatient specialists:   LOS - 2  days   Medical records reviewed and are as summarized below:    No chief complaint on file.      Brief summary   Patient is a 66 year old male with history of hypertension, hyperlipidemia, right rotator cuff arthropathy status post reverse right total shoulder replacement on 7/17 and subsequently had episode of hypotension with transient change in mental status. BP persisted in 70s/50s, postoperatively and minimally improved after IV fluid bolus. Rapid response was called and critical care was noted as well. Tried hospitalist service was consulted for further workup.   Assessment & Plan    Principal Problem:   Rotator cuff tear arthropathy of right shoulder, with instabilityStatus post replacement - Per primary team  Active Problems:   Acute encephalopathy - Currently alert and oriented 3, likely transiently worsened due to acute hypotension, no focal neurological deficits were identified    Hypotensive episode - Possibly due to hypovolemia/hypovolemic shock - Has underlying history of hypertension -  Lactic acid was elevated 2.4, patient received IV fluid boluses, continue IV fluid hydration - Checked am cortisol level 18.6, hence rules out adrenal insufficiency, B12 283 - No other complaints of chest pain and shortness of breath, acute back pain to suggest any cardiac or pulmonary cause, no hypoxia. Troponin less than 0.03. EKG normal - Patient reported that his BP has been running low at home when he takes amlodipine and irbesartan. He actually had syncopal episode due to hypotension in 12/2015. I have recommended him  to STOP irbesartan completely.  - Patient will HOLD amlodipine for a few days, check his BP daily, if BP starts trending up in 135-140's range, he can restart amlodipine. I explained these instructions to the patient and put them in AVS, DC'ed irbesartan in AVS. He needs to follow up his PCP in 2 weeks.     Macrocytic anemia - B12 283, folate 6.5 - Drinks alcohol, last drink on Monday  Alcohol use - Has macrocytosis on CBC, placed on CIWA protocol out of precaution to avoid any withdrawal - States that he does not drink heavily  Code Status: Full CODE STATUS DVT Prophylaxis:  SCD's per ortho Family Communication: Discussed in detail with the patient, all imaging results, lab results explained to the patient    Disposition Plan: per ORTHO. I will sign off, please call me with questions. BP now stable.   Time Spent in minutes  25 minutes  Procedures:  Shoulder repair right    Medications  Scheduled Meds: . allopurinol  300 mg Oral Daily  . aspirin  81 mg Oral Daily  . atorvastatin  40 mg Oral Daily  . cholecalciferol  2,000 Units Oral Daily  . docusate sodium  100 mg Oral  BID  . folic acid  1 mg Oral Daily  . gabapentin  600 mg Oral TID  . multivitamin with minerals  1 tablet Oral Daily  . senna  1 tablet Oral BID  . thiamine  100 mg Oral Daily   Or  . thiamine  100 mg Intravenous Daily  . vitamin C with rose hips  1,000 mg Oral Daily   Continuous Infusions:  PRN Meds:.acetaminophen **OR** acetaminophen, alum & mag hydroxide-simeth, bisacodyl, colchicine, diphenhydrAMINE, HYDROmorphone (DILAUDID) injection, LORazepam **OR** LORazepam, magnesium citrate, menthol-cetylpyridinium **OR** phenol, metoCLOPramide **OR** metoCLOPramide (REGLAN) injection, ondansetron **OR** ondansetron (ZOFRAN) IV, oxyCODONE, polyethylene glycol, tiZANidine, zolpidem   Antibiotics   Anti-infectives    Start     Dose/Rate Route Frequency Ordered Stop   10/25/16 1800  ceFAZolin (ANCEF) IVPB 1  g/50 mL premix     1 g 100 mL/hr over 30 Minutes Intravenous Every 6 hours 10/25/16 1459 10/26/16 0545   10/25/16 1030  ceFAZolin (ANCEF) IVPB 2g/100 mL premix     2 g 200 mL/hr over 30 Minutes Intravenous To [facility]Stay [facility] 10/24/16 1306 10/25/16 1100        Subjective:   Nathan Wallace was seen and examined today.  No complaints today, feels better. BP better. Patient denies dizziness, chest pain, shortness of breath, abdominal pain, N/V/D/C, new weakness, numbess, tingling.  Objective:   Vitals:   10/27/16 0000 10/27/16 0402 10/27/16 0700 10/27/16 0800  BP: 108/73 113/82 110/66 113/77  Pulse: 71 87 72 69  Resp: (!) 9 (!) 8 (!) 8 10  Temp: 99.1 F (37.3 C) 98.5 F (36.9 C)  98.5 F (36.9 C)  TempSrc: Oral Oral  Oral  SpO2: 98% 98% 100% 99%  Weight:      Height:       No intake or output data in the 24 hours ending 10/27/16 0815   Wt Readings from Last 3 Encounters:  10/25/16 70.3 kg (155 lb)  10/17/16 70.4 kg (155 lb 3.3 oz)  08/12/16 70.2 kg (154 lb 12.2 oz)     Exam Physical Exam General: Alert and oriented x 3, NAD Eyes:  HEENT:  Cardiovascular: S1 S2 clear. Regular rate and rhythm. No pedal edema b/l Respiratory: Clear to auscultation bilaterally, no wheezing, rales or rhonchi Gastrointestinal: Soft, nontender, nondistended, + bowel sounds Ext: no pedal edema bilaterally Neuro: no FND Musculoskeletal: No digital cyanosis, clubbing, right arm in sling Skin: No rashes Psych: Normal affect and demeanor, alert and oriented x3      Data Reviewed:  I have personally reviewed following labs and imaging studies  Micro Results Recent Results (from the past 240 hour(s))  Surgical pcr screen     Status: None   Collection Time: 10/17/16  9:10 AM  Result Value Ref Range Status   MRSA, PCR NEGATIVE NEGATIVE Final   Staphylococcus aureus NEGATIVE NEGATIVE Final    Comment:        The Xpert SA Assay (FDA approved for NASAL specimens in patients over 10  years of age), is one component of a comprehensive surveillance program.  Test performance has been validated by [facility] for patients greater than or equal to 5 year old. It is not intended to diagnose infection nor to guide or monitor treatment.     Radiology Reports Dg Shoulder Right Port  Result Date: 10/25/2016 CLINICAL DATA:  Status post right shoulder arthroplasty. EXAM: PORTABLE RIGHT SHOULDER COMPARISON:  06/26/2016. FINDINGS: Interval right glenohumeral prosthesis in satisfactory position and alignment. No fracture or  dislocation seen. IMPRESSION: Satisfactory postoperative appearance of a right glenohumeral prosthesis. Electronically Signed   By: Claudie Revering M.D.   On: 10/25/2016 15:22    Lab Data:  CBC:  Recent Labs Lab 10/26/16 0025 10/26/16 0439  WBC 8.8 8.8  NEUTROABS 6.1  --   HGB 10.8* 10.1*  HCT 31.8* 30.1*  MCV 100.6* 101.0*  PLT 204 409   Basic Metabolic Panel:  Recent Labs Lab 10/26/16 0025 10/26/16 0439  NA 137 137  K 3.9 3.8  CL 105 109  CO2 27 21*  GLUCOSE 121* 109*  BUN 11 10  CREATININE 1.17 1.06  CALCIUM 8.5* 8.1*   GFR: Estimated Creatinine Clearance: 68.2 mL/min (by C-G formula based on SCr of 1.06 mg/dL). Liver Function Tests: No results for input(s): AST, ALT, ALKPHOS, BILITOT, PROT, ALBUMIN in the last 168 hours. No results for input(s): LIPASE, AMYLASE in the last 168 hours. No results for input(s): AMMONIA in the last 168 hours. Coagulation Profile: No results for input(s): INR, PROTIME in the last 168 hours. Cardiac Enzymes:  Recent Labs Lab 10/26/16 0025  TROPONINI <0.03   BNP (last 3 results) No results for input(s): PROBNP in the last 8760 hours. HbA1C: No results for input(s): HGBA1C in the last 72 hours. CBG:  Recent Labs Lab 10/25/16 1025  GLUCAP 116*   Lipid Profile: No results for input(s): CHOL, HDL, LDLCALC, TRIG, CHOLHDL, LDLDIRECT in the last 72 hours. Thyroid Function Tests: No results  for input(s): TSH, T4TOTAL, FREET4, T3FREE, THYROIDAB in the last 72 hours. Anemia Panel:  Recent Labs  10/26/16 0701  VITAMINB12 283  FOLATE 6.5   Urine analysis:    Component Value Date/Time   COLORURINE YELLOW 01/06/2016 1703   APPEARANCEUR CLEAR 01/06/2016 1703   LABSPEC 1.046 (H) 01/06/2016 1703   PHURINE 5.5 01/06/2016 1703   GLUCOSEU NEGATIVE 01/06/2016 1703   GLUCOSEU NEGATIVE 09/23/2015 1421   HGBUR NEGATIVE 01/06/2016 1703   BILIRUBINUR NEGATIVE 01/06/2016 1703   KETONESUR NEGATIVE 01/06/2016 1703   PROTEINUR NEGATIVE 01/06/2016 1703   UROBILINOGEN 0.2 09/23/2015 1421   NITRITE NEGATIVE 01/06/2016 1703   LEUKOCYTESUR NEGATIVE 01/06/2016 1703     Kayhan Boardley M.D. [facility] 10/27/2016, 8:15 AM  Pager: [phone] Between 7am to 7pm - call Pager - [phone]  After 7pm go to [email or web address] - password [email or web address]  Call night coverage person covering after 7pm

## 2016-10-27 NOTE — Progress Notes (Signed)
Physical Therapy Treatment Patient Details Name: Nathan Wallace MRN: [id number] DOB: 14-Aug-1950 Today's Date: 10/27/2016    History of Present Illness 66 y.o. male who presents for preoperative history and physical with a diagnosis of right shoulder rotator cuff arthropathy with dislocation. Underwent R TSA. See chart for extensive PMH. Rapid response called 7/17 due to low BP - pt transferred to stepdown unit for fluid resusitation and monitoring.     PT Comments    Pt with improving mobility.   Follow Up Recommendations  DC plan and follow up therapy as arranged by surgeon     Equipment Recommendations  None recommended by PT    Recommendations for Other Services       Precautions / Restrictions Precautions Precautions: Shoulder Type of Shoulder Precautions: conservative - no ROM shoulder. ROM elbow/wrist/hand  Precaution Booklet Issued: No Precaution Comments: reviewed proper positioning of sling Required Braces or Orthoses: Sling Restrictions Weight Bearing Restrictions: Yes RUE Weight Bearing: Non weight bearing    Mobility  Bed Mobility Overal bed mobility: Modified Independent             General bed mobility comments: Incr time and HOB elevated  Transfers Overall transfer level: Needs assistance Equipment used: None Transfers: Sit to/from Stand Sit to Stand: Min guard         General transfer comment: On initial stand pt sat down on bed abruptly saying he felt dizzy. Checked sitting and standing BP with BP 124/65 in sitting and 111/59 in standing. No further c/o's of dizziness.  Ambulation/Gait Ambulation/Gait assistance: Supervision Ambulation Distance (Feet): 225 Feet Assistive device: Straight cane Gait Pattern/deviations: Step-through pattern;Decreased stride length Gait velocity: decreased Gait velocity interpretation: Below normal speed for age/gender General Gait Details: guarded gait but no LOB   Stairs            Wheelchair  Mobility    Modified Rankin (Stroke Patients Only)       Balance                                            Cognition Arousal/Alertness: Awake/alert Behavior During Therapy: WFL for tasks assessed/performed Overall Cognitive Status: Within Functional Limits for tasks assessed                                        Exercises      General Comments        Pertinent Vitals/Pain Pain Assessment: Faces Faces Pain Scale: Hurts even more Pain Location: R shoulder Pain Descriptors / Indicators: Aching;Pressure;Guarding Pain Intervention(s): Limited activity within patient's tolerance    Home Living                      Prior Function            PT Goals (current goals can now be found in the care plan section) Progress towards PT goals: Progressing toward goals    Frequency    Min 3X/week      PT Plan Current plan remains appropriate    Co-evaluation              AM-PAC PT "6 Clicks" Daily Activity  Outcome Measure  Difficulty turning over in bed (including adjusting bedclothes, sheets and blankets)?: A Little Difficulty moving from lying  on back to sitting on the side of the bed? : A Little Difficulty sitting down on and standing up from a chair with arms (e.g., wheelchair, bedside commode, etc,.)?: A Little Help needed moving to and from a bed to chair (including a wheelchair)?: A Little Help needed walking in hospital room?: A Little Help needed climbing 3-5 steps with a railing? : A Little 6 Click Score: 18    End of Session   Activity Tolerance: Patient tolerated treatment well Patient left: with call bell/phone within reach;in bed Nurse Communication: Mobility status PT Visit Diagnosis: Unsteadiness on feet (R26.81);Pain Pain - Right/Left: Right Pain - part of body: Shoulder     Time: 1309-1330 PT Time Calculation (min) (ACUTE ONLY): 21 min  Charges:  $Gait Training: 8-22 mins                     G Codes:       [facility] PT [place] 10/27/2016, 2:38 PM

## 2016-10-27 NOTE — Progress Notes (Signed)
Dinner tray removed from room this AM, 100% completed.

## 2016-10-27 NOTE — Progress Notes (Signed)
Occupational Therapy Treatment Patient Details Name: Nathan Wallace MRN: [id number] DOB: 02-Feb-1951 Today's Date: 10/27/2016    History of present illness 66 y.o. male who presents for preoperative history and physical with a diagnosis of right shoulder rotator cuff arthropathy with dislocation. Underwent R TSA. See chart for extensive PMH. Rapid response called 7/17 due to low BP - pt transferred to stepdown unit for fluid resusitation and monitoring.    OT comments  Pt making good progress. BP sitting 115/69; after ambulating around unit 136/63. Pt with increased tolerance of R elbow ROM and decreased complaints of pain with R shoulder. Began education regarding compensatory techniques for ADL. Feel pt will be appropriate for DC tomorrow after OT session if medically stable.   Follow Up Recommendations  DC plan and follow up therapy as arranged by surgeon;Supervision - Intermittent    Equipment Recommendations  3 in 1 bedside commode (as shower seat)     Recommendations for Other Services      Precautions / Restrictions Precautions Precautions: Shoulder Type of Shoulder Precautions: conservative - no ROM shoulder. ROM elbow/wrist/hand  Shoulder Interventions: Shoulder sling/immobilizer;Off for dressing/bathing/exercises Precaution Booklet Issued: No Precaution Comments: reviewed proper positioning of sling Required Braces or Orthoses: Sling Restrictions Weight Bearing Restrictions: Yes RUE Weight Bearing: Non weight bearing       Mobility Bed Mobility Overal bed mobility: Modified Independent             General bed mobility comments: Incr time and HOB elevated  Transfers Overall transfer level: Needs assistance Equipment used: None Transfers: Sit to/from Stand Sit to Stand: Supervision         General transfer comment: On initial stand pt sat down on bed abruptly saying he felt dizzy. Checked sitting and standing BP with BP 124/65 in sitting and 111/59 in  standing. No further c/o's of dizziness.    Balance Overall balance assessment: Needs assistance           Standing balance-Leahy Scale: Fair                             ADL either performed or assessed with clinical judgement   ADL Overall ADL's : Needs assistance/impaired                                     Functional mobility during ADLs: Min guard General ADL Comments: Began education on compensatory technqieus for dressing and bathing. Will need to review. Education on positioning of RUE in sitting. Son present. asked for pt to have clothing at next session so OT could teach pt/wife ADL management. Pt aqmbulated @ unit with minguard A. Encouraged pt to ambulate with nursing tonight Recommend use of 3in1 as shower seat to reduce risk of falls     Vision       Perception     Praxis      Cognition Arousal/Alertness: Awake/alert Behavior During Therapy: WFL for tasks assessed/performed Overall Cognitive Status: Within Functional Limits for tasks assessed                                          Exercises Exercises: Shoulder Shoulder Exercises Elbow Flexion: AROM;AAROM;Right;15 reps;Seated Elbow Extension: AROM;AAROM;Right;15 reps;Seated Wrist Flexion: AROM;Right;10 reps Wrist Extension: AROM;Right;10 reps Digit Composite Flexion:  AROM;Right;10 reps Composite Extension: AROM;Right;10 reps   Shoulder Instructions Shoulder Instructions Donning/doffing shirt without moving shoulder: Moderate assistance Method for sponge bathing under operated UE: Moderate assistance Donning/doffing sling/immobilizer: Minimal assistance Correct positioning of sling/immobilizer: Minimal assistance ROM for elbow, wrist and digits of operated UE: Supervision/safety Sling wearing schedule (on at all times/off for ADL's): Supervision/safety Proper positioning of operated UE when showering: Supervision/safety     General Comments       Pertinent Vitals/ Pain       Pain Assessment: 0-10 Pain Score: 5  Faces Pain Scale: Hurts even more Pain Location: R shoulder Pain Descriptors / Indicators: Aching;Pressure;Guarding Pain Intervention(s): Limited activity within patient's tolerance;Ice applied;Repositioned  Home Living                                          Prior Functioning/Environment              Frequency  Min 3X/week        Progress Toward Goals  OT Goals(current goals can now be found in the care plan section)  Progress towards OT goals: Progressing toward goals  Acute Rehab OT Goals Patient Stated Goal: to be able to use my arm OT Goal Formulation: With patient Time For Goal Achievement: 11/09/16 Potential to Achieve Goals: Good ADL Goals Pt Will Perform Upper Body Bathing: with caregiver independent in assisting;with supervision;sitting Pt Will Perform Upper Body Dressing: with supervision;with caregiver independent in assisting;sitting Pt Will Perform Lower Body Dressing: with supervision;with caregiver independent in assisting;sit to/from stand Pt Will Perform Tub/Shower Transfer: with modified independence;ambulating Pt/caregiver will Perform Home Exercise Program: Right Upper extremity;With written HEP provided;Independently;Increased ROM Additional ADL Goal #1: Pt/caregiver will independently verbalize understanidng of correct positioning of RUE in sitting/supine  Plan Discharge plan remains appropriate    Co-evaluation                 AM-PAC PT "6 Clicks" Daily Activity     Outcome Measure   Help from another person eating meals?: None Help from another person taking care of personal grooming?: A Little Help from another person toileting, which includes using toliet, bedpan, or urinal?: A Little Help from another person bathing (including washing, rinsing, drying)?: A Little Help from another person to put on and taking off regular upper body clothing?:  A Lot Help from another person to put on and taking off regular lower body clothing?: A Little 6 Click Score: 18    End of Session    OT Visit Diagnosis: Muscle weakness (generalized) (M62.81);Pain Pain - Right/Left: Right Pain - part of body: Shoulder   Activity Tolerance     Patient Left     Nurse Communication          Time: 4536-4680 OT Time Calculation (min): 33 min  Charges: OT Treatments $Self Care/Home Management : 8-22 mins $Therapeutic Exercise: 8-22 mins  [facility], OT/L  [phone] 10/27/2016   Chelsea Nusz,HILLARY 10/27/2016, 4:00 PM

## 2016-10-27 NOTE — Plan of Care (Signed)
Problem: Education: Goal: Knowledge of [facility] Education information/materials will improve Outcome: Completed/Met Date Met: 10/27/16 Participating in education   Problem: Pain Managment: Goal: General experience of comfort will improve Outcome: Progressing Pain medications given, see MAR, up to chair for comfort, able to reposition self, ice packs to right shoulder.   

## 2016-10-27 NOTE — Progress Notes (Signed)
     Subjective:  Patient reports pain as moderate.  No new events.  Doesn't feel ready to go home yet.  Still in stepdown.  Objective:   VITALS:   Vitals:   10/27/16 0000 10/27/16 0402 10/27/16 0700 10/27/16 0800  BP: 108/73 113/82 110/66 113/77  Pulse: 71 87 72 69  Resp: (!) 9 (!) 8 (!) 8 10  Temp: 99.1 F (37.3 C) 98.5 F (36.9 C)  98.5 F (36.9 C)  TempSrc: Oral Oral  Oral  SpO2: 98% 98% 100% 99%  Weight:      Height:        Neurologically intact Dorsiflexion/Plantar flexion intact Incision: scant drainage   Lab Results  Component Value Date   WBC 8.8 10/26/2016   HGB 10.1 (L) 10/26/2016   HCT 30.1 (L) 10/26/2016   MCV 101.0 (H) 10/26/2016   PLT 195 10/26/2016   BMET    Component Value Date/Time   NA 137 10/26/2016 0439   NA 140 12/29/2015 0954   K 3.8 10/26/2016 0439   K 4.1 12/29/2015 0954   CL 109 10/26/2016 0439   CL 104 06/28/2012 1010   CO2 21 (L) 10/26/2016 0439   CO2 22 12/29/2015 0954   GLUCOSE 109 (H) 10/26/2016 0439   GLUCOSE 127 12/29/2015 0954   GLUCOSE 127 (H) 06/28/2012 1010   BUN 10 10/26/2016 0439   BUN 15.0 12/29/2015 0954   CREATININE 1.06 10/26/2016 0439   CREATININE 1.2 12/29/2015 0954   CALCIUM 8.1 (L) 10/26/2016 0439   CALCIUM 9.2 12/29/2015 0954   GFRNONAA >60 10/26/2016 0439   GFRAA >60 10/26/2016 0439     Assessment/Plan: 2 Days Post-Op   Principal Problem:   Rotator cuff tear arthropathy of right shoulder, with instability Active Problems:   Acute encephalopathy   Hypotensive episode   S/P shoulder replacement   Advance diet Up with therapy Transfer to regular floor, probable discharge home fri.   Sloane Junkin P 10/27/2016, 8:40 AM   Marchia Bond, MD Cell [phone]

## 2016-10-28 NOTE — Progress Notes (Signed)
PT Cancellation Note  Patient Details Name: Nathan Wallace MRN: [id number] DOB: 03-Dec-1950   Cancelled Treatment:    Reason Eval/Treat Not Completed: Patient declined, no reason specified. On arrival pt was up ambulating in room without any noted deficits. Planned on stair training with pt and pt reported he does not have stairs at home and is ready to d/c. Cancelling treatment per pts request.   Benjiman Core, PTA Pager [phone] Acute Rehab  Allena Katz 10/28/2016, 12:15 PM

## 2016-10-28 NOTE — Progress Notes (Signed)
Occupational Therapy Treatment Patient Details Name: Nathan Wallace MRN: [id number] DOB: 04/27/1950 Today's Date: 10/28/2016    History of present illness 66 y.o. male who presents for preoperative history and physical with a diagnosis of right shoulder rotator cuff arthropathy with dislocation. Underwent R TSA. See chart for extensive PMH. Rapid response called 7/17 due to low BP - pt transferred to stepdown unit for fluid resusitation and monitoring.    OT comments  Pt making excellent progress. Completed education with pt/friend regarding RUE HEP protocol, compensatory techniques for ADL and mobility and education on home safety and reducing risk of falls. Pt to follow up with MD to progress rehab of shoulder.   Follow Up Recommendations  DC plan and follow up therapy as arranged by surgeon;Supervision - Intermittent    Equipment Recommendations  3 in 1 bedside commode    Recommendations for Other Services      Precautions / Restrictions Precautions Precautions: Shoulder Type of Shoulder Precautions: conservative - no ROM shoulder. ROM elbow/wrist/hand  Shoulder Interventions: Shoulder sling/immobilizer;Off for dressing/bathing/exercises Precaution Booklet Issued: Yes (comment) Required Braces or Orthoses: Sling Restrictions Weight Bearing Restrictions: Yes RUE Weight Bearing: Non weight bearing       Mobility Bed Mobility Overal bed mobility: Modified Independent                Transfers Overall transfer level: Modified independent                    Balance                                           ADL either performed or assessed with clinical judgement   ADL                                         General ADL Comments: Completed education with pt regarding compensatory technqieus for ADL Pt requires vc as reminders to not actively move R shoulder during ADl. Pt's friend present for education. Educated on home  safety adn recommend pt use showr seat for bathing. Pt verblaized understanding.      Vision       Perception     Praxis      Cognition Arousal/Alertness: Awake/alert Behavior During Therapy: WFL for tasks assessed/performed Overall Cognitive Status: Within Functional Limits for tasks assessed                                          Exercises Exercises: Shoulder Shoulder Exercises Elbow Flexion: AROM;Right;10 reps;Seated Elbow Extension: AROM;Right;10 reps;Seated Wrist Flexion: AROM;Right;10 reps;Seated Wrist Extension: AROM;Right;10 reps;Seated Digit Composite Flexion: AROM;Right;10 reps Composite Extension: AROM;Right;10 reps;Seated   Shoulder Instructions Shoulder Instructions Donning/doffing shirt without moving shoulder: Supervision/safety Method for sponge bathing under operated UE: Supervision/safety Donning/doffing sling/immobilizer: Supervision/safety Correct positioning of sling/immobilizer: Supervision/safety ROM for elbow, wrist and digits of operated UE: Modified independent Sling wearing schedule (on at all times/off for ADL's): Modified independent Proper positioning of operated UE when showering: Supervision/safety Positioning of UE while sleeping: Supervision/safety     General Comments      Pertinent Vitals/ Pain       Pain Assessment: 0-10 Pain Score: 4  Pain  Location: R shoulder Pain Descriptors / Indicators: Aching;Pressure;Guarding Pain Intervention(s): Limited activity within patient's tolerance  Home Living                                          Prior Functioning/Environment              Frequency  Min 3X/week        Progress Toward Goals  OT Goals(current goals can now be found in the care plan section)  Progress towards OT goals: Goals met/education completed, patient discharged from OT  Acute Rehab OT Goals Patient Stated Goal: to be able to use my arm OT Goal Formulation: With  patient Time For Goal Achievement: 11/09/16 Potential to Achieve Goals: Good ADL Goals Pt Will Perform Upper Body Bathing: with caregiver independent in assisting;with supervision;sitting Pt Will Perform Upper Body Dressing: with supervision;with caregiver independent in assisting;sitting Pt Will Perform Lower Body Dressing: with supervision;with caregiver independent in assisting;sit to/from stand Pt Will Perform Tub/Shower Transfer: with modified independence;ambulating Pt/caregiver will Perform Home Exercise Program: Right Upper extremity;With written HEP provided;Independently;Increased ROM Additional ADL Goal #1: Pt/caregiver will independently verbalize understanidng of correct positioning of RUE in sitting/supine  Plan Discharge plan remains appropriate    Co-evaluation                 AM-PAC PT "6 Clicks" Daily Activity     Outcome Measure   Help from another person eating meals?: None Help from another person taking care of personal grooming?: None Help from another person toileting, which includes using toliet, bedpan, or urinal?: None Help from another person bathing (including washing, rinsing, drying)?: A Little Help from another person to put on and taking off regular upper body clothing?: A Little Help from another person to put on and taking off regular lower body clothing?: None 6 Click Score: 22    End of Session    OT Visit Diagnosis: Muscle weakness (generalized) (M62.81);Pain Pain - Right/Left: Right Pain - part of body: Shoulder   Activity Tolerance Patient tolerated treatment well   Patient Left Other (comment) (sitting on loveseat with friend)   Nurse Communication Other (comment) (ready for DC)        Time: 3790-2409 OT Time Calculation (min): 16 min  Charges: OT General Charges $OT Visit: 1 Procedure OT Treatments $Self Care/Home Management : 8-22 mins  [facility], OT/L  [phone] 10/28/2016   Jullien Granquist,HILLARY 10/28/2016, 4:06  PM

## 2016-10-28 NOTE — Care Management Important Message (Signed)
Important Message  Patient Details  Name: Nathan Wallace MRN: [id number] Date of Birth: May 14, 1950   Medicare Important Message Given:  Yes    [name] May 10/28/2016, 10:42 AM

## 2016-10-28 NOTE — Discharge Summary (Signed)
Physician Discharge Summary  Patient ID: Nathan Wallace MRN: [id number] DOB/AGE: January 29, 1951 66 y.o.  Admit date: 10/25/2016 Discharge date: 10/28/2016  Admission Diagnoses:  Rotator cuff tear arthropathy of right shoulder  Discharge Diagnoses:  Principal Problem:   Rotator cuff tear arthropathy of right shoulder, with instability Active Problems:   Acute encephalopathy   Hypotensive episode   S/P shoulder replacement   Past Medical History:  Diagnosis Date  . AKI (acute kidney injury) ([place])   . Arthritis    RT KNEE AND RT SHOULDER  . B12 deficiency   . Benign head tremor   . Chest tightness    comes and goes   . CVA (cerebrovascular accident due to intracerebral hemorrhage) ([place]) 08/03/2011  . Degenerative joint disease of knee, right   . Depression 07/26/2012   no problems now  . GERD (gastroesophageal reflux disease)    NO RECENT PROBLEMS AND NO MEDS  . History of blood transfusion   . History of kidney stones   . History of urinary tract infection   . Hyperlipidemia    border line  . Hypertension   . Impaired glucose tolerance 06/22/2011  . Left inguinal hernia   . MGUS (monoclonal gammopathy of unknown significance)   . Prostate cancer ([place]) 2011  . Rotator cuff tear arthropathy of right shoulder, with instability 10/25/2016  . Sepsis ([place])   . Stress incontinence   . Stroke [facility])    2013  . Syncope 2012   after prostate surgery; neg neurology work up.     Surgeries: Procedure(s): RIGHT TOTAL SHOULDER ARTHROPLASTY on 10/25/2016   Consultants (if any):   Discharged Condition: Improved  Hospital Course: Nathan Wallace is an 66 y.o. male who was admitted 10/25/2016 with a diagnosis of Rotator cuff tear arthropathy of right shoulder and went to the operating room on 10/25/2016 and underwent the above named procedures.    He was given perioperative antibiotics:  Anti-infectives    Start     Dose/Rate Route Frequency Ordered Stop   10/25/16 1800  ceFAZolin  (ANCEF) IVPB 1 g/50 mL premix     1 g 100 mL/hr over 30 Minutes Intravenous Every 6 hours 10/25/16 1459 10/26/16 0545   10/25/16 1030  ceFAZolin (ANCEF) IVPB 2g/100 mL premix     2 g 200 mL/hr over 30 Minutes Intravenous To [facility] 10/24/16 1306 10/25/16 1100    .  He was given sequential compression devices, early ambulation,  for DVT prophylaxis.  On the night of POD 0 he was hypotensive and somewhat lethargic and rapid response called and he was transferred to stepdown with a medical consult.  All tests were negative and he responded to IVF and then was transferred back to the floor.    He benefited maximally from the hospital stay and there were no complications.    Recent vital signs:  Vitals:   10/27/16 2050 10/28/16 0512  BP: 115/79 122/66  Pulse: 81 86  Resp: 16 16  Temp: (!) 100.7 F (38.2 C) (!) 100.4 F (38 C)    Recent laboratory studies:  Lab Results  Component Value Date   HGB 10.1 (L) 10/26/2016   HGB 10.8 (L) 10/26/2016   HGB 13.9 10/17/2016   Lab Results  Component Value Date   WBC 8.8 10/26/2016   PLT 195 10/26/2016   Lab Results  Component Value Date   INR 1.12 03/02/2016   Lab Results  Component Value Date   NA 137 10/26/2016   K 3.8  10/26/2016   CL 109 10/26/2016   CO2 21 (L) 10/26/2016   BUN 10 10/26/2016   CREATININE 1.06 10/26/2016   GLUCOSE 109 (H) 10/26/2016    Discharge Medications:   Allergies as of 10/28/2016      Reactions   No Known Allergies       Medication List    STOP taking these medications   HYDROcodone-acetaminophen 5-325 MG tablet Commonly known as:  NORCO/VICODIN   irbesartan 300 MG tablet Commonly known as:  AVAPRO     TAKE these medications   allopurinol 300 MG tablet Commonly known as:  ZYLOPRIM TAKE 1 TABLET BY MOUTH  DAILY   amLODipine 5 MG tablet Commonly known as:  NORVASC Take 1 tablet (5 mg total) by mouth daily. Please HOLD until follow-up with your PCP or SBP consistently above  135-140's What changed:  See the new instructions.   aspirin 81 MG chewable tablet Chew 1 tablet (81 mg total) by mouth daily.   atorvastatin 40 MG tablet Commonly known as:  LIPITOR TAKE 1 TABLET BY MOUTH  DAILY   B-12 COMPLIANCE INJECTION IJ Inject 1 Dose as directed every 30 (thirty) days.   colchicine 0.6 MG tablet Take 1 tablet (0.6 mg total) by mouth 2 (two) times daily. For 5 days when having flare.   gabapentin 300 MG capsule Commonly known as:  NEURONTIN TAKE 2 CAPSULES BY MOUTH 3  TIMES DAILY   ondansetron 4 MG tablet Commonly known as:  ZOFRAN Take 1 tablet (4 mg total) by mouth every 8 (eight) hours as needed for nausea or vomiting.   oxyCODONE 5 MG immediate release tablet Commonly known as:  ROXICODONE Take 1-2 tablets (5-10 mg total) by mouth every 4 (four) hours as needed for severe pain.   PRESCRIPTION MEDICATION 1 Syringe by Intracavernosal route every other day as needed (10-30 minutes before you have sex for ED). Matrix (Papaverine 30mg  Phentolamine 1mg  Alprostadil 64mcg per ml injection)  erectile dysfunction injection every other day as needed for sex   sennosides-docusate sodium 8.6-50 MG tablet Commonly known as:  SENOKOT-S Take 2 tablets by mouth daily.   tiZANidine 4 MG tablet Commonly known as:  ZANAFLEX TAKE 1 TABLET BY MOUTH 3  TIMES DAILY AS NEEDED FOR  MUSCLE SPASM(S)   vitamin C with rose hips 1000 MG tablet Take 1,000 mg by mouth daily.   Vitamin D-3 1000 units Caps Take 2,000 Units by mouth daily.       Diagnostic Studies: Dg Shoulder Right Port  Result Date: 10/25/2016 CLINICAL DATA:  Status post right shoulder arthroplasty. EXAM: PORTABLE RIGHT SHOULDER COMPARISON:  06/26/2016. FINDINGS: Interval right glenohumeral prosthesis in satisfactory position and alignment. No fracture or dislocation seen. IMPRESSION: Satisfactory postoperative appearance of a right glenohumeral prosthesis. Electronically Signed   By: Claudie Revering M.D.    On: 10/25/2016 15:22    Disposition: 01-Home or Self Care  Discharge Instructions    Discharge instructions    Complete by:  As directed    Please HOLD amlodipine until follow-up with your PCP or SBP consistently above 135-140's. Please check BP daily.  Please STOP Irbesartan.      Follow-up Information    Marchia Bond, MD. Schedule an appointment as soon as possible for a visit in 2 weeks.   Specialty:  Orthopedic Surgery Contact information: [place] [address] [phone]            Signed: Johnny Bridge 10/28/2016, 10:48 AM

## 2016-11-01 ENCOUNTER — Ambulatory Visit (INDEPENDENT_AMBULATORY_CARE_PROVIDER_SITE_OTHER): Payer: Medicare Other | Admitting: Emergency Medicine

## 2016-11-01 DIAGNOSIS — E538 Deficiency of other specified B group vitamins: Secondary | ICD-10-CM

## 2016-11-01 MED ORDER — CYANOCOBALAMIN 1000 MCG/ML IJ SOLN
1000.0000 ug | Freq: Once | INTRAMUSCULAR | Status: AC
  Administered 2016-11-01: 1000 ug via INTRAMUSCULAR

## 2016-11-01 NOTE — Progress Notes (Signed)
Medical screening examination/treatment/procedure(s) were performed by non-physician practitioner and as supervising physician I was immediately available for consultation/collaboration. I agree with above. Cristie Mckinney, MD   

## 2016-11-09 DIAGNOSIS — M19011 Primary osteoarthritis, right shoulder: Secondary | ICD-10-CM | POA: Diagnosis not present

## 2016-12-01 ENCOUNTER — Ambulatory Visit (INDEPENDENT_AMBULATORY_CARE_PROVIDER_SITE_OTHER): Payer: Medicare Other | Admitting: General Practice

## 2016-12-01 DIAGNOSIS — E538 Deficiency of other specified B group vitamins: Secondary | ICD-10-CM | POA: Diagnosis not present

## 2016-12-01 MED ORDER — CYANOCOBALAMIN 1000 MCG/ML IJ SOLN
1000.0000 ug | Freq: Once | INTRAMUSCULAR | Status: AC
  Administered 2016-12-01: 1000 ug via INTRAMUSCULAR

## 2016-12-01 NOTE — Progress Notes (Signed)
Medical screening examination/treatment/procedure(s) were performed by non-physician practitioner and as supervising physician I was immediately available for consultation/collaboration. I agree with above. Donelle Baba, MD   

## 2016-12-02 ENCOUNTER — Ambulatory Visit: Payer: Medicare Other

## 2016-12-07 DIAGNOSIS — M19011 Primary osteoarthritis, right shoulder: Secondary | ICD-10-CM | POA: Diagnosis not present

## 2016-12-09 DIAGNOSIS — Z96611 Presence of right artificial shoulder joint: Secondary | ICD-10-CM | POA: Diagnosis not present

## 2016-12-09 DIAGNOSIS — M25511 Pain in right shoulder: Secondary | ICD-10-CM | POA: Diagnosis not present

## 2016-12-09 DIAGNOSIS — M25611 Stiffness of right shoulder, not elsewhere classified: Secondary | ICD-10-CM | POA: Diagnosis not present

## 2016-12-13 DIAGNOSIS — M25511 Pain in right shoulder: Secondary | ICD-10-CM | POA: Diagnosis not present

## 2016-12-13 DIAGNOSIS — Z96611 Presence of right artificial shoulder joint: Secondary | ICD-10-CM | POA: Diagnosis not present

## 2016-12-13 DIAGNOSIS — M25611 Stiffness of right shoulder, not elsewhere classified: Secondary | ICD-10-CM | POA: Diagnosis not present

## 2016-12-15 DIAGNOSIS — M25611 Stiffness of right shoulder, not elsewhere classified: Secondary | ICD-10-CM | POA: Diagnosis not present

## 2016-12-15 DIAGNOSIS — Z96611 Presence of right artificial shoulder joint: Secondary | ICD-10-CM | POA: Diagnosis not present

## 2016-12-15 DIAGNOSIS — M25511 Pain in right shoulder: Secondary | ICD-10-CM | POA: Diagnosis not present

## 2016-12-20 DIAGNOSIS — Z96611 Presence of right artificial shoulder joint: Secondary | ICD-10-CM | POA: Diagnosis not present

## 2016-12-20 DIAGNOSIS — M25511 Pain in right shoulder: Secondary | ICD-10-CM | POA: Diagnosis not present

## 2016-12-20 DIAGNOSIS — M25611 Stiffness of right shoulder, not elsewhere classified: Secondary | ICD-10-CM | POA: Diagnosis not present

## 2016-12-29 ENCOUNTER — Other Ambulatory Visit (HOSPITAL_BASED_OUTPATIENT_CLINIC_OR_DEPARTMENT_OTHER): Payer: Medicare Other

## 2016-12-29 DIAGNOSIS — D472 Monoclonal gammopathy: Secondary | ICD-10-CM | POA: Diagnosis not present

## 2016-12-29 LAB — COMPREHENSIVE METABOLIC PANEL
ALBUMIN: 3.9 g/dL (ref 3.5–5.0)
ALK PHOS: 78 U/L (ref 40–150)
ALT: 23 U/L (ref 0–55)
ANION GAP: 7 meq/L (ref 3–11)
AST: 20 U/L (ref 5–34)
BILIRUBIN TOTAL: 1.19 mg/dL (ref 0.20–1.20)
BUN: 15.1 mg/dL (ref 7.0–26.0)
CO2: 29 meq/L (ref 22–29)
CREATININE: 1.1 mg/dL (ref 0.7–1.3)
Calcium: 9.7 mg/dL (ref 8.4–10.4)
Chloride: 105 mEq/L (ref 98–109)
EGFR: 81 mL/min/{1.73_m2} — ABNORMAL LOW (ref >90)
Glucose: 191 mg/dl — ABNORMAL HIGH (ref 70–140)
Potassium: 4.1 mEq/L (ref 3.5–5.1)
Sodium: 141 mEq/L (ref 136–145)
TOTAL PROTEIN: 7.4 g/dL (ref 6.4–8.3)

## 2016-12-29 LAB — CBC WITH DIFFERENTIAL/PLATELET
BASO%: 0.1 % (ref 0.0–2.0)
Basophils Absolute: 0 10*3/uL (ref 0.0–0.1)
EOS ABS: 0.1 10*3/uL (ref 0.0–0.5)
EOS%: 0.7 % (ref 0.0–7.0)
HEMATOCRIT: 40.8 % (ref 38.4–49.9)
HEMOGLOBIN: 13.6 g/dL (ref 13.0–17.1)
LYMPH#: 2.4 10*3/uL (ref 0.9–3.3)
LYMPH%: 35.2 % (ref 14.0–49.0)
MCH: 33.5 pg — ABNORMAL HIGH (ref 27.2–33.4)
MCHC: 33.3 g/dL (ref 32.0–36.0)
MCV: 100.5 fL — AB (ref 79.3–98.0)
MONO#: 0.3 10*3/uL (ref 0.1–0.9)
MONO%: 4.9 % (ref 0.0–14.0)
NEUT%: 59.1 % (ref 39.0–75.0)
NEUTROS ABS: 4 10*3/uL (ref 1.5–6.5)
PLATELETS: 251 10*3/uL (ref 140–400)
RBC: 4.06 10*6/uL — AB (ref 4.20–5.82)
RDW: 13.3 % (ref 11.0–14.6)
WBC: 6.7 10*3/uL (ref 4.0–10.3)

## 2016-12-30 DIAGNOSIS — M25511 Pain in right shoulder: Secondary | ICD-10-CM | POA: Diagnosis not present

## 2016-12-30 DIAGNOSIS — M25611 Stiffness of right shoulder, not elsewhere classified: Secondary | ICD-10-CM | POA: Diagnosis not present

## 2016-12-30 DIAGNOSIS — Z96611 Presence of right artificial shoulder joint: Secondary | ICD-10-CM | POA: Diagnosis not present

## 2016-12-30 LAB — KAPPA/LAMBDA LIGHT CHAINS
Ig Kappa Free Light Chain: 51.8 mg/L — ABNORMAL HIGH (ref 3.3–19.4)
Ig Lambda Free Light Chain: 11.7 mg/L (ref 5.7–26.3)
Kappa/Lambda FluidC Ratio: 4.43 — ABNORMAL HIGH (ref 0.26–1.65)

## 2017-01-02 ENCOUNTER — Other Ambulatory Visit: Payer: Self-pay | Admitting: Internal Medicine

## 2017-01-02 DIAGNOSIS — Z96611 Presence of right artificial shoulder joint: Secondary | ICD-10-CM | POA: Diagnosis not present

## 2017-01-02 DIAGNOSIS — M25511 Pain in right shoulder: Secondary | ICD-10-CM | POA: Diagnosis not present

## 2017-01-02 DIAGNOSIS — M25611 Stiffness of right shoulder, not elsewhere classified: Secondary | ICD-10-CM | POA: Diagnosis not present

## 2017-01-02 LAB — MULTIPLE MYELOMA PANEL, SERUM
ALPHA 1: 0.2 g/dL (ref 0.0–0.4)
ALPHA2 GLOB SERPL ELPH-MCNC: 0.5 g/dL (ref 0.4–1.0)
Albumin SerPl Elph-Mcnc: 3.6 g/dL (ref 2.9–4.4)
Albumin/Glob SerPl: 1.2 (ref 0.7–1.7)
B-GLOBULIN SERPL ELPH-MCNC: 1.1 g/dL (ref 0.7–1.3)
Gamma Glob SerPl Elph-Mcnc: 1.3 g/dL (ref 0.4–1.8)
Globulin, Total: 3.2 g/dL (ref 2.2–3.9)
IGM (IMMUNOGLOBIN M), SRM: 27 mg/dL (ref 20–172)
IgA, Qn, Serum: 295 mg/dL (ref 61–437)
M PROTEIN SERPL ELPH-MCNC: 0.7 g/dL — AB
TOTAL PROTEIN: 6.8 g/dL (ref 6.0–8.5)

## 2017-01-03 ENCOUNTER — Ambulatory Visit (INDEPENDENT_AMBULATORY_CARE_PROVIDER_SITE_OTHER): Payer: Medicare Other | Admitting: General Practice

## 2017-01-03 DIAGNOSIS — E538 Deficiency of other specified B group vitamins: Secondary | ICD-10-CM

## 2017-01-03 MED ORDER — CYANOCOBALAMIN 1000 MCG/ML IJ SOLN
1000.0000 ug | Freq: Once | INTRAMUSCULAR | Status: AC
  Administered 2017-01-03: 1000 ug via INTRAMUSCULAR

## 2017-01-04 DIAGNOSIS — M7021 Olecranon bursitis, right elbow: Secondary | ICD-10-CM | POA: Diagnosis not present

## 2017-01-04 DIAGNOSIS — M25611 Stiffness of right shoulder, not elsewhere classified: Secondary | ICD-10-CM | POA: Diagnosis not present

## 2017-01-04 DIAGNOSIS — M25511 Pain in right shoulder: Secondary | ICD-10-CM | POA: Diagnosis not present

## 2017-01-04 DIAGNOSIS — Z96611 Presence of right artificial shoulder joint: Secondary | ICD-10-CM | POA: Diagnosis not present

## 2017-01-05 ENCOUNTER — Ambulatory Visit (HOSPITAL_BASED_OUTPATIENT_CLINIC_OR_DEPARTMENT_OTHER): Payer: Medicare Other | Admitting: Hematology and Oncology

## 2017-01-05 DIAGNOSIS — E538 Deficiency of other specified B group vitamins: Secondary | ICD-10-CM

## 2017-01-05 DIAGNOSIS — M25511 Pain in right shoulder: Secondary | ICD-10-CM | POA: Diagnosis not present

## 2017-01-05 DIAGNOSIS — D472 Monoclonal gammopathy: Secondary | ICD-10-CM | POA: Diagnosis not present

## 2017-01-05 DIAGNOSIS — M25512 Pain in left shoulder: Secondary | ICD-10-CM

## 2017-01-06 ENCOUNTER — Encounter: Payer: Self-pay | Admitting: Hematology and Oncology

## 2017-01-06 NOTE — Assessment & Plan Note (Signed)
He was diagnosed with vitamin B12 deficiency He appears to have responded well to vitamin B12 injections through his primary care doctor I would defer to them for further management 

## 2017-01-06 NOTE — Progress Notes (Signed)
[facility] OFFICE PROGRESS NOTE  Patient Care Team: Biagio Borg, MD as PCP - General Clearnce Sorrel, MD as Attending Physician (Neurology) Earnie Larsson, MD (Neurosurgery) Heath Lark, MD as Consulting Physician (Hematology and Oncology)  SUMMARY OF ONCOLOGIC HISTORY:  This is a pleasant gentleman who was diagnosed with monoclonal gammopathy of unknown significance in 2013. The patient has history of anemia. Subsequent evaluation found that the patient had B12 deficiency. He was placed on B12 replacement therapy. 24-hour urine collection x-ray and blood work did not show evidence of end organ damage and he is being observed. He also has history of prostate cancer status post resection. His follow closely with his urologist.  INTERVAL HISTORY: Please see below for problem oriented charting. He returns for further follow-up He has significant joint pain requiring right shoulder replacement surgery He is undergoing rehabilitation He shared with me that after the joint surgery, he was put on multiple different medications and has been feeling unwell and have lost some weight Since discontinuation of those treatment, he is better and started to gain weight He told me that he also broke up with rash when he tried to resume some of his old medications including vitamin D He is getting vitamin B12 injection for vitamin B12 deficiency He denies recent infection  REVIEW OF SYSTEMS:   Constitutional: Denies fevers, chills or abnormal weight loss Eyes: Denies blurriness of vision Ears, nose, mouth, throat, and face: Denies mucositis or sore throat Respiratory: Denies cough, dyspnea or wheezes Cardiovascular: Denies palpitation, chest discomfort or lower extremity swelling Gastrointestinal:  Denies nausea, heartburn or change in bowel habits Skin: Denies abnormal skin rashes Lymphatics: Denies new lymphadenopathy or easy bruising Neurological:Denies numbness, tingling or new  weaknesses Behavioral/Psych: Mood is stable, no new changes  All other systems were reviewed with the patient and are negative.  I have reviewed the past medical history, past surgical history, social history and family history with the patient and they are unchanged from previous note.  ALLERGIES:  is allergic to no known allergies.  MEDICATIONS:  Current Outpatient Prescriptions  Medication Sig Dispense Refill  . allopurinol (ZYLOPRIM) 300 MG tablet TAKE 1 TABLET BY MOUTH  DAILY 90 tablet 1  . amLODipine (NORVASC) 5 MG tablet Take 1 tablet (5 mg total) by mouth daily. Please HOLD until follow-up with your PCP or SBP consistently above 135-140's 90 tablet 1  . Ascorbic Acid (VITAMIN C WITH ROSE HIPS) 1000 MG tablet Take 1,000 mg by mouth daily.    Marland Kitchen aspirin 81 MG chewable tablet Chew 1 tablet (81 mg total) by mouth daily.    Marland Kitchen atorvastatin (LIPITOR) 40 MG tablet TAKE 1 TABLET BY MOUTH  DAILY 90 tablet 1  . Cholecalciferol (VITAMIN D-3) 1000 units CAPS Take 2,000 Units by mouth daily.     . colchicine 0.6 MG tablet Take 1 tablet (0.6 mg total) by mouth 2 (two) times daily. For 5 days when having flare. 60 tablet 2  . Cyanocobalamin (B-12 COMPLIANCE INJECTION IJ) Inject 1 Dose as directed every 30 (thirty) days.    Marland Kitchen gabapentin (NEURONTIN) 300 MG capsule TAKE 2 CAPSULES BY MOUTH 3  TIMES DAILY 540 capsule 1  . ondansetron (ZOFRAN) 4 MG tablet Take 1 tablet (4 mg total) by mouth every 8 (eight) hours as needed for nausea or vomiting. 30 tablet 0  . oxyCODONE (ROXICODONE) 5 MG immediate release tablet Take 1-2 tablets (5-10 mg total) by mouth every 4 (four) hours as needed for  severe pain. 40 tablet 0  . PRESCRIPTION MEDICATION 1 Syringe by Intracavernosal route every other day as needed (10-30 minutes before you have sex for ED). Matrix (Papaverine 30mg  Phentolamine 1mg  Alprostadil 15mcg per ml injection)  erectile dysfunction injection every other day as needed for sex    . sennosides-docusate  sodium (SENOKOT-S) 8.6-50 MG tablet Take 2 tablets by mouth daily. 30 tablet 1  . tiZANidine (ZANAFLEX) 4 MG tablet TAKE 1 TABLET BY MOUTH 3  TIMES DAILY AS NEEDED FOR  MUSCLE SPASM(S) 270 tablet 1   No current facility-administered medications for this visit.     PHYSICAL EXAMINATION: ECOG PERFORMANCE STATUS: 1 - Symptomatic but completely ambulatory  Vitals:   01/05/17 1038  BP: 131/77  Pulse: (!) 59  Resp: 17  Temp: 98.3 F (36.8 C)  SpO2: 100%   Filed Weights   01/05/17 1038  Weight: 157 lb 9.6 oz (71.5 kg)    GENERAL:alert, no distress and comfortable SKIN: skin color, texture, turgor are normal, no rashes or significant lesions EYES: normal, Conjunctiva are pink and non-injected, sclera clear OROPHARYNX:no exudate, no erythema and lips, buccal mucosa, and tongue normal  NECK: supple, thyroid normal size, non-tender, without nodularity LYMPH:  no palpable lymphadenopathy in the cervical, axillary or inguinal LUNGS: clear to auscultation and percussion with normal breathing effort HEART: regular rate & rhythm and no murmurs and no lower extremity edema ABDOMEN:abdomen soft, non-tender and normal bowel sounds Musculoskeletal:no cyanosis of digits and no clubbing  NEURO: alert & oriented x 3 with fluent speech, no focal motor/sensory deficits  LABORATORY DATA:  I have reviewed the data as listed    Component Value Date/Time   NA 141 12/29/2016 1152   K 4.1 12/29/2016 1152   CL 109 10/26/2016 0439   CL 104 06/28/2012 1010   CO2 29 12/29/2016 1152   GLUCOSE 191 (H) 12/29/2016 1152   GLUCOSE 127 (H) 06/28/2012 1010   BUN 15.1 12/29/2016 1152   CREATININE 1.1 12/29/2016 1152   CALCIUM 9.7 12/29/2016 1152   PROT 7.4 12/29/2016 1152   PROT 6.8 12/29/2016 1152   ALBUMIN 3.9 12/29/2016 1152   AST 20 12/29/2016 1152   ALT 23 12/29/2016 1152   ALKPHOS 78 12/29/2016 1152   BILITOT 1.19 12/29/2016 1152   GFRNONAA >60 10/26/2016 0439   GFRAA >60 10/26/2016 0439    No  results found for: SPEP, UPEP  Lab Results  Component Value Date   WBC 6.7 12/29/2016   NEUTROABS 4.0 12/29/2016   HGB 13.6 12/29/2016   HCT 40.8 12/29/2016   MCV 100.5 (H) 12/29/2016   PLT 251 12/29/2016      Chemistry      Component Value Date/Time   NA 141 12/29/2016 1152   K 4.1 12/29/2016 1152   CL 109 10/26/2016 0439   CL 104 06/28/2012 1010   CO2 29 12/29/2016 1152   BUN 15.1 12/29/2016 1152   CREATININE 1.1 12/29/2016 1152      Component Value Date/Time   CALCIUM 9.7 12/29/2016 1152   ALKPHOS 78 12/29/2016 1152   AST 20 12/29/2016 1152   ALT 23 12/29/2016 1152   BILITOT 1.19 12/29/2016 1152      ASSESSMENT & PLAN:  MGUS (monoclonal gammopathy of unknown significance) Clinically, he has no signs of disease progression. Plan to see him next year in with repeat blood work, examination and blood work.  B12 deficiency He was diagnosed with vitamin B12 deficiency He appears to have responded well to vitamin  B12 injections through his primary care doctor I would defer to them for further management  Bilateral shoulder pain The patient has significant joint pain He had undergone recent shoulder surgery He broke up with a rash and bleeding vitamin D supplement as a cause of the rash I think it is unlikely In any case, he is undergoing rehabilitation I would defer to his orthopedic doctor for further management   No orders of the defined types were placed in this encounter.  All questions were answered. The patient knows to call the clinic with any problems, questions or concerns. No barriers to learning was detected. I spent 15 minutes counseling the patient face to face. The total time spent in the appointment was 20 minutes and more than 50% was on counseling and review of test results     Heath Lark, MD 01/06/2017 9:08 AM

## 2017-01-06 NOTE — Assessment & Plan Note (Signed)
Clinically, he has no signs of disease progression. Plan to see him next year in with repeat blood work, examination and blood work.

## 2017-01-06 NOTE — Assessment & Plan Note (Addendum)
The patient has significant joint pain He had undergone recent shoulder surgery He broke up with a rash and blamed the vitamin D supplement as a cause of the rash I think it is unlikely I recommend he resume vitamin D supplements if possible In any case, he is undergoing rehabilitation I would defer to his orthopedic doctor for further management

## 2017-01-10 DIAGNOSIS — M25511 Pain in right shoulder: Secondary | ICD-10-CM | POA: Diagnosis not present

## 2017-01-10 DIAGNOSIS — Z96611 Presence of right artificial shoulder joint: Secondary | ICD-10-CM | POA: Diagnosis not present

## 2017-01-10 DIAGNOSIS — M25611 Stiffness of right shoulder, not elsewhere classified: Secondary | ICD-10-CM | POA: Diagnosis not present

## 2017-01-12 DIAGNOSIS — Z96611 Presence of right artificial shoulder joint: Secondary | ICD-10-CM | POA: Diagnosis not present

## 2017-01-12 DIAGNOSIS — M25611 Stiffness of right shoulder, not elsewhere classified: Secondary | ICD-10-CM | POA: Diagnosis not present

## 2017-01-12 DIAGNOSIS — M25511 Pain in right shoulder: Secondary | ICD-10-CM | POA: Diagnosis not present

## 2017-01-16 DIAGNOSIS — M25611 Stiffness of right shoulder, not elsewhere classified: Secondary | ICD-10-CM | POA: Diagnosis not present

## 2017-01-16 DIAGNOSIS — Z96611 Presence of right artificial shoulder joint: Secondary | ICD-10-CM | POA: Diagnosis not present

## 2017-01-16 DIAGNOSIS — M25511 Pain in right shoulder: Secondary | ICD-10-CM | POA: Diagnosis not present

## 2017-01-18 DIAGNOSIS — M25511 Pain in right shoulder: Secondary | ICD-10-CM | POA: Diagnosis not present

## 2017-01-18 DIAGNOSIS — Z96611 Presence of right artificial shoulder joint: Secondary | ICD-10-CM | POA: Diagnosis not present

## 2017-01-18 DIAGNOSIS — M25611 Stiffness of right shoulder, not elsewhere classified: Secondary | ICD-10-CM | POA: Diagnosis not present

## 2017-01-24 DIAGNOSIS — Z96611 Presence of right artificial shoulder joint: Secondary | ICD-10-CM | POA: Diagnosis not present

## 2017-01-24 DIAGNOSIS — M25611 Stiffness of right shoulder, not elsewhere classified: Secondary | ICD-10-CM | POA: Diagnosis not present

## 2017-01-24 DIAGNOSIS — M25511 Pain in right shoulder: Secondary | ICD-10-CM | POA: Diagnosis not present

## 2017-01-30 DIAGNOSIS — Z96611 Presence of right artificial shoulder joint: Secondary | ICD-10-CM | POA: Diagnosis not present

## 2017-01-30 DIAGNOSIS — M25611 Stiffness of right shoulder, not elsewhere classified: Secondary | ICD-10-CM | POA: Diagnosis not present

## 2017-01-30 DIAGNOSIS — M25511 Pain in right shoulder: Secondary | ICD-10-CM | POA: Diagnosis not present

## 2017-02-02 ENCOUNTER — Ambulatory Visit (INDEPENDENT_AMBULATORY_CARE_PROVIDER_SITE_OTHER): Payer: Medicare Other | Admitting: General Practice

## 2017-02-02 DIAGNOSIS — Z96611 Presence of right artificial shoulder joint: Secondary | ICD-10-CM | POA: Diagnosis not present

## 2017-02-02 DIAGNOSIS — Z23 Encounter for immunization: Secondary | ICD-10-CM

## 2017-02-02 DIAGNOSIS — E538 Deficiency of other specified B group vitamins: Secondary | ICD-10-CM

## 2017-02-02 DIAGNOSIS — M25511 Pain in right shoulder: Secondary | ICD-10-CM | POA: Diagnosis not present

## 2017-02-02 DIAGNOSIS — M25611 Stiffness of right shoulder, not elsewhere classified: Secondary | ICD-10-CM | POA: Diagnosis not present

## 2017-02-02 MED ORDER — CYANOCOBALAMIN 1000 MCG/ML IJ SOLN
1000.0000 ug | Freq: Once | INTRAMUSCULAR | Status: AC
  Administered 2017-02-02: 1000 ug via INTRAMUSCULAR

## 2017-02-07 DIAGNOSIS — M25611 Stiffness of right shoulder, not elsewhere classified: Secondary | ICD-10-CM | POA: Diagnosis not present

## 2017-02-07 DIAGNOSIS — M25511 Pain in right shoulder: Secondary | ICD-10-CM | POA: Diagnosis not present

## 2017-02-07 DIAGNOSIS — Z96611 Presence of right artificial shoulder joint: Secondary | ICD-10-CM | POA: Diagnosis not present

## 2017-02-09 DIAGNOSIS — M25511 Pain in right shoulder: Secondary | ICD-10-CM | POA: Diagnosis not present

## 2017-02-09 DIAGNOSIS — M25611 Stiffness of right shoulder, not elsewhere classified: Secondary | ICD-10-CM | POA: Diagnosis not present

## 2017-02-09 DIAGNOSIS — Z96611 Presence of right artificial shoulder joint: Secondary | ICD-10-CM | POA: Diagnosis not present

## 2017-02-14 DIAGNOSIS — Z96611 Presence of right artificial shoulder joint: Secondary | ICD-10-CM | POA: Diagnosis not present

## 2017-02-14 DIAGNOSIS — M25611 Stiffness of right shoulder, not elsewhere classified: Secondary | ICD-10-CM | POA: Diagnosis not present

## 2017-02-14 DIAGNOSIS — M25511 Pain in right shoulder: Secondary | ICD-10-CM | POA: Diagnosis not present

## 2017-02-16 DIAGNOSIS — M25611 Stiffness of right shoulder, not elsewhere classified: Secondary | ICD-10-CM | POA: Diagnosis not present

## 2017-02-16 DIAGNOSIS — M25511 Pain in right shoulder: Secondary | ICD-10-CM | POA: Diagnosis not present

## 2017-02-16 DIAGNOSIS — Z96611 Presence of right artificial shoulder joint: Secondary | ICD-10-CM | POA: Diagnosis not present

## 2017-02-20 ENCOUNTER — Other Ambulatory Visit: Payer: Self-pay | Admitting: Family Medicine

## 2017-02-20 NOTE — Telephone Encounter (Signed)
Refill done.  

## 2017-02-21 DIAGNOSIS — M25611 Stiffness of right shoulder, not elsewhere classified: Secondary | ICD-10-CM | POA: Diagnosis not present

## 2017-02-21 DIAGNOSIS — Z96611 Presence of right artificial shoulder joint: Secondary | ICD-10-CM | POA: Diagnosis not present

## 2017-02-21 DIAGNOSIS — M25511 Pain in right shoulder: Secondary | ICD-10-CM | POA: Diagnosis not present

## 2017-03-01 DIAGNOSIS — M25611 Stiffness of right shoulder, not elsewhere classified: Secondary | ICD-10-CM | POA: Diagnosis not present

## 2017-03-01 DIAGNOSIS — Z96611 Presence of right artificial shoulder joint: Secondary | ICD-10-CM | POA: Diagnosis not present

## 2017-03-01 DIAGNOSIS — M25511 Pain in right shoulder: Secondary | ICD-10-CM | POA: Diagnosis not present

## 2017-03-02 ENCOUNTER — Other Ambulatory Visit: Payer: Self-pay | Admitting: Family Medicine

## 2017-03-07 ENCOUNTER — Ambulatory Visit (INDEPENDENT_AMBULATORY_CARE_PROVIDER_SITE_OTHER): Payer: Medicare Other

## 2017-03-07 DIAGNOSIS — E538 Deficiency of other specified B group vitamins: Secondary | ICD-10-CM | POA: Diagnosis not present

## 2017-03-07 DIAGNOSIS — M25511 Pain in right shoulder: Secondary | ICD-10-CM | POA: Diagnosis not present

## 2017-03-07 DIAGNOSIS — Z96611 Presence of right artificial shoulder joint: Secondary | ICD-10-CM | POA: Diagnosis not present

## 2017-03-07 DIAGNOSIS — M25611 Stiffness of right shoulder, not elsewhere classified: Secondary | ICD-10-CM | POA: Diagnosis not present

## 2017-03-07 MED ORDER — CYANOCOBALAMIN 1000 MCG/ML IJ SOLN
1000.0000 ug | Freq: Once | INTRAMUSCULAR | Status: AC
  Administered 2017-03-07: 1000 ug via INTRAMUSCULAR

## 2017-03-07 NOTE — Progress Notes (Signed)
Medical screening examination/treatment/procedure(s) were performed by non-physician practitioner and as supervising physician I was immediately available for consultation/collaboration. I agree with above. Riordan Walle, MD   

## 2017-03-09 DIAGNOSIS — M25511 Pain in right shoulder: Secondary | ICD-10-CM | POA: Diagnosis not present

## 2017-03-09 DIAGNOSIS — M25611 Stiffness of right shoulder, not elsewhere classified: Secondary | ICD-10-CM | POA: Diagnosis not present

## 2017-03-09 DIAGNOSIS — Z96611 Presence of right artificial shoulder joint: Secondary | ICD-10-CM | POA: Diagnosis not present

## 2017-03-13 DIAGNOSIS — M25511 Pain in right shoulder: Secondary | ICD-10-CM | POA: Diagnosis not present

## 2017-03-13 DIAGNOSIS — M25512 Pain in left shoulder: Secondary | ICD-10-CM | POA: Diagnosis not present

## 2017-03-13 DIAGNOSIS — M542 Cervicalgia: Secondary | ICD-10-CM | POA: Diagnosis not present

## 2017-03-13 DIAGNOSIS — M25532 Pain in left wrist: Secondary | ICD-10-CM | POA: Diagnosis not present

## 2017-03-29 DIAGNOSIS — M25511 Pain in right shoulder: Secondary | ICD-10-CM | POA: Diagnosis not present

## 2017-04-06 ENCOUNTER — Ambulatory Visit (INDEPENDENT_AMBULATORY_CARE_PROVIDER_SITE_OTHER): Payer: Medicare Other | Admitting: *Deleted

## 2017-04-06 DIAGNOSIS — E538 Deficiency of other specified B group vitamins: Secondary | ICD-10-CM | POA: Diagnosis not present

## 2017-04-06 MED ORDER — CYANOCOBALAMIN 1000 MCG/ML IJ SOLN
1000.0000 ug | Freq: Once | INTRAMUSCULAR | Status: AC
  Administered 2017-04-06: 1000 ug via INTRAMUSCULAR

## 2017-04-19 DIAGNOSIS — C61 Malignant neoplasm of prostate: Secondary | ICD-10-CM | POA: Diagnosis not present

## 2017-04-26 DIAGNOSIS — N393 Stress incontinence (female) (male): Secondary | ICD-10-CM | POA: Diagnosis not present

## 2017-04-26 DIAGNOSIS — C61 Malignant neoplasm of prostate: Secondary | ICD-10-CM | POA: Diagnosis not present

## 2017-04-26 DIAGNOSIS — N5201 Erectile dysfunction due to arterial insufficiency: Secondary | ICD-10-CM | POA: Diagnosis not present

## 2017-05-03 ENCOUNTER — Telehealth: Payer: Self-pay | Admitting: Internal Medicine

## 2017-05-03 MED ORDER — ATORVASTATIN CALCIUM 40 MG PO TABS: 40.0000 mg | ORAL_TABLET | Freq: Every day | ORAL | 0 refills | Status: DC

## 2017-05-03 MED ORDER — ALLOPURINOL 300 MG PO TABS: 300.0000 mg | ORAL_TABLET | Freq: Every day | ORAL | 0 refills | Status: DC

## 2017-05-03 MED ORDER — GABAPENTIN 300 MG PO CAPS: ORAL_CAPSULE | ORAL | 0 refills | Status: DC

## 2017-05-03 MED ORDER — AMLODIPINE BESYLATE 5 MG PO TABS: 5.0000 mg | ORAL_TABLET | Freq: Every day | ORAL | 0 refills | Status: DC

## 2017-05-03 NOTE — Telephone Encounter (Signed)
Denied refills to [facility] until pt comes in for appt on 1/29/*19. Notified pt can send 30 day script to his local pharmacy until his appt, and then MD can send 90 day to mail service...Johny Chess

## 2017-05-03 NOTE — Telephone Encounter (Signed)
Last OV with Dr. Jenny Reichmann 03/22/16.  Next appt is 05/08/17.  Been over a year since he was seen.

## 2017-05-03 NOTE — Addendum Note (Signed)
Addended by: Earnstine Regal on: 05/03/2017 01:40 PM   Modules accepted: Orders

## 2017-05-03 NOTE — Telephone Encounter (Signed)
Copied from [place] [id number]. Topic: Quick Communication - See Telephone Encounter >> May 03, 2017 12:11 PM Bea Graff, NT wrote: CRM for notification. See Telephone encounter for: [facility] Mail Order service calling with pt on the phone to request refills and that these medications be sent to them in order to mail to pt. Amlodipine 5mg , allopurinol 300mg , gabapentin 300mg , atorvastatin 40mg , and tizanidine 4mg . [employer] call back #: [phone]  05/03/17.

## 2017-05-04 ENCOUNTER — Telehealth: Payer: Self-pay | Admitting: Internal Medicine

## 2017-05-04 ENCOUNTER — Telehealth: Payer: Self-pay

## 2017-05-04 MED ORDER — VITAMIN D-3 25 MCG (1000 UT) PO CAPS: 2000.0000 [IU] | ORAL_CAPSULE | Freq: Every day | ORAL | 0 refills | Status: DC

## 2017-05-04 MED ORDER — VITAMIN C-ROSE HIPS 1000 MG PO TABS: 1000.0000 mg | ORAL_TABLET | Freq: Every day | ORAL | 0 refills | Status: AC

## 2017-05-04 NOTE — Telephone Encounter (Signed)
Patient has not been seen since 2017. Please schedule for a physical before medication can be sent in. Thanks!

## 2017-05-04 NOTE — Telephone Encounter (Addendum)
Contacted pt regarding the need to schedule an appointment; initiated conference call with Sam at [facility] assisted with answering the pt's questions regarding medication refills and transfer; pt offered and accepted appointment with Dr Cathlean Cower on 05/18/17 at 1340; pt verbalizes understanding.

## 2017-05-04 NOTE — Telephone Encounter (Signed)
Done

## 2017-05-04 NOTE — Telephone Encounter (Signed)
Pt has made an appointment for his CPE for 05-18-2017, patient would like his meds recent for a 90 days supply so they are not a cost to him, please advise Patient would also like to be Rx  Vit C 600mg  1x per day Vit D 600mg  1x per day So he does not have to continue buying these OTC Please advise

## 2017-05-04 NOTE — Telephone Encounter (Signed)
Copied from [place]. Topic: General - Other >> May 04, 2017  9:47 AM Carolyn Stare wrote:  Pt was informed by  [facility] mail order they can fill his [place]  [phone]   EXT [id number]

## 2017-05-04 NOTE — Addendum Note (Signed)
Addended by: Juliet Rude on: 05/04/2017 10:45 AM   Modules accepted: Orders

## 2017-05-08 ENCOUNTER — Ambulatory Visit (INDEPENDENT_AMBULATORY_CARE_PROVIDER_SITE_OTHER): Payer: Medicare HMO

## 2017-05-08 DIAGNOSIS — E538 Deficiency of other specified B group vitamins: Secondary | ICD-10-CM | POA: Diagnosis not present

## 2017-05-08 MED ORDER — CYANOCOBALAMIN 1000 MCG/ML IJ SOLN
1000.0000 ug | Freq: Once | INTRAMUSCULAR | Status: AC
  Administered 2017-05-08: 1000 ug via INTRAMUSCULAR

## 2017-05-10 DIAGNOSIS — M25511 Pain in right shoulder: Secondary | ICD-10-CM | POA: Diagnosis not present

## 2017-05-10 DIAGNOSIS — M25512 Pain in left shoulder: Secondary | ICD-10-CM | POA: Diagnosis not present

## 2017-05-10 DIAGNOSIS — M25532 Pain in left wrist: Secondary | ICD-10-CM | POA: Diagnosis not present

## 2017-05-16 DIAGNOSIS — M19039 Primary osteoarthritis, unspecified wrist: Secondary | ICD-10-CM | POA: Insufficient documentation

## 2017-05-16 DIAGNOSIS — M25332 Other instability, left wrist: Secondary | ICD-10-CM | POA: Diagnosis not present

## 2017-05-16 DIAGNOSIS — M19032 Primary osteoarthritis, left wrist: Secondary | ICD-10-CM | POA: Diagnosis not present

## 2017-05-18 ENCOUNTER — Ambulatory Visit (INDEPENDENT_AMBULATORY_CARE_PROVIDER_SITE_OTHER): Payer: Medicare HMO | Admitting: Internal Medicine

## 2017-05-18 ENCOUNTER — Other Ambulatory Visit (INDEPENDENT_AMBULATORY_CARE_PROVIDER_SITE_OTHER): Payer: Medicare HMO

## 2017-05-18 ENCOUNTER — Encounter: Payer: Self-pay | Admitting: Internal Medicine

## 2017-05-18 VITALS — BP 102/70 | HR 74 | Temp 98.5°F | Ht 71.5 in | Wt 158.0 lb

## 2017-05-18 DIAGNOSIS — Z23 Encounter for immunization: Secondary | ICD-10-CM | POA: Diagnosis not present

## 2017-05-18 DIAGNOSIS — R7302 Impaired glucose tolerance (oral): Secondary | ICD-10-CM | POA: Diagnosis not present

## 2017-05-18 DIAGNOSIS — E785 Hyperlipidemia, unspecified: Secondary | ICD-10-CM

## 2017-05-18 DIAGNOSIS — I10 Essential (primary) hypertension: Secondary | ICD-10-CM

## 2017-05-18 DIAGNOSIS — Z0000 Encounter for general adult medical examination without abnormal findings: Secondary | ICD-10-CM

## 2017-05-18 LAB — LIPID PANEL
CHOL/HDL RATIO: 3
Cholesterol: 122 mg/dL (ref 0–200)
HDL: 40.4 mg/dL (ref >39.00)
LDL CALC: 55 mg/dL (ref 0–99)
NONHDL: 81.83
Triglycerides: 133 mg/dL (ref 0.0–149.0)
VLDL: 26.6 mg/dL (ref 0.0–40.0)

## 2017-05-18 LAB — HEMOGLOBIN A1C: HEMOGLOBIN A1C: 6.1 % (ref 4.6–6.5)

## 2017-05-18 LAB — TSH: TSH: 1.85 u[IU]/mL (ref 0.35–4.50)

## 2017-05-18 MED ORDER — ZOSTER VAC RECOMB ADJUVANTED 50 MCG/0.5ML IM SUSR: 0.5000 mL | Freq: Once | INTRAMUSCULAR | 1 refills | Status: AC

## 2017-05-18 NOTE — Patient Instructions (Addendum)
You had the Prevnar pneumonia shot today  Your shingles shot was sent to the pharmacy  Please continue all other medications as before, and refills have been done if requested.  Please have the pharmacy call with any other refills you may need.  Please continue your efforts at being more active, low cholesterol diet, and weight control.  You are otherwise up to date with prevention measures today.  Please keep your appointments with your specialists as you may have planned  Please go to the LAB in the Basement (turn left off the elevator) for the tests to be done today  You will be contacted by phone if any changes need to be made immediately.  Otherwise, you will receive a letter about your results with an explanation, but please check with MyChart first.  Please remember to sign up for MyChart if you have not done so, as this will be important to you in the future with finding out test results, communicating by private email, and scheduling acute appointments online when needed.  Please return in 1 year for your yearly visit, or sooner if needed

## 2017-05-18 NOTE — Progress Notes (Signed)
Subjective:    Patient ID: Nathan Wallace, male    DOB: [birth year], 67 y.o.   MRN: [id number]  HPI  Here for wellness and f/u;  Overall doing ok;  Pt denies Chest pain, worsening SOB, DOE, wheezing, orthopnea, PND, worsening LE edema, palpitations, dizziness or syncope.  Pt denies neurological change such as new headache, facial or extremity weakness.  Pt denies polydipsia, polyuria, or low sugar symptoms. Pt states overall good compliance with treatment and medications, good tolerability, and has been trying to follow appropriate diet.  Pt denies worsening depressive symptoms, suicidal ideation or panic. No fever, night sweats, wt loss, loss of appetite, or other constitutional symptoms.  Pt states good ability with ADL's, has low fall risk, home safety reviewed and adequate, no other significant changes in hearing or vision, and only occasionally active with exercise.  S/p right shoulder surgury, conts to heal per pt, has f/u later this month.  Seeing hand surgury for left wrist arthritis, s/p cortison with f/u at 2 wks.  Due for f/u heme in sept 2019  Follows with urology for prostate ca, had psa and UA last month.  No other interval hx or new complaints Past Medical History:  Diagnosis Date  . AKI (acute kidney injury) ([place])   . Arthritis    RT KNEE AND RT SHOULDER  . B12 deficiency   . Benign head tremor   . Chest tightness    comes and goes   . CVA (cerebrovascular accident due to intracerebral hemorrhage) ([place]) 08/03/2011  . Degenerative joint disease of knee, right   . Depression 07/26/2012   no problems now  . GERD (gastroesophageal reflux disease)    NO RECENT PROBLEMS AND NO MEDS  . History of blood transfusion   . History of kidney stones   . History of urinary tract infection   . Hyperlipidemia    border line  . Hypertension   . Impaired glucose tolerance 06/22/2011  . Left inguinal hernia   . MGUS (monoclonal gammopathy of unknown significance)   . Prostate cancer ([place]) 2011   . Rotator cuff tear arthropathy of right shoulder, with instability 10/25/2016  . Sepsis ([place])   . Stress incontinence   . Stroke [facility])    2013  . Syncope 2012   after prostate surgery; neg neurology work up.    Past Surgical History:  Procedure Laterality Date  . COLONOSCOPY    . INGUINAL HERNIA REPAIR  06/01/2011   Procedure: HERNIA REPAIR INGUINAL ADULT;  Surgeon: Adin Hector, MD;  Location: [facility];  Service: General;  Laterality: Left;  Left Inguinal Hernia Repair with Mesh  . ROBOT ASSISTED LAPAROSCOPIC RADICAL PROSTATECTOMY  jan 2012   da vinci  . SPINE SURGERY  2003 OR 2004   CERVICAL FUSION-STATES LIMITED ROM NECK  . TOTAL SHOULDER ARTHROPLASTY Right 10/25/2016  . TOTAL SHOULDER ARTHROPLASTY Right 10/25/2016   Procedure: RIGHT TOTAL SHOULDER ARTHROPLASTY;  Surgeon: Marchia Bond, MD;  Location: [place];  Service: Orthopedics;  Laterality: Right;  . URETHRAL SLING N/A 03/08/2016   Procedure: MALE SLING CYSTOSCOPY;  Surgeon: Bjorn Loser, MD;  Location: [facility];  Service: Urology;  Laterality: N/A;  . WRIST SURGERY     right     reports that he quit smoking about 50 years ago. His smoking use included cigarettes. He has a 0.50 pack-year smoking history. he has never used smokeless tobacco. He reports that he drinks alcohol. He reports that he uses drugs. Drug: Marijuana. family history  includes Aneurysm in his mother; Cancer in his father; Colon cancer in his father; Heart attack in his brother; Hypertension in his mother. Allergies  Allergen Reactions  . No Known Allergies    Current Outpatient Medications on File Prior to Visit  Medication Sig Dispense Refill  . allopurinol (ZYLOPRIM) 300 MG tablet Take 1 tablet (300 mg total) by mouth daily. Must keep appt for future refills 30 tablet 0  . amLODipine (NORVASC) 5 MG tablet Take 1 tablet (5 mg total) by mouth daily. Must keep appt for future refills 30 tablet 0  . Ascorbic Acid (VITAMIN C WITH ROSE HIPS) 1000 MG tablet  Take 1 tablet (1,000 mg total) by mouth daily. 90 tablet 0  . aspirin 81 MG chewable tablet Chew 1 tablet (81 mg total) by mouth daily.    Marland Kitchen atorvastatin (LIPITOR) 40 MG tablet Take 1 tablet (40 mg total) by mouth daily. Must keep appt for future refills 30 tablet 0  . Cholecalciferol (VITAMIN D-3) 1000 units CAPS Take 2 capsules (2,000 Units total) by mouth daily. 180 capsule 0  . colchicine 0.6 MG tablet Take 1 tablet (0.6 mg total) by mouth 2 (two) times daily. For 5 days when having flare. 60 tablet 2  . Cyanocobalamin (B-12 COMPLIANCE INJECTION IJ) Inject 1 Dose as directed every 30 (thirty) days.    Marland Kitchen gabapentin (NEURONTIN) 300 MG capsule TAKE 2 CAPSULES BY MOUTH 3  TIMES DAILY 180 capsule 0  . ondansetron (ZOFRAN) 4 MG tablet Take 1 tablet (4 mg total) by mouth every 8 (eight) hours as needed for nausea or vomiting. 30 tablet 0  . oxyCODONE (ROXICODONE) 5 MG immediate release tablet Take 1-2 tablets (5-10 mg total) by mouth every 4 (four) hours as needed for severe pain. 40 tablet 0  . PRESCRIPTION MEDICATION 1 Syringe by Intracavernosal route every other day as needed (10-30 minutes before you have sex for ED). Matrix (Papaverine 30mg  Phentolamine 1mg  Alprostadil 63mcg per ml injection)  erectile dysfunction injection every other day as needed for sex    . sennosides-docusate sodium (SENOKOT-S) 8.6-50 MG tablet Take 2 tablets by mouth daily. 30 tablet 1  . tiZANidine (ZANAFLEX) 4 MG tablet TAKE 1 TABLET BY MOUTH 3  TIMES DAILY AS NEEDED FOR  MUSCLE SPASM(S) 270 tablet 1   No current facility-administered medications on file prior to visit.    Review of Systems Constitutional: Negative for other unusual diaphoresis, sweats, appetite or weight changes HENT: Negative for other worsening hearing loss, ear pain, facial swelling, mouth sores or neck stiffness.   Eyes: Negative for other worsening pain, redness or other visual disturbance.  Respiratory: Negative for other stridor or  swelling Cardiovascular: Negative for other palpitations or other chest pain  Gastrointestinal: Negative for worsening diarrhea or loose stools, blood in stool, distention or other pain Genitourinary: Negative for hematuria, flank pain or other change in urine volume.  Musculoskeletal: Negative for myalgias or other joint swelling.  Skin: Negative for other color change, or other wound or worsening drainage.  Neurological: Negative for other syncope or numbness. Hematological: Negative for other adenopathy or swelling Psychiatric/Behavioral: Negative for hallucinations, other worsening agitation, SI, self-injury, or new decreased concentration All other system neg per pt    Objective:   Physical Exam BP 102/70   Pulse 74   Temp 98.5 F (36.9 C) (Oral)   Ht 5' 11.5" (1.816 m)   Wt 158 lb (71.7 kg)   SpO2 96%   BMI 21.73 kg/m  \VS  noted,  Constitutional: Pt is oriented to person, place, and time. Appears well-developed and well-nourished, in no significant distress and comfortable Head: Normocephalic and atraumatic  Eyes: Conjunctivae and EOM are normal. Pupils are equal, round, and reactive to light Right Ear: External ear normal without discharge Left Ear: External ear normal without discharge Nose: Nose without discharge or deformity Mouth/Throat: Oropharynx is without other ulcerations and moist  Neck: Normal range of motion. Neck supple. No JVD present. No tracheal deviation present or significant neck LA or mass Cardiovascular: Normal rate, regular rhythm, normal heart sounds and intact distal pulses.   Pulmonary/Chest: WOB normal and breath sounds without rales or wheezing  Abdominal: Soft. Bowel sounds are normal. NT. No HSM  Musculoskeletal: Normal range of motion. Exhibits no edema Lymphadenopathy: Has no other cervical adenopathy.  Neurological: Pt is alert and oriented to person, place, and time. Pt has normal reflexes. No cranial nerve deficit. Motor grossly intact,  Gait intact Skin: Skin is warm and dry. No rash noted or new ulcerations Psychiatric:  Has normal mood and affect. Behavior is normal without agitation No other exam findings       Assessment & Plan:

## 2017-05-20 NOTE — Assessment & Plan Note (Signed)

## 2017-05-20 NOTE — Assessment & Plan Note (Signed)
BP Readings from Last 3 Encounters:  05/18/17 102/70  01/05/17 131/77  10/28/16 122/66  stable overall by history and exam, recent data reviewed with pt, and pt to continue medical treatment as before,  to f/u any worsening symptoms or concerns

## 2017-05-20 NOTE — Assessment & Plan Note (Signed)
Lab Results  Component Value Date   HGBA1C 6.1 05/18/2017  stable overall by history and exam, recent data reviewed with pt, and pt to continue medical treatment as before,  to f/u any worsening symptoms or concerns

## 2017-05-20 NOTE — Assessment & Plan Note (Signed)
Lab Results  Component Value Date   LDLCALC 55 05/18/2017  stable overall by history and exam, recent data reviewed with pt, and pt to continue medical treatment as before,  to f/u any worsening symptoms or concerns

## 2017-06-05 ENCOUNTER — Other Ambulatory Visit: Payer: Self-pay

## 2017-06-05 MED ORDER — ATORVASTATIN CALCIUM 40 MG PO TABS: 40.0000 mg | ORAL_TABLET | Freq: Every day | ORAL | 3 refills | Status: DC

## 2017-06-05 MED ORDER — GABAPENTIN 300 MG PO CAPS: ORAL_CAPSULE | ORAL | 3 refills | Status: DC

## 2017-06-05 MED ORDER — ALLOPURINOL 300 MG PO TABS: 300.0000 mg | ORAL_TABLET | Freq: Every day | ORAL | 3 refills | Status: DC

## 2017-06-06 ENCOUNTER — Other Ambulatory Visit: Payer: Self-pay | Admitting: Internal Medicine

## 2017-06-07 ENCOUNTER — Other Ambulatory Visit: Payer: Self-pay | Admitting: Internal Medicine

## 2017-06-08 ENCOUNTER — Ambulatory Visit (INDEPENDENT_AMBULATORY_CARE_PROVIDER_SITE_OTHER): Payer: Medicare HMO | Admitting: *Deleted

## 2017-06-08 DIAGNOSIS — E538 Deficiency of other specified B group vitamins: Secondary | ICD-10-CM | POA: Diagnosis not present

## 2017-06-08 MED ORDER — CYANOCOBALAMIN 1000 MCG/ML IJ SOLN
1000.0000 ug | Freq: Once | INTRAMUSCULAR | Status: AC
Start: 2017-06-08 — End: 2017-06-08
  Administered 2017-06-08: 1000 ug via INTRAMUSCULAR

## 2017-06-13 ENCOUNTER — Other Ambulatory Visit: Payer: Self-pay

## 2017-06-13 ENCOUNTER — Telehealth: Payer: Self-pay | Admitting: Internal Medicine

## 2017-06-13 DIAGNOSIS — M25332 Other instability, left wrist: Secondary | ICD-10-CM | POA: Diagnosis not present

## 2017-06-13 DIAGNOSIS — M19039 Primary osteoarthritis, unspecified wrist: Secondary | ICD-10-CM | POA: Diagnosis not present

## 2017-06-13 MED ORDER — AMLODIPINE BESYLATE 5 MG PO TABS: 5.0000 mg | ORAL_TABLET | Freq: Every day | ORAL | 0 refills | Status: DC

## 2017-06-13 NOTE — Telephone Encounter (Signed)
Copied from [place] [phone]. Topic: General - Other >> Jun 13, 2017 11:38 AM Darl Householder, RMA wrote: Reason for CRM: Medication refill request for amLODipine (NORVASC) 5 MG to be sent to [facility], pt is completely out of medication

## 2017-07-06 ENCOUNTER — Ambulatory Visit (INDEPENDENT_AMBULATORY_CARE_PROVIDER_SITE_OTHER): Payer: Medicare HMO | Admitting: *Deleted

## 2017-07-06 DIAGNOSIS — E538 Deficiency of other specified B group vitamins: Secondary | ICD-10-CM | POA: Diagnosis not present

## 2017-07-06 MED ORDER — CYANOCOBALAMIN 1000 MCG/ML IJ SOLN
1000.0000 ug | Freq: Once | INTRAMUSCULAR | Status: AC
  Administered 2017-07-06: 1000 ug via INTRAMUSCULAR

## 2017-07-10 ENCOUNTER — Other Ambulatory Visit: Payer: Self-pay | Admitting: Internal Medicine

## 2017-08-07 ENCOUNTER — Ambulatory Visit (INDEPENDENT_AMBULATORY_CARE_PROVIDER_SITE_OTHER): Payer: Medicare HMO

## 2017-08-07 DIAGNOSIS — E538 Deficiency of other specified B group vitamins: Secondary | ICD-10-CM

## 2017-08-07 MED ORDER — CYANOCOBALAMIN 1000 MCG/ML IJ SOLN
1000.0000 ug | Freq: Once | INTRAMUSCULAR | Status: AC
  Administered 2017-08-07: 1000 ug via INTRAMUSCULAR

## 2017-08-07 NOTE — Progress Notes (Signed)
Medical screening examination/treatment/procedure(s) were performed by non-physician practitioner and as supervising physician I was immediately available for consultation/collaboration. I agree with above. Kerissa Coia, MD   

## 2017-09-06 ENCOUNTER — Ambulatory Visit (INDEPENDENT_AMBULATORY_CARE_PROVIDER_SITE_OTHER): Payer: Medicare HMO

## 2017-09-06 DIAGNOSIS — E538 Deficiency of other specified B group vitamins: Secondary | ICD-10-CM

## 2017-09-06 MED ORDER — CYANOCOBALAMIN 1000 MCG/ML IJ SOLN
1000.0000 ug | Freq: Once | INTRAMUSCULAR | Status: AC
  Administered 2017-09-06: 1000 ug via INTRAMUSCULAR

## 2017-09-06 NOTE — Progress Notes (Signed)
Medical screening examination/treatment/procedure(s) were performed by non-physician practitioner and as supervising physician I was immediately available for consultation/collaboration. I agree with above. James John, MD   

## 2017-09-14 DIAGNOSIS — M25332 Other instability, left wrist: Secondary | ICD-10-CM | POA: Diagnosis not present

## 2017-09-23 ENCOUNTER — Other Ambulatory Visit: Payer: Self-pay | Admitting: Internal Medicine

## 2017-10-05 ENCOUNTER — Ambulatory Visit (INDEPENDENT_AMBULATORY_CARE_PROVIDER_SITE_OTHER): Payer: 59

## 2017-10-05 DIAGNOSIS — E538 Deficiency of other specified B group vitamins: Secondary | ICD-10-CM | POA: Diagnosis not present

## 2017-10-05 MED ORDER — CYANOCOBALAMIN 1000 MCG/ML IJ SOLN
1000.0000 ug | Freq: Once | INTRAMUSCULAR | Status: AC
  Administered 2017-10-05: 1000 ug via INTRAMUSCULAR

## 2017-10-05 NOTE — Progress Notes (Signed)
Medical screening examination/treatment/procedure(s) were performed by non-physician practitioner and as supervising physician I was immediately available for consultation/collaboration. I agree with above. James John, MD   

## 2017-10-06 ENCOUNTER — Ambulatory Visit: Payer: Medicare HMO

## 2017-10-25 DIAGNOSIS — C61 Malignant neoplasm of prostate: Secondary | ICD-10-CM | POA: Diagnosis not present

## 2017-11-06 ENCOUNTER — Ambulatory Visit (INDEPENDENT_AMBULATORY_CARE_PROVIDER_SITE_OTHER): Payer: Medicare HMO | Admitting: *Deleted

## 2017-11-06 DIAGNOSIS — E538 Deficiency of other specified B group vitamins: Secondary | ICD-10-CM | POA: Diagnosis not present

## 2017-11-06 MED ORDER — CYANOCOBALAMIN 1000 MCG/ML IJ SOLN
1000.0000 ug | Freq: Once | INTRAMUSCULAR | Status: AC
  Administered 2017-11-06: 1000 ug via INTRAMUSCULAR

## 2017-11-06 NOTE — Progress Notes (Signed)
Medical screening examination/treatment/procedure(s) were performed by non-physician practitioner and as supervising physician I was immediately available for consultation/collaboration. I agree with above. James John, MD   

## 2017-11-23 ENCOUNTER — Telehealth: Payer: Self-pay | Admitting: Hematology and Oncology

## 2017-11-23 NOTE — Telephone Encounter (Signed)
Tried to call regarding 9/30 call could not be completed at the time

## 2017-12-08 ENCOUNTER — Ambulatory Visit: Payer: Medicare HMO | Admitting: Emergency Medicine

## 2017-12-08 DIAGNOSIS — E538 Deficiency of other specified B group vitamins: Secondary | ICD-10-CM

## 2017-12-08 MED ORDER — CYANOCOBALAMIN 1000 MCG/ML IJ SOLN: 1000.0000 ug | Freq: Once | INTRAMUSCULAR | Status: DC

## 2017-12-08 NOTE — Progress Notes (Signed)
Medical screening examination/treatment/procedure(s) were performed by non-physician practitioner and as supervising physician I was immediately available for consultation/collaboration. I agree with above. Johanny Segers, MD   

## 2018-01-04 ENCOUNTER — Other Ambulatory Visit: Payer: Self-pay | Admitting: Hematology and Oncology

## 2018-01-04 ENCOUNTER — Telehealth: Payer: Self-pay | Admitting: Hematology and Oncology

## 2018-01-04 ENCOUNTER — Telehealth: Payer: Self-pay

## 2018-01-04 DIAGNOSIS — D472 Monoclonal gammopathy: Secondary | ICD-10-CM

## 2018-01-04 NOTE — Telephone Encounter (Signed)
Called and given below message. Scheduling message sent for appt with Dr. Alvy Bimler on 10/7.

## 2018-01-04 NOTE — Telephone Encounter (Signed)
R/s appt per  9/26 sch message - pt is aware of appt date and time.

## 2018-01-04 NOTE — Telephone Encounter (Signed)
-----   Message from Heath Lark, MD sent at 01/04/2018 10:27 AM EDT ----- Regarding: labs and appt For MGUS patient, he should have labs done 1 week before I see him For some reason, labs and appt on Monday are scheduled together It is not helpful to see him the same day since I won't have the labs back. Can you call him and ask if he could see me the following week? If so, please reschedule his appt

## 2018-01-05 ENCOUNTER — Ambulatory Visit (INDEPENDENT_AMBULATORY_CARE_PROVIDER_SITE_OTHER): Payer: Medicare HMO

## 2018-01-05 DIAGNOSIS — E538 Deficiency of other specified B group vitamins: Secondary | ICD-10-CM

## 2018-01-05 DIAGNOSIS — Z23 Encounter for immunization: Secondary | ICD-10-CM | POA: Diagnosis not present

## 2018-01-05 MED ORDER — CYANOCOBALAMIN 1000 MCG/ML IJ SOLN
1000.0000 ug | Freq: Once | INTRAMUSCULAR | Status: AC
  Administered 2018-01-05: 1000 ug via INTRAMUSCULAR

## 2018-01-05 NOTE — Progress Notes (Signed)
Medical screening examination/treatment/procedure(s) were performed by non-physician practitioner and as supervising physician I was immediately available for consultation/collaboration. I agree with above. Taiyo Kozma, MD   

## 2018-01-08 ENCOUNTER — Inpatient Hospital Stay: Payer: Medicare HMO | Admitting: Hematology and Oncology

## 2018-01-08 ENCOUNTER — Inpatient Hospital Stay: Payer: Medicare HMO | Attending: Hematology and Oncology

## 2018-01-08 DIAGNOSIS — D472 Monoclonal gammopathy: Secondary | ICD-10-CM | POA: Insufficient documentation

## 2018-01-08 LAB — COMPREHENSIVE METABOLIC PANEL
ALBUMIN: 4.3 g/dL (ref 3.5–5.0)
ALT: 30 U/L (ref 0–44)
ANION GAP: 7 (ref 5–15)
AST: 22 U/L (ref 15–41)
Alkaline Phosphatase: 66 U/L (ref 38–126)
BUN: 15 mg/dL (ref 8–23)
CHLORIDE: 107 mmol/L (ref 98–111)
CO2: 28 mmol/L (ref 22–32)
Calcium: 9.6 mg/dL (ref 8.9–10.3)
Creatinine, Ser: 1.04 mg/dL (ref 0.61–1.24)
GFR calc Af Amer: >60 mL/min (ref >60)
GFR calc non Af Amer: >60 mL/min (ref >60)
Glucose, Bld: 110 mg/dL — ABNORMAL HIGH (ref 70–99)
POTASSIUM: 4.1 mmol/L (ref 3.5–5.1)
Sodium: 142 mmol/L (ref 135–145)
Total Bilirubin: 1.3 mg/dL — ABNORMAL HIGH (ref 0.3–1.2)
Total Protein: 7.5 g/dL (ref 6.5–8.1)

## 2018-01-08 LAB — CBC WITH DIFFERENTIAL/PLATELET
Basophils Absolute: 0 10*3/uL (ref 0.0–0.1)
Basophils Relative: 1 %
EOS PCT: 1 %
Eosinophils Absolute: 0.1 10*3/uL (ref 0.0–0.5)
HEMATOCRIT: 40.5 % (ref 38.4–49.9)
HEMOGLOBIN: 13.8 g/dL (ref 13.0–17.1)
LYMPHS ABS: 1.9 10*3/uL (ref 0.9–3.3)
LYMPHS PCT: 28 %
MCH: 34.6 pg — ABNORMAL HIGH (ref 27.2–33.4)
MCHC: 34.2 g/dL (ref 32.0–36.0)
MCV: 101.3 fL — AB (ref 79.3–98.0)
Monocytes Absolute: 0.5 10*3/uL (ref 0.1–0.9)
Monocytes Relative: 8 %
NEUTROS ABS: 4.2 10*3/uL (ref 1.5–6.5)
Neutrophils Relative %: 62 %
Platelets: 281 10*3/uL (ref 140–400)
RBC: 4 MIL/uL — AB (ref 4.20–5.82)
RDW: 13.1 % (ref 11.0–14.6)
WBC: 6.7 10*3/uL (ref 4.0–10.3)

## 2018-01-09 LAB — KAPPA/LAMBDA LIGHT CHAINS
Kappa free light chain: 43.4 mg/L — ABNORMAL HIGH (ref 3.3–19.4)
Kappa, lambda light chain ratio: 4.77 — ABNORMAL HIGH (ref 0.26–1.65)
Lambda free light chains: 9.1 mg/L (ref 5.7–26.3)

## 2018-01-09 LAB — MULTIPLE MYELOMA PANEL, SERUM
Albumin SerPl Elph-Mcnc: 4 g/dL (ref 2.9–4.4)
Albumin/Glob SerPl: 1.4 (ref 0.7–1.7)
Alpha 1: 0.2 g/dL (ref 0.0–0.4)
Alpha2 Glob SerPl Elph-Mcnc: 0.5 g/dL (ref 0.4–1.0)
B-GLOBULIN SERPL ELPH-MCNC: 1.2 g/dL (ref 0.7–1.3)
Gamma Glob SerPl Elph-Mcnc: 1.2 g/dL (ref 0.4–1.8)
Globulin, Total: 3 g/dL (ref 2.2–3.9)
IGG (IMMUNOGLOBIN G), SERUM: 1303 mg/dL (ref 700–1600)
IgA: 302 mg/dL (ref 61–437)
IgM (Immunoglobulin M), Srm: 28 mg/dL (ref 20–172)
M PROTEIN SERPL ELPH-MCNC: 0.7 g/dL — AB
TOTAL PROTEIN ELP: 7 g/dL (ref 6.0–8.5)

## 2018-01-09 LAB — BETA 2 MICROGLOBULIN, SERUM: Beta-2 Microglobulin: 1.3 mg/L (ref 0.6–2.4)

## 2018-01-15 ENCOUNTER — Encounter: Payer: Self-pay | Admitting: Hematology and Oncology

## 2018-01-15 ENCOUNTER — Telehealth: Payer: Self-pay | Admitting: Hematology and Oncology

## 2018-01-15 ENCOUNTER — Ambulatory Visit: Payer: Medicare HMO | Admitting: Hematology and Oncology

## 2018-01-15 ENCOUNTER — Inpatient Hospital Stay: Payer: Medicare HMO | Attending: Hematology and Oncology | Admitting: Hematology and Oncology

## 2018-01-15 DIAGNOSIS — D539 Nutritional anemia, unspecified: Secondary | ICD-10-CM | POA: Diagnosis not present

## 2018-01-15 DIAGNOSIS — D472 Monoclonal gammopathy: Secondary | ICD-10-CM | POA: Diagnosis not present

## 2018-01-15 DIAGNOSIS — E538 Deficiency of other specified B group vitamins: Secondary | ICD-10-CM | POA: Diagnosis not present

## 2018-01-15 DIAGNOSIS — D518 Other vitamin B12 deficiency anemias: Secondary | ICD-10-CM

## 2018-01-15 DIAGNOSIS — D519 Vitamin B12 deficiency anemia, unspecified: Secondary | ICD-10-CM

## 2018-01-15 DIAGNOSIS — Z7982 Long term (current) use of aspirin: Secondary | ICD-10-CM | POA: Diagnosis not present

## 2018-01-15 DIAGNOSIS — Z79899 Other long term (current) drug therapy: Secondary | ICD-10-CM | POA: Insufficient documentation

## 2018-01-15 NOTE — Assessment & Plan Note (Signed)
Clinically, he has no signs of disease progression. Plan to see him next year in with repeat blood work, examination and blood work. The patient is up-to-date with his vaccination We discussed some of the signs and symptoms of watch out for disease progression

## 2018-01-15 NOTE — Telephone Encounter (Signed)
Gave pt avs and calendar  °

## 2018-01-15 NOTE — Assessment & Plan Note (Signed)
He was diagnosed with vitamin B12 deficiency He appears to have responded well to vitamin B12 injections through his primary care doctor I would defer to them for further management

## 2018-01-15 NOTE — Progress Notes (Signed)
[facility] OFFICE PROGRESS NOTE  Patient Care Team: Biagio Borg, MD as PCP - General Clearnce Sorrel, MD as Attending Physician (Neurology) Earnie Larsson, MD (Neurosurgery) Heath Lark, MD as Consulting Physician (Hematology and Oncology)  ASSESSMENT & PLAN:  MGUS (monoclonal gammopathy of unknown significance) Clinically, he has no signs of disease progression. Plan to see him next year in with repeat blood work, examination and blood work. The patient is up-to-date with his vaccination We discussed some of the signs and symptoms of watch out for disease progression  Macrocytic anemia with vitamin B12 deficiency He was diagnosed with vitamin B12 deficiency He appears to have responded well to vitamin B12 injections through his primary care doctor I would defer to them for further management   Orders Placed This Encounter  Procedures  . Comprehensive metabolic panel    Standing Status:   Future    Standing Expiration Date:   02/19/2019  . CBC with Differential/Platelet    Standing Status:   Future    Standing Expiration Date:   02/19/2019  . Kappa/lambda light chains    Standing Status:   Future    Standing Expiration Date:   02/19/2019  . Multiple Myeloma Panel (SPEP&IFE w/QIG)    Standing Status:   Future    Standing Expiration Date:   02/19/2019    INTERVAL HISTORY: Please see below for problem oriented charting. He returns for further follow-up He is active with all activities of daily living Denies new bone pain.  He has chronic arthritis He is up-to-date with influenza vaccination.  Denies recent infection, fever or chills SUMMARY OF ONCOLOGIC HISTORY:  This is a pleasant gentleman who was diagnosed with monoclonal gammopathy of unknown significance in 2013. The patient has history of anemia. Subsequent evaluation found that the patient had B12 deficiency. He was placed on B12 replacement therapy. 24-hour urine collection x-ray and blood work did not  show evidence of end organ damage and he is being observed. He also has history of prostate cancer status post resection. His follow closely with his urologist.  REVIEW OF SYSTEMS:   Constitutional: Denies fevers, chills or abnormal weight loss Eyes: Denies blurriness of vision Ears, nose, mouth, throat, and face: Denies mucositis or sore throat Respiratory: Denies cough, dyspnea or wheezes Cardiovascular: Denies palpitation, chest discomfort or lower extremity swelling Gastrointestinal:  Denies nausea, heartburn or change in bowel habits Skin: Denies abnormal skin rashes Lymphatics: Denies new lymphadenopathy or easy bruising Neurological:Denies numbness, tingling or new weaknesses Behavioral/Psych: Mood is stable, no new changes  All other systems were reviewed with the patient and are negative.  I have reviewed the past medical history, past surgical history, social history and family history with the patient and they are unchanged from previous note.  ALLERGIES:  is allergic to no known allergies.  MEDICATIONS:  Current Outpatient Medications  Medication Sig Dispense Refill  . allopurinol (ZYLOPRIM) 300 MG tablet Take 1 tablet (300 mg total) by mouth daily. 90 tablet 3  . allopurinol (ZYLOPRIM) 300 MG tablet Take 1 tablet (300 mg total) by mouth daily. 90 tablet 3  . amLODipine (NORVASC) 5 MG tablet TAKE 1 TABLET BY MOUTH EVERY DAY 90 tablet 1  . Ascorbic Acid (VITAMIN C WITH ROSE HIPS) 1000 MG tablet Take 1 tablet (1,000 mg total) by mouth daily. 90 tablet 0  . aspirin 81 MG chewable tablet Chew 1 tablet (81 mg total) by mouth daily.    Marland Kitchen atorvastatin (LIPITOR) 40 MG tablet  Take 1 tablet (40 mg total) by mouth daily. 90 tablet 3  . atorvastatin (LIPITOR) 40 MG tablet Take 1 tablet (40 mg total) by mouth daily. 90 tablet 3  . cholecalciferol (VITAMIN D) 1000 units tablet TAKE 2 TABLETS EVERY DAY 180 tablet 0  . Cholecalciferol (VITAMIN D-3) 1000 units CAPS Take 2 capsules (2,000  Units total) by mouth daily. 180 capsule 0  . colchicine 0.6 MG tablet Take 1 tablet (0.6 mg total) by mouth 2 (two) times daily. For 5 days when having flare. 60 tablet 2  . Cyanocobalamin (B-12 COMPLIANCE INJECTION IJ) Inject 1 Dose as directed every 30 (thirty) days.    Marland Kitchen gabapentin (NEURONTIN) 300 MG capsule TAKE 2 CAPSULES BY MOUTH 3  TIMES DAILY 180 capsule 3  . ondansetron (ZOFRAN) 4 MG tablet Take 1 tablet (4 mg total) by mouth every 8 (eight) hours as needed for nausea or vomiting. 30 tablet 0  . oxyCODONE (ROXICODONE) 5 MG immediate release tablet Take 1-2 tablets (5-10 mg total) by mouth every 4 (four) hours as needed for severe pain. 40 tablet 0  . PRESCRIPTION MEDICATION 1 Syringe by Intracavernosal route every other day as needed (10-30 minutes before you have sex for ED). Matrix (Papaverine 50m Phentolamine 139mAlprostadil 4054mper ml injection)  erectile dysfunction injection every other day as needed for sex    . sennosides-docusate sodium (SENOKOT-S) 8.6-50 MG tablet Take 2 tablets by mouth daily. 30 tablet 1  . tiZANidine (ZANAFLEX) 4 MG tablet TAKE 1 TABLET BY MOUTH 3  TIMES DAILY AS NEEDED FOR  MUSCLE SPASM(S) 270 tablet 1   Current Facility-Administered Medications  Medication Dose Route Frequency Provider Last Rate Last Dose  . cyanocobalamin ((VITAMIN B-12)) injection 1,000 mcg  1,000 mcg Intramuscular Once JohBiagio BorgD        PHYSICAL EXAMINATION: ECOG PERFORMANCE STATUS: 1 - Symptomatic but completely ambulatory  Vitals:   01/15/18 0915  BP: (!) 148/88  Pulse: (!) 58  Resp: 18  Temp: 97.8 F (36.6 C)  SpO2: 100%   Filed Weights   01/15/18 0915  Weight: 159 lb 6.4 oz (72.3 kg)    GENERAL:alert, no distress and comfortable Musculoskeletal:no cyanosis of digits and no clubbing  NEURO: alert & oriented x 3 with fluent speech, no focal motor/sensory deficits  LABORATORY DATA:  I have reviewed the data as listed    Component Value Date/Time   NA 142  01/08/2018 1027   NA 141 12/29/2016 1152   K 4.1 01/08/2018 1027   K 4.1 12/29/2016 1152   CL 107 01/08/2018 1027   CL 104 06/28/2012 1010   CO2 28 01/08/2018 1027   CO2 29 12/29/2016 1152   GLUCOSE 110 (H) 01/08/2018 1027   GLUCOSE 191 (H) 12/29/2016 1152   GLUCOSE 127 (H) 06/28/2012 1010   BUN 15 01/08/2018 1027   BUN 15.1 12/29/2016 1152   CREATININE 1.04 01/08/2018 1027   CREATININE 1.1 12/29/2016 1152   CALCIUM 9.6 01/08/2018 1027   CALCIUM 9.7 12/29/2016 1152   PROT 7.5 01/08/2018 1027   PROT 7.4 12/29/2016 1152   PROT 6.8 12/29/2016 1152   ALBUMIN 4.3 01/08/2018 1027   ALBUMIN 3.9 12/29/2016 1152   AST 22 01/08/2018 1027   AST 20 12/29/2016 1152   ALT 30 01/08/2018 1027   ALT 23 12/29/2016 1152   ALKPHOS 66 01/08/2018 1027   ALKPHOS 78 12/29/2016 1152   BILITOT 1.3 (H) 01/08/2018 1027   BILITOT 1.19 12/29/2016 1152  GFRNONAA >60 01/08/2018 1027   GFRAA >60 01/08/2018 1027    No results found for: SPEP, UPEP  Lab Results  Component Value Date   WBC 6.7 01/08/2018   NEUTROABS 4.2 01/08/2018   HGB 13.8 01/08/2018   HCT 40.5 01/08/2018   MCV 101.3 (H) 01/08/2018   PLT 281 01/08/2018      Chemistry      Component Value Date/Time   NA 142 01/08/2018 1027   NA 141 12/29/2016 1152   K 4.1 01/08/2018 1027   K 4.1 12/29/2016 1152   CL 107 01/08/2018 1027   CL 104 06/28/2012 1010   CO2 28 01/08/2018 1027   CO2 29 12/29/2016 1152   BUN 15 01/08/2018 1027   BUN 15.1 12/29/2016 1152   CREATININE 1.04 01/08/2018 1027   CREATININE 1.1 12/29/2016 1152      Component Value Date/Time   CALCIUM 9.6 01/08/2018 1027   CALCIUM 9.7 12/29/2016 1152   ALKPHOS 66 01/08/2018 1027   ALKPHOS 78 12/29/2016 1152   AST 22 01/08/2018 1027   AST 20 12/29/2016 1152   ALT 30 01/08/2018 1027   ALT 23 12/29/2016 1152   BILITOT 1.3 (H) 01/08/2018 1027   BILITOT 1.19 12/29/2016 1152      All questions were answered. The patient knows to call the clinic with any problems,  questions or concerns. No barriers to learning was detected.  I spent 10 minutes counseling the patient face to face. The total time spent in the appointment was 15 minutes and more than 50% was on counseling and review of test results  Heath Lark, MD 01/15/2018 10:50 AM

## 2018-02-06 ENCOUNTER — Ambulatory Visit (INDEPENDENT_AMBULATORY_CARE_PROVIDER_SITE_OTHER): Payer: Medicare HMO

## 2018-02-06 DIAGNOSIS — E538 Deficiency of other specified B group vitamins: Secondary | ICD-10-CM

## 2018-02-06 MED ORDER — CYANOCOBALAMIN 1000 MCG/ML IJ SOLN
1000.0000 ug | Freq: Once | INTRAMUSCULAR | Status: AC
  Administered 2018-02-06: 1000 ug via INTRAMUSCULAR

## 2018-03-12 ENCOUNTER — Ambulatory Visit (INDEPENDENT_AMBULATORY_CARE_PROVIDER_SITE_OTHER): Payer: Medicare HMO

## 2018-03-12 DIAGNOSIS — E538 Deficiency of other specified B group vitamins: Secondary | ICD-10-CM | POA: Diagnosis not present

## 2018-03-12 MED ORDER — CYANOCOBALAMIN 1000 MCG/ML IJ SOLN
1000.0000 ug | Freq: Once | INTRAMUSCULAR | Status: AC
  Administered 2018-03-12: 1000 ug via INTRAMUSCULAR

## 2018-03-12 NOTE — Progress Notes (Signed)
Medical screening examination/treatment/procedure(s) were performed by non-physician practitioner and as supervising physician I was immediately available for consultation/collaboration. I agree with above. Cindie Rajagopalan, MD   

## 2018-03-29 ENCOUNTER — Other Ambulatory Visit: Payer: Self-pay | Admitting: Internal Medicine

## 2018-04-16 ENCOUNTER — Ambulatory Visit (INDEPENDENT_AMBULATORY_CARE_PROVIDER_SITE_OTHER): Payer: Medicare HMO | Admitting: Emergency Medicine

## 2018-04-16 DIAGNOSIS — E538 Deficiency of other specified B group vitamins: Secondary | ICD-10-CM | POA: Diagnosis not present

## 2018-04-16 MED ORDER — CYANOCOBALAMIN 1000 MCG/ML IJ SOLN
1000.0000 ug | Freq: Once | INTRAMUSCULAR | Status: AC
  Administered 2018-04-16: 1000 ug via INTRAMUSCULAR

## 2018-04-25 ENCOUNTER — Other Ambulatory Visit: Payer: Self-pay | Admitting: Internal Medicine

## 2018-04-25 MED ORDER — AMLODIPINE BESYLATE 5 MG PO TABS: 5.0000 mg | ORAL_TABLET | Freq: Every day | ORAL | 0 refills | Status: DC

## 2018-04-25 NOTE — Telephone Encounter (Signed)
Patient is compliant with injections and may not be aware he needs 6 month follow up for this medication- left message that refill will be given to extend him to February appointment- please keep that appointment to resume 90 day supply. Requested Prescriptions  Pending Prescriptions Disp Refills  . amLODipine (NORVASC) 5 MG tablet 30 tablet 0    Sig: Take 1 tablet (5 mg total) by mouth daily.     Cardiovascular:  Calcium Channel Blockers Failed - 04/25/2018  2:33 PM      Failed - Last BP in normal range    BP Readings from Last 1 Encounters:  01/15/18 (!) 148/88         Failed - Valid encounter within last 6 months    Recent Outpatient Visits          11 months ago Preventative health care   [facility] -Georges Mouse, MD   1 year ago Bilateral shoulder pain, unspecified chronicity   [place] [place], [place]   2 years ago Essential hypertension   [facility] -Georges Mouse, MD   2 years ago AKI (acute kidney injury) [facility])   [facility] -Georges Mouse, MD   2 years ago Cervical disc disorder with radiculopathy of cervical region   [place], [place], DO      Future Appointments            In 3 weeks Jenny Reichmann, Hunt Oris, MD [place], [facility]

## 2018-04-25 NOTE — Telephone Encounter (Signed)
Copied from [place] [id number]. Topic: Quick Communication - Rx Refill/Question >> Apr 25, 2018  2:28 PM Carolyn Stare wrote: Medication    amLODipine (NORVASC) 5 MG tablet  Has the patient contacted their pharmacy yes   (Agent: If no, request that the patient contact the pharmacy for the refill.) (Agent: If yes, when and what did the pharmacy advise?)  Preferred Pharmacy  [facility]   Agent: Please be advised that RX refills may take up to 3 business days. We ask that you follow-up with your pharmacy.

## 2018-04-26 ENCOUNTER — Other Ambulatory Visit: Payer: Self-pay

## 2018-04-26 MED ORDER — AMLODIPINE BESYLATE 5 MG PO TABS: 5.0000 mg | ORAL_TABLET | Freq: Every day | ORAL | 1 refills | Status: DC

## 2018-05-02 DIAGNOSIS — C61 Malignant neoplasm of prostate: Secondary | ICD-10-CM | POA: Diagnosis not present

## 2018-05-09 DIAGNOSIS — N3946 Mixed incontinence: Secondary | ICD-10-CM | POA: Diagnosis not present

## 2018-05-09 DIAGNOSIS — N5201 Erectile dysfunction due to arterial insufficiency: Secondary | ICD-10-CM | POA: Diagnosis not present

## 2018-05-09 DIAGNOSIS — C61 Malignant neoplasm of prostate: Secondary | ICD-10-CM | POA: Diagnosis not present

## 2018-05-16 ENCOUNTER — Ambulatory Visit (INDEPENDENT_AMBULATORY_CARE_PROVIDER_SITE_OTHER): Payer: Medicare HMO | Admitting: Emergency Medicine

## 2018-05-16 DIAGNOSIS — E538 Deficiency of other specified B group vitamins: Secondary | ICD-10-CM | POA: Diagnosis not present

## 2018-05-16 MED ORDER — CYANOCOBALAMIN 1000 MCG/ML IJ SOLN
1000.0000 ug | Freq: Once | INTRAMUSCULAR | Status: AC
  Administered 2018-05-16: 1000 ug via INTRAMUSCULAR

## 2018-05-16 NOTE — Progress Notes (Signed)
Medical screening examination/treatment/procedure(s) were performed by non-physician practitioner and as supervising physician I was immediately available for consultation/collaboration. I agree with above. Alawna Graybeal, MD   

## 2018-05-17 ENCOUNTER — Ambulatory Visit: Payer: Medicare HMO

## 2018-05-22 ENCOUNTER — Encounter: Payer: Self-pay | Admitting: Internal Medicine

## 2018-05-22 ENCOUNTER — Ambulatory Visit (INDEPENDENT_AMBULATORY_CARE_PROVIDER_SITE_OTHER): Payer: Medicare HMO | Admitting: Internal Medicine

## 2018-05-22 VITALS — BP 108/58 | HR 72 | Ht 71.5 in | Wt 164.0 lb

## 2018-05-22 DIAGNOSIS — R739 Hyperglycemia, unspecified: Secondary | ICD-10-CM

## 2018-05-22 DIAGNOSIS — Z0000 Encounter for general adult medical examination without abnormal findings: Secondary | ICD-10-CM

## 2018-05-22 DIAGNOSIS — Z23 Encounter for immunization: Secondary | ICD-10-CM

## 2018-05-22 DIAGNOSIS — R7302 Impaired glucose tolerance (oral): Secondary | ICD-10-CM | POA: Diagnosis not present

## 2018-05-22 LAB — POCT GLYCOSYLATED HEMOGLOBIN (HGB A1C): Hemoglobin A1C: 5.2 % (ref 4.0–5.6)

## 2018-05-22 MED ORDER — COLCHICINE 0.6 MG PO TABS: 0.6000 mg | ORAL_TABLET | Freq: Two times a day (BID) | ORAL | 1 refills | Status: DC

## 2018-05-22 NOTE — Assessment & Plan Note (Signed)
For a1c today

## 2018-05-22 NOTE — Progress Notes (Addendum)
Subjective:    Patient ID: Nathan Wallace, male    DOB: 12/31/1950, 68 y.o.   MRN: [id number]  HPI  Here for wellness and f/u;  Overall doing ok;  Pt denies Chest pain, worsening SOB, DOE, wheezing, orthopnea, PND, worsening LE edema, palpitations, dizziness or syncope.  Pt denies neurological change such as new headache, facial or extremity weakness.  Pt denies polydipsia, polyuria, or low sugar symptoms. Pt states overall good compliance with treatment and medications, good tolerability, and has been trying to follow appropriate diet.  Pt denies worsening depressive symptoms, suicidal ideation or panic. No fever, night sweats, wt loss, loss of appetite, or other constitutional symptoms.  Pt states good ability with ADL's, has low fall risk, home safety reviewed and adequate, no other significant changes in hearing or vision, and only occasionally active with exercise.  S/p recent right shoulder replaced, also likely for left wrist surgury soon.  No new complaints. Due for Heme.onc f/u in oct 2020. Saw urology with essentially zero PSA per pt Past Medical History:  Diagnosis Date  . AKI (acute kidney injury) ([place])   . Arthritis    RT KNEE AND RT SHOULDER  . B12 deficiency   . Benign head tremor   . Chest tightness    comes and goes   . CVA (cerebrovascular accident due to intracerebral hemorrhage) ([place]) 08/03/2011  . Degenerative joint disease of knee, right   . Depression 07/26/2012   no problems now  . GERD (gastroesophageal reflux disease)    NO RECENT PROBLEMS AND NO MEDS  . History of blood transfusion   . History of kidney stones   . History of urinary tract infection   . Hyperlipidemia    border line  . Hypertension   . Impaired glucose tolerance 06/22/2011  . Left inguinal hernia   . MGUS (monoclonal gammopathy of unknown significance)   . Prostate cancer ([place]) 2011  . Rotator cuff tear arthropathy of right shoulder, with instability 10/25/2016  . Sepsis ([place])   . Stress  incontinence   . Stroke [facility])    2013  . Syncope 2012   after prostate surgery; neg neurology work up.    Past Surgical History:  Procedure Laterality Date  . COLONOSCOPY    . INGUINAL HERNIA REPAIR  06/01/2011   Procedure: HERNIA REPAIR INGUINAL ADULT;  Surgeon: Adin Hector, MD;  Location: [facility];  Service: General;  Laterality: Left;  Left Inguinal Hernia Repair with Mesh  . ROBOT ASSISTED LAPAROSCOPIC RADICAL PROSTATECTOMY  jan 2012   da vinci  . SPINE SURGERY  2003 OR 2004   CERVICAL FUSION-STATES LIMITED ROM NECK  . TOTAL SHOULDER ARTHROPLASTY Right 10/25/2016  . TOTAL SHOULDER ARTHROPLASTY Right 10/25/2016   Procedure: RIGHT TOTAL SHOULDER ARTHROPLASTY;  Surgeon: Marchia Bond, MD;  Location: [place];  Service: Orthopedics;  Laterality: Right;  . URETHRAL SLING N/A 03/08/2016   Procedure: MALE SLING CYSTOSCOPY;  Surgeon: Bjorn Loser, MD;  Location: [facility];  Service: Urology;  Laterality: N/A;  . WRIST SURGERY     right     reports that he quit smoking about 51 years ago. His smoking use included cigarettes. He has a 0.50 pack-year smoking history. He has never used smokeless tobacco. He reports current alcohol use. He reports current drug use. Drug: Marijuana. family history includes Aneurysm in his mother; Cancer in his father; Colon cancer in his father; Heart attack in his brother; Hypertension in his mother. Allergies  Allergen Reactions  .  No Known Allergies    Current Outpatient Medications on File Prior to Visit  Medication Sig Dispense Refill  . allopurinol (ZYLOPRIM) 300 MG tablet Take 1 tablet (300 mg total) by mouth daily. 90 tablet 3  . amLODipine (NORVASC) 5 MG tablet Take 1 tablet (5 mg total) by mouth daily. 90 tablet 1  . Ascorbic Acid (VITAMIN C WITH ROSE HIPS) 1000 MG tablet Take 1 tablet (1,000 mg total) by mouth daily. 90 tablet 0  . aspirin 81 MG chewable tablet Chew 1 tablet (81 mg total) by mouth daily.    Marland Kitchen atorvastatin (LIPITOR) 40 MG tablet  Take 1 tablet (40 mg total) by mouth daily. 90 tablet 3  . Cholecalciferol (VITAMIN D-3) 1000 units CAPS Take 2 capsules (2,000 Units total) by mouth daily. 180 capsule 0  . Cyanocobalamin (B-12 COMPLIANCE INJECTION IJ) Inject 1 Dose as directed every 30 (thirty) days.    Marland Kitchen gabapentin (NEURONTIN) 300 MG capsule TAKE 2 CAPSULES BY MOUTH 3  TIMES DAILY 180 capsule 3  . PRESCRIPTION MEDICATION 1 Syringe by Intracavernosal route every other day as needed (10-30 minutes before you have sex for ED). Matrix (Papaverine 30mg  Phentolamine 1mg  Alprostadil 41mcg per ml injection)  erectile dysfunction injection every other day as needed for sex    . tiZANidine (ZANAFLEX) 4 MG tablet TAKE 1 TABLET BY MOUTH 3  TIMES DAILY AS NEEDED FOR  MUSCLE SPASM(S) 270 tablet 1  . oxyCODONE (ROXICODONE) 5 MG immediate release tablet Take 1-2 tablets (5-10 mg total) by mouth every 4 (four) hours as needed for severe pain. (Patient not taking: Reported on 05/22/2018) 40 tablet 0  . sennosides-docusate sodium (SENOKOT-S) 8.6-50 MG tablet Take 2 tablets by mouth daily. (Patient not taking: Reported on 05/22/2018) 30 tablet 1   Current Facility-Administered Medications on File Prior to Visit  Medication Dose Route Frequency Provider Last Rate Last Dose  . cyanocobalamin ((VITAMIN B-12)) injection 1,000 mcg  1,000 mcg Intramuscular Once Biagio Borg, MD       Review of Systems Constitutional: Negative for other unusual diaphoresis, sweats, appetite or weight changes HENT: Negative for other worsening hearing loss, ear pain, facial swelling, mouth sores or neck stiffness.   Eyes: Negative for other worsening pain, redness or other visual disturbance.  Respiratory: Negative for other stridor or swelling Cardiovascular: Negative for other palpitations or other chest pain  Gastrointestinal: Negative for worsening diarrhea or loose stools, blood in stool, distention or other pain Genitourinary: Negative for hematuria, flank pain or  other change in urine volume.  Musculoskeletal: Negative for myalgias or other joint swelling.  Skin: Negative for other color change, or other wound or worsening drainage.  Neurological: Negative for other syncope or numbness. Hematological: Negative for other adenopathy or swelling Psychiatric/Behavioral: Negative for hallucinations, other worsening agitation, SI, self-injury, or new decreased concentration All other system neg per pt    Objective:   Physical Exam BP (!) 108/58 (BP Location: Left Arm, Patient Position: Sitting, Cuff Size: Normal)   Pulse 72   Ht 5' 11.5" (1.816 m)   Wt 164 lb (74.4 kg)   SpO2 97%   BMI 22.55 kg/m  VS noted,  Constitutional: Pt is oriented to person, place, and time. Appears well-developed and well-nourished, in no significant distress and comfortable Head: Normocephalic and atraumatic  Eyes: Conjunctivae and EOM are normal. Pupils are equal, round, and reactive to light Right Ear: External ear normal without discharge Left Ear: External ear normal without discharge Nose: Nose without discharge  or deformity Mouth/Throat: Oropharynx is without other ulcerations and moist  Neck: Normal range of motion. Neck supple. No JVD present. No tracheal deviation present or significant neck LA or mass Cardiovascular: Normal rate, regular rhythm, normal heart sounds and intact distal pulses.   Pulmonary/Chest: WOB normal and breath sounds without rales or wheezing  Abdominal: Soft. Bowel sounds are normal. NT. No HSM  Musculoskeletal: Normal range of motion. Exhibits no edema except for left wrist diffuse degenerative change with effusion in left wrist splint Lymphadenopathy: Has no other cervical adenopathy.  Neurological: Pt is alert and oriented to person, place, and time. Pt has normal reflexes. No cranial nerve deficit. Motor grossly intact, Gait intact Skin: Skin is warm and dry. No rash noted or new ulcerations Psychiatric:  Has normal mood and affect.  Behavior is normal without agitation No other exam findings  Lab Results  Component Value Date   WBC 6.7 01/08/2018   HGB 13.8 01/08/2018   HCT 40.5 01/08/2018   PLT 281 01/08/2018   GLUCOSE 110 (H) 01/08/2018   CHOL 122 05/18/2017   TRIG 133.0 05/18/2017   HDL 40.40 05/18/2017   LDLDIRECT 87.9 12/19/2011   LDLCALC 55 05/18/2017   ALT 30 01/08/2018   AST 22 01/08/2018   NA 142 01/08/2018   K 4.1 01/08/2018   CL 107 01/08/2018   CREATININE 1.04 01/08/2018   BUN 15 01/08/2018   CO2 28 01/08/2018   TSH 1.85 05/18/2017   PSA 0.07 (L) 09/23/2015   INR 1.12 03/02/2016   HGBA1C 6.1 05/18/2017   Declines further lab today  POCT - A1c - POCT glycosylated hemoglobin (Hb A1C)  Order: [id number]  Status:  Final result Visible to patient:  No (Not Released) Dx:  Hyperglycemia   Ref Range & Units 10:02 (05/22/18) 46yr ago (05/18/17) 56yr ago (09/23/15) 23yr ago (05/14/14) 15yr ago (02/28/13) 83yr ago (12/13/11) 76yr ago (06/23/11)  Hemoglobin A1C 4.0 - 5.6 % 5.2  6.1 R, CM 6.0 R, CM 6.2 R, CM 6.2 R, CM 5.9 R, CM 6.0 R, CM           Assessment & Plan:

## 2018-05-22 NOTE — Patient Instructions (Addendum)
You had the Pneumovax pneumonia shot today  Your A1c was OK today  Please continue all other medications as before, and refills have been done if requested.  Please have the pharmacy call with any other refills you may need.  Please continue your efforts at being more active, low cholesterol diet, and weight control.  You are otherwise up to date with prevention measures today.  Please keep your appointments with your specialists as you may have planned  Please return in 1 year for your yearly visit, or sooner if needed, with Lab testing done 3-5 days before

## 2018-05-22 NOTE — Assessment & Plan Note (Signed)

## 2018-06-14 ENCOUNTER — Ambulatory Visit (INDEPENDENT_AMBULATORY_CARE_PROVIDER_SITE_OTHER): Payer: Medicare HMO

## 2018-06-14 ENCOUNTER — Ambulatory Visit: Payer: Medicare HMO

## 2018-06-14 DIAGNOSIS — E538 Deficiency of other specified B group vitamins: Secondary | ICD-10-CM | POA: Diagnosis not present

## 2018-06-14 MED ORDER — CYANOCOBALAMIN 1000 MCG/ML IJ SOLN
1000.0000 ug | Freq: Once | INTRAMUSCULAR | Status: AC
  Administered 2018-06-14: 1000 ug via INTRAMUSCULAR

## 2018-06-14 NOTE — Progress Notes (Signed)
Patient ID: Nathan Wallace, male   DOB: Dec 27, 1950, 68 y.o.   MRN: [id number]  Medical screening examination/treatment/procedure(s) were performed by non-physician practitioner and as supervising physician I was immediately available for consultation/collaboration. I agree with above. Cathlean Cower, MD

## 2018-07-14 IMAGING — DX DG SHOULDER 2+V PORT*R*
2 series · 2 of 2 positions shown · non-contrast
Comparison: 06/26/2016

CLINICAL DATA: Shoulder dislocation, status postreduction

EXAM:
PORTABLE RIGHT SHOULDER

[shoulder ap]
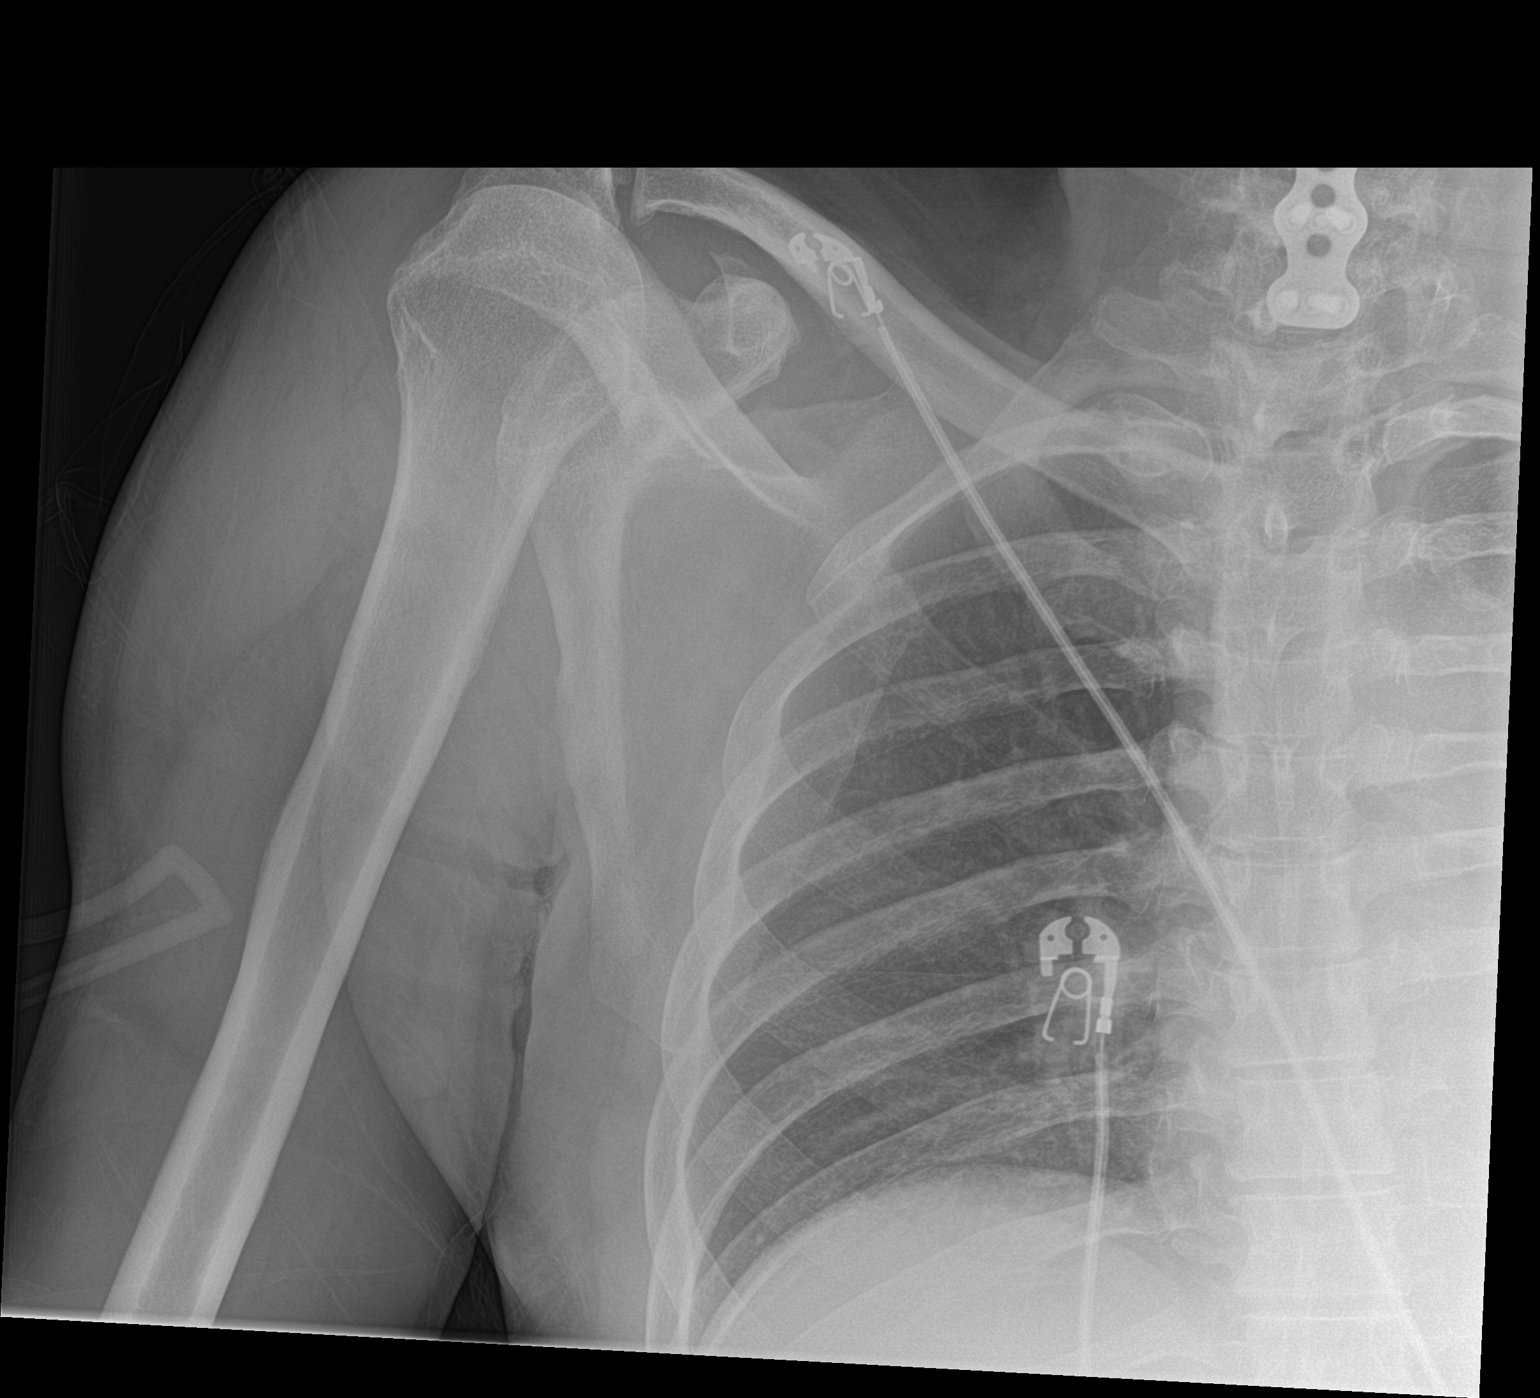

[shoulder obl]
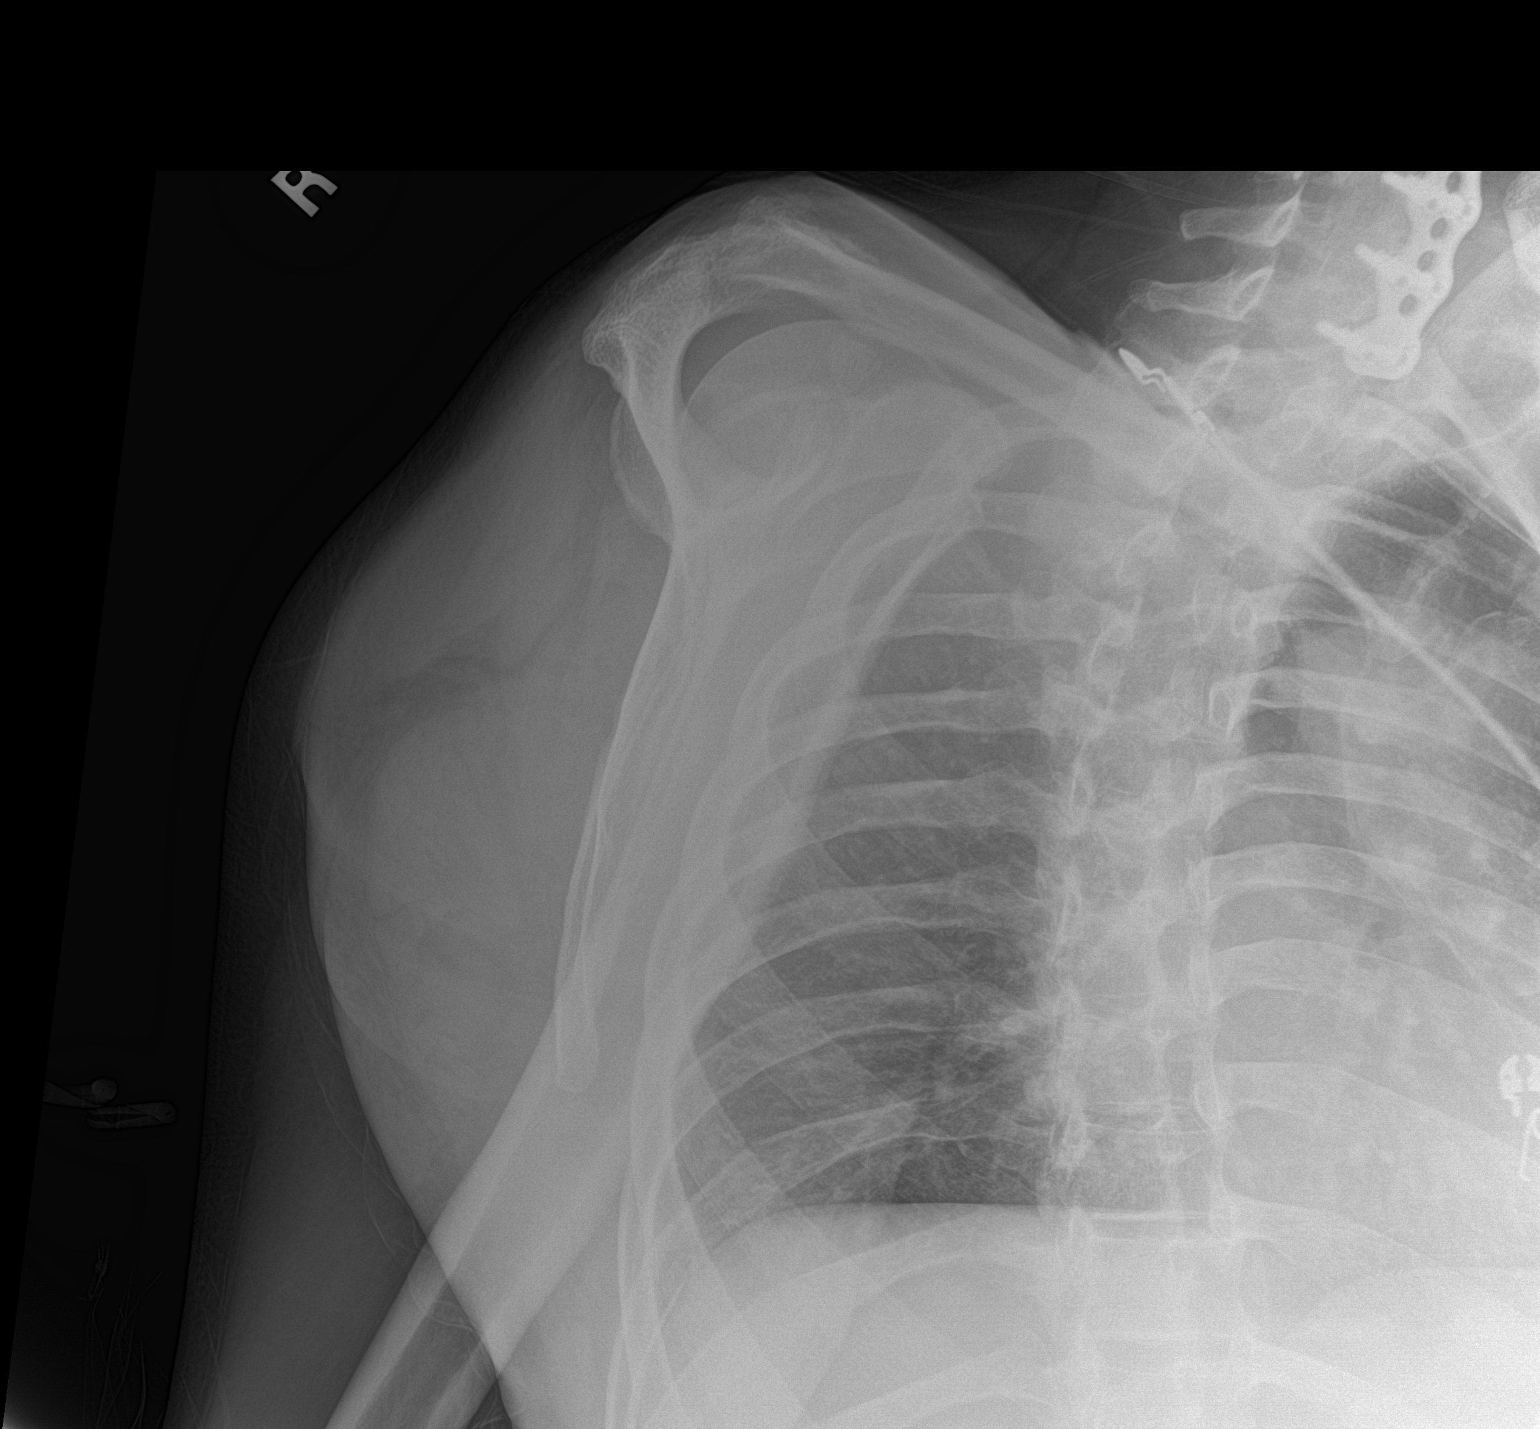

[2 of 2 positions shown; findings below may reference images not displayed]

FINDINGS: Internal rotation and transscapular views demonstrate satisfactory
reduction of the glenohumeral joint. Suspected Hill-Sachs impaction
of the humeral head. Ossification along the coracoclavicular
ligament observed.

Lower cervical plate and screw fixator. Degenerative spurring at the
AC joint.
IMPRESSION: 1. Successful reduction.  Suspected underlying Hill-Sachs impaction.

## 2018-07-16 ENCOUNTER — Ambulatory Visit: Payer: Medicare HMO

## 2018-09-19 ENCOUNTER — Other Ambulatory Visit: Payer: Self-pay | Admitting: Internal Medicine

## 2018-09-25 ENCOUNTER — Ambulatory Visit (INDEPENDENT_AMBULATORY_CARE_PROVIDER_SITE_OTHER): Payer: Medicare HMO

## 2018-09-25 DIAGNOSIS — E538 Deficiency of other specified B group vitamins: Secondary | ICD-10-CM | POA: Diagnosis not present

## 2018-09-25 MED ORDER — CYANOCOBALAMIN 1000 MCG/ML IJ SOLN
1000.0000 ug | Freq: Once | INTRAMUSCULAR | Status: AC
  Administered 2018-09-25: 1000 ug via INTRAMUSCULAR

## 2018-09-25 NOTE — Progress Notes (Signed)
Medical screening examination/treatment/procedure(s) were performed by non-physician practitioner and as supervising physician I was immediately available for consultation/collaboration. I agree with above. Keela Rubert, MD   

## 2018-10-30 ENCOUNTER — Other Ambulatory Visit: Payer: Self-pay | Admitting: Internal Medicine

## 2018-11-05 ENCOUNTER — Other Ambulatory Visit: Payer: Self-pay

## 2018-11-05 ENCOUNTER — Ambulatory Visit (INDEPENDENT_AMBULATORY_CARE_PROVIDER_SITE_OTHER): Payer: Medicare HMO

## 2018-11-05 DIAGNOSIS — E538 Deficiency of other specified B group vitamins: Secondary | ICD-10-CM | POA: Diagnosis not present

## 2018-11-05 MED ORDER — CYANOCOBALAMIN 1000 MCG/ML IJ SOLN
1000.0000 ug | Freq: Once | INTRAMUSCULAR | Status: AC
  Administered 2018-11-05: 1000 ug via INTRAMUSCULAR

## 2018-11-05 NOTE — Progress Notes (Signed)
Medical screening examination/treatment/procedure(s) were performed by non-physician practitioner and as supervising physician I was immediately available for consultation/collaboration. I agree with above. Sriman Tally, MD   

## 2018-11-12 IMAGING — DX DG SHOULDER 2+V PORT*R*
2 series · 2 of 2 positions shown · non-contrast
Comparison: 06/26/2016.

CLINICAL DATA: Status post right shoulder arthroplasty.

EXAM:
PORTABLE RIGHT SHOULDER

[shoulder ap]
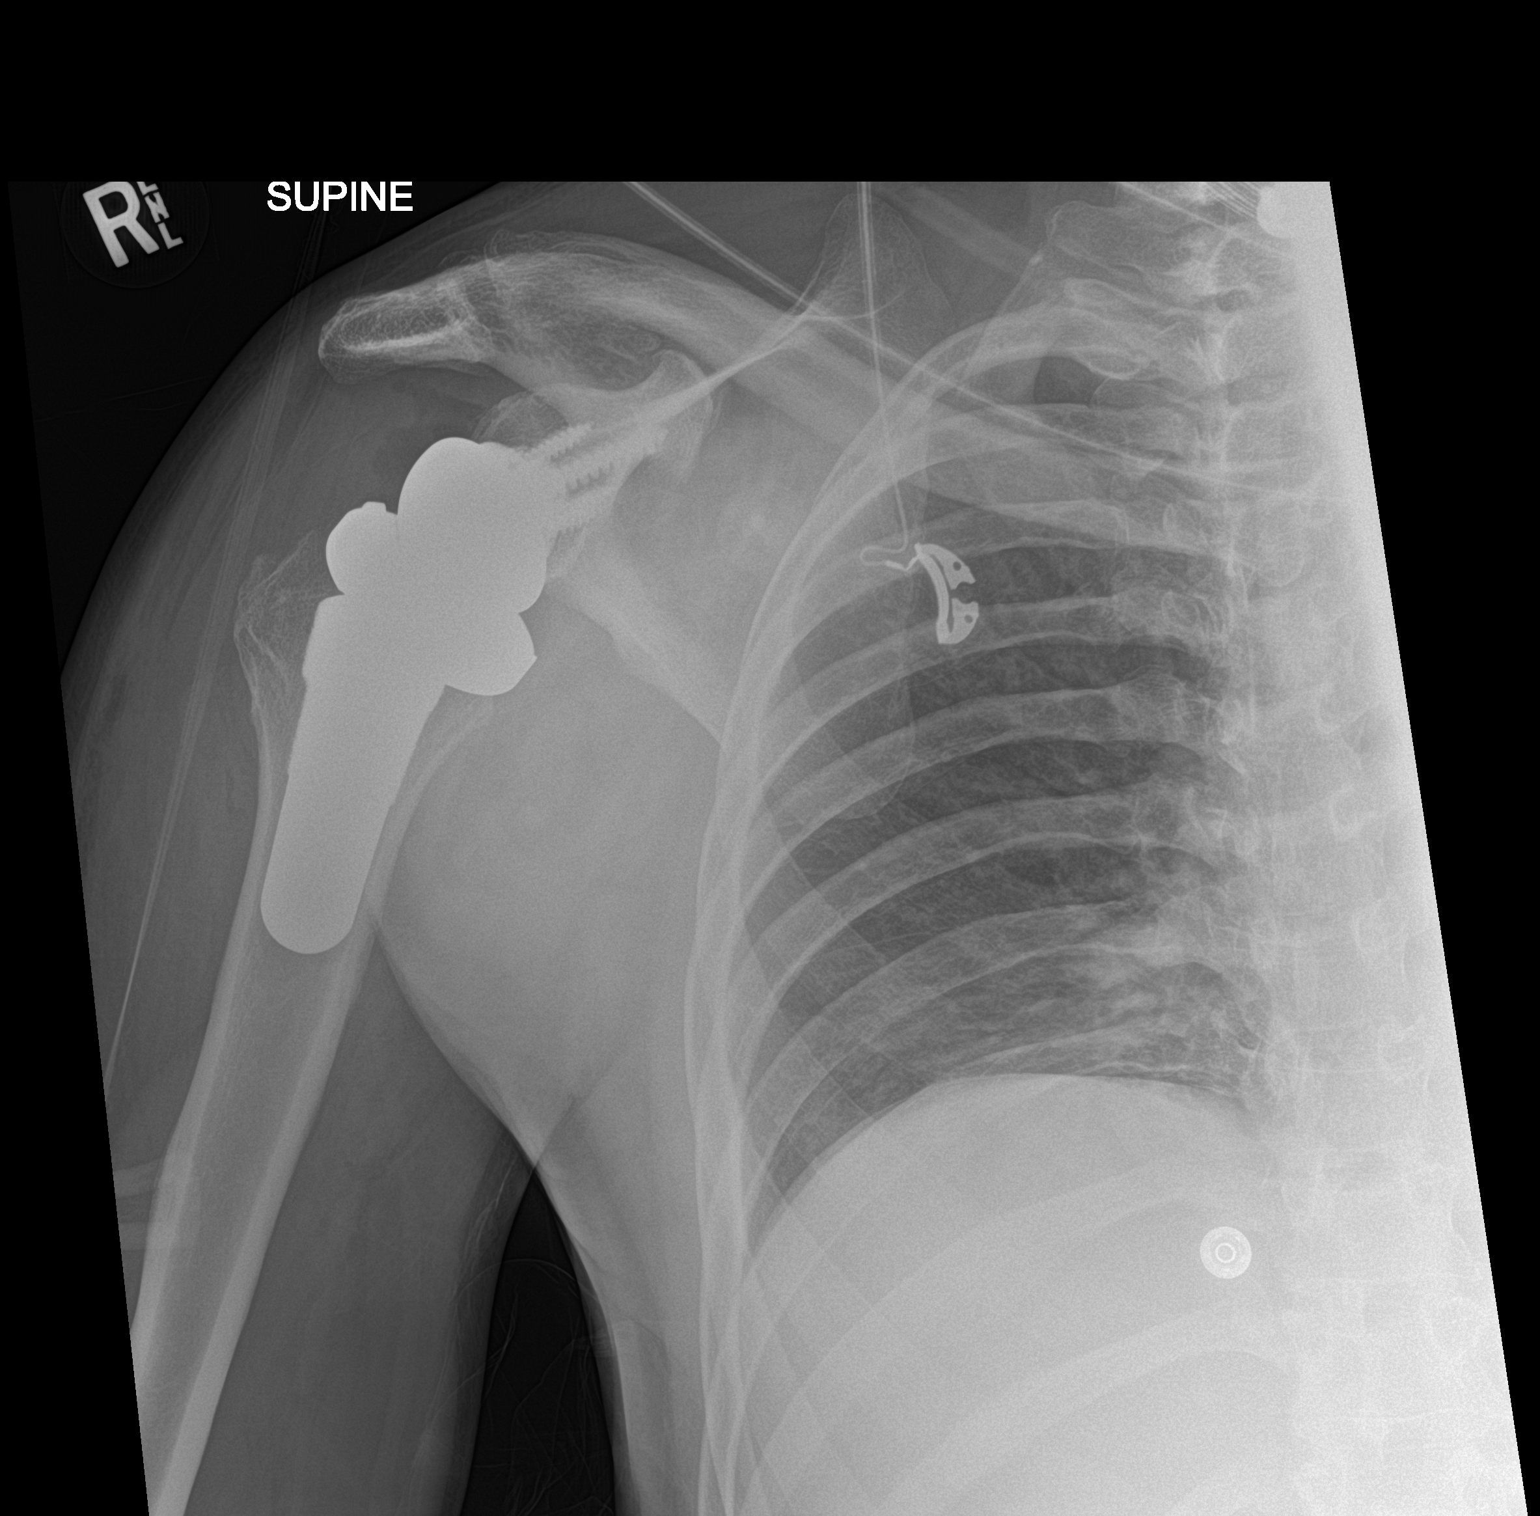

[shoulder obl]
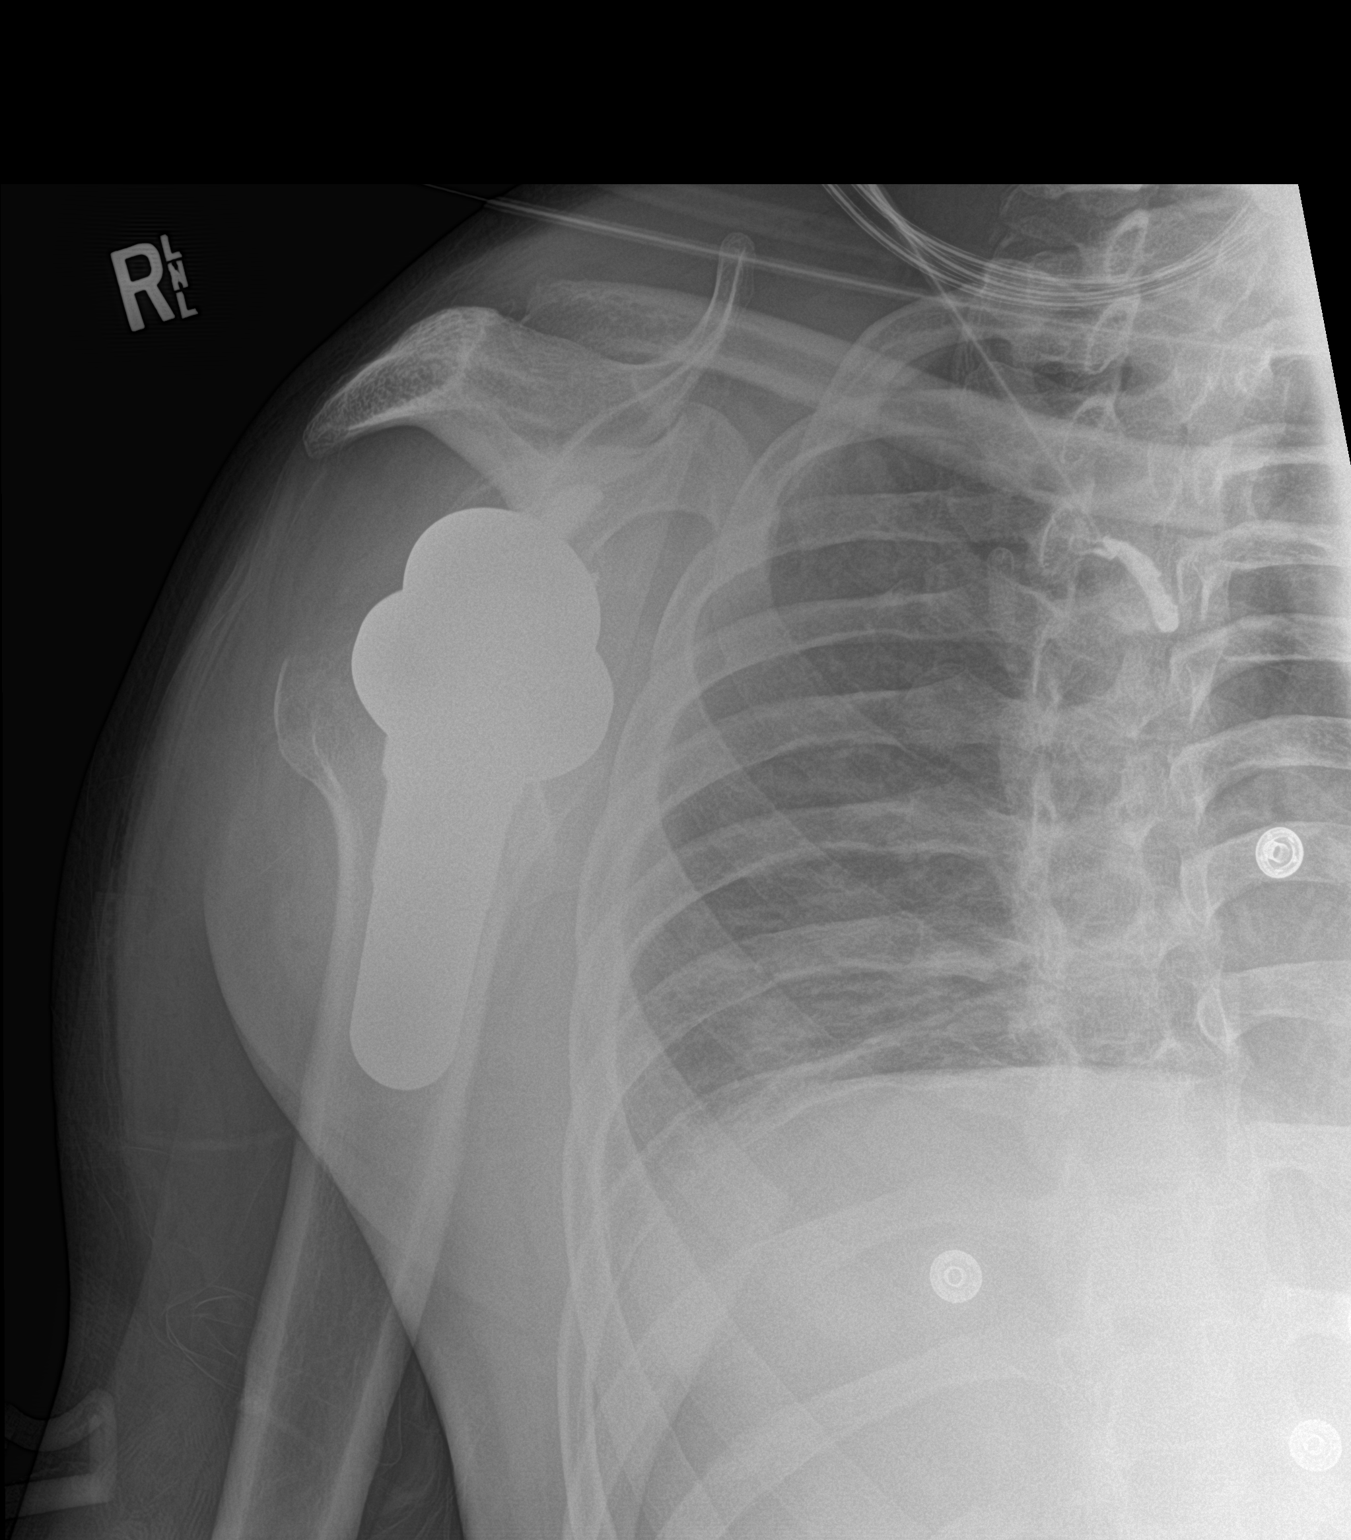

[2 of 2 positions shown; findings below may reference images not displayed]

FINDINGS: Interval right glenohumeral prosthesis in satisfactory position and
alignment. No fracture or dislocation seen.
IMPRESSION: Satisfactory postoperative appearance of a right glenohumeral
prosthesis.

## 2018-12-06 ENCOUNTER — Ambulatory Visit (INDEPENDENT_AMBULATORY_CARE_PROVIDER_SITE_OTHER): Payer: Medicare HMO

## 2018-12-06 DIAGNOSIS — E538 Deficiency of other specified B group vitamins: Secondary | ICD-10-CM

## 2018-12-06 DIAGNOSIS — Z23 Encounter for immunization: Secondary | ICD-10-CM

## 2018-12-06 MED ORDER — CYANOCOBALAMIN 1000 MCG/ML IJ SOLN
1000.0000 ug | Freq: Once | INTRAMUSCULAR | Status: AC
  Administered 2018-12-06: 1000 ug via INTRAMUSCULAR

## 2018-12-06 NOTE — Addendum Note (Signed)
Addended by: Ander Slade on: 12/06/2018 02:05 PM   Modules accepted: Orders

## 2018-12-06 NOTE — Progress Notes (Signed)
Medical screening examination/treatment/procedure(s) were performed by non-physician practitioner and as supervising physician I was immediately available for consultation/collaboration. I agree with above. Tersea Aulds, MD   

## 2019-01-14 ENCOUNTER — Ambulatory Visit (INDEPENDENT_AMBULATORY_CARE_PROVIDER_SITE_OTHER): Payer: Medicare HMO

## 2019-01-14 ENCOUNTER — Other Ambulatory Visit: Payer: Self-pay

## 2019-01-14 ENCOUNTER — Inpatient Hospital Stay: Payer: Medicare HMO | Attending: Hematology and Oncology

## 2019-01-14 DIAGNOSIS — I10 Essential (primary) hypertension: Secondary | ICD-10-CM | POA: Insufficient documentation

## 2019-01-14 DIAGNOSIS — D539 Nutritional anemia, unspecified: Secondary | ICD-10-CM | POA: Insufficient documentation

## 2019-01-14 DIAGNOSIS — Z7982 Long term (current) use of aspirin: Secondary | ICD-10-CM | POA: Insufficient documentation

## 2019-01-14 DIAGNOSIS — D519 Vitamin B12 deficiency anemia, unspecified: Secondary | ICD-10-CM

## 2019-01-14 DIAGNOSIS — E785 Hyperlipidemia, unspecified: Secondary | ICD-10-CM | POA: Diagnosis not present

## 2019-01-14 DIAGNOSIS — E538 Deficiency of other specified B group vitamins: Secondary | ICD-10-CM | POA: Diagnosis not present

## 2019-01-14 DIAGNOSIS — D472 Monoclonal gammopathy: Secondary | ICD-10-CM

## 2019-01-14 DIAGNOSIS — Z79899 Other long term (current) drug therapy: Secondary | ICD-10-CM | POA: Diagnosis not present

## 2019-01-14 DIAGNOSIS — D518 Other vitamin B12 deficiency anemias: Secondary | ICD-10-CM

## 2019-01-14 LAB — CBC WITH DIFFERENTIAL/PLATELET
Abs Immature Granulocytes: 0.02 10*3/uL (ref 0.00–0.07)
Basophils Absolute: 0 10*3/uL (ref 0.0–0.1)
Basophils Relative: 0 %
Eosinophils Absolute: 0.1 10*3/uL (ref 0.0–0.5)
Eosinophils Relative: 1 %
HCT: 40.6 % (ref 39.0–52.0)
Hemoglobin: 14 g/dL (ref 13.0–17.0)
Immature Granulocytes: 0 %
Lymphocytes Relative: 31 %
Lymphs Abs: 2.2 10*3/uL (ref 0.7–4.0)
MCH: 34.6 pg — ABNORMAL HIGH (ref 26.0–34.0)
MCHC: 34.5 g/dL (ref 30.0–36.0)
MCV: 100.2 fL — ABNORMAL HIGH (ref 80.0–100.0)
Monocytes Absolute: 0.6 10*3/uL (ref 0.1–1.0)
Monocytes Relative: 8 %
Neutro Abs: 4.2 10*3/uL (ref 1.7–7.7)
Neutrophils Relative %: 60 %
Platelets: 295 10*3/uL (ref 150–400)
RBC: 4.05 MIL/uL — ABNORMAL LOW (ref 4.22–5.81)
RDW: 12.2 % (ref 11.5–15.5)
WBC: 7.1 10*3/uL (ref 4.0–10.5)
nRBC: 0 % (ref 0.0–0.2)

## 2019-01-14 LAB — COMPREHENSIVE METABOLIC PANEL
ALT: 28 U/L (ref 0–44)
AST: 25 U/L (ref 15–41)
Albumin: 4.2 g/dL (ref 3.5–5.0)
Alkaline Phosphatase: 64 U/L (ref 38–126)
Anion gap: 10 (ref 5–15)
BUN: 19 mg/dL (ref 8–23)
CO2: 25 mmol/L (ref 22–32)
Calcium: 9.3 mg/dL (ref 8.9–10.3)
Chloride: 107 mmol/L (ref 98–111)
Creatinine, Ser: 1.09 mg/dL (ref 0.61–1.24)
GFR calc Af Amer: >60 mL/min (ref >60)
GFR calc non Af Amer: >60 mL/min (ref >60)
Glucose, Bld: 109 mg/dL — ABNORMAL HIGH (ref 70–99)
Potassium: 4.2 mmol/L (ref 3.5–5.1)
Sodium: 142 mmol/L (ref 135–145)
Total Bilirubin: 0.8 mg/dL (ref 0.3–1.2)
Total Protein: 7.2 g/dL (ref 6.5–8.1)

## 2019-01-14 MED ORDER — CYANOCOBALAMIN 1000 MCG/ML IJ SOLN
1000.0000 ug | Freq: Once | INTRAMUSCULAR | Status: AC
  Administered 2019-01-14: 10:00:00 1000 ug via INTRAMUSCULAR

## 2019-01-14 NOTE — Progress Notes (Signed)
Medical screening examination/treatment/procedure(s) were performed by non-physician practitioner and as supervising physician I was immediately available for consultation/collaboration. I agree with above. Ayanna Gheen, MD   

## 2019-01-15 LAB — KAPPA/LAMBDA LIGHT CHAINS
Kappa free light chain: 46.5 mg/L — ABNORMAL HIGH (ref 3.3–19.4)
Kappa, lambda light chain ratio: 4.43 — ABNORMAL HIGH (ref 0.26–1.65)
Lambda free light chains: 10.5 mg/L (ref 5.7–26.3)

## 2019-01-16 LAB — MULTIPLE MYELOMA PANEL, SERUM
Albumin SerPl Elph-Mcnc: 3.9 g/dL (ref 2.9–4.4)
Albumin/Glob SerPl: 1.4 (ref 0.7–1.7)
Alpha 1: 0.2 g/dL (ref 0.0–0.4)
Alpha2 Glob SerPl Elph-Mcnc: 0.6 g/dL (ref 0.4–1.0)
B-Globulin SerPl Elph-Mcnc: 1.1 g/dL (ref 0.7–1.3)
Gamma Glob SerPl Elph-Mcnc: 1.1 g/dL (ref 0.4–1.8)
Globulin, Total: 2.9 g/dL (ref 2.2–3.9)
IgA: 300 mg/dL (ref 61–437)
IgG (Immunoglobin G), Serum: 1276 mg/dL (ref 603–1613)
IgM (Immunoglobulin M), Srm: 25 mg/dL (ref 20–172)
M Protein SerPl Elph-Mcnc: 0.5 g/dL — ABNORMAL HIGH
Total Protein ELP: 6.8 g/dL (ref 6.0–8.5)

## 2019-01-21 ENCOUNTER — Telehealth: Payer: Self-pay | Admitting: Hematology and Oncology

## 2019-01-21 ENCOUNTER — Inpatient Hospital Stay: Payer: Medicare HMO | Admitting: Hematology and Oncology

## 2019-01-21 ENCOUNTER — Encounter: Payer: Self-pay | Admitting: Hematology and Oncology

## 2019-01-21 ENCOUNTER — Other Ambulatory Visit: Payer: Self-pay

## 2019-01-21 DIAGNOSIS — D472 Monoclonal gammopathy: Secondary | ICD-10-CM | POA: Diagnosis not present

## 2019-01-21 DIAGNOSIS — Z7982 Long term (current) use of aspirin: Secondary | ICD-10-CM | POA: Diagnosis not present

## 2019-01-21 DIAGNOSIS — D518 Other vitamin B12 deficiency anemias: Secondary | ICD-10-CM

## 2019-01-21 DIAGNOSIS — Z79899 Other long term (current) drug therapy: Secondary | ICD-10-CM | POA: Diagnosis not present

## 2019-01-21 DIAGNOSIS — E538 Deficiency of other specified B group vitamins: Secondary | ICD-10-CM | POA: Diagnosis not present

## 2019-01-21 DIAGNOSIS — E785 Hyperlipidemia, unspecified: Secondary | ICD-10-CM | POA: Diagnosis not present

## 2019-01-21 DIAGNOSIS — D519 Vitamin B12 deficiency anemia, unspecified: Secondary | ICD-10-CM | POA: Diagnosis not present

## 2019-01-21 DIAGNOSIS — I10 Essential (primary) hypertension: Secondary | ICD-10-CM | POA: Diagnosis not present

## 2019-01-21 DIAGNOSIS — D539 Nutritional anemia, unspecified: Secondary | ICD-10-CM | POA: Diagnosis not present

## 2019-01-21 NOTE — Telephone Encounter (Signed)
I talk with patient regarding schedule  

## 2019-01-21 NOTE — Assessment & Plan Note (Signed)
He appears to have responded well to vitamin B12 injections through his primary care doctor I would defer to them for further management

## 2019-01-21 NOTE — Progress Notes (Signed)
[facility] OFFICE PROGRESS NOTE  Patient Care Team: Biagio Borg, MD as PCP - General Clearnce Sorrel, MD as Attending Physician (Neurology) Earnie Larsson, MD (Neurosurgery) Heath Lark, MD as Consulting Physician (Hematology and Oncology)  ASSESSMENT & PLAN:  MGUS (monoclonal gammopathy of unknown significance) Clinically, he has no signs of disease progression. Plan to see him next year in with repeat blood work, examination and blood work. The patient is up-to-date with his vaccination We discussed some of the signs and symptoms of watch out for disease progression  Macrocytic anemia with vitamin B12 deficiency He appears to have responded well to vitamin B12 injections through his primary care doctor I would defer to them for further management   Orders Placed This Encounter  Procedures  . Comprehensive metabolic panel    Standing Status:   Future    Standing Expiration Date:   02/25/2020  . CBC with Differential/Platelet    Standing Status:   Future    Standing Expiration Date:   02/25/2020  . Kappa/lambda light chains    Standing Status:   Future    Standing Expiration Date:   02/25/2020  . Multiple Myeloma Panel (SPEP&IFE w/QIG)    Standing Status:   Future    Standing Expiration Date:   02/25/2020    INTERVAL HISTORY: Please see below for problem oriented charting. He denies infection, fevers, or chills His chronic pain is stable  SUMMARY OF ONCOLOGIC HISTORY:  This is a pleasant gentleman who was diagnosed with monoclonal gammopathy of unknown significance in 2013. The patient has history of anemia. Subsequent evaluation found that the patient had B12 deficiency. He was placed on B12 replacement therapy. 24-hour urine collection x-ray and blood work did not show evidence of end organ damage and he is being observed. He also has history of prostate cancer status post resection. His follow closely with his urologist.  REVIEW OF SYSTEMS:    Constitutional: Denies fevers, chills or abnormal weight loss Eyes: Denies blurriness of vision Ears, nose, mouth, throat, and face: Denies mucositis or sore throat Respiratory: Denies cough, dyspnea or wheezes Cardiovascular: Denies palpitation, chest discomfort or lower extremity swelling Gastrointestinal:  Denies nausea, heartburn or change in bowel habits Skin: Denies abnormal skin rashes Lymphatics: Denies new lymphadenopathy or easy bruising Neurological:Denies numbness, tingling or new weaknesses Behavioral/Psych: Mood is stable, no new changes  All other systems were reviewed with the patient and are negative.  I have reviewed the past medical history, past surgical history, social history and family history with the patient and they are unchanged from previous note.  ALLERGIES:  is allergic to no known allergies.  MEDICATIONS:  Current Outpatient Medications  Medication Sig Dispense Refill  . allopurinol (ZYLOPRIM) 300 MG tablet TAKE 1 TABLET (300 MG TOTAL) BY MOUTH DAILY. 90 tablet 3  . amLODipine (NORVASC) 5 MG tablet Take 1 tablet (5 mg total) by mouth daily. 90 tablet 1  . Ascorbic Acid (VITAMIN C WITH ROSE HIPS) 1000 MG tablet Take 1 tablet (1,000 mg total) by mouth daily. 90 tablet 0  . aspirin 81 MG chewable tablet Chew 1 tablet (81 mg total) by mouth daily.    Marland Kitchen atorvastatin (LIPITOR) 40 MG tablet TAKE 1 TABLET (40 MG TOTAL) BY MOUTH DAILY. 90 tablet 3  . Cholecalciferol (VITAMIN D-3) 1000 units CAPS Take 2 capsules (2,000 Units total) by mouth daily. 180 capsule 0  . colchicine 0.6 MG tablet Take 1 tablet (0.6 mg total) by mouth 2 (two)  times daily. For 5 days when having flare. 180 tablet 1  . Cyanocobalamin (B-12 COMPLIANCE INJECTION IJ) Inject 1 Dose as directed every 30 (thirty) days.    Marland Kitchen gabapentin (NEURONTIN) 300 MG capsule TAKE 2 CAPSULES BY MOUTH 3  TIMES DAILY 180 capsule 3  . oxyCODONE (ROXICODONE) 5 MG immediate release tablet Take 1-2 tablets (5-10 mg  total) by mouth every 4 (four) hours as needed for severe pain. (Patient not taking: Reported on 05/22/2018) 40 tablet 0  . PRESCRIPTION MEDICATION 1 Syringe by Intracavernosal route every other day as needed (10-30 minutes before you have sex for ED). Matrix (Papaverine 6m Phentolamine 120mAlprostadil 4037mper ml injection)  erectile dysfunction injection every other day as needed for sex    . tiZANidine (ZANAFLEX) 4 MG tablet TAKE 1 TABLET BY MOUTH 3  TIMES DAILY AS NEEDED FOR  MUSCLE SPASM(S) 270 tablet 1   Current Facility-Administered Medications  Medication Dose Route Frequency Provider Last Rate Last Dose  . cyanocobalamin ((VITAMIN B-12)) injection 1,000 mcg  1,000 mcg Intramuscular Once JohBiagio BorgD        PHYSICAL EXAMINATION: ECOG PERFORMANCE STATUS: 0 - Asymptomatic  Vitals:   01/21/19 0919  BP: (!) 155/87  Pulse: (!) 56  Resp: 18  Temp: 98 F (36.7 C)  SpO2: 100%   Filed Weights   01/21/19 0919  Weight: 161 lb (73 kg)    GENERAL:alert, no distress and comfortable NEURO: alert & oriented x 3 with fluent speech, no focal motor/sensory deficits  LABORATORY DATA:  I have reviewed the data as listed    Component Value Date/Time   NA 142 01/14/2019 1100   NA 141 12/29/2016 1152   K 4.2 01/14/2019 1100   K 4.1 12/29/2016 1152   CL 107 01/14/2019 1100   CL 104 06/28/2012 1010   CO2 25 01/14/2019 1100   CO2 29 12/29/2016 1152   GLUCOSE 109 (H) 01/14/2019 1100   GLUCOSE 191 (H) 12/29/2016 1152   GLUCOSE 127 (H) 06/28/2012 1010   BUN 19 01/14/2019 1100   BUN 15.1 12/29/2016 1152   CREATININE 1.09 01/14/2019 1100   CREATININE 1.1 12/29/2016 1152   CALCIUM 9.3 01/14/2019 1100   CALCIUM 9.7 12/29/2016 1152   PROT 7.2 01/14/2019 1100   PROT 7.4 12/29/2016 1152   PROT 6.8 12/29/2016 1152   ALBUMIN 4.2 01/14/2019 1100   ALBUMIN 3.9 12/29/2016 1152   AST 25 01/14/2019 1100   AST 20 12/29/2016 1152   ALT 28 01/14/2019 1100   ALT 23 12/29/2016 1152   ALKPHOS  64 01/14/2019 1100   ALKPHOS 78 12/29/2016 1152   BILITOT 0.8 01/14/2019 1100   BILITOT 1.19 12/29/2016 1152   GFRNONAA >60 01/14/2019 1100   GFRAA >60 01/14/2019 1100    No results found for: SPEP, UPEP  Lab Results  Component Value Date   WBC 7.1 01/14/2019   NEUTROABS 4.2 01/14/2019   HGB 14.0 01/14/2019   HCT 40.6 01/14/2019   MCV 100.2 (H) 01/14/2019   PLT 295 01/14/2019      Chemistry      Component Value Date/Time   NA 142 01/14/2019 1100   NA 141 12/29/2016 1152   K 4.2 01/14/2019 1100   K 4.1 12/29/2016 1152   CL 107 01/14/2019 1100   CL 104 06/28/2012 1010   CO2 25 01/14/2019 1100   CO2 29 12/29/2016 1152   BUN 19 01/14/2019 1100   BUN 15.1 12/29/2016 1152   CREATININE 1.09  01/14/2019 1100   CREATININE 1.1 12/29/2016 1152      Component Value Date/Time   CALCIUM 9.3 01/14/2019 1100   CALCIUM 9.7 12/29/2016 1152   ALKPHOS 64 01/14/2019 1100   ALKPHOS 78 12/29/2016 1152   AST 25 01/14/2019 1100   AST 20 12/29/2016 1152   ALT 28 01/14/2019 1100   ALT 23 12/29/2016 1152   BILITOT 0.8 01/14/2019 1100   BILITOT 1.19 12/29/2016 1152       All questions were answered. The patient knows to call the clinic with any problems, questions or concerns. No barriers to learning was detected.  I spent 10 minutes counseling the patient face to face. The total time spent in the appointment was 15 minutes and more than 50% was on counseling and review of test results  Heath Lark, MD 01/21/2019 9:31 AM

## 2019-01-21 NOTE — Assessment & Plan Note (Signed)
Clinically, he has no signs of disease progression. Plan to see him next year in with repeat blood work, examination and blood work. The patient is up-to-date with his vaccination We discussed some of the signs and symptoms of watch out for disease progression

## 2019-02-14 ENCOUNTER — Ambulatory Visit (INDEPENDENT_AMBULATORY_CARE_PROVIDER_SITE_OTHER): Payer: Medicare HMO

## 2019-02-14 ENCOUNTER — Other Ambulatory Visit: Payer: Self-pay

## 2019-02-14 DIAGNOSIS — E538 Deficiency of other specified B group vitamins: Secondary | ICD-10-CM | POA: Diagnosis not present

## 2019-02-14 MED ORDER — CYANOCOBALAMIN 1000 MCG/ML IJ SOLN
1000.0000 ug | Freq: Once | INTRAMUSCULAR | Status: AC
  Administered 2019-02-14: 1000 ug via INTRAMUSCULAR

## 2019-02-14 NOTE — Progress Notes (Signed)
Medical screening examination/treatment/procedure(s) were performed by non-physician practitioner and as supervising physician I was immediately available for consultation/collaboration. I agree with above. Drago Hammonds, MD   

## 2019-03-15 ENCOUNTER — Ambulatory Visit (INDEPENDENT_AMBULATORY_CARE_PROVIDER_SITE_OTHER): Payer: Medicare HMO

## 2019-03-15 ENCOUNTER — Other Ambulatory Visit: Payer: Self-pay

## 2019-03-15 DIAGNOSIS — E538 Deficiency of other specified B group vitamins: Secondary | ICD-10-CM

## 2019-03-15 MED ORDER — CYANOCOBALAMIN 1000 MCG/ML IJ SOLN
1000.0000 ug | Freq: Once | INTRAMUSCULAR | Status: AC
  Administered 2019-03-15: 1000 ug via INTRAMUSCULAR

## 2019-03-15 NOTE — Progress Notes (Signed)
Medical screening examination/treatment/procedure(s) were performed by non-physician practitioner and as supervising physician I was immediately available for consultation/collaboration. I agree with above. Catori Panozzo, MD   

## 2019-04-15 ENCOUNTER — Ambulatory Visit (INDEPENDENT_AMBULATORY_CARE_PROVIDER_SITE_OTHER): Payer: Medicare HMO | Admitting: Internal Medicine

## 2019-04-15 ENCOUNTER — Encounter: Payer: Self-pay | Admitting: Internal Medicine

## 2019-04-15 VITALS — BP 116/82 | HR 77 | Temp 97.1°F | Ht 71.5 in | Wt 170.0 lb

## 2019-04-15 DIAGNOSIS — I10 Essential (primary) hypertension: Secondary | ICD-10-CM

## 2019-04-15 DIAGNOSIS — K922 Gastrointestinal hemorrhage, unspecified: Secondary | ICD-10-CM | POA: Insufficient documentation

## 2019-04-15 DIAGNOSIS — R7302 Impaired glucose tolerance (oral): Secondary | ICD-10-CM

## 2019-04-15 MED ORDER — PANTOPRAZOLE SODIUM 40 MG PO TBEC: 40.0000 mg | DELAYED_RELEASE_TABLET | Freq: Every day | ORAL | 3 refills | Status: DC

## 2019-04-15 NOTE — Assessment & Plan Note (Signed)
stable overall by history and exam, recent data reviewed with pt, and pt to continue medical treatment as before,  to f/u any worsening symptoms or concerns  

## 2019-04-15 NOTE — Assessment & Plan Note (Signed)
Low normal, asympt, to cont same tx

## 2019-04-15 NOTE — Patient Instructions (Signed)
Please take all new medication as prescribed - the protonic  Please continue all other medications as before, and refills have been done if requested.  Please have the pharmacy call with any other refills you may need.  Please keep your appointments with your specialists as you may have planned  Please go to the LAB at the blood drawing area for the tests to be done  You will be contacted by phone if any changes need to be made immediately.  Otherwise, you will receive a letter about your results with an explanation, but please check with MyChart first.  You will be contacted regarding the referral for: Gastroenterology

## 2019-04-15 NOTE — Assessment & Plan Note (Addendum)
High suspicion UGI bleeding though difficult to conferm by tele visit; pt states symptoms resolved. Declines ED or UC visit.  For protonix 40 qd, labs as ordered, and refer GI  Note:  Total time for pt hx, exam, review of record with pt in the room, determination of diagnoses and plan for further eval and tx is > 40 min, with over 50% spent in coordination and counseling of patient including the differential dx, tx, further evaluation and other management of GI bleedingm hyperglycemia, HTN

## 2019-04-15 NOTE — Progress Notes (Signed)
Patient ID: Nathan Wallace, male   DOB: 1950/05/08, 69 y.o.   MRN: [id number]  Virtual Visit via Video Note  I connected with Nathan Wallace on 04/15/19 at  2:40 PM EST by a video enabled telemedicine application and verified that I am speaking with the correct person using two identifiers.  Location: Patient: at home Provider: at office   I discussed the limitations of evaluation and management by telemedicine and the availability of in person appointments. The patient expressed understanding and agreed to proceed.  History of Present Illness: 69yo M presents with co fr syncopal episode 3 days ago, very brief "1 second" with some dizziness after and several episodes black stool over that day, but denied abd pain, n/v, wt loss or low appetite or BRB.  Competely denies symptoms of dizzy, weak, black stools, abd pain or syncope since then and refuses ED or UC eval.  Not taking PPI, no recent increased ETOH or nsaid use.  BP this am 117.87.  Pt denies chest pain, increased sob or doe, wheezing, orthopnea, PND, increased LE swelling, palpitations, Has appt on Wed this week for b12, asks for lab f/U at that time.  Lat EGD 2010, last colonscopy 2016   Pt denies polydipsia, polyuria Past Medical History:  Diagnosis Date  . AKI (acute kidney injury) ([place])   . Arthritis    RT KNEE AND RT SHOULDER  . B12 deficiency   . Benign head tremor   . Chest tightness    comes and goes   . CVA (cerebrovascular accident due to intracerebral hemorrhage) ([place]) 08/03/2011  . Degenerative joint disease of knee, right   . Depression 07/26/2012   no problems now  . GERD (gastroesophageal reflux disease)    NO RECENT PROBLEMS AND NO MEDS  . History of blood transfusion   . History of kidney stones   . History of urinary tract infection   . Hyperlipidemia    border line  . Hypertension   . Impaired glucose tolerance 06/22/2011  . Left inguinal hernia   . MGUS (monoclonal gammopathy of unknown significance)   .  Prostate cancer ([place]) 2011  . Rotator cuff tear arthropathy of right shoulder, with instability 10/25/2016  . Sepsis ([place])   . Stress incontinence   . Stroke [facility])    2013  . Syncope 2012   after prostate surgery; neg neurology work up.    Past Surgical History:  Procedure Laterality Date  . COLONOSCOPY    . INGUINAL HERNIA REPAIR  06/01/2011   Procedure: HERNIA REPAIR INGUINAL ADULT;  Surgeon: Adin Hector, MD;  Location: [facility];  Service: General;  Laterality: Left;  Left Inguinal Hernia Repair with Mesh  . ROBOT ASSISTED LAPAROSCOPIC RADICAL PROSTATECTOMY  jan 2012   da vinci  . SPINE SURGERY  2003 OR 2004   CERVICAL FUSION-STATES LIMITED ROM NECK  . TOTAL SHOULDER ARTHROPLASTY Right 10/25/2016  . TOTAL SHOULDER ARTHROPLASTY Right 10/25/2016   Procedure: RIGHT TOTAL SHOULDER ARTHROPLASTY;  Surgeon: Marchia Bond, MD;  Location: [place];  Service: Orthopedics;  Laterality: Right;  . URETHRAL SLING N/A 03/08/2016   Procedure: MALE SLING CYSTOSCOPY;  Surgeon: Bjorn Loser, MD;  Location: [facility];  Service: Urology;  Laterality: N/A;  . WRIST SURGERY     right     reports that he quit smoking about 52 years ago. His smoking use included cigarettes. He has a 0.50 pack-year smoking history. He has never used smokeless tobacco. He reports current alcohol use. He reports  current drug use. Drug: Marijuana. family history includes Aneurysm in his mother; Cancer in his father; Colon cancer in his father; Heart attack in his brother; Hypertension in his mother. Allergies  Allergen Reactions  . No Known Allergies    Current Outpatient Medications on File Prior to Visit  Medication Sig Dispense Refill  . allopurinol (ZYLOPRIM) 300 MG tablet TAKE 1 TABLET (300 MG TOTAL) BY MOUTH DAILY. 90 tablet 3  . amLODipine (NORVASC) 5 MG tablet Take 1 tablet (5 mg total) by mouth daily. 90 tablet 1  . Ascorbic Acid (VITAMIN C WITH ROSE HIPS) 1000 MG tablet Take 1 tablet (1,000 mg total) by mouth  daily. 90 tablet 0  . aspirin 81 MG chewable tablet Chew 1 tablet (81 mg total) by mouth daily.    Marland Kitchen atorvastatin (LIPITOR) 40 MG tablet TAKE 1 TABLET (40 MG TOTAL) BY MOUTH DAILY. 90 tablet 3  . Cholecalciferol (VITAMIN D-3) 1000 units CAPS Take 2 capsules (2,000 Units total) by mouth daily. 180 capsule 0  . Cyanocobalamin (B-12 COMPLIANCE INJECTION IJ) Inject 1 Dose as directed every 30 (thirty) days.    Marland Kitchen gabapentin (NEURONTIN) 300 MG capsule TAKE 2 CAPSULES BY MOUTH 3  TIMES DAILY 180 capsule 3  . PRESCRIPTION MEDICATION 1 Syringe by Intracavernosal route every other day as needed (10-30 minutes before you have sex for ED). Matrix (Papaverine 30mg  Phentolamine 1mg  Alprostadil 21mcg per ml injection)  erectile dysfunction injection every other day as needed for sex    . colchicine 0.6 MG tablet Take 1 tablet (0.6 mg total) by mouth 2 (two) times daily. For 5 days when having flare. (Patient not taking: Reported on 04/15/2019) 180 tablet 1  . tiZANidine (ZANAFLEX) 4 MG tablet TAKE 1 TABLET BY MOUTH 3  TIMES DAILY AS NEEDED FOR  MUSCLE SPASM(S) (Patient not taking: Reported on 04/15/2019) 270 tablet 1   Current Facility-Administered Medications on File Prior to Visit  Medication Dose Route Frequency Provider Last Rate Last Admin  . cyanocobalamin ((VITAMIN B-12)) injection 1,000 mcg  1,000 mcg Intramuscular Once Biagio Borg, MD        Observations/Objective: Alert, NAD, appropriate mood and affect, resps normal, cn 2-12 intact, moves all 4s, no visible rash or swelling Lab Results  Component Value Date   WBC 7.1 01/14/2019   HGB 14.0 01/14/2019   HCT 40.6 01/14/2019   PLT 295 01/14/2019   GLUCOSE 109 (H) 01/14/2019   CHOL 122 05/18/2017   TRIG 133.0 05/18/2017   HDL 40.40 05/18/2017   LDLDIRECT 87.9 12/19/2011   LDLCALC 55 05/18/2017   ALT 28 01/14/2019   AST 25 01/14/2019   NA 142 01/14/2019   K 4.2 01/14/2019   CL 107 01/14/2019   CREATININE 1.09 01/14/2019   BUN 19 01/14/2019    CO2 25 01/14/2019   TSH 1.85 05/18/2017   PSA 0.07 (L) 09/23/2015   INR 1.12 03/02/2016   HGBA1C 5.2 05/22/2018   Assessment and Plan: See notes  Follow Up Instructions: Seen otes   I discussed the assessment and treatment plan with the patient. The patient was provided an opportunity to ask questions and all were answered. The patient agreed with the plan and demonstrated an understanding of the instructions.   The patient was advised to call back or seek an in-person evaluation if the symptoms worsen or if the condition fails to improve as anticipated.   Cathlean Cower, MD

## 2019-04-16 ENCOUNTER — Encounter: Payer: Self-pay | Admitting: *Deleted

## 2019-04-16 ENCOUNTER — Telehealth: Payer: Self-pay

## 2019-04-16 DIAGNOSIS — E538 Deficiency of other specified B group vitamins: Secondary | ICD-10-CM

## 2019-04-16 NOTE — Addendum Note (Signed)
Addended by: Biagio Borg on: 04/16/2019 07:20 PM   Modules accepted: Orders

## 2019-04-16 NOTE — Telephone Encounter (Signed)
Patient is coming tomorrow to have lab work, wanted to ask if dr Jenny Reichmann would add a vitamin B12 lab to also be drawn---last b12 lab was 2018---routing to dr Jenny Reichmann, are you ok with adding this lab request?  Please advise, thanks

## 2019-04-16 NOTE — Telephone Encounter (Signed)
Ok this is added 

## 2019-04-17 ENCOUNTER — Ambulatory Visit (INDEPENDENT_AMBULATORY_CARE_PROVIDER_SITE_OTHER): Payer: Medicare HMO

## 2019-04-17 ENCOUNTER — Other Ambulatory Visit: Payer: Self-pay

## 2019-04-17 DIAGNOSIS — E538 Deficiency of other specified B group vitamins: Secondary | ICD-10-CM

## 2019-04-17 DIAGNOSIS — K922 Gastrointestinal hemorrhage, unspecified: Secondary | ICD-10-CM | POA: Diagnosis not present

## 2019-04-17 LAB — CBC WITH DIFFERENTIAL/PLATELET
Basophils Absolute: 0 10*3/uL (ref 0.0–0.1)
Basophils Relative: 0.3 % (ref 0.0–3.0)
Eosinophils Absolute: 0.1 10*3/uL (ref 0.0–0.7)
Eosinophils Relative: 1.2 % (ref 0.0–5.0)
HCT: 24.4 % — ABNORMAL LOW (ref 39.0–52.0)
Hemoglobin: 8.5 g/dL — ABNORMAL LOW (ref 13.0–17.0)
Lymphocytes Relative: 24.5 % (ref 12.0–46.0)
Lymphs Abs: 1.9 10*3/uL (ref 0.7–4.0)
MCHC: 34.7 g/dL (ref 30.0–36.0)
MCV: 104.4 fl — ABNORMAL HIGH (ref 78.0–100.0)
Monocytes Absolute: 0.6 10*3/uL (ref 0.1–1.0)
Monocytes Relative: 8.2 % (ref 3.0–12.0)
Neutro Abs: 5.2 10*3/uL (ref 1.4–7.7)
Neutrophils Relative %: 65.8 % (ref 43.0–77.0)
Platelets: 292 10*3/uL (ref 150.0–400.0)
RBC: 2.34 Mil/uL — ABNORMAL LOW (ref 4.22–5.81)
RDW: 13.2 % (ref 11.5–15.5)
WBC: 7.8 10*3/uL (ref 4.0–10.5)

## 2019-04-17 LAB — HEPATIC FUNCTION PANEL
ALT: 28 U/L (ref 0–53)
AST: 26 U/L (ref 0–37)
Albumin: 4 g/dL (ref 3.5–5.2)
Alkaline Phosphatase: 51 U/L (ref 39–117)
Bilirubin, Direct: 0.2 mg/dL (ref 0.0–0.3)
Total Bilirubin: 0.5 mg/dL (ref 0.2–1.2)
Total Protein: 6.4 g/dL (ref 6.0–8.3)

## 2019-04-17 LAB — BASIC METABOLIC PANEL
BUN: 12 mg/dL (ref 6–23)
CO2: 29 mEq/L (ref 19–32)
Calcium: 9 mg/dL (ref 8.4–10.5)
Chloride: 107 mEq/L (ref 96–112)
Creatinine, Ser: 1.04 mg/dL (ref 0.40–1.50)
GFR: 85.59 mL/min (ref >60.00)
Glucose, Bld: 103 mg/dL — ABNORMAL HIGH (ref 70–99)
Potassium: 3.7 mEq/L (ref 3.5–5.1)
Sodium: 141 mEq/L (ref 135–145)

## 2019-04-17 LAB — IBC PANEL
Iron: 55 ug/dL (ref 42–165)
Saturation Ratios: 14 % — ABNORMAL LOW (ref 20.0–50.0)
Transferrin: 281 mg/dL (ref 212.0–360.0)

## 2019-04-17 LAB — VITAMIN B12: Vitamin B-12: 425 pg/mL (ref 211–911)

## 2019-04-17 LAB — FERRITIN: Ferritin: 61.4 ng/mL (ref 22.0–322.0)

## 2019-04-17 MED ORDER — CYANOCOBALAMIN 1000 MCG/ML IJ SOLN
1000.0000 ug | Freq: Once | INTRAMUSCULAR | Status: AC
  Administered 2019-04-17: 11:00:00 1000 ug via INTRAMUSCULAR

## 2019-04-17 NOTE — Progress Notes (Signed)
Medical screening examination/treatment/procedure(s) were performed by non-physician practitioner and as supervising physician I was immediately available for consultation/collaboration. I agree with above. Makaylen Thieme, MD   

## 2019-04-18 ENCOUNTER — Other Ambulatory Visit: Payer: Self-pay | Admitting: Internal Medicine

## 2019-04-18 MED ORDER — POLYSACCHARIDE IRON COMPLEX 150 MG PO CAPS: 150.0000 mg | ORAL_CAPSULE | Freq: Every day | ORAL | 1 refills | Status: DC

## 2019-04-19 ENCOUNTER — Telehealth: Payer: Self-pay | Admitting: Internal Medicine

## 2019-04-19 NOTE — Telephone Encounter (Signed)
Pt was told several times that the medication is to help treat ulcers too, not sure why he is not understanding

## 2019-04-19 NOTE — Telephone Encounter (Signed)
Copied from [place] [id number]. Topic: General - Other >> Apr 19, 2019  1:17 PM Keene Breath wrote: Reason for CRM: Patient called to ask the doctor why he was prescribed medication, pantoprazole (PROTONIX) 40 MG tablet for reflux.  Patient said he does not have reflux.  Please call to discuss at [phone]

## 2019-04-22 NOTE — Telephone Encounter (Signed)
Pt informed of below and verbalized understanding.  

## 2019-04-23 ENCOUNTER — Encounter: Payer: Self-pay | Admitting: Gastroenterology

## 2019-04-23 ENCOUNTER — Other Ambulatory Visit (INDEPENDENT_AMBULATORY_CARE_PROVIDER_SITE_OTHER): Payer: Medicare HMO

## 2019-04-23 ENCOUNTER — Ambulatory Visit: Payer: Medicare HMO | Admitting: Gastroenterology

## 2019-04-23 VITALS — BP 120/70 | HR 68 | Temp 98.4°F | Ht 68.0 in | Wt 161.2 lb

## 2019-04-23 DIAGNOSIS — K921 Melena: Secondary | ICD-10-CM

## 2019-04-23 DIAGNOSIS — Z01818 Encounter for other preprocedural examination: Secondary | ICD-10-CM | POA: Diagnosis not present

## 2019-04-23 DIAGNOSIS — Z800 Family history of malignant neoplasm of digestive organs: Secondary | ICD-10-CM | POA: Diagnosis not present

## 2019-04-23 DIAGNOSIS — D649 Anemia, unspecified: Secondary | ICD-10-CM

## 2019-04-23 LAB — CBC WITH DIFFERENTIAL/PLATELET
Basophils Absolute: 0 10*3/uL (ref 0.0–0.1)
Basophils Relative: 0.5 % (ref 0.0–3.0)
Eosinophils Absolute: 0.1 10*3/uL (ref 0.0–0.7)
Eosinophils Relative: 1.4 % (ref 0.0–5.0)
HCT: 30.6 % — ABNORMAL LOW (ref 39.0–52.0)
Hemoglobin: 10.2 g/dL — ABNORMAL LOW (ref 13.0–17.0)
Lymphocytes Relative: 30.2 % (ref 12.0–46.0)
Lymphs Abs: 2 10*3/uL (ref 0.7–4.0)
MCHC: 33.3 g/dL (ref 30.0–36.0)
MCV: 104.3 fl — ABNORMAL HIGH (ref 78.0–100.0)
Monocytes Absolute: 0.6 10*3/uL (ref 0.1–1.0)
Monocytes Relative: 9.1 % (ref 3.0–12.0)
Neutro Abs: 3.8 10*3/uL (ref 1.4–7.7)
Neutrophils Relative %: 58.8 % (ref 43.0–77.0)
Platelets: 444 10*3/uL — ABNORMAL HIGH (ref 150.0–400.0)
RBC: 2.93 Mil/uL — ABNORMAL LOW (ref 4.22–5.81)
RDW: 15.4 % (ref 11.5–15.5)
WBC: 6.5 10*3/uL (ref 4.0–10.5)

## 2019-04-23 MED ORDER — SUPREP BOWEL PREP KIT 17.5-3.13-1.6 GM/177ML PO SOLN: 1.0000 | ORAL | 0 refills | Status: DC

## 2019-04-23 NOTE — Patient Instructions (Addendum)
You have been scheduled for an endoscopy and colonoscopy. Please follow the written instructions given to you at your visit today. Please pick up your prep supplies at the pharmacy within the next 1-3 days. If you use inhalers (even only as needed), please bring them with you on the day of your procedure. ________________________________________________________________ Your provider has requested that you go to the basement level for lab work before leaving today. Press "B" on the elevator. The lab is located at the first door on the left as you exit the elevator. ________________________________________________________________ If you are age 39 or older, your body mass index should be between 23-30. Your Body mass index is 24.52 kg/m. If this is out of the aforementioned range listed, please consider follow up with your Primary Care Provider.  If you are age 58 or younger, your body mass index should be between 19-25. Your Body mass index is 24.52 kg/m. If this is out of the aformentioned range listed, please consider follow up with your Primary Care Provider.  ________________________________________________________________ Due to recent changes in healthcare laws, you may see the results of your imaging and laboratory studies on MyChart before your provider has had a chance to review them.  We understand that in some cases there may be results that are confusing or concerning to you. Not all laboratory results come back in the same time frame and the provider may be waiting for multiple results in order to interpret others.  Please give [place] 48 hours in order for your provider to thoroughly review all the results before contacting the office for clarification of your results.

## 2019-04-23 NOTE — Progress Notes (Signed)
04/23/2019 Nathan Wallace 03-10-1951   HISTORY OF PRESENT ILLNESS:  This is a 69 year old male who was previously a patient of Dr. Kelby Fam.  He has a family history of colon cancer in his father.  Last colonoscopy was 05/2014 with diverticulosis only.  He is here today at the request of his PCP, Dr. Jenny Reichmann, for evaluation regarding black stools.  He tells me that on New Year's Day he had some abdominal discomfort that was followed by a large black bowel movement.  Also felt somewhat dizzy but he took his vitals at home and all was normal.  The abdominal discomfort resolved after having the bowel movement and the black stools only occurred on that one occasion, promptly returning to normal after that.  He saw his PCP for this issue and hemoglobin was checked.  It was found to be 8.5 g as compared to 14 g just 3 months ago.  Vitamin B12 level is normal.  Iron studies were normal except for a low iron saturation at 14%.  It was recommended that he start iron supplement in the form of iron polysaccharide 150 mg daily, but he has not yet received that from his pharmacy.  He is also placed on pantoprazole 40 mg daily for ulcer prophylaxis.  He says that otherwise he feels fine.  He denies any other issues moving his bowels and denies seeing any red blood in his stools.  Has history of MGUS.  Follows with Dr. Alvy Bimler and at appt in October everything was stable.     Past Medical History:  Diagnosis Date  . AKI (acute kidney injury) ([place])   . Arthritis    RT KNEE AND RT SHOULDER  . B12 deficiency   . Benign head tremor   . Chest tightness    comes and goes   . CVA (cerebrovascular accident due to intracerebral hemorrhage) ([place]) 08/03/2011  . Degenerative joint disease of knee, right   . Depression 07/26/2012   no problems now  . Diverticulosis   . GERD (gastroesophageal reflux disease)    NO RECENT PROBLEMS AND NO MEDS  . History of blood transfusion   . History of kidney stones   .  History of urinary tract infection   . Hyperlipidemia    border line  . Hypertension   . Impaired glucose tolerance 06/22/2011  . Left inguinal hernia   . Macrocytic anemia   . MGUS (monoclonal gammopathy of unknown significance)   . Prostate cancer ([place]) 2011  . Rotator cuff tear arthropathy of right shoulder, with instability 10/25/2016  . Sepsis ([place])   . Stress incontinence   . Stroke [facility])    2013  . Syncope 2012   after prostate surgery; neg neurology work up.    Past Surgical History:  Procedure Laterality Date  . COLONOSCOPY    . INGUINAL HERNIA REPAIR  06/01/2011   Procedure: HERNIA REPAIR INGUINAL ADULT;  Surgeon: Adin Hector, MD;  [facility];  Service: General;  Laterality: Left;  Left Inguinal Hernia Repair with Mesh  . ROBOT ASSISTED LAPAROSCOPIC RADICAL PROSTATECTOMY  jan 2012   da vinci  . SPINE SURGERY  2003 OR 2004   CERVICAL FUSION-STATES LIMITED ROM NECK  . TOTAL SHOULDER ARTHROPLASTY Right 10/25/2016  . TOTAL SHOULDER ARTHROPLASTY Right 10/25/2016   Procedure: RIGHT TOTAL SHOULDER ARTHROPLASTY;  Surgeon: Marchia Bond, MD;  Location: [place];  Service: Orthopedics;  Laterality: Right;  . URETHRAL SLING N/A 03/08/2016  Procedure: MALE SLING CYSTOSCOPY;  Surgeon: Bjorn Loser, MD;  [facility];  Service: Urology;  Laterality: N/A;  . WRIST SURGERY     right     reports that he quit smoking about 52 years ago. His smoking use included cigarettes. He has a 0.50 pack-year smoking history. He has never used smokeless tobacco. He reports current alcohol use. He reports current drug use. Drug: Marijuana. family history includes Aneurysm in his mother; Colon cancer (age of onset: 14) in his father; Heart attack in his brother; Hypertension in his mother. Allergies  Allergen Reactions  . No Known Allergies       Outpatient Encounter Medications as of 04/23/2019  Medication Sig  . allopurinol (ZYLOPRIM) 300 MG tablet TAKE 1 TABLET (300 MG TOTAL) BY  MOUTH DAILY.  Marland Kitchen amLODipine (NORVASC) 5 MG tablet Take 1 tablet (5 mg total) by mouth daily.  . Ascorbic Acid (VITAMIN C WITH ROSE HIPS) 1000 MG tablet Take 1 tablet (1,000 mg total) by mouth daily.  Marland Kitchen aspirin 81 MG chewable tablet Chew 1 tablet (81 mg total) by mouth daily.  Marland Kitchen atorvastatin (LIPITOR) 40 MG tablet TAKE 1 TABLET (40 MG TOTAL) BY MOUTH DAILY.  Marland Kitchen Cholecalciferol (VITAMIN D-3) 1000 units CAPS Take 2 capsules (2,000 Units total) by mouth daily.  . colchicine 0.6 MG tablet Take 1 tablet (0.6 mg total) by mouth 2 (two) times daily. For 5 days when having flare.  . Cyanocobalamin (B-12 COMPLIANCE INJECTION IJ) Inject 1 Dose as directed every 30 (thirty) days.  . pantoprazole (PROTONIX) 40 MG tablet Take 1 tablet (40 mg total) by mouth daily.  Marland Kitchen PRESCRIPTION MEDICATION 1 Syringe by Intracavernosal route every other day as needed (10-30 minutes before you have sex for ED). Matrix (Papaverine 30mg  Phentolamine 1mg  Alprostadil 70mcg per ml injection)  erectile dysfunction injection every other day as needed for sex  . tiZANidine (ZANAFLEX) 4 MG tablet TAKE 1 TABLET BY MOUTH 3  TIMES DAILY AS NEEDED FOR  MUSCLE SPASM(S)  . gabapentin (NEURONTIN) 300 MG capsule TAKE 2 CAPSULES BY MOUTH 3  TIMES DAILY (Patient not taking: Reported on 04/23/2019)  . iron polysaccharides (NU-IRON) 150 MG capsule Take 1 capsule (150 mg total) by mouth daily. (Patient not taking: Reported on 04/23/2019)   Facility-Administered Encounter Medications as of 04/23/2019  Medication  . cyanocobalamin ((VITAMIN B-12)) injection 1,000 mcg     REVIEW OF SYSTEMS  : All other systems reviewed and negative except where noted in the History of Present Illness.   PHYSICAL EXAM: BP 120/70 (BP Location: Left Arm, Patient Position: Sitting, Cuff Size: Normal)   Pulse 68   Temp 98.4 F (36.9 C)   Ht 5\' 8"  (1.727 m) Comment: height measured without shoes  Wt 161 lb 4 oz (73.1 kg)   BMI 24.52 kg/m  General: Well developed black  male in no acute distress Head: Normocephalic and atraumatic Eyes:  Sclerae anicteric, conjunctiva pink. Ears: Normal auditory acuity Lungs: Clear throughout to auscultation; no increased WOB Heart: Regular rate and rhythm; no M/R/G. Abdomen: Soft, non-distended.  BS present.  Non-tender. Rectal:  Will be done at the time of colonoscopy. Musculoskeletal: Symmetrical with no gross deformities  Skin: No lesions on visible extremities Extremities: No edema  Neurological: Alert oriented x 4, grossly non-focal Psychological:  Alert and cooperative. Normal mood and affect  ASSESSMENT AND PLAN: *Family history of colon cancer in father:  Last colonoscopy 05/2014.  Will schedule with Dr. Loletha Carrow. *Black stool, anemia:  One  episode with large black stool on New Year's Day.  None sine, but Hgb down to 8.5 grams from 14 grams just 3 months ago.  Due for colonoscopy as above but needs EGD as well.  Will schedule with Dr. Loletha Carrow.  Continue pantoprazole 40 mg daily for now as recently prescribed by his PCP.  Will repeat CBC today. *History of MGUS:  Follows with Dr. Alvy Bimler.  Last appt in October everything was stable.  **The risks, benefits, and alternatives to EGD and colonoscopy were discussed with the patient and he consents to proceed.   CC:  Nathan Borg, MD

## 2019-04-23 NOTE — Progress Notes (Signed)
____________________________________________________________  Attending physician addendum:  Thank you for sending this case to me. I have reviewed the entire note, and the outlined plan seems appropriate.  His hemoglobin is up to 10.2 today, so the iron is working.  Aspirin is listed on his chronic medications.  Please have nursing call him and stop it if he has not already done so.  Wilfrid Lund, MD  ____________________________________________________________

## 2019-04-24 ENCOUNTER — Telehealth: Payer: Self-pay

## 2019-04-24 NOTE — Telephone Encounter (Signed)
-----   Message from Loralie Champagne, PA-C sent at 04/23/2019  5:16 PM EST ----- I will have Nathan Wallace call to let him know to hold his aspirin.  He has not yet received iron supplements from his pharmacy.  [name],  Please call the patient and ask him to hold his ASA for now.  Also, let him know that his hemoglobin has increased to 10.2 g.  ----- Message ----- From: Doran Stabler, MD Sent: 04/23/2019   4:16 PM EST To: Loralie Champagne, PA-C    ----- Message ----- From: Loralie Champagne, PA-C Sent: 04/23/2019   1:04 PM EST To: Doran Stabler, MD

## 2019-04-24 NOTE — Telephone Encounter (Signed)
The patient has been notified of this information and all questions answered. He will stop ASA for now.

## 2019-04-30 ENCOUNTER — Encounter: Payer: Self-pay | Admitting: Internal Medicine

## 2019-05-01 DIAGNOSIS — C61 Malignant neoplasm of prostate: Secondary | ICD-10-CM | POA: Diagnosis not present

## 2019-05-06 ENCOUNTER — Ambulatory Visit (INDEPENDENT_AMBULATORY_CARE_PROVIDER_SITE_OTHER): Payer: Medicare HMO

## 2019-05-06 ENCOUNTER — Other Ambulatory Visit: Payer: Self-pay

## 2019-05-06 ENCOUNTER — Other Ambulatory Visit: Payer: Self-pay | Admitting: Gastroenterology

## 2019-05-06 DIAGNOSIS — Z1159 Encounter for screening for other viral diseases: Secondary | ICD-10-CM

## 2019-05-06 LAB — SARS CORONAVIRUS 2 (TAT 6-24 HRS): SARS Coronavirus 2: NEGATIVE

## 2019-05-08 DIAGNOSIS — N5201 Erectile dysfunction due to arterial insufficiency: Secondary | ICD-10-CM | POA: Diagnosis not present

## 2019-05-08 DIAGNOSIS — C61 Malignant neoplasm of prostate: Secondary | ICD-10-CM | POA: Diagnosis not present

## 2019-05-08 DIAGNOSIS — N393 Stress incontinence (female) (male): Secondary | ICD-10-CM | POA: Diagnosis not present

## 2019-05-09 ENCOUNTER — Other Ambulatory Visit: Payer: Self-pay

## 2019-05-09 ENCOUNTER — Ambulatory Visit (AMBULATORY_SURGERY_CENTER): Payer: Medicare HMO | Admitting: Gastroenterology

## 2019-05-09 ENCOUNTER — Encounter: Payer: Self-pay | Admitting: Gastroenterology

## 2019-05-09 VITALS — BP 104/76 | HR 63 | Temp 97.1°F | Resp 18 | Ht 68.0 in | Wt 161.0 lb

## 2019-05-09 DIAGNOSIS — K5730 Diverticulosis of large intestine without perforation or abscess without bleeding: Secondary | ICD-10-CM

## 2019-05-09 DIAGNOSIS — B9681 Helicobacter pylori [H. pylori] as the cause of diseases classified elsewhere: Secondary | ICD-10-CM | POA: Diagnosis not present

## 2019-05-09 DIAGNOSIS — I10 Essential (primary) hypertension: Secondary | ICD-10-CM | POA: Diagnosis not present

## 2019-05-09 DIAGNOSIS — K2950 Unspecified chronic gastritis without bleeding: Secondary | ICD-10-CM | POA: Diagnosis not present

## 2019-05-09 DIAGNOSIS — K635 Polyp of colon: Secondary | ICD-10-CM

## 2019-05-09 DIAGNOSIS — D125 Benign neoplasm of sigmoid colon: Secondary | ICD-10-CM

## 2019-05-09 DIAGNOSIS — D500 Iron deficiency anemia secondary to blood loss (chronic): Secondary | ICD-10-CM

## 2019-05-09 DIAGNOSIS — K921 Melena: Secondary | ICD-10-CM | POA: Diagnosis not present

## 2019-05-09 DIAGNOSIS — K259 Gastric ulcer, unspecified as acute or chronic, without hemorrhage or perforation: Secondary | ICD-10-CM

## 2019-05-09 DIAGNOSIS — D649 Anemia, unspecified: Secondary | ICD-10-CM | POA: Diagnosis not present

## 2019-05-09 MED ORDER — SODIUM CHLORIDE 0.9 % IV SOLN: 500.0000 mL | Freq: Once | INTRAVENOUS | Status: DC

## 2019-05-09 NOTE — Progress Notes (Signed)
Temp by JB.  Vitals by KA.

## 2019-05-09 NOTE — Op Note (Signed)
[place] Patient Name: Nathan Wallace Procedure Date: 05/09/2019 2:12 PM MRN: [id number] Endoscopist: Mallie Mussel L. Loletha Carrow , MD Age: 69 Referring MD:  Date of Birth: 27-Dec-1950 Gender: Male Account #: [id number] Procedure:                Upper GI endoscopy Indications:              Iron deficiency anemia secondary to chronic blood                            loss, Melena (one episode) Medicines:                Monitored Anesthesia Care Procedure:                Pre-Anesthesia Assessment:                           - Prior to the procedure, a History and Physical                            was performed, and patient medications and                            allergies were reviewed. The patient's tolerance of                            previous anesthesia was also reviewed. The risks                            and benefits of the procedure and the sedation                            options and risks were discussed with the patient.                            All questions were answered, and informed consent                            was obtained. Prior Anticoagulants: The patient has                            taken no previous anticoagulant or antiplatelet                            agents. ASA Grade Assessment: III - A patient with                            severe systemic disease. After reviewing the risks                            and benefits, the patient was deemed in                            satisfactory condition to undergo the procedure.  After obtaining informed consent, the endoscope was                            passed under direct vision. Throughout the                            procedure, the patient's blood pressure, pulse, and                            oxygen saturations were monitored continuously. The                            Endoscope was introduced through the mouth, and                            advanced to the second part  of duodenum. The upper                            GI endoscopy was accomplished without difficulty.                            The patient tolerated the procedure well. Scope In: Scope Out: Findings:                 The larynx was normal.                           The esophagus was normal.                           One non-bleeding superficial gastric ulcer with no                            stigmata of bleeding was found in the cardia. The                            lesion was 8 mm in largest dimension. Biopsies were                            taken with a cold forceps for histology                           Patchy mildly erythematous mucosa was found in the                            gastric fundus.                           Two non-bleeding linear and superficial gastric                            ulcers with no stigmata of bleeding were found in                            the prepyloric region of  the stomach (almost                            completely healed). The largest lesion was 5 mm in                            largest dimension. Several biopsies were obtained                            in the gastric body and in the gastric antrum with                            cold forceps for histology.                           The cardia and gastric fundus were normal on                            retroflexion.                           The examined duodenum was normal. Complications:            No immediate complications. Estimated Blood Loss:     Estimated blood loss was minimal. Impression:               - Normal larynx.                           - Normal esophagus.                           - Non-bleeding gastric ulcer with no stigmata of                            bleeding. Biopsied.                           - Erythematous mucosa in the gastric fundus.                           - Non-bleeding gastric ulcers with no stigmata of                            bleeding.                            - Normal examined duodenum.                           - Several biopsies were obtained in the gastric                            body and in the gastric antrum.                           Ulcers appear to have caused the recent overt GI  bleeding, and they are nearly healed. Recommendation:           - Patient has a contact number available for                            emergencies. The signs and symptoms of potential                            delayed complications were discussed with the                            patient. Return to normal activities tomorrow.                            Written discharge instructions were provided to the                            patient.                           - Resume previous diet.                           - Continue present medications. Continue                            pantoprazole 40 mg once daily for another 4 weeks                            (see below)                           - Await pathology results.                           - Return to primary care physician in 3-4 weeks. At                            that time, have hemoglobin rechecked and discuss                            long term need for aspirin. If aspirin is strongly                            indicated and is to be resumed, patient should stay                            on long term low dose PPI. [name] L. Loletha Carrow, MD 05/09/2019 3:07:07 PM This report has been signed electronically.

## 2019-05-09 NOTE — Progress Notes (Signed)
Called to room to assist during endoscopic procedure.  Patient ID and intended procedure confirmed with present staff. Received instructions for my participation in the procedure from the performing physician.  

## 2019-05-09 NOTE — Op Note (Signed)
[facility] Patient Name: Nathan Wallace Procedure Date: 05/09/2019 2:12 PM MRN: [id number] Endoscopist: Mallie Mussel L. Loletha Carrow , MD Age: 69 Referring MD:  Date of Birth: 1951-03-03 Gender: Male Account #: [id number] Procedure:                Colonoscopy Indications:              Melena (one episode), Iron deficiency anemia                            secondary to chronic blood loss - lately improved                            on oral iron. has been off aspirin 2 weeks Medicines:                Monitored Anesthesia Care Procedure:                Pre-Anesthesia Assessment:                           - Prior to the procedure, a History and Physical                            was performed, and patient medications and                            allergies were reviewed. The patient's tolerance of                            previous anesthesia was also reviewed. The risks                            and benefits of the procedure and the sedation                            options and risks were discussed with the patient.                            All questions were answered, and informed consent                            was obtained. Prior Anticoagulants: The patient has                            taken no previous anticoagulant or antiplatelet                            agents. ASA Grade Assessment: III - A patient with                            severe systemic disease. After reviewing the risks                            and benefits, the patient was deemed in  satisfactory condition to undergo the procedure.                           After obtaining informed consent, the colonoscope                            was passed under direct vision. Throughout the                            procedure, the patient's blood pressure, pulse, and                            oxygen saturations were monitored continuously. The                            Colonoscope was  introduced through the anus and                            advanced to the the cecum, identified by                            appendiceal orifice and ileocecal valve. The                            colonoscopy was somewhat difficult due to a                            redundant colon. Successful completion of the                            procedure was aided by using manual pressure. The                            patient tolerated the procedure well. The quality                            of the bowel preparation was good. The ileocecal                            valve, appendiceal orifice, and rectum were                            photographed. The quality of the bowel preparation                            was evaluated using the BBPS [facility] Bowel                            Preparation Scale) with scores of: Right Colon = 2,                            Transverse Colon = 2 and Left Colon = 2. The total  BBPS score equals 6. Scope In: 2:22:47 PM Scope Out: 2:41:26 PM Scope Withdrawal Time: 0 hours 12 minutes 17 seconds  Total Procedure Duration: 0 hours 18 minutes 39 seconds  Findings:                 The perianal and digital rectal examinations were                            normal.                           A diminutive polyp was found in the distal rectum.                            The polyp was sessile. The polyp was removed with a                            cold snare. Resection and retrieval were complete.                           Multiple diverticula were found in the left colon.                           The exam was otherwise without abnormality on                            direct and retroflexion views. Complications:            No immediate complications. Estimated Blood Loss:     Estimated blood loss was minimal. Impression:               - One diminutive polyp in the distal rectum,                            removed with a cold snare.  Resected and retrieved.                           - Diverticulosis in the left colon.                           - The examination was otherwise normal on direct                            and retroflexion views. Recommendation:           - Patient has a contact number available for                            emergencies. The signs and symptoms of potential                            delayed complications were discussed with the                            patient. Return to normal activities tomorrow.  Written discharge instructions were provided to the                            patient.                           - Resume previous diet.                           - Continue present medications.                           - Await pathology results.                           - Repeat colonoscopy in 5 years for screening                            purposes (family history of colon cancer). Jasemine Nawaz L. Loletha Carrow, MD 05/09/2019 2:57:49 PM This report has been signed electronically.

## 2019-05-09 NOTE — Patient Instructions (Addendum)
Read all handouts given to you by your recovery room nurse.   You need to make an appointment to see Dr. Jenny Reichmann in 3-4 weeks.  Be sure to take your stomach medicine every day on an empty stomach. Stop using your aspirin. Thank-you for choosing [place] for your healthcare needs today.  YOU HAD AN ENDOSCOPIC PROCEDURE TODAY AT [facility]:   Refer to the procedure report that was given to you for any specific questions about what was found during the examination.  If the procedure report does not answer your questions, please call your gastroenterologist to clarify.  If you requested that your care partner not be given the details of your procedure findings, then the procedure report has been included in a sealed envelope for you to review at your convenience later.  YOU SHOULD EXPECT: Some feelings of bloating in the abdomen. Passage of more gas than usual.  Walking can help get rid of the air that was put into your GI tract during the procedure and reduce the bloating. If you had a lower endoscopy (such as a colonoscopy or flexible sigmoidoscopy) you may notice spotting of blood in your stool or on the toilet paper. If you underwent a bowel prep for your procedure, you may not have a normal bowel movement for a few days.  Please Note:  You might notice some irritation and congestion in your nose or some drainage.  This is from the oxygen used during your procedure.  There is no need for concern and it should clear up in a day or so.  SYMPTOMS TO REPORT IMMEDIATELY:   Following lower endoscopy (colonoscopy or flexible sigmoidoscopy):  Excessive amounts of blood in the stool  Significant tenderness or worsening of abdominal pains  Swelling of the abdomen that is new, acute  Fever of 100F or higher   Following upper endoscopy (EGD)  Vomiting of blood or coffee ground material  New chest pain or pain under the shoulder blades  Painful or persistently difficult swallowing  New shortness  of breath  Fever of 100F or higher  Black, tarry-looking stools  For urgent or emergent issues, a gastroenterologist can be reached at any hour by calling [phone].   DIET:  We do recommend a small meal at first, but then you may proceed to your regular diet.  Drink plenty of fluids but you should avoid alcoholic beverages for 24 hours.  Try to eat more fiber and drink more water in your diet.  ACTIVITY:  You should plan to take it easy for the rest of today and you should NOT DRIVE or use heavy machinery until tomorrow (because of the sedation medicines used during the test).    FOLLOW UP: Our staff will call the number listed on your records 48-72 hours following your procedure to check on you and address any questions or concerns that you may have regarding the information given to you following your procedure. If we do not reach you, we will leave a message.  We will attempt to reach you two times.  During this call, we will ask if you have developed any symptoms of COVID 19. If you develop any symptoms (ie: fever, flu-like symptoms, shortness of breath, cough etc.) before then, please call [phone].  If you test positive for Covid 19 in the 2 weeks post procedure, please call and report this information to [place].    If any biopsies were taken you will be contacted by phone or by  letter within the next 1-3 weeks.  Please call us at [phone] if you have not heard about the biopsies in 3 weeks.    SIGNATURES/CONFIDENTIALITY: You and/or your care partner have signed paperwork which will be entered into your electronic medical record.  These signatures attest to the fact that that the information above on your After Visit Summary has been reviewed and is understood.  Full responsibility of the confidentiality of this discharge information lies with you and/or your care-partner.

## 2019-05-09 NOTE — Progress Notes (Signed)
Report given to PACU, vss 

## 2019-05-13 ENCOUNTER — Telehealth: Payer: Self-pay | Admitting: *Deleted

## 2019-05-13 ENCOUNTER — Telehealth: Payer: Self-pay

## 2019-05-13 NOTE — Telephone Encounter (Signed)
2nd follow call made.  NALM

## 2019-05-13 NOTE — Telephone Encounter (Signed)
Follow call made.  No answer; Left message.

## 2019-05-16 ENCOUNTER — Encounter: Payer: Self-pay | Admitting: *Deleted

## 2019-05-16 ENCOUNTER — Other Ambulatory Visit: Payer: Self-pay | Admitting: *Deleted

## 2019-05-16 DIAGNOSIS — A048 Other specified bacterial intestinal infections: Secondary | ICD-10-CM

## 2019-05-16 MED ORDER — DOXYCYCLINE HYCLATE 100 MG PO CAPS: 100.0000 mg | ORAL_CAPSULE | Freq: Two times a day (BID) | ORAL | 0 refills | Status: AC

## 2019-05-16 MED ORDER — METRONIDAZOLE 250 MG PO TABS: 250.0000 mg | ORAL_TABLET | Freq: Four times a day (QID) | ORAL | 0 refills | Status: AC

## 2019-05-16 MED ORDER — BISMUTH SUBSALICYLATE 262 MG PO CHEW: 524.0000 mg | CHEWABLE_TABLET | Freq: Four times a day (QID) | ORAL | 0 refills | Status: AC

## 2019-05-20 ENCOUNTER — Telehealth: Payer: Self-pay | Admitting: Internal Medicine

## 2019-05-20 ENCOUNTER — Telehealth: Payer: Self-pay | Admitting: Gastroenterology

## 2019-05-20 NOTE — Telephone Encounter (Signed)
Pt has been notified and aware.  

## 2019-05-20 NOTE — Telephone Encounter (Signed)
    Patient wants to know if he needs to hold any of his medications prior to having Covid vaccine  On Friday

## 2019-05-20 NOTE — Telephone Encounter (Signed)
I do not know.  When he goes for COVID vaccine, he must tell them all the meds he is on, including these.

## 2019-05-20 NOTE — Telephone Encounter (Signed)
Pt has been informed to continue medications.

## 2019-05-28 ENCOUNTER — Encounter: Payer: Self-pay | Admitting: Internal Medicine

## 2019-05-28 ENCOUNTER — Ambulatory Visit (INDEPENDENT_AMBULATORY_CARE_PROVIDER_SITE_OTHER): Payer: Medicare HMO | Admitting: Internal Medicine

## 2019-05-28 ENCOUNTER — Other Ambulatory Visit: Payer: Self-pay

## 2019-05-28 VITALS — BP 118/68 | Temp 98.0°F | Ht 68.0 in | Wt 164.2 lb

## 2019-05-28 DIAGNOSIS — Z0001 Encounter for general adult medical examination with abnormal findings: Secondary | ICD-10-CM

## 2019-05-28 DIAGNOSIS — K922 Gastrointestinal hemorrhage, unspecified: Secondary | ICD-10-CM | POA: Diagnosis not present

## 2019-05-28 DIAGNOSIS — M12811 Other specific arthropathies, not elsewhere classified, right shoulder: Secondary | ICD-10-CM | POA: Diagnosis not present

## 2019-05-28 DIAGNOSIS — F419 Anxiety disorder, unspecified: Secondary | ICD-10-CM | POA: Diagnosis not present

## 2019-05-28 DIAGNOSIS — M75101 Unspecified rotator cuff tear or rupture of right shoulder, not specified as traumatic: Secondary | ICD-10-CM

## 2019-05-28 DIAGNOSIS — E559 Vitamin D deficiency, unspecified: Secondary | ICD-10-CM | POA: Diagnosis not present

## 2019-05-28 DIAGNOSIS — Z0000 Encounter for general adult medical examination without abnormal findings: Secondary | ICD-10-CM

## 2019-05-28 DIAGNOSIS — D519 Vitamin B12 deficiency anemia, unspecified: Secondary | ICD-10-CM

## 2019-05-28 DIAGNOSIS — R7302 Impaired glucose tolerance (oral): Secondary | ICD-10-CM

## 2019-05-28 DIAGNOSIS — I10 Essential (primary) hypertension: Secondary | ICD-10-CM

## 2019-05-28 DIAGNOSIS — D518 Other vitamin B12 deficiency anemias: Secondary | ICD-10-CM

## 2019-05-28 LAB — BASIC METABOLIC PANEL
BUN: 16 mg/dL (ref 6–23)
CO2: 29 mEq/L (ref 19–32)
Calcium: 9.5 mg/dL (ref 8.4–10.5)
Chloride: 106 mEq/L (ref 96–112)
Creatinine, Ser: 1.19 mg/dL (ref 0.40–1.50)
GFR: 73.24 mL/min (ref >60.00)
Glucose, Bld: 99 mg/dL (ref 70–99)
Potassium: 3.8 mEq/L (ref 3.5–5.1)
Sodium: 139 mEq/L (ref 135–145)

## 2019-05-28 LAB — CBC WITH DIFFERENTIAL/PLATELET
Basophils Absolute: 0 10*3/uL (ref 0.0–0.1)
Basophils Relative: 0.5 % (ref 0.0–3.0)
Eosinophils Absolute: 0.1 10*3/uL (ref 0.0–0.7)
Eosinophils Relative: 1.5 % (ref 0.0–5.0)
HCT: 41.2 % (ref 39.0–52.0)
Hemoglobin: 13.7 g/dL (ref 13.0–17.0)
Lymphocytes Relative: 30.8 % (ref 12.0–46.0)
Lymphs Abs: 1.7 10*3/uL (ref 0.7–4.0)
MCHC: 33.3 g/dL (ref 30.0–36.0)
MCV: 105.3 fl — ABNORMAL HIGH (ref 78.0–100.0)
Monocytes Absolute: 0.6 10*3/uL (ref 0.1–1.0)
Monocytes Relative: 10.7 % (ref 3.0–12.0)
Neutro Abs: 3.2 10*3/uL (ref 1.4–7.7)
Neutrophils Relative %: 56.5 % (ref 43.0–77.0)
Platelets: 303 10*3/uL (ref 150.0–400.0)
RBC: 3.91 Mil/uL — ABNORMAL LOW (ref 4.22–5.81)
RDW: 13.3 % (ref 11.5–15.5)
WBC: 5.6 10*3/uL (ref 4.0–10.5)

## 2019-05-28 LAB — LIPID PANEL
Cholesterol: 100 mg/dL (ref 0–200)
HDL: 48.5 mg/dL (ref >39.00)
LDL Cholesterol: 29 mg/dL (ref 0–99)
NonHDL: 51.99
Total CHOL/HDL Ratio: 2
Triglycerides: 117 mg/dL (ref 0.0–149.0)
VLDL: 23.4 mg/dL (ref 0.0–40.0)

## 2019-05-28 LAB — VITAMIN D 25 HYDROXY (VIT D DEFICIENCY, FRACTURES): VITD: 32.33 ng/mL (ref 30.00–100.00)

## 2019-05-28 LAB — TSH: TSH: 1.54 u[IU]/mL (ref 0.35–4.50)

## 2019-05-28 LAB — VITAMIN B12: Vitamin B-12: 339 pg/mL (ref 211–911)

## 2019-05-28 MED ORDER — TRAMADOL HCL 50 MG PO TABS: 50.0000 mg | ORAL_TABLET | Freq: Four times a day (QID) | ORAL | 0 refills | Status: DC | PRN

## 2019-05-28 MED ORDER — CITALOPRAM HYDROBROMIDE 10 MG PO TABS: 10.0000 mg | ORAL_TABLET | Freq: Every day | ORAL | 3 refills | Status: DC

## 2019-05-28 NOTE — Assessment & Plan Note (Signed)
Recent GI bleed now improved with iron, ok for f/u cbc

## 2019-05-28 NOTE — Progress Notes (Signed)
Subjective:    Patient ID: Nathan Wallace, male    DOB: 1951/01/23, 69 y.o.   MRN: [id number]  HPI  Here for wellness and f/u;  Overall doing ok;  Pt denies Chest pain, worsening SOB, DOE, wheezing, orthopnea, PND, worsening LE edema, palpitations, dizziness or syncope.  Pt denies neurological change such as new headache, facial or extremity weakness.  Pt denies polydipsia, polyuria, or low sugar symptoms. Pt states overall good compliance with treatment and medications, good tolerability, and has been trying to follow appropriate diet.  Pt denies worsening depressive symptoms, suicidal ideation or panic. No fever, night sweats, wt loss, loss of appetite, or other constitutional symptoms.  Pt states good ability with ADL's, has low fall risk, home safety reviewed and adequate, no other significant changes in hearing or vision, and only occasionally active with exercise. Saw urology last month doing well. Denies worsening reflux, abd pain, dysphagia, n/v, bowel change or blood, no black tarry stools. Has ongoing arthritis right shoulder, needs pain control. ALso, Denies worsening depressive symptoms, suicidal ideation, or panic; has ongoing anxiety, increased recently with multilple stressors. Past Medical History:  Diagnosis Date  . AKI (acute kidney injury) ([place])   . Arthritis    RT KNEE AND RT SHOULDER  . B12 deficiency   . Benign head tremor   . Chest tightness    comes and goes   . CVA (cerebrovascular accident due to intracerebral hemorrhage) ([place]) 08/03/2011  . Degenerative joint disease of knee, right   . Depression 07/26/2012   no problems now  . Diverticulosis   . GERD (gastroesophageal reflux disease)    NO RECENT PROBLEMS AND NO MEDS  . History of blood transfusion   . History of kidney stones   . History of urinary tract infection   . Hyperlipidemia    border line  . Hypertension   . Impaired glucose tolerance 06/22/2011  . Left inguinal hernia   . Macrocytic anemia   . MGUS  (monoclonal gammopathy of unknown significance)   . Prostate cancer ([place]) 2011  . Rotator cuff tear arthropathy of right shoulder, with instability 10/25/2016  . Sepsis ([place])   . Stress incontinence   . Stroke [facility])    2013  . Syncope 2012   after prostate surgery; neg neurology work up.    Past Surgical History:  Procedure Laterality Date  . COLONOSCOPY    . INGUINAL HERNIA REPAIR  06/01/2011   Procedure: HERNIA REPAIR INGUINAL ADULT;  Surgeon: Adin Hector, MD;  Location: [facility];  Service: General;  Laterality: Left;  Left Inguinal Hernia Repair with Mesh  . ROBOT ASSISTED LAPAROSCOPIC RADICAL PROSTATECTOMY  jan 2012   da vinci  . SPINE SURGERY  2003 OR 2004   CERVICAL FUSION-STATES LIMITED ROM NECK  . TOTAL SHOULDER ARTHROPLASTY Right 10/25/2016  . TOTAL SHOULDER ARTHROPLASTY Right 10/25/2016   Procedure: RIGHT TOTAL SHOULDER ARTHROPLASTY;  Surgeon: Marchia Bond, MD;  Location: [facility];  Service: Orthopedics;  Laterality: Right;  . URETHRAL SLING N/A 03/08/2016   Procedure: MALE SLING CYSTOSCOPY;  Surgeon: Bjorn Loser, MD;  Location: [facility];  Service: Urology;  Laterality: N/A;  . WRIST SURGERY     right     reports that he quit smoking about 52 years ago. His smoking use included cigarettes. He has a 0.50 pack-year smoking history. He has never used smokeless tobacco. He reports current alcohol use. He reports previous drug use. Drug: Marijuana. family history includes Aneurysm in his mother; Colon  cancer (age of onset: 37) in his father; Heart attack in his brother; Hypertension in his mother. Allergies  Allergen Reactions  . No Known Allergies   . Nsaids Other (See Comments)    Gastric ulcer   Current Outpatient Medications on File Prior to Visit  Medication Sig Dispense Refill  . allopurinol (ZYLOPRIM) 300 MG tablet TAKE 1 TABLET (300 MG TOTAL) BY MOUTH DAILY. 90 tablet 3  . amLODipine (NORVASC) 5 MG tablet Take 1 tablet (5 mg total) by mouth daily. 90 tablet 1  .  Ascorbic Acid (VITAMIN C WITH ROSE HIPS) 1000 MG tablet Take 1 tablet (1,000 mg total) by mouth daily. 90 tablet 0  . atorvastatin (LIPITOR) 40 MG tablet TAKE 1 TABLET (40 MG TOTAL) BY MOUTH DAILY. 90 tablet 3  . Cholecalciferol (VITAMIN D-3) 1000 units CAPS Take 2 capsules (2,000 Units total) by mouth daily. 180 capsule 0  . colchicine 0.6 MG tablet Take 1 tablet (0.6 mg total) by mouth 2 (two) times daily. For 5 days when having flare. 180 tablet 1  . Cyanocobalamin (B-12 COMPLIANCE INJECTION IJ) Inject 1 Dose as directed every 30 (thirty) days.    Marland Kitchen gabapentin (NEURONTIN) 300 MG capsule TAKE 2 CAPSULES BY MOUTH 3  TIMES DAILY 180 capsule 3  . iron polysaccharides (NU-IRON) 150 MG capsule Take 1 capsule (150 mg total) by mouth daily. 90 capsule 1  . pantoprazole (PROTONIX) 40 MG tablet Take 1 tablet (40 mg total) by mouth daily. 90 tablet 3  . PRESCRIPTION MEDICATION 1 Syringe by Intracavernosal route every other day as needed (10-30 minutes before you have sex for ED). Matrix (Papaverine 30mg  Phentolamine 1mg  Alprostadil 57mcg per ml injection)  erectile dysfunction injection every other day as needed for sex    . tiZANidine (ZANAFLEX) 4 MG tablet TAKE 1 TABLET BY MOUTH 3  TIMES DAILY AS NEEDED FOR  MUSCLE SPASM(S) 270 tablet 1  . aspirin 81 MG chewable tablet Chew 1 tablet (81 mg total) by mouth daily. (Patient not taking: Reported on 05/28/2019)     Current Facility-Administered Medications on File Prior to Visit  Medication Dose Route Frequency Provider Last Rate Last Admin  . cyanocobalamin ((VITAMIN B-12)) injection 1,000 mcg  1,000 mcg Intramuscular Once Biagio Borg, MD       Review of Systems All otherwise neg per pt     Objective:   Physical Exam BP 118/68 (BP Location: Left Arm, Patient Position: Sitting, Cuff Size: Normal)   Temp 98 F (36.7 C) (Oral)   Ht 5\' 8"  (1.727 m)   Wt 164 lb 3.2 oz (74.5 kg)   BMI 24.97 kg/m  VS noted,  Constitutional: Pt appears in NAD HENT:  Head: NCAT.  Right Ear: External ear normal.  Left Ear: External ear normal.  Eyes: . Pupils are equal, round, and reactive to light. Conjunctivae and EOM are normal Nose: without d/c or deformity Neck: Neck supple. Gross normal ROM Cardiovascular: Normal rate and regular rhythm.   Pulmonary/Chest: Effort normal and breath sounds without rales or wheezing.  Abd:  Soft, NT, ND, + BS, no organomegaly Neurological: Pt is alert. At baseline orientation, motor grossly intact Skin: Skin is warm. No rashes, other new lesions, no LE edema Psychiatric: Pt behavior is normal without agitation  All otherwise neg per pt  Lab Results  Component Value Date   WBC 6.5 04/23/2019   HGB 10.2 (L) 04/23/2019   HCT 30.6 (L) 04/23/2019   PLT 444.0 (H) 04/23/2019  GLUCOSE 103 (H) 04/17/2019   CHOL 122 05/18/2017   TRIG 133.0 05/18/2017   HDL 40.40 05/18/2017   LDLDIRECT 87.9 12/19/2011   LDLCALC 55 05/18/2017   ALT 28 04/17/2019   AST 26 04/17/2019   NA 141 04/17/2019   K 3.7 04/17/2019   CL 107 04/17/2019   CREATININE 1.04 04/17/2019   BUN 12 04/17/2019   CO2 29 04/17/2019   TSH 1.85 05/18/2017   PSA 0.07 (L) 09/23/2015   INR 1.12 03/02/2016   HGBA1C 5.2 05/22/2018       Assessment & Plan:

## 2019-05-28 NOTE — Assessment & Plan Note (Addendum)
UGI due to gastric ulcer most likely, and most likely releated to H pylori and alleve use - for cont PPI, cont H pylori tx, f/u GI urea breath test, and f/u cbc  I spent30 minutes in addition to time for wellness examination in preparing to see the patient by review of recent labs, imaging and procedures, obtaining and reviewing separately obtained history, communicating with the patient and family or caregiver, ordering medications, tests or procedures, and documenting clinical information in the EHR including the differential Dx, treatment, and any further evaluation and other management of GI bleeding, anxiety, b12 deficiency/iron deficiency, hyperglycemia, HTN,  Chronic pain

## 2019-05-28 NOTE — Assessment & Plan Note (Signed)

## 2019-05-28 NOTE — Assessment & Plan Note (Signed)
Mild to mod, for celexa 10 qd,  to f/u any worsening symptoms or concerns 

## 2019-05-28 NOTE — Assessment & Plan Note (Signed)
stable overall by history and exam, recent data reviewed with pt, and pt to continue medical treatment as before,  to f/u any worsening symptoms or concerns  

## 2019-05-28 NOTE — Assessment & Plan Note (Signed)
With chronic pain - for pain management if needed

## 2019-05-28 NOTE — Patient Instructions (Signed)
Please take all new medication as prescribed - the tramadol for pain, and the celexa 10 mg for nerves  Please continue all other medications as before, and refills have been done if requested.  Please have the pharmacy call with any other refills you may need.  Please continue your efforts at being more active, low cholesterol diet, and weight control.  You are otherwise up to date with prevention measures today.  Please keep your appointments with your specialists as you may have planned  Please go to the LAB at the blood drawing area for the tests to be done  You will be contacted by phone if any changes need to be made immediately.  Otherwise, you will receive a letter about your results with an explanation, but please check with MyChart first.  Please remember to sign up for MyChart if you have not done so, as this will be important to you in the future with finding out test results, communicating by private email, and scheduling acute appointments online when needed.  Please make an Appointment to return in 6 months, or sooner if needed

## 2019-06-05 ENCOUNTER — Ambulatory Visit (INDEPENDENT_AMBULATORY_CARE_PROVIDER_SITE_OTHER): Payer: Medicare HMO | Admitting: *Deleted

## 2019-06-05 ENCOUNTER — Telehealth: Payer: Self-pay | Admitting: Internal Medicine

## 2019-06-05 ENCOUNTER — Telehealth: Payer: Self-pay

## 2019-06-05 ENCOUNTER — Other Ambulatory Visit: Payer: Self-pay

## 2019-06-05 DIAGNOSIS — D519 Vitamin B12 deficiency anemia, unspecified: Secondary | ICD-10-CM | POA: Diagnosis not present

## 2019-06-05 DIAGNOSIS — D518 Other vitamin B12 deficiency anemias: Secondary | ICD-10-CM

## 2019-06-05 MED ORDER — CITALOPRAM HYDROBROMIDE 10 MG PO TABS: 10.0000 mg | ORAL_TABLET | Freq: Every day | ORAL | 3 refills | Status: DC

## 2019-06-05 MED ORDER — CYANOCOBALAMIN 1000 MCG/ML IJ SOLN
1000.0000 ug | Freq: Once | INTRAMUSCULAR | Status: AC
  Administered 2019-06-05: 1000 ug via INTRAMUSCULAR

## 2019-06-05 MED ORDER — GABAPENTIN 300 MG PO CAPS: ORAL_CAPSULE | ORAL | 1 refills | Status: DC

## 2019-06-05 MED ORDER — GABAPENTIN 300 MG PO CAPS: ORAL_CAPSULE | ORAL | 0 refills | Status: DC

## 2019-06-05 MED ORDER — CITALOPRAM HYDROBROMIDE 10 MG PO TABS: 10.0000 mg | ORAL_TABLET | Freq: Every day | ORAL | 0 refills | Status: DC

## 2019-06-05 NOTE — Progress Notes (Signed)
Medical screening examination/treatment/procedure(s) were performed by non-physician practitioner and as supervising physician I was immediately available for consultation/collaboration. I agree with above. Nicolis Boody, MD   

## 2019-06-05 NOTE — Telephone Encounter (Signed)
       1. Which medications need to be refilled? (please list name of each medication and dose if known) amLODipine (NORVASC) 5 MG tablet  2. Which pharmacy/location (including street and city if local pharmacy) is medication to be sent to? [id number] - [place], [place] - [address]  3. Do they need a 30 day or 90 day supply? [place]

## 2019-06-05 NOTE — Telephone Encounter (Signed)
All done as per pt request

## 2019-06-05 NOTE — Telephone Encounter (Signed)
Please see below message.

## 2019-06-05 NOTE — Telephone Encounter (Signed)
MEDICATION: gabapentin (NEURONTIN) 300 MG capsule  citalopram (CELEXA) 10 MG tablet  Pharmacy :  [facility], [place] A 90 DAY SUPPLY : yes   IS PATIENT OUT OF MEDICATION: yes  IF NOT; HOW MUCH IS LEFT:   LAST APPOINTMENT DATE: @2 /16/2021  NEXT APPOINTMENT DATE:@2 /24/2021  DO WE HAVE YOUR PERMISSION TO LEAVE A DETAILED MESSAGE:  OTHER COMMENTS: patient says he is currently out of medication and would like to have the medication sent to [facility] for his 90 day supply's but wants to see if he can get a bridge amount [id number] - [place], [address] until other pharmacy sends his 90 day supply    **Let patient know to contact pharmacy at the end of the day to make sure medication is ready. **  ** Please notify patient to allow 48-72 hours to process**  **Encourage patient to contact the pharmacy for refills or they can request refills through [facility]**

## 2019-06-06 ENCOUNTER — Other Ambulatory Visit: Payer: Self-pay

## 2019-06-06 MED ORDER — AMLODIPINE BESYLATE 5 MG PO TABS: 5.0000 mg | ORAL_TABLET | Freq: Every day | ORAL | 1 refills | Status: DC

## 2019-06-06 NOTE — Telephone Encounter (Signed)
Refill sent in today

## 2019-06-18 ENCOUNTER — Other Ambulatory Visit: Payer: Self-pay | Admitting: Internal Medicine

## 2019-06-24 ENCOUNTER — Other Ambulatory Visit: Payer: Self-pay

## 2019-06-24 MED ORDER — PANTOPRAZOLE SODIUM 40 MG PO TBEC: 40.0000 mg | DELAYED_RELEASE_TABLET | Freq: Every day | ORAL | 2 refills | Status: DC

## 2019-06-28 ENCOUNTER — Encounter: Payer: Self-pay | Admitting: Gastroenterology

## 2019-06-28 DIAGNOSIS — A048 Other specified bacterial intestinal infections: Secondary | ICD-10-CM | POA: Diagnosis not present

## 2019-07-01 ENCOUNTER — Telehealth: Payer: Self-pay | Admitting: Gastroenterology

## 2019-07-01 NOTE — Telephone Encounter (Signed)
Please inform Mr. Hirschi that the urea breath test done last week is negative.  That means the H. pylori bacteria cleared with the antibiotics.

## 2019-07-02 NOTE — Telephone Encounter (Signed)
Patient has been notified and aware. No questions at this time.

## 2019-07-03 ENCOUNTER — Other Ambulatory Visit: Payer: Self-pay

## 2019-07-03 ENCOUNTER — Other Ambulatory Visit: Payer: Self-pay | Admitting: Internal Medicine

## 2019-07-03 ENCOUNTER — Ambulatory Visit (INDEPENDENT_AMBULATORY_CARE_PROVIDER_SITE_OTHER): Payer: Medicare HMO | Admitting: *Deleted

## 2019-07-03 DIAGNOSIS — E538 Deficiency of other specified B group vitamins: Secondary | ICD-10-CM | POA: Diagnosis not present

## 2019-07-03 MED ORDER — CYANOCOBALAMIN 1000 MCG/ML IJ SOLN
1000.0000 ug | Freq: Once | INTRAMUSCULAR | Status: AC
  Administered 2019-07-03: 1000 ug via INTRAMUSCULAR

## 2019-07-03 NOTE — Telephone Encounter (Signed)
Please refill as per office routine med refill policy (all routine meds refilled for 3 mo or monthly per pt preference up to one year from last visit, then month to month grace period for 3 mo, then further med refills will have to be denied)  

## 2019-07-03 NOTE — Progress Notes (Signed)
Pls cosign for B12 inj../lmb  

## 2019-08-05 ENCOUNTER — Other Ambulatory Visit: Payer: Self-pay

## 2019-08-05 ENCOUNTER — Ambulatory Visit (INDEPENDENT_AMBULATORY_CARE_PROVIDER_SITE_OTHER): Payer: Medicare HMO | Admitting: *Deleted

## 2019-08-05 DIAGNOSIS — E538 Deficiency of other specified B group vitamins: Secondary | ICD-10-CM

## 2019-08-05 MED ORDER — CYANOCOBALAMIN 1000 MCG/ML IJ SOLN
1000.0000 ug | Freq: Once | INTRAMUSCULAR | Status: AC
  Administered 2019-08-05: 1000 ug via INTRAMUSCULAR

## 2019-08-05 NOTE — Progress Notes (Signed)
Pls cosign for B12 inj../lmb  

## 2019-08-28 ENCOUNTER — Other Ambulatory Visit: Payer: Self-pay | Admitting: Internal Medicine

## 2019-08-28 NOTE — Telephone Encounter (Signed)
Please refill as per office routine med refill policy (all routine meds refilled for 3 mo or monthly per pt preference up to one year from last visit, then month to month grace period for 3 mo, then further med refills will have to be denied)  

## 2019-09-02 ENCOUNTER — Ambulatory Visit (INDEPENDENT_AMBULATORY_CARE_PROVIDER_SITE_OTHER): Payer: Medicare HMO

## 2019-09-02 ENCOUNTER — Other Ambulatory Visit: Payer: Self-pay

## 2019-09-02 DIAGNOSIS — D518 Other vitamin B12 deficiency anemias: Secondary | ICD-10-CM

## 2019-09-02 DIAGNOSIS — E538 Deficiency of other specified B group vitamins: Secondary | ICD-10-CM | POA: Diagnosis not present

## 2019-09-02 DIAGNOSIS — D519 Vitamin B12 deficiency anemia, unspecified: Secondary | ICD-10-CM | POA: Diagnosis not present

## 2019-09-02 MED ORDER — CYANOCOBALAMIN 1000 MCG/ML IJ SOLN
1000.0000 ug | Freq: Once | INTRAMUSCULAR | Status: AC
  Administered 2019-09-02: 1000 ug via INTRAMUSCULAR

## 2019-09-02 NOTE — Progress Notes (Signed)
Pt here for monthly B12 injection per Dr Jenny Reichmann.  B12 1031mcg given IM, pt tolerated well.  Pt to scheduled next B12 before leaving.

## 2019-09-02 NOTE — Progress Notes (Signed)
Patient ID: Nathan Wallace, male   DOB: 1951/03/10, 69 y.o.   MRN: [id number] Medical treatment/procedure(s) were performed by non-physician practitioner and as supervising physician I was immediately available for consultation/collaboration. I agree with above. Hoyt Koch, MD

## 2019-10-03 ENCOUNTER — Ambulatory Visit: Payer: Medicare HMO

## 2019-10-04 ENCOUNTER — Ambulatory Visit (INDEPENDENT_AMBULATORY_CARE_PROVIDER_SITE_OTHER): Payer: Medicare HMO | Admitting: *Deleted

## 2019-10-04 ENCOUNTER — Ambulatory Visit: Payer: Medicare HMO

## 2019-10-04 ENCOUNTER — Other Ambulatory Visit: Payer: Self-pay

## 2019-10-04 DIAGNOSIS — E538 Deficiency of other specified B group vitamins: Secondary | ICD-10-CM | POA: Diagnosis not present

## 2019-10-04 MED ORDER — CYANOCOBALAMIN 1000 MCG/ML IJ SOLN
1000.0000 ug | Freq: Once | INTRAMUSCULAR | Status: AC
  Administered 2019-10-04: 1000 ug via INTRAMUSCULAR

## 2019-10-04 NOTE — Progress Notes (Signed)
Pls cosign for B12 inj../lmb  

## 2019-11-04 ENCOUNTER — Other Ambulatory Visit: Payer: Self-pay

## 2019-11-04 ENCOUNTER — Ambulatory Visit (INDEPENDENT_AMBULATORY_CARE_PROVIDER_SITE_OTHER): Payer: Medicare HMO | Admitting: *Deleted

## 2019-11-04 DIAGNOSIS — E538 Deficiency of other specified B group vitamins: Secondary | ICD-10-CM | POA: Diagnosis not present

## 2019-11-04 MED ORDER — CYANOCOBALAMIN 1000 MCG/ML IJ SOLN
1000.0000 ug | Freq: Once | INTRAMUSCULAR | Status: AC
  Administered 2019-11-04: 1000 ug via INTRAMUSCULAR

## 2019-11-04 NOTE — Progress Notes (Signed)
Pls cosign for B12 inj../lmb  

## 2019-12-05 ENCOUNTER — Ambulatory Visit (INDEPENDENT_AMBULATORY_CARE_PROVIDER_SITE_OTHER): Payer: Medicare HMO | Admitting: *Deleted

## 2019-12-05 ENCOUNTER — Other Ambulatory Visit: Payer: Self-pay

## 2019-12-05 DIAGNOSIS — E538 Deficiency of other specified B group vitamins: Secondary | ICD-10-CM | POA: Diagnosis not present

## 2019-12-05 MED ORDER — CYANOCOBALAMIN 1000 MCG/ML IJ SOLN
1000.0000 ug | Freq: Once | INTRAMUSCULAR | Status: AC
  Administered 2019-12-05: 1000 ug via INTRAMUSCULAR

## 2019-12-05 NOTE — Progress Notes (Signed)
Pls cosign for B12 inj in absence of PCP../lmb   

## 2020-01-06 ENCOUNTER — Ambulatory Visit (INDEPENDENT_AMBULATORY_CARE_PROVIDER_SITE_OTHER): Payer: Medicare HMO

## 2020-01-06 ENCOUNTER — Other Ambulatory Visit: Payer: Self-pay

## 2020-01-06 DIAGNOSIS — Z23 Encounter for immunization: Secondary | ICD-10-CM

## 2020-01-06 DIAGNOSIS — E538 Deficiency of other specified B group vitamins: Secondary | ICD-10-CM

## 2020-01-06 MED ORDER — CYANOCOBALAMIN 1000 MCG/ML IJ SOLN
1000.0000 ug | Freq: Once | INTRAMUSCULAR | Status: AC
  Administered 2020-01-06: 1000 ug via INTRAMUSCULAR

## 2020-01-06 NOTE — Progress Notes (Signed)
B12 given

## 2020-01-14 ENCOUNTER — Other Ambulatory Visit: Payer: Self-pay

## 2020-01-14 ENCOUNTER — Inpatient Hospital Stay: Payer: Medicare HMO | Attending: Hematology and Oncology

## 2020-01-14 ENCOUNTER — Inpatient Hospital Stay (HOSPITAL_BASED_OUTPATIENT_CLINIC_OR_DEPARTMENT_OTHER): Payer: Medicare HMO | Admitting: Medical

## 2020-01-14 DIAGNOSIS — R7989 Other specified abnormal findings of blood chemistry: Secondary | ICD-10-CM | POA: Insufficient documentation

## 2020-01-14 DIAGNOSIS — D472 Monoclonal gammopathy: Secondary | ICD-10-CM

## 2020-01-14 DIAGNOSIS — R748 Abnormal levels of other serum enzymes: Secondary | ICD-10-CM | POA: Diagnosis not present

## 2020-01-14 DIAGNOSIS — E538 Deficiency of other specified B group vitamins: Secondary | ICD-10-CM | POA: Diagnosis not present

## 2020-01-14 DIAGNOSIS — Z23 Encounter for immunization: Secondary | ICD-10-CM | POA: Insufficient documentation

## 2020-01-14 DIAGNOSIS — Z7982 Long term (current) use of aspirin: Secondary | ICD-10-CM | POA: Diagnosis not present

## 2020-01-14 DIAGNOSIS — G629 Polyneuropathy, unspecified: Secondary | ICD-10-CM | POA: Diagnosis not present

## 2020-01-14 DIAGNOSIS — Z79899 Other long term (current) drug therapy: Secondary | ICD-10-CM | POA: Insufficient documentation

## 2020-01-14 LAB — CBC WITH DIFFERENTIAL/PLATELET
Abs Immature Granulocytes: 0.03 10*3/uL (ref 0.00–0.07)
Basophils Absolute: 0 10*3/uL (ref 0.0–0.1)
Basophils Relative: 0 %
Eosinophils Absolute: 0.1 10*3/uL (ref 0.0–0.5)
Eosinophils Relative: 2 %
HCT: 42.3 % (ref 39.0–52.0)
Hemoglobin: 14.4 g/dL (ref 13.0–17.0)
Immature Granulocytes: 0 %
Lymphocytes Relative: 23 %
Lymphs Abs: 1.7 10*3/uL (ref 0.7–4.0)
MCH: 34 pg (ref 26.0–34.0)
MCHC: 34 g/dL (ref 30.0–36.0)
MCV: 100 fL (ref 80.0–100.0)
Monocytes Absolute: 0.6 10*3/uL (ref 0.1–1.0)
Monocytes Relative: 8 %
Neutro Abs: 4.8 10*3/uL (ref 1.7–7.7)
Neutrophils Relative %: 67 %
Platelets: 279 10*3/uL (ref 150–400)
RBC: 4.23 MIL/uL (ref 4.22–5.81)
RDW: 12.6 % (ref 11.5–15.5)
WBC: 7.2 10*3/uL (ref 4.0–10.5)
nRBC: 0 % (ref 0.0–0.2)

## 2020-01-14 LAB — COMPREHENSIVE METABOLIC PANEL
ALT: 83 U/L — ABNORMAL HIGH (ref 0–44)
AST: 67 U/L — ABNORMAL HIGH (ref 15–41)
Albumin: 4.2 g/dL (ref 3.5–5.0)
Alkaline Phosphatase: 76 U/L (ref 38–126)
Anion gap: 6 (ref 5–15)
BUN: 14 mg/dL (ref 8–23)
CO2: 33 mmol/L — ABNORMAL HIGH (ref 22–32)
Calcium: 10.1 mg/dL (ref 8.9–10.3)
Chloride: 104 mmol/L (ref 98–111)
Creatinine, Ser: 1.38 mg/dL — ABNORMAL HIGH (ref 0.61–1.24)
GFR calc non Af Amer: 51 mL/min — ABNORMAL LOW (ref >60)
Glucose, Bld: 102 mg/dL — ABNORMAL HIGH (ref 70–99)
Potassium: 4.2 mmol/L (ref 3.5–5.1)
Sodium: 143 mmol/L (ref 135–145)
Total Bilirubin: 1.2 mg/dL (ref 0.3–1.2)
Total Protein: 8.1 g/dL (ref 6.5–8.1)

## 2020-01-15 LAB — KAPPA/LAMBDA LIGHT CHAINS
Kappa free light chain: 49.7 mg/L — ABNORMAL HIGH (ref 3.3–19.4)
Kappa, lambda light chain ratio: 4.18 — ABNORMAL HIGH (ref 0.26–1.65)
Lambda free light chains: 11.9 mg/L (ref 5.7–26.3)

## 2020-01-17 LAB — MULTIPLE MYELOMA PANEL, SERUM
Albumin SerPl Elph-Mcnc: 4.1 g/dL (ref 2.9–4.4)
Albumin/Glob SerPl: 1.3 (ref 0.7–1.7)
Alpha 1: 0.2 g/dL (ref 0.0–0.4)
Alpha2 Glob SerPl Elph-Mcnc: 0.5 g/dL (ref 0.4–1.0)
B-Globulin SerPl Elph-Mcnc: 1.2 g/dL (ref 0.7–1.3)
Gamma Glob SerPl Elph-Mcnc: 1.3 g/dL (ref 0.4–1.8)
Globulin, Total: 3.2 g/dL (ref 2.2–3.9)
IgA: 373 mg/dL (ref 61–437)
IgG (Immunoglobin G), Serum: 1526 mg/dL (ref 603–1613)
IgM (Immunoglobulin M), Srm: 32 mg/dL (ref 20–172)
M Protein SerPl Elph-Mcnc: 0.7 g/dL — ABNORMAL HIGH
Total Protein ELP: 7.3 g/dL (ref 6.0–8.5)

## 2020-01-20 NOTE — Progress Notes (Signed)
Was asked by laboratory phlebotomist top see patient. Saw patient in the waiting area. He had no issues of concern.  Sandi Mealy, MHS, PA-C Physician Assistant

## 2020-01-21 ENCOUNTER — Encounter: Payer: Self-pay | Admitting: Hematology and Oncology

## 2020-01-21 ENCOUNTER — Inpatient Hospital Stay: Payer: Medicare HMO

## 2020-01-21 ENCOUNTER — Inpatient Hospital Stay: Payer: Medicare HMO | Admitting: Hematology and Oncology

## 2020-01-21 ENCOUNTER — Other Ambulatory Visit: Payer: Self-pay

## 2020-01-21 DIAGNOSIS — D472 Monoclonal gammopathy: Secondary | ICD-10-CM | POA: Diagnosis not present

## 2020-01-21 DIAGNOSIS — R7989 Other specified abnormal findings of blood chemistry: Secondary | ICD-10-CM | POA: Diagnosis not present

## 2020-01-21 DIAGNOSIS — Z23 Encounter for immunization: Secondary | ICD-10-CM

## 2020-01-21 DIAGNOSIS — R748 Abnormal levels of other serum enzymes: Secondary | ICD-10-CM | POA: Diagnosis not present

## 2020-01-21 DIAGNOSIS — G629 Polyneuropathy, unspecified: Secondary | ICD-10-CM | POA: Diagnosis not present

## 2020-01-21 DIAGNOSIS — Z7982 Long term (current) use of aspirin: Secondary | ICD-10-CM | POA: Diagnosis not present

## 2020-01-21 DIAGNOSIS — Z299 Encounter for prophylactic measures, unspecified: Secondary | ICD-10-CM

## 2020-01-21 DIAGNOSIS — E538 Deficiency of other specified B group vitamins: Secondary | ICD-10-CM

## 2020-01-21 DIAGNOSIS — Z79899 Other long term (current) drug therapy: Secondary | ICD-10-CM | POA: Diagnosis not present

## 2020-01-21 NOTE — Assessment & Plan Note (Signed)
Clinically, he has no signs of disease progression. Plan to see him next year in with repeat blood work, examination and blood work. His elevated serum creatinine recently was not related to his MGUS His peripheral neuropathy is also not related to that

## 2020-01-21 NOTE — Assessment & Plan Note (Signed)
His recent elevated liver enzymes was related to alcohol intake No further work-up is needed in this regard

## 2020-01-21 NOTE — Progress Notes (Signed)
[place] OFFICE PROGRESS NOTE  Patient Care Team: Biagio Borg, MD as PCP - General Clearnce Sorrel, MD as Attending Physician (Neurology) Earnie Larsson, MD (Neurosurgery) Heath Lark, MD as Consulting Physician (Hematology and Oncology)  ASSESSMENT & PLAN:  MGUS (monoclonal gammopathy of unknown significance) Clinically, he has no signs of disease progression. Plan to see him next year in with repeat blood work, examination and blood work. His elevated serum creatinine recently was not related to his MGUS His peripheral neuropathy is also not related to that  B12 deficiency He will continue vitamin B12 injection through his primary care doctor  Elevated serum creatinine His serum creatinine was recently elevated I recommend increase oral fluid intake as tolerated This is not related to his MGUS  Elevated liver enzymes His recent elevated liver enzymes was related to alcohol intake No further work-up is needed in this regard  Preventive measure We discussed the importance of preventive care and reviewed the vaccination programs. He does not have any prior allergic reactions to Covid-19 vaccination. He agrees to proceed with booster dose of Covid-19 vaccination today and we will administer it today at the clinic.    No orders of the defined types were placed in this encounter.   All questions were answered. The patient knows to call the clinic with any problems, questions or concerns. The total time spent in the appointment was 20 minutes encounter with patients including review of chart and various tests results, discussions about plan of care and coordination of care plan   Heath Lark, MD 01/21/2020 9:50 AM  INTERVAL HISTORY: Please see below for problem oriented charting. He returns for further follow-up on his history of MGUS He feels well No recent infection, fever or chills He takes gabapentin for peripheral neuropathy He was noted to have elevated  liver enzymes but recalls drinking alcohol the weekend prior to that because of awaiting His appetite is fair No recent infection, fever or chills  SUMMARY OF ONCOLOGIC HISTORY:  This is a pleasant gentleman who was diagnosed with monoclonal gammopathy of unknown significance in 2013. The patient has history of anemia. Subsequent evaluation found that the patient had B12 deficiency. He was placed on B12 replacement therapy. 24-hour urine collection x-ray and blood work did not show evidence of end organ damage and he is being observed. He also has history of prostate cancer status post resection. His follow closely with his urologist.  REVIEW OF SYSTEMS:   Constitutional: Denies fevers, chills or abnormal weight loss Eyes: Denies blurriness of vision Ears, nose, mouth, throat, and face: Denies mucositis or sore throat Respiratory: Denies cough, dyspnea or wheezes Cardiovascular: Denies palpitation, chest discomfort or lower extremity swelling Gastrointestinal:  Denies nausea, heartburn or change in bowel habits Skin: Denies abnormal skin rashes Lymphatics: Denies new lymphadenopathy or easy bruising Neurological:Denies numbness, tingling or new weaknesses Behavioral/Psych: Mood is stable, no new changes  All other systems were reviewed with the patient and are negative.  I have reviewed the past medical history, past surgical history, social history and family history with the patient and they are unchanged from previous note.  ALLERGIES:  is allergic to no known allergies and nsaids.  MEDICATIONS:  Current Outpatient Medications  Medication Sig Dispense Refill  . allopurinol (ZYLOPRIM) 300 MG tablet TAKE 1 TABLET (300 MG TOTAL) BY MOUTH DAILY. 90 tablet 3  . amLODipine (NORVASC) 5 MG tablet TAKE 1 TABLET (5 MG TOTAL) BY MOUTH DAILY. 90 tablet 3  . Ascorbic  Acid (VITAMIN C WITH ROSE HIPS) 1000 MG tablet Take 1 tablet (1,000 mg total) by mouth daily. 90 tablet 0  . aspirin 81 MG  chewable tablet Chew 1 tablet (81 mg total) by mouth daily. (Patient not taking: Reported on 05/28/2019)    . atorvastatin (LIPITOR) 40 MG tablet TAKE 1 TABLET (40 MG TOTAL) BY MOUTH DAILY. 90 tablet 3  . Cholecalciferol (VITAMIN D-3) 1000 units CAPS Take 2 capsules (2,000 Units total) by mouth daily. 180 capsule 0  . citalopram (CELEXA) 10 MG tablet Take 1 tablet (10 mg total) by mouth daily. 90 tablet 3  . colchicine 0.6 MG tablet Take 1 tablet (0.6 mg total) by mouth 2 (two) times daily. For 5 days when having flare. 180 tablet 1  . Cyanocobalamin (B-12 COMPLIANCE INJECTION IJ) Inject 1 Dose as directed every 30 (thirty) days.    Marland Kitchen gabapentin (NEURONTIN) 300 MG capsule TAKE 2 CAPSULES BY MOUTH 3  TIMES DAILY 540 capsule 1  . iron polysaccharides (NU-IRON) 150 MG capsule Take 1 capsule (150 mg total) by mouth daily. 90 capsule 1  . pantoprazole (PROTONIX) 40 MG tablet TAKE 1 TABLET BY MOUTH EVERY DAY 90 tablet 2  . PRESCRIPTION MEDICATION 1 Syringe by Intracavernosal route every other day as needed (10-30 minutes before you have sex for ED). Matrix (Papaverine 30mg  Phentolamine 1mg  Alprostadil 12mcg per ml injection)  erectile dysfunction injection every other day as needed for sex    . tiZANidine (ZANAFLEX) 4 MG tablet TAKE 1 TABLET BY MOUTH 3  TIMES DAILY AS NEEDED FOR  MUSCLE SPASM(S) 270 tablet 1  . traMADol (ULTRAM) 50 MG tablet Take 1 tablet (50 mg total) by mouth every 6 (six) hours as needed. 30 tablet 0   No current facility-administered medications for this visit.    PHYSICAL EXAMINATION: ECOG PERFORMANCE STATUS: 0 - Asymptomatic  Vitals:   01/21/20 0917  BP: 138/80  Pulse: (!) 57  Resp: 17  Temp: 97.6 F (36.4 C)  SpO2: 99%   Filed Weights   01/21/20 0917  Weight: 156 lb (70.8 kg)    GENERAL:alert, no distress and comfortable NEURO: alert & oriented x 3 with fluent speech, no focal motor/sensory deficits  LABORATORY DATA:  I have reviewed the data as listed     Component Value Date/Time   NA 143 01/14/2020 1027   NA 141 12/29/2016 1152   K 4.2 01/14/2020 1027   K 4.1 12/29/2016 1152   CL 104 01/14/2020 1027   CL 104 06/28/2012 1010   CO2 33 (H) 01/14/2020 1027   CO2 29 12/29/2016 1152   GLUCOSE 102 (H) 01/14/2020 1027   GLUCOSE 191 (H) 12/29/2016 1152   GLUCOSE 127 (H) 06/28/2012 1010   BUN 14 01/14/2020 1027   BUN 15.1 12/29/2016 1152   CREATININE 1.38 (H) 01/14/2020 1027   CREATININE 1.1 12/29/2016 1152   CALCIUM 10.1 01/14/2020 1027   CALCIUM 9.7 12/29/2016 1152   PROT 8.1 01/14/2020 1027   PROT 7.4 12/29/2016 1152   PROT 6.8 12/29/2016 1152   ALBUMIN 4.2 01/14/2020 1027   ALBUMIN 3.9 12/29/2016 1152   AST 67 (H) 01/14/2020 1027   AST 20 12/29/2016 1152   ALT 83 (H) 01/14/2020 1027   ALT 23 12/29/2016 1152   ALKPHOS 76 01/14/2020 1027   ALKPHOS 78 12/29/2016 1152   BILITOT 1.2 01/14/2020 1027   BILITOT 1.19 12/29/2016 1152   GFRNONAA 51 (L) 01/14/2020 1027   GFRAA >60 01/14/2019 1100  No results found for: SPEP, UPEP  Lab Results  Component Value Date   WBC 7.2 01/14/2020   NEUTROABS 4.8 01/14/2020   HGB 14.4 01/14/2020   HCT 42.3 01/14/2020   MCV 100.0 01/14/2020   PLT 279 01/14/2020      Chemistry      Component Value Date/Time   NA 143 01/14/2020 1027   NA 141 12/29/2016 1152   K 4.2 01/14/2020 1027   K 4.1 12/29/2016 1152   CL 104 01/14/2020 1027   CL 104 06/28/2012 1010   CO2 33 (H) 01/14/2020 1027   CO2 29 12/29/2016 1152   BUN 14 01/14/2020 1027   BUN 15.1 12/29/2016 1152   CREATININE 1.38 (H) 01/14/2020 1027   CREATININE 1.1 12/29/2016 1152      Component Value Date/Time   CALCIUM 10.1 01/14/2020 1027   CALCIUM 9.7 12/29/2016 1152   ALKPHOS 76 01/14/2020 1027   ALKPHOS 78 12/29/2016 1152   AST 67 (H) 01/14/2020 1027   AST 20 12/29/2016 1152   ALT 83 (H) 01/14/2020 1027   ALT 23 12/29/2016 1152   BILITOT 1.2 01/14/2020 1027   BILITOT 1.19 12/29/2016 1152

## 2020-01-21 NOTE — Assessment & Plan Note (Signed)
His serum creatinine was recently elevated I recommend increase oral fluid intake as tolerated This is not related to his MGUS

## 2020-01-21 NOTE — Progress Notes (Signed)
   Covid-19 Vaccination Clinic  Name:  Alter Moss    MRN: [id number] DOB: May 08, 1950  01/21/2020  Mr. Schifano was observed post Covid-19 immunization for 15 minutes without incident. He was provided with Vaccine Information Sheet and instruction to access the V-Safe system.   Mr. Clinch was instructed to call 911 with any severe reactions post vaccine: Marland Kitchen Difficulty breathing  . Swelling of face and throat  . A fast heartbeat  . A bad rash all over body  . Dizziness and weakness

## 2020-01-21 NOTE — Assessment & Plan Note (Signed)
We discussed the importance of preventive care and reviewed the vaccination programs. He does not have any prior allergic reactions to Covid-19 vaccination. He agrees to proceed with booster dose of Covid-19 vaccination today and we will administer it today at the clinic.  

## 2020-01-21 NOTE — Assessment & Plan Note (Signed)
He will continue vitamin B12 injection through his primary care doctor

## 2020-02-05 ENCOUNTER — Telehealth: Payer: Self-pay

## 2020-02-05 ENCOUNTER — Other Ambulatory Visit: Payer: Self-pay

## 2020-02-05 ENCOUNTER — Ambulatory Visit (INDEPENDENT_AMBULATORY_CARE_PROVIDER_SITE_OTHER): Payer: Medicare HMO

## 2020-02-05 DIAGNOSIS — E538 Deficiency of other specified B group vitamins: Secondary | ICD-10-CM

## 2020-02-05 MED ORDER — CYANOCOBALAMIN 1000 MCG/ML IJ SOLN
1000.0000 ug | Freq: Once | INTRAMUSCULAR | Status: AC
  Administered 2020-02-05: 1000 ug via INTRAMUSCULAR

## 2020-02-05 NOTE — Progress Notes (Signed)
B12 given to patient today in right arm. Patient tolerated injection well/cjs

## 2020-02-05 NOTE — Telephone Encounter (Signed)
Ok verbAL

## 2020-02-05 NOTE — Progress Notes (Signed)
Patient ID: Nathan Wallace, male   DOB: 18-Apr-1950, 69 y.o.   MRN: [id number] Medical screening examination/treatment/procedure(s) were performed by non-physician practitioner and as supervising physician I was immediately available for consultation/collaboration. I agree with above. Cathlean Cower, MD

## 2020-02-05 NOTE — Telephone Encounter (Signed)
Verbal order for B12 monthly injections needed.

## 2020-03-09 ENCOUNTER — Other Ambulatory Visit: Payer: Self-pay

## 2020-03-09 ENCOUNTER — Ambulatory Visit (INDEPENDENT_AMBULATORY_CARE_PROVIDER_SITE_OTHER): Payer: Medicare HMO

## 2020-03-09 DIAGNOSIS — E538 Deficiency of other specified B group vitamins: Secondary | ICD-10-CM | POA: Diagnosis not present

## 2020-03-09 MED ORDER — CYANOCOBALAMIN 1000 MCG/ML IJ SOLN
1000.0000 ug | Freq: Once | INTRAMUSCULAR | Status: AC
  Administered 2020-03-09: 1000 ug via INTRAMUSCULAR

## 2020-03-09 NOTE — Progress Notes (Signed)
Patient given B12 injection today under the orders of Dr. Cathlean Cower. Patient tolerated injection well.

## 2020-03-30 ENCOUNTER — Other Ambulatory Visit: Payer: Self-pay | Admitting: Internal Medicine

## 2020-03-30 NOTE — Telephone Encounter (Signed)
Ok to contact pt -  Unless he has been told differently, I think we can stop the iron pill, and then check iron testing with next visit

## 2020-03-31 NOTE — Telephone Encounter (Signed)
Patient has been contacted in regards to last office note it was left on his voicemail ....Marland KitchenMarland KitchenUnless he has been told differently, I think we can stop the iron pill, and then check iron testing with next visit

## 2020-04-08 ENCOUNTER — Other Ambulatory Visit: Payer: Self-pay

## 2020-04-08 ENCOUNTER — Ambulatory Visit (INDEPENDENT_AMBULATORY_CARE_PROVIDER_SITE_OTHER): Payer: Medicare HMO

## 2020-04-08 DIAGNOSIS — D518 Other vitamin B12 deficiency anemias: Secondary | ICD-10-CM | POA: Diagnosis not present

## 2020-04-08 MED ORDER — CYANOCOBALAMIN 1000 MCG/ML IJ SOLN
1000.0000 ug | Freq: Once | INTRAMUSCULAR | Status: AC
  Administered 2020-04-08: 10:00:00 1000 ug via INTRAMUSCULAR

## 2020-04-08 NOTE — Progress Notes (Signed)
Pt was given Vitamin B12 w/o any complications. 

## 2020-05-11 ENCOUNTER — Ambulatory Visit (INDEPENDENT_AMBULATORY_CARE_PROVIDER_SITE_OTHER): Payer: Medicare HMO

## 2020-05-11 ENCOUNTER — Other Ambulatory Visit: Payer: Self-pay

## 2020-05-11 DIAGNOSIS — E538 Deficiency of other specified B group vitamins: Secondary | ICD-10-CM

## 2020-05-11 MED ORDER — CYANOCOBALAMIN 1000 MCG/ML IJ SOLN
1000.0000 ug | INTRAMUSCULAR | Status: AC
  Administered 2020-05-11 – 2020-06-12 (×2): 1000 ug via INTRAMUSCULAR

## 2020-05-11 NOTE — Progress Notes (Signed)
Pt here for monthly B12 injection per dr Jenny Reichmann.  B12 1037mcg given IM left deltoid and pt tolerated injection well.

## 2020-05-13 DIAGNOSIS — C61 Malignant neoplasm of prostate: Secondary | ICD-10-CM | POA: Diagnosis not present

## 2020-05-14 ENCOUNTER — Ambulatory Visit: Payer: Medicare HMO

## 2020-05-20 DIAGNOSIS — C61 Malignant neoplasm of prostate: Secondary | ICD-10-CM | POA: Diagnosis not present

## 2020-05-20 DIAGNOSIS — N393 Stress incontinence (female) (male): Secondary | ICD-10-CM | POA: Diagnosis not present

## 2020-05-20 DIAGNOSIS — N5201 Erectile dysfunction due to arterial insufficiency: Secondary | ICD-10-CM | POA: Diagnosis not present

## 2020-05-21 ENCOUNTER — Ambulatory Visit (INDEPENDENT_AMBULATORY_CARE_PROVIDER_SITE_OTHER): Payer: Medicare HMO | Admitting: Internal Medicine

## 2020-05-21 ENCOUNTER — Other Ambulatory Visit: Payer: Self-pay

## 2020-05-21 ENCOUNTER — Encounter: Payer: Self-pay | Admitting: Internal Medicine

## 2020-05-21 VITALS — BP 140/82 | HR 64 | Temp 98.5°F | Ht 68.0 in | Wt 164.0 lb

## 2020-05-21 DIAGNOSIS — R7989 Other specified abnormal findings of blood chemistry: Secondary | ICD-10-CM

## 2020-05-21 DIAGNOSIS — Z8546 Personal history of malignant neoplasm of prostate: Secondary | ICD-10-CM | POA: Diagnosis not present

## 2020-05-21 DIAGNOSIS — E785 Hyperlipidemia, unspecified: Secondary | ICD-10-CM

## 2020-05-21 DIAGNOSIS — R748 Abnormal levels of other serum enzymes: Secondary | ICD-10-CM

## 2020-05-21 DIAGNOSIS — F32A Depression, unspecified: Secondary | ICD-10-CM | POA: Diagnosis not present

## 2020-05-21 DIAGNOSIS — E538 Deficiency of other specified B group vitamins: Secondary | ICD-10-CM

## 2020-05-21 DIAGNOSIS — I10 Essential (primary) hypertension: Secondary | ICD-10-CM | POA: Diagnosis not present

## 2020-05-21 DIAGNOSIS — Z0001 Encounter for general adult medical examination with abnormal findings: Secondary | ICD-10-CM

## 2020-05-21 DIAGNOSIS — E559 Vitamin D deficiency, unspecified: Secondary | ICD-10-CM | POA: Diagnosis not present

## 2020-05-21 DIAGNOSIS — Z0000 Encounter for general adult medical examination without abnormal findings: Secondary | ICD-10-CM

## 2020-05-21 DIAGNOSIS — E611 Iron deficiency: Secondary | ICD-10-CM | POA: Diagnosis not present

## 2020-05-21 DIAGNOSIS — R7302 Impaired glucose tolerance (oral): Secondary | ICD-10-CM | POA: Diagnosis not present

## 2020-05-21 LAB — HEMOGLOBIN A1C: Hgb A1c MFr Bld: 6.2 % (ref 4.6–6.5)

## 2020-05-21 LAB — URINALYSIS, ROUTINE W REFLEX MICROSCOPIC
Bilirubin Urine: NEGATIVE
Hgb urine dipstick: NEGATIVE
Ketones, ur: NEGATIVE
Leukocytes,Ua: NEGATIVE
Nitrite: NEGATIVE
RBC / HPF: NONE SEEN (ref 0–?)
Specific Gravity, Urine: 1.02 (ref 1.000–1.030)
Total Protein, Urine: NEGATIVE
Urine Glucose: NEGATIVE
Urobilinogen, UA: 0.2 (ref 0.0–1.0)
pH: 6.5 (ref 5.0–8.0)

## 2020-05-21 LAB — CBC WITH DIFFERENTIAL/PLATELET
Basophils Absolute: 0 10*3/uL (ref 0.0–0.1)
Basophils Relative: 0.3 % (ref 0.0–3.0)
Eosinophils Absolute: 0.1 10*3/uL (ref 0.0–0.7)
Eosinophils Relative: 1.5 % (ref 0.0–5.0)
HCT: 41.6 % (ref 39.0–52.0)
Hemoglobin: 14.1 g/dL (ref 13.0–17.0)
Lymphocytes Relative: 31.2 % (ref 12.0–46.0)
Lymphs Abs: 1.9 10*3/uL (ref 0.7–4.0)
MCHC: 34 g/dL (ref 30.0–36.0)
MCV: 100.5 fl — ABNORMAL HIGH (ref 78.0–100.0)
Monocytes Absolute: 0.5 10*3/uL (ref 0.1–1.0)
Monocytes Relative: 7.9 % (ref 3.0–12.0)
Neutro Abs: 3.6 10*3/uL (ref 1.4–7.7)
Neutrophils Relative %: 59.1 % (ref 43.0–77.0)
Platelets: 276 10*3/uL (ref 150.0–400.0)
RBC: 4.14 Mil/uL — ABNORMAL LOW (ref 4.22–5.81)
RDW: 13.2 % (ref 11.5–15.5)
WBC: 6 10*3/uL (ref 4.0–10.5)

## 2020-05-21 LAB — BASIC METABOLIC PANEL
BUN: 20 mg/dL (ref 6–23)
CO2: 31 mEq/L (ref 19–32)
Calcium: 9.9 mg/dL (ref 8.4–10.5)
Chloride: 104 mEq/L (ref 96–112)
Creatinine, Ser: 1.2 mg/dL (ref 0.40–1.50)
GFR: 61.29 mL/min (ref >60.00)
Glucose, Bld: 106 mg/dL — ABNORMAL HIGH (ref 70–99)
Potassium: 4.7 mEq/L (ref 3.5–5.1)
Sodium: 142 mEq/L (ref 135–145)

## 2020-05-21 LAB — FERRITIN: Ferritin: 179.3 ng/mL (ref 22.0–322.0)

## 2020-05-21 LAB — HEPATIC FUNCTION PANEL
ALT: 41 U/L (ref 0–53)
AST: 29 U/L (ref 0–37)
Albumin: 4.5 g/dL (ref 3.5–5.2)
Alkaline Phosphatase: 64 U/L (ref 39–117)
Bilirubin, Direct: 0.2 mg/dL (ref 0.0–0.3)
Total Bilirubin: 0.9 mg/dL (ref 0.2–1.2)
Total Protein: 7.5 g/dL (ref 6.0–8.3)

## 2020-05-21 LAB — IBC PANEL
Iron: 106 ug/dL (ref 42–165)
Saturation Ratios: 24 % (ref 20.0–50.0)
Transferrin: 316 mg/dL (ref 212.0–360.0)

## 2020-05-21 LAB — LIPID PANEL
Cholesterol: 175 mg/dL (ref 0–200)
HDL: 59.3 mg/dL
LDL Cholesterol: 97 mg/dL (ref 0–99)
NonHDL: 115.67
Total CHOL/HDL Ratio: 3
Triglycerides: 92 mg/dL (ref 0.0–149.0)
VLDL: 18.4 mg/dL (ref 0.0–40.0)

## 2020-05-21 LAB — TSH: TSH: 1.52 u[IU]/mL (ref 0.35–4.50)

## 2020-05-21 LAB — PSA: PSA: 0.1 ng/mL (ref 0.10–4.00)

## 2020-05-21 LAB — VITAMIN B12: Vitamin B-12: 399 pg/mL (ref 211–911)

## 2020-05-21 LAB — VITAMIN D 25 HYDROXY (VIT D DEFICIENCY, FRACTURES): VITD: 27.69 ng/mL — ABNORMAL LOW (ref 30.00–100.00)

## 2020-05-21 NOTE — Progress Notes (Signed)
Patient ID: Nathan Wallace, male   DOB: 15-Apr-1950, 70 y.o.   MRN: [id number]         Chief Complaint:: wellness exam and f/u b12 and D supplements, elevated creatinine, elevated LFTs, hyperglycemia, htn, hld , hx of prostate ca, depression, iron deficiency       HPI:  Nathan Wallace is a 70 y.o. male here for wellness exam; has not taken asa 81 mg for 6 mo, wants to stay off ; also never took the celexa, o/w up to datef. Conts to wear bilateal wrist splints, may need left wrist surgury at some point.  Seen per urology recently, PSA essentially zero post prostatectomy, may be considering a new type of surgury for ED.  Tolerating b12 and D supplements.  Denies worsening reflux, abd pain, dysphagia, n/v, bowel change or blood.  Denies urinary symptoms such as dysuria, frequency, urgency, flank pain, hematuria or n/v, fever, chills.  Pt denies chest pain, increased sob or doe, wheezing, orthopnea, PND, increased LE swelling, palpitations, dizziness or syncope.   Wt Readings from Last 3 Encounters:  05/21/20 164 lb (74.4 kg)  01/21/20 156 lb (70.8 kg)  05/28/19 164 lb 3.2 oz (74.5 kg)   BP Readings from Last 3 Encounters:  05/21/20 140/82  01/21/20 138/80  05/28/19 118/68   Immunization History  Administered Date(s) Administered  . Fluad Quad(high Dose 65+) 12/06/2018, 01/06/2020  . Influenza Split 12/19/2011  . Influenza, High Dose Seasonal PF 12/25/2015, 02/02/2017, 01/05/2018  . Influenza,inj,Quad PF,6+ Mos 02/28/2013, 04/09/2014, 01/05/2015  . PFIZER(Purple Top)SARS-COV-2 Vaccination 05/24/2019, 06/18/2019, 01/21/2020  . Pneumococcal Conjugate-13 05/18/2017  . Pneumococcal Polysaccharide-23 05/22/2018  . Td 08/26/2008  . Tdap 02/23/2012  . Zoster 07/26/2012  There are no preventive care reminders to display for this patient.         Past Medical History:  Diagnosis Date  . AKI (acute kidney injury) ([place])   . Arthritis    RT KNEE AND RT SHOULDER  . B12 deficiency   . Benign head  tremor   . Chest tightness    comes and goes   . CVA (cerebrovascular accident due to intracerebral hemorrhage) ([place]) 08/03/2011  . Degenerative joint disease of knee, right   . Depression 07/26/2012   no problems now  . Diverticulosis   . GERD (gastroesophageal reflux disease)    NO RECENT PROBLEMS AND NO MEDS  . History of blood transfusion   . History of kidney stones   . History of urinary tract infection   . Hyperlipidemia    border line  . Hypertension   . Impaired glucose tolerance 06/22/2011  . Left inguinal hernia   . Macrocytic anemia   . MGUS (monoclonal gammopathy of unknown significance)   . Prostate cancer ([place]) 2011  . Rotator cuff tear arthropathy of right shoulder, with instability 10/25/2016  . Sepsis ([place])   . Stress incontinence   . Stroke [facility])    2013  . Syncope 2012   after prostate surgery; neg neurology work up.    Past Surgical History:  Procedure Laterality Date  . COLONOSCOPY    . INGUINAL HERNIA REPAIR  06/01/2011   Procedure: HERNIA REPAIR INGUINAL ADULT;  Surgeon: Adin Hector, MD;  Location: [facility];  Service: General;  Laterality: Left;  Left Inguinal Hernia Repair with Mesh  . ROBOT ASSISTED LAPAROSCOPIC RADICAL PROSTATECTOMY  jan 2012   da vinci  . SPINE SURGERY  2003 OR 2004   CERVICAL FUSION-STATES LIMITED ROM NECK  . TOTAL  SHOULDER ARTHROPLASTY Right 10/25/2016  . TOTAL SHOULDER ARTHROPLASTY Right 10/25/2016   Procedure: RIGHT TOTAL SHOULDER ARTHROPLASTY;  Surgeon: Marchia Bond, MD;  Location: [place];  Service: Orthopedics;  Laterality: Right;  . URETHRAL SLING N/A 03/08/2016   Procedure: MALE SLING CYSTOSCOPY;  Surgeon: Bjorn Loser, MD;  Location: [facility];  Service: Urology;  Laterality: N/A;  . WRIST SURGERY     right     reports that he quit smoking about 53 years ago. His smoking use included cigarettes. He has a 0.50 pack-year smoking history. He has never used smokeless tobacco. He reports current alcohol use. He reports  previous drug use. Drug: Marijuana. family history includes Aneurysm in his mother; Colon cancer (age of onset: 10) in his father; Heart attack in his brother; Hypertension in his mother. Allergies  Allergen Reactions  . No Known Allergies   . Nsaids Other (See Comments)    Gastric ulcer   Current Outpatient Medications on File Prior to Visit  Medication Sig Dispense Refill  . allopurinol (ZYLOPRIM) 300 MG tablet TAKE 1 TABLET (300 MG TOTAL) BY MOUTH DAILY. 90 tablet 3  . amLODipine (NORVASC) 5 MG tablet TAKE 1 TABLET (5 MG TOTAL) BY MOUTH DAILY. 90 tablet 3  . Ascorbic Acid (VITAMIN C WITH ROSE HIPS) 1000 MG tablet Take 1 tablet (1,000 mg total) by mouth daily. 90 tablet 0  . atorvastatin (LIPITOR) 40 MG tablet TAKE 1 TABLET (40 MG TOTAL) BY MOUTH DAILY. 90 tablet 3  . Cholecalciferol (VITAMIN D-3) 1000 units CAPS Take 2 capsules (2,000 Units total) by mouth daily. 180 capsule 0  . colchicine 0.6 MG tablet Take 1 tablet (0.6 mg total) by mouth 2 (two) times daily. For 5 days when having flare. 180 tablet 1  . Cyanocobalamin (B-12 COMPLIANCE INJECTION IJ) Inject 1 Dose as directed every 30 (thirty) days.    Marland Kitchen gabapentin (NEURONTIN) 300 MG capsule TAKE 2 CAPSULES BY MOUTH 3  TIMES DAILY 540 capsule 1  . pantoprazole (PROTONIX) 40 MG tablet TAKE 1 TABLET BY MOUTH EVERY DAY 90 tablet 2  . PRESCRIPTION MEDICATION 1 Syringe by Intracavernosal route every other day as needed (10-30 minutes before you have sex for ED). Matrix (Papaverine 30mg  Phentolamine 1mg  Alprostadil 29mcg per ml injection)  erectile dysfunction injection every other day as needed for sex    . tiZANidine (ZANAFLEX) 4 MG tablet TAKE 1 TABLET BY MOUTH 3  TIMES DAILY AS NEEDED FOR  MUSCLE SPASM(S) 270 tablet 1  . traMADol (ULTRAM) 50 MG tablet Take 1 tablet (50 mg total) by mouth every 6 (six) hours as needed. 30 tablet 0   Current Facility-Administered Medications on File Prior to Visit  Medication Dose Route Frequency Provider  Last Rate Last Admin  . cyanocobalamin ((VITAMIN B-12)) injection 1,000 mcg  1,000 mcg Intramuscular Q30 days Biagio Borg, MD   1,000 mcg at 05/11/20 0943        ROS:  All others reviewed and negative.  Objective        PE:  BP 140/82   Pulse 64   Temp 98.5 F (36.9 C) (Oral)   Ht 5\' 8"  (1.727 m)   Wt 164 lb (74.4 kg)   SpO2 98%   BMI 24.94 kg/m                 Constitutional: Pt appears in NAD               HENT: Head: NCAT.  Right Ear: External ear normal.                 Left Ear: External ear normal.                Eyes: . Pupils are equal, round, and reactive to light. Conjunctivae and EOM are normal               Nose: without d/c or deformity               Neck: Neck supple. Gross normal ROM               Cardiovascular: Normal rate and regular rhythm.                 Pulmonary/Chest: Effort normal and breath sounds without rales or wheezing.                Abd:  Soft, NT, ND, + BS, no organomegaly               Neurological: Pt is alert. At baseline orientation, motor grossly intact               Skin: Skin is warm. No rashes, no other new lesions, LE edema - none               Psychiatric: Pt behavior is normal without agitation   Micro: none  Cardiac tracings I have personally interpreted today:  none  Pertinent Radiological findings (summarize): none   Lab Results  Component Value Date   WBC 6.0 05/21/2020   HGB 14.1 05/21/2020   HCT 41.6 05/21/2020   PLT 276.0 05/21/2020   GLUCOSE 106 (H) 05/21/2020   CHOL 175 05/21/2020   TRIG 92.0 05/21/2020   HDL 59.30 05/21/2020   LDLDIRECT 87.9 12/19/2011   LDLCALC 97 05/21/2020   ALT 41 05/21/2020   AST 29 05/21/2020   NA 142 05/21/2020   K 4.7 05/21/2020   CL 104 05/21/2020   CREATININE 1.20 05/21/2020   BUN 20 05/21/2020   CO2 31 05/21/2020   TSH 1.52 05/21/2020   PSA 0.10 05/21/2020   INR 1.12 03/02/2016   HGBA1C 6.2 05/21/2020   Assessment/Plan:  Azad Calame is a 70 y.o. Black or  African American [2] male with  has a past medical history of AKI (acute kidney injury) ([place]), Arthritis, B12 deficiency, Benign head tremor, Chest tightness, CVA (cerebrovascular accident due to intracerebral hemorrhage) ([place]) (08/03/2011), Degenerative joint disease of knee, right, Depression (07/26/2012), Diverticulosis, GERD (gastroesophageal reflux disease), History of blood transfusion, History of kidney stones, History of urinary tract infection, Hyperlipidemia, Hypertension, Impaired glucose tolerance (06/22/2011), Left inguinal hernia, Macrocytic anemia, MGUS (monoclonal gammopathy of unknown significance), Prostate cancer ([place]) (2011), Rotator cuff tear arthropathy of right shoulder, with instability (10/25/2016), Sepsis ([place]), Stress incontinence, Stroke ([place]), and Syncope (2012). Elevated serum creatinine Etiology unclear from oct 2021; for f/u lab, consider renal u/s  Elevated liver enzymes Mild as per recent labs, consider abd u/s, for f/u lab today  Depression Mild persistent, declines celexa for now or referral for counseling  Encounter for well adult exam with abnormal findings Age and sex appropriate education and counseling updated with regular exercise and diet Referrals for preventative services - none needed Immunizations addressed - none needed Smoking counseling  - none needed Evidence for depression or other mood disorder - none significant Most recent labs reviewed. I have personally reviewed and have noted: 1) the patient's medical and social history  2) The patient's current medications and supplements 3) The patient's height, weight, and BMI have been recorded in the chart   History of prostate cancer For f/u psa, asymt, f/u urology as planned  B12 deficiency Lab Results  Component Value Date   VITAMINB12 399 05/21/2020   Stable, cont oral replacement - b12 1000 mcg qd   HTN (hypertension) BP Readings from Last 3 Encounters:  05/21/20 140/82  01/21/20  138/80  05/28/19 118/68   Stable, pt to continue medical treatment - diet and wt control   Hyperlipidemia Lab Results  Component Value Date   LDLCALC 97 05/21/2020   Stable, pt to continue current statin lipitor 40   Impaired glucose tolerance Lab Results  Component Value Date   HGBA1C 6.2 05/21/2020   Stable, pt to continue current medical treatment  - diet   Iron deficiency For f/u iron labs, no overt bleeding  Followup: Return in about 6 months (around 11/18/2020).  Cathlean Cower, MD 05/25/2020 10:39 PM [facility]

## 2020-05-21 NOTE — Assessment & Plan Note (Signed)
Etiology unclear from oct 2021; for f/u lab, consider renal u/s

## 2020-05-21 NOTE — Patient Instructions (Signed)

## 2020-05-21 NOTE — Assessment & Plan Note (Signed)
Mild as per recent labs, consider abd u/s, for f/u lab today

## 2020-05-21 NOTE — Assessment & Plan Note (Signed)
Mild persistent, declines celexa for now or referral for counseling

## 2020-05-23 ENCOUNTER — Encounter: Payer: Self-pay | Admitting: Internal Medicine

## 2020-05-25 ENCOUNTER — Encounter: Payer: Self-pay | Admitting: Internal Medicine

## 2020-05-25 NOTE — Assessment & Plan Note (Signed)
For f/u iron labs, no overt bleeding

## 2020-05-25 NOTE — Assessment & Plan Note (Signed)
Lab Results  Component Value Date   VITAMINB12 399 05/21/2020   Stable, cont oral replacement - b12 1000 mcg qd

## 2020-05-25 NOTE — Assessment & Plan Note (Signed)

## 2020-05-25 NOTE — Assessment & Plan Note (Signed)
Lab Results  Component Value Date   HGBA1C 6.2 05/21/2020   Stable, pt to continue current medical treatment  - diet

## 2020-05-25 NOTE — Assessment & Plan Note (Signed)
Lab Results  Component Value Date   LDLCALC 97 05/21/2020   Stable, pt to continue current statin lipitor 40

## 2020-05-25 NOTE — Assessment & Plan Note (Signed)
BP Readings from Last 3 Encounters:  05/21/20 140/82  01/21/20 138/80  05/28/19 118/68   Stable, pt to continue medical treatment - diet and wt control

## 2020-05-25 NOTE — Assessment & Plan Note (Signed)
For f/u psa, asymt, f/u urology as planned

## 2020-05-28 DIAGNOSIS — M6289 Other specified disorders of muscle: Secondary | ICD-10-CM | POA: Diagnosis not present

## 2020-05-28 DIAGNOSIS — M6281 Muscle weakness (generalized): Secondary | ICD-10-CM | POA: Diagnosis not present

## 2020-05-28 DIAGNOSIS — N393 Stress incontinence (female) (male): Secondary | ICD-10-CM | POA: Diagnosis not present

## 2020-05-28 DIAGNOSIS — M62838 Other muscle spasm: Secondary | ICD-10-CM | POA: Diagnosis not present

## 2020-06-01 ENCOUNTER — Other Ambulatory Visit: Payer: Self-pay | Admitting: Internal Medicine

## 2020-06-10 DIAGNOSIS — R300 Dysuria: Secondary | ICD-10-CM | POA: Diagnosis not present

## 2020-06-10 DIAGNOSIS — M6289 Other specified disorders of muscle: Secondary | ICD-10-CM | POA: Diagnosis not present

## 2020-06-10 DIAGNOSIS — N393 Stress incontinence (female) (male): Secondary | ICD-10-CM | POA: Diagnosis not present

## 2020-06-10 DIAGNOSIS — M6281 Muscle weakness (generalized): Secondary | ICD-10-CM | POA: Diagnosis not present

## 2020-06-10 DIAGNOSIS — N3946 Mixed incontinence: Secondary | ICD-10-CM | POA: Diagnosis not present

## 2020-06-10 DIAGNOSIS — M62838 Other muscle spasm: Secondary | ICD-10-CM | POA: Diagnosis not present

## 2020-06-11 ENCOUNTER — Ambulatory Visit: Payer: Medicare HMO

## 2020-06-11 DIAGNOSIS — N5201 Erectile dysfunction due to arterial insufficiency: Secondary | ICD-10-CM | POA: Diagnosis not present

## 2020-06-12 ENCOUNTER — Other Ambulatory Visit: Payer: Self-pay

## 2020-06-12 ENCOUNTER — Ambulatory Visit (INDEPENDENT_AMBULATORY_CARE_PROVIDER_SITE_OTHER): Payer: Medicare HMO

## 2020-06-12 ENCOUNTER — Ambulatory Visit: Payer: Medicare HMO

## 2020-06-12 DIAGNOSIS — E538 Deficiency of other specified B group vitamins: Secondary | ICD-10-CM | POA: Diagnosis not present

## 2020-06-12 NOTE — Progress Notes (Signed)
B12 given Please cosign 

## 2020-07-07 DIAGNOSIS — M62838 Other muscle spasm: Secondary | ICD-10-CM | POA: Diagnosis not present

## 2020-07-07 DIAGNOSIS — M6281 Muscle weakness (generalized): Secondary | ICD-10-CM | POA: Diagnosis not present

## 2020-07-07 DIAGNOSIS — M6289 Other specified disorders of muscle: Secondary | ICD-10-CM | POA: Diagnosis not present

## 2020-07-07 DIAGNOSIS — N3946 Mixed incontinence: Secondary | ICD-10-CM | POA: Diagnosis not present

## 2020-07-07 DIAGNOSIS — N393 Stress incontinence (female) (male): Secondary | ICD-10-CM | POA: Diagnosis not present

## 2020-07-07 DIAGNOSIS — R300 Dysuria: Secondary | ICD-10-CM | POA: Diagnosis not present

## 2020-07-07 DIAGNOSIS — R350 Frequency of micturition: Secondary | ICD-10-CM | POA: Diagnosis not present

## 2020-07-13 ENCOUNTER — Other Ambulatory Visit: Payer: Self-pay

## 2020-07-13 ENCOUNTER — Ambulatory Visit (INDEPENDENT_AMBULATORY_CARE_PROVIDER_SITE_OTHER): Payer: Medicare HMO

## 2020-07-13 ENCOUNTER — Telehealth: Payer: Self-pay

## 2020-07-13 DIAGNOSIS — E538 Deficiency of other specified B group vitamins: Secondary | ICD-10-CM

## 2020-07-13 MED ORDER — CYANOCOBALAMIN 1000 MCG/ML IJ SOLN
1000.0000 ug | Freq: Once | INTRAMUSCULAR | Status: AC
  Administered 2020-07-13: 1000 ug via INTRAMUSCULAR

## 2020-07-13 NOTE — Progress Notes (Signed)
Pt here for monthly B12 injection per Jenny Reichmann  B12 1031mcg given IM,  Given in right deltoid and pt tolerated injection well.  Next B12 injection scheduled for 08/12/20

## 2020-07-13 NOTE — Telephone Encounter (Signed)
Order clarified per Dr Jenny Reichmann: Pt to continue monthly b12 injections for 6 months.

## 2020-07-29 ENCOUNTER — Other Ambulatory Visit: Payer: Self-pay | Admitting: Internal Medicine

## 2020-07-30 DIAGNOSIS — R350 Frequency of micturition: Secondary | ICD-10-CM | POA: Diagnosis not present

## 2020-07-30 DIAGNOSIS — M6281 Muscle weakness (generalized): Secondary | ICD-10-CM | POA: Diagnosis not present

## 2020-07-30 DIAGNOSIS — M62838 Other muscle spasm: Secondary | ICD-10-CM | POA: Diagnosis not present

## 2020-07-30 DIAGNOSIS — N393 Stress incontinence (female) (male): Secondary | ICD-10-CM | POA: Diagnosis not present

## 2020-07-30 DIAGNOSIS — M6289 Other specified disorders of muscle: Secondary | ICD-10-CM | POA: Diagnosis not present

## 2020-08-12 ENCOUNTER — Ambulatory Visit (INDEPENDENT_AMBULATORY_CARE_PROVIDER_SITE_OTHER): Payer: Medicare HMO

## 2020-08-12 ENCOUNTER — Other Ambulatory Visit: Payer: Self-pay

## 2020-08-12 DIAGNOSIS — E538 Deficiency of other specified B group vitamins: Secondary | ICD-10-CM

## 2020-08-12 MED ORDER — CYANOCOBALAMIN 1000 MCG/ML IJ SOLN
1000.0000 ug | INTRAMUSCULAR | Status: AC
  Administered 2020-08-12 – 2023-12-12 (×7): 1000 ug via INTRAMUSCULAR

## 2020-08-12 NOTE — Progress Notes (Signed)
Pt here for monthly B12 injection per Dr.John  B12 1063mcg given right deltoid IM, and pt tolerated injection well.  Next B12 injection scheduled for 09/14/20

## 2020-08-26 ENCOUNTER — Other Ambulatory Visit: Payer: Self-pay

## 2020-08-26 NOTE — Telephone Encounter (Signed)
Left patient a voicemail to call office back to schedule and appointment. An appointment is needed for future refills.

## 2020-08-27 ENCOUNTER — Other Ambulatory Visit: Payer: Self-pay | Admitting: Internal Medicine

## 2020-08-27 NOTE — Telephone Encounter (Signed)
Please refill as per office routine med refill policy (all routine meds refilled for 3 mo or monthly per pt preference up to one year from last visit, then month to month grace period for 3 mo, then further med refills will have to be denied)  

## 2020-09-02 DIAGNOSIS — R350 Frequency of micturition: Secondary | ICD-10-CM | POA: Diagnosis not present

## 2020-09-02 DIAGNOSIS — N393 Stress incontinence (female) (male): Secondary | ICD-10-CM | POA: Diagnosis not present

## 2020-09-02 DIAGNOSIS — N3946 Mixed incontinence: Secondary | ICD-10-CM | POA: Diagnosis not present

## 2020-09-02 DIAGNOSIS — M6281 Muscle weakness (generalized): Secondary | ICD-10-CM | POA: Diagnosis not present

## 2020-09-14 ENCOUNTER — Other Ambulatory Visit: Payer: Self-pay

## 2020-09-14 ENCOUNTER — Ambulatory Visit (INDEPENDENT_AMBULATORY_CARE_PROVIDER_SITE_OTHER): Payer: Medicare HMO

## 2020-09-14 DIAGNOSIS — E538 Deficiency of other specified B group vitamins: Secondary | ICD-10-CM

## 2020-09-14 MED ORDER — CYANOCOBALAMIN 1000 MCG/ML IJ SOLN
1000.0000 ug | Freq: Once | INTRAMUSCULAR | Status: AC
  Administered 2020-09-14: 1000 ug via INTRAMUSCULAR

## 2020-09-14 NOTE — Progress Notes (Addendum)
Patient here for monthly B12 injection per Dr. Jenny Reichmann  B12 1065mcg given right deltoid IM, and patient tolerated injection well.   Next B12 injection scheduled 10/15/20

## 2020-10-15 ENCOUNTER — Other Ambulatory Visit: Payer: Self-pay

## 2020-10-15 ENCOUNTER — Ambulatory Visit (INDEPENDENT_AMBULATORY_CARE_PROVIDER_SITE_OTHER): Payer: Medicare HMO

## 2020-10-15 DIAGNOSIS — E538 Deficiency of other specified B group vitamins: Secondary | ICD-10-CM | POA: Diagnosis not present

## 2020-10-15 NOTE — Progress Notes (Signed)
Pt here for monthly B12 injection per Dr Jenny Reichmann.  B12 1048mcg given IM in right deltoid and pt tolerated injection well.  Next B12 injection scheduled for 11/19/20 during OV.

## 2020-11-19 ENCOUNTER — Other Ambulatory Visit: Payer: Self-pay

## 2020-11-19 ENCOUNTER — Ambulatory Visit (INDEPENDENT_AMBULATORY_CARE_PROVIDER_SITE_OTHER): Payer: Medicare HMO | Admitting: Internal Medicine

## 2020-11-19 ENCOUNTER — Encounter: Payer: Self-pay | Admitting: Internal Medicine

## 2020-11-19 VITALS — BP 130/82 | HR 64 | Ht 68.0 in | Wt 158.0 lb

## 2020-11-19 DIAGNOSIS — R7302 Impaired glucose tolerance (oral): Secondary | ICD-10-CM

## 2020-11-19 DIAGNOSIS — E559 Vitamin D deficiency, unspecified: Secondary | ICD-10-CM | POA: Diagnosis not present

## 2020-11-19 DIAGNOSIS — I10 Essential (primary) hypertension: Secondary | ICD-10-CM | POA: Diagnosis not present

## 2020-11-19 DIAGNOSIS — D518 Other vitamin B12 deficiency anemias: Secondary | ICD-10-CM

## 2020-11-19 DIAGNOSIS — E538 Deficiency of other specified B group vitamins: Secondary | ICD-10-CM | POA: Diagnosis not present

## 2020-11-19 DIAGNOSIS — M1711 Unilateral primary osteoarthritis, right knee: Secondary | ICD-10-CM

## 2020-11-19 DIAGNOSIS — Z0000 Encounter for general adult medical examination without abnormal findings: Secondary | ICD-10-CM

## 2020-11-19 MED ORDER — CYANOCOBALAMIN 1000 MCG/ML IJ SOLN
1000.0000 ug | Freq: Once | INTRAMUSCULAR | Status: AC
  Administered 2020-11-19: 1000 ug via INTRAMUSCULAR

## 2020-11-19 NOTE — Progress Notes (Signed)
Patient ID: Nathan Wallace, male   DOB: [birth year], 70 y.o.   MRN: [id number]        Chief Complaint: follow up htn, hyperglycemia, vit d deficiency, b12 deficiency        HPI:  Bejamin Wallace is a 70 y.o. male here overall doing ok,  Pt denies chest pain, increased sob or doe, wheezing, orthopnea, PND, increased LE swelling, palpitations, dizziness or syncope.   Pt denies polydipsia, polyuria, or new focal neuro s/s.    Pt denies fever, wt loss, night sweats, loss of appetite, or other constitutional symptoms  Due for B12 shot today. Not taking Vit D.  Has ongoing end stage right knee djd, much worse pain to having to spend any time on his knees working as he to do sometimes.  No other new complaints     Wt Readings from Last 3 Encounters:  11/19/20 158 lb (71.7 kg)  05/21/20 164 lb (74.4 kg)  01/21/20 156 lb (70.8 kg)   BP Readings from Last 3 Encounters:  11/19/20 130/82  05/21/20 140/82  01/21/20 138/80         Past Medical History:  Diagnosis Date   AKI (acute kidney injury) ([place])    Arthritis    RT KNEE AND RT SHOULDER   B12 deficiency    Benign head tremor    Chest tightness    comes and goes    CVA (cerebrovascular accident due to intracerebral hemorrhage) ([place]) 08/03/2011   Degenerative joint disease of knee, right    Depression 07/26/2012   no problems now   Diverticulosis    GERD (gastroesophageal reflux disease)    NO RECENT PROBLEMS AND NO MEDS   History of blood transfusion    History of kidney stones    History of urinary tract infection    Hyperlipidemia    border line   Hypertension    Impaired glucose tolerance 06/22/2011   Left inguinal hernia    Macrocytic anemia    MGUS (monoclonal gammopathy of unknown significance)    Prostate cancer ([facility]) 2011   Rotator cuff tear arthropathy of right shoulder, with instability 10/25/2016   Sepsis ([place])    Stress incontinence    Stroke [facility])    2013   Syncope 2012   after prostate surgery; neg neurology work up.     Past Surgical History:  Procedure Laterality Date   COLONOSCOPY     INGUINAL HERNIA REPAIR  06/01/2011   Procedure: HERNIA REPAIR INGUINAL ADULT;  Surgeon: Adin Hector, MD;  Location: [facility];  Service: General;  Laterality: Left;  Left Inguinal Hernia Repair with Mesh   ROBOT ASSISTED LAPAROSCOPIC RADICAL PROSTATECTOMY  jan 2012   da vinci   SPINE SURGERY  2003 OR 2004   CERVICAL FUSION-STATES LIMITED ROM NECK   TOTAL SHOULDER ARTHROPLASTY Right 10/25/2016   TOTAL SHOULDER ARTHROPLASTY Right 10/25/2016   Procedure: RIGHT TOTAL SHOULDER ARTHROPLASTY;  Surgeon: Marchia Bond, MD;  Location: [facility];  Service: Orthopedics;  Laterality: Right;   URETHRAL SLING N/A 03/08/2016   Procedure: MALE SLING CYSTOSCOPY;  Surgeon: Bjorn Loser, MD;  Location: [facility];  Service: Urology;  Laterality: N/A;   WRIST SURGERY     right     reports that he quit smoking about 54 years ago. His smoking use included cigarettes. He has a 0.50 pack-year smoking history. He has never used smokeless tobacco. He reports current alcohol use. He reports that he does not currently use drugs after having used  the following drugs: Marijuana. family history includes Aneurysm in his mother; Colon cancer (age of onset: 41) in his father; Heart attack in his brother; Hypertension in his mother. Allergies  Allergen Reactions   No Known Allergies    Nsaids Other (See Comments)    Gastric ulcer   Current Outpatient Medications on File Prior to Visit  Medication Sig Dispense Refill   allopurinol (ZYLOPRIM) 300 MG tablet TAKE 1 TABLET (300 MG TOTAL) BY MOUTH DAILY. 90 tablet 3   amLODipine (NORVASC) 5 MG tablet TAKE 1 TABLET BY MOUTH EVERY DAY 90 tablet 1   atorvastatin (LIPITOR) 40 MG tablet TAKE 1 TABLET (40 MG TOTAL) BY MOUTH DAILY. 90 tablet 3   Cholecalciferol (VITAMIN D-3) 1000 units CAPS Take 2 capsules (2,000 Units total) by mouth daily. 180 capsule 0   colchicine 0.6 MG tablet Take 1 tablet (0.6 mg total) by  mouth 2 (two) times daily. For 5 days when having flare. 180 tablet 1   Cyanocobalamin (B-12 COMPLIANCE INJECTION IJ) Inject 1 Dose as directed every 30 (thirty) days.     gabapentin (NEURONTIN) 300 MG capsule TAKE 2 CAPSULES BY MOUTH 3 TIMES A DAY 90 capsule 5   pantoprazole (PROTONIX) 40 MG tablet TAKE 1 TABLET BY MOUTH EVERY DAY 90 tablet 2   PRESCRIPTION MEDICATION 1 Syringe by Intracavernosal route every other day as needed (10-30 minutes before you have sex for ED). Matrix (Papaverine '30mg'$  Phentolamine '1mg'$  Alprostadil 31mg per ml injection)  erectile dysfunction injection every other day as needed for sex     tiZANidine (ZANAFLEX) 4 MG tablet TAKE 1 TABLET BY MOUTH 3  TIMES DAILY AS NEEDED FOR  MUSCLE SPASM(S) 270 tablet 1   traMADol (ULTRAM) 50 MG tablet Take 1 tablet (50 mg total) by mouth every 6 (six) hours as needed. 30 tablet 0   Ascorbic Acid (VITAMIN C WITH ROSE HIPS) 1000 MG tablet Take 1 tablet (1,000 mg total) by mouth daily. (Patient not taking: Reported on 11/19/2020) 90 tablet 0   PFIZER-BIONT COVID-19 VAC-TRIS SUSP injection      Current Facility-Administered Medications on File Prior to Visit  Medication Dose Route Frequency Provider Last Rate Last Admin   cyanocobalamin ((VITAMIN B-12)) injection 1,000 mcg  1,000 mcg Intramuscular Q30 days JBiagio Borg MD   1,000 mcg at 10/15/20 0905        ROS:  All others reviewed and negative.  Objective        PE:  BP 130/82 (BP Location: Left Arm, Patient Position: Sitting, Cuff Size: Normal)   Pulse 64   Ht '5\' 8"'$  (1.727 m)   Wt 158 lb (71.7 kg)   SpO2 98%   BMI 24.02 kg/m                 Constitutional: Pt appears in NAD               HENT: Head: NCAT.                Right Ear: External ear normal.                 Left Ear: External ear normal.                Eyes: . Pupils are equal, round, and reactive to light. Conjunctivae and EOM are normal               Nose: without d/c or deformity  Neck: Neck  supple. Gross normal ROM               Cardiovascular: Normal rate and regular rhythm.                 Pulmonary/Chest: Effort normal and breath sounds without rales or wheezing.                Abd:  Soft, NT, ND, + BS, no organomegaly               Neurological: Pt is alert. At baseline orientation, motor grossly intact               Skin: Skin is warm. No rashes, no other new lesions, LE edema - none               Psychiatric: Pt behavior is normal without agitation   Micro: none  Cardiac tracings I have personally interpreted today:  none  Pertinent Radiological findings (summarize): none   Lab Results  Component Value Date   WBC 6.0 05/21/2020   HGB 14.1 05/21/2020   HCT 41.6 05/21/2020   PLT 276.0 05/21/2020   GLUCOSE 106 (H) 05/21/2020   CHOL 175 05/21/2020   TRIG 92.0 05/21/2020   HDL 59.30 05/21/2020   LDLDIRECT 87.9 12/19/2011   LDLCALC 97 05/21/2020   ALT 41 05/21/2020   AST 29 05/21/2020   NA 142 05/21/2020   K 4.7 05/21/2020   CL 104 05/21/2020   CREATININE 1.20 05/21/2020   BUN 20 05/21/2020   CO2 31 05/21/2020   TSH 1.52 05/21/2020   PSA 0.10 05/21/2020   INR 1.12 03/02/2016   HGBA1C 6.2 05/21/2020   Assessment/Plan:  Kenyen Ceron is a 70 y.o. Black or African American [2] male with  has a past medical history of AKI (acute kidney injury) ([place]), Arthritis, B12 deficiency, Benign head tremor, Chest tightness, CVA (cerebrovascular accident due to intracerebral hemorrhage) ([place]) (08/03/2011), Degenerative joint disease of knee, right, Depression (07/26/2012), Diverticulosis, GERD (gastroesophageal reflux disease), History of blood transfusion, History of kidney stones, History of urinary tract infection, Hyperlipidemia, Hypertension, Impaired glucose tolerance (06/22/2011), Left inguinal hernia, Macrocytic anemia, MGUS (monoclonal gammopathy of unknown significance), Prostate cancer ([place]) (2011), Rotator cuff tear arthropathy of right shoulder, with instability  (10/25/2016), Sepsis ([place]), Stress incontinence, Stroke ([place]), and Syncope (2012).  Vitamin D deficiency Last vitamin D Lab Results  Component Value Date   VD25OH 27.69 (L) 05/21/2020   Low, to start oral replacement   Impaired glucose tolerance Lab Results  Component Value Date   HGBA1C 6.2 05/21/2020   Stable, pt to continue current medical treatment  - diet   HTN (hypertension) BP Readings from Last 3 Encounters:  11/19/20 130/82  05/21/20 140/82  01/21/20 138/80   Stable, pt to continue medical treatment norvasc, also for cardiac CT score testing   B12 deficiency For B12 IM today and monthly,  to f/u any worsening symptoms or concerns  Degenerative joint disease of knee, right Now end stage but still working, for knee cushions to work on knees, volt gel prn, f/u ortho as planned Followup: Return in about 6 months (around 05/22/2021).  Cathlean Cower, MD 11/24/2020 8:13 PM [facility]

## 2020-11-19 NOTE — Assessment & Plan Note (Signed)
Last vitamin D Lab Results  Component Value Date   VD25OH 27.69 (L) 05/21/2020   Low, to start oral replacement

## 2020-11-19 NOTE — Patient Instructions (Signed)
We have discussed the Cardiac CT Score test to measure the calcification level (if any) in your heart arteries.  This test has been ordered in our [place], so please call  CT directly, as they prefer this, at [phone] to be scheduled.  Ok to also use the OTC Voltaren gel for the knee arthritisi  Please continue all other medications as before, and refills have been done if requested.  Please have the pharmacy call with any other refills you may need.  Please continue your efforts at being more active, low cholesterol diet, and weight control.  Please keep your appointments with your specialists as you may have planned  Please make an Appointment to return in 6 months, or sooner if needed, also with Lab Appointment for testing done 3-5 days before at the [facility] (so this is for TWO appointments - please see the scheduling desk as you leave)  Due to the ongoing Covid 19 pandemic, our lab now requires an appointment for any labs done at our office.  If you need labs done and do not have an appointment, please call our office ahead of time to schedule before presenting to the lab for your testing.

## 2020-11-24 ENCOUNTER — Encounter: Payer: Self-pay | Admitting: Internal Medicine

## 2020-11-24 NOTE — Assessment & Plan Note (Signed)
For B12 IM today and monthly,  to f/u any worsening symptoms or concerns

## 2020-11-24 NOTE — Assessment & Plan Note (Addendum)
BP Readings from Last 3 Encounters:  11/19/20 130/82  05/21/20 140/82  01/21/20 138/80   Stable, pt to continue medical treatment norvasc, also for cardiac CT score testing

## 2020-11-24 NOTE — Addendum Note (Signed)
Addended by: Biagio Borg on: 11/24/2020 08:15 PM   Modules accepted: Orders

## 2020-11-24 NOTE — Assessment & Plan Note (Signed)
Now end stage but still working, for knee cushions to work on knees, volt gel prn, f/u ortho as planned

## 2020-11-24 NOTE — Assessment & Plan Note (Signed)
Lab Results  Component Value Date   HGBA1C 6.2 05/21/2020   Stable, pt to continue current medical treatment  - diet

## 2020-11-25 ENCOUNTER — Other Ambulatory Visit: Payer: Self-pay | Admitting: Internal Medicine

## 2020-11-25 MED ORDER — PANTOPRAZOLE SODIUM 40 MG PO TBEC: 40.0000 mg | DELAYED_RELEASE_TABLET | Freq: Every day | ORAL | 2 refills | Status: DC

## 2020-11-25 NOTE — Addendum Note (Signed)
Addended by: Biagio Borg on: 11/25/2020 05:29 PM   Modules accepted: Orders

## 2020-11-30 ENCOUNTER — Ambulatory Visit: Payer: Medicare HMO

## 2020-11-30 ENCOUNTER — Telehealth: Payer: Self-pay | Admitting: Internal Medicine

## 2020-11-30 NOTE — Telephone Encounter (Signed)
   Please return call to patient to discuss 8/22 appointment

## 2020-12-04 ENCOUNTER — Encounter: Payer: Self-pay | Admitting: Internal Medicine

## 2020-12-07 ENCOUNTER — Ambulatory Visit: Payer: Medicare HMO

## 2020-12-21 ENCOUNTER — Ambulatory Visit (INDEPENDENT_AMBULATORY_CARE_PROVIDER_SITE_OTHER): Payer: Medicare HMO

## 2020-12-21 ENCOUNTER — Other Ambulatory Visit: Payer: Self-pay

## 2020-12-21 DIAGNOSIS — E538 Deficiency of other specified B group vitamins: Secondary | ICD-10-CM

## 2020-12-21 MED ORDER — CYANOCOBALAMIN 1000 MCG/ML IJ SOLN
1000.0000 ug | Freq: Once | INTRAMUSCULAR | Status: AC
  Administered 2020-12-21: 1000 ug via INTRAMUSCULAR

## 2020-12-21 NOTE — Progress Notes (Signed)
Pt here for monthly B12 injection per Dr. Jenny Reichmann  B12 1078mg given IM, and pt tolerated injection well.  Next B12 injection scheduled for 01/21/21

## 2021-01-01 ENCOUNTER — Telehealth: Payer: Self-pay | Admitting: Internal Medicine

## 2021-01-01 NOTE — Telephone Encounter (Signed)
No medication remaining  Patient requesting refill today for allopurinol (ZYLOPRIM) 300 MG tablet   Pharmacy [id number] - [place], [place]

## 2021-01-04 MED ORDER — ALLOPURINOL 300 MG PO TABS: 300.0000 mg | ORAL_TABLET | Freq: Every day | ORAL | 3 refills | Status: DC

## 2021-01-11 ENCOUNTER — Other Ambulatory Visit: Payer: Self-pay | Admitting: Hematology and Oncology

## 2021-01-11 DIAGNOSIS — D472 Monoclonal gammopathy: Secondary | ICD-10-CM

## 2021-01-11 DIAGNOSIS — E538 Deficiency of other specified B group vitamins: Secondary | ICD-10-CM

## 2021-01-12 ENCOUNTER — Other Ambulatory Visit: Payer: Self-pay

## 2021-01-12 ENCOUNTER — Inpatient Hospital Stay: Payer: Medicare HMO | Attending: Hematology and Oncology

## 2021-01-12 DIAGNOSIS — D472 Monoclonal gammopathy: Secondary | ICD-10-CM | POA: Insufficient documentation

## 2021-01-12 DIAGNOSIS — Z79899 Other long term (current) drug therapy: Secondary | ICD-10-CM | POA: Insufficient documentation

## 2021-01-12 DIAGNOSIS — G629 Polyneuropathy, unspecified: Secondary | ICD-10-CM | POA: Diagnosis not present

## 2021-01-12 DIAGNOSIS — Z7982 Long term (current) use of aspirin: Secondary | ICD-10-CM | POA: Diagnosis not present

## 2021-01-12 DIAGNOSIS — E538 Deficiency of other specified B group vitamins: Secondary | ICD-10-CM | POA: Insufficient documentation

## 2021-01-12 LAB — COMPREHENSIVE METABOLIC PANEL
ALT: 35 U/L (ref 0–44)
AST: 32 U/L (ref 15–41)
Albumin: 4.4 g/dL (ref 3.5–5.0)
Alkaline Phosphatase: 64 U/L (ref 38–126)
Anion gap: 10 (ref 5–15)
BUN: 14 mg/dL (ref 8–23)
CO2: 27 mmol/L (ref 22–32)
Calcium: 9.7 mg/dL (ref 8.9–10.3)
Chloride: 105 mmol/L (ref 98–111)
Creatinine, Ser: 1.08 mg/dL (ref 0.61–1.24)
GFR, Estimated: >60 mL/min (ref >60)
Glucose, Bld: 115 mg/dL — ABNORMAL HIGH (ref 70–99)
Potassium: 4 mmol/L (ref 3.5–5.1)
Sodium: 142 mmol/L (ref 135–145)
Total Bilirubin: 0.9 mg/dL (ref 0.3–1.2)
Total Protein: 8.1 g/dL (ref 6.5–8.1)

## 2021-01-12 LAB — CBC WITH DIFFERENTIAL/PLATELET
Abs Immature Granulocytes: 0.01 10*3/uL (ref 0.00–0.07)
Basophils Absolute: 0 10*3/uL (ref 0.0–0.1)
Basophils Relative: 0 %
Eosinophils Absolute: 0.1 10*3/uL (ref 0.0–0.5)
Eosinophils Relative: 1 %
HCT: 39.2 % (ref 39.0–52.0)
Hemoglobin: 13.6 g/dL (ref 13.0–17.0)
Immature Granulocytes: 0 %
Lymphocytes Relative: 34 %
Lymphs Abs: 1.9 10*3/uL (ref 0.7–4.0)
MCH: 34.7 pg — ABNORMAL HIGH (ref 26.0–34.0)
MCHC: 34.7 g/dL (ref 30.0–36.0)
MCV: 100 fL (ref 80.0–100.0)
Monocytes Absolute: 0.5 10*3/uL (ref 0.1–1.0)
Monocytes Relative: 8 %
Neutro Abs: 3.2 10*3/uL (ref 1.7–7.7)
Neutrophils Relative %: 57 %
Platelets: 269 10*3/uL (ref 150–400)
RBC: 3.92 MIL/uL — ABNORMAL LOW (ref 4.22–5.81)
RDW: 12.4 % (ref 11.5–15.5)
WBC: 5.7 10*3/uL (ref 4.0–10.5)
nRBC: 0 % (ref 0.0–0.2)

## 2021-01-12 LAB — VITAMIN B12: Vitamin B-12: 355 pg/mL (ref 180–914)

## 2021-01-13 LAB — KAPPA/LAMBDA LIGHT CHAINS
Kappa free light chain: 47.8 mg/L — ABNORMAL HIGH (ref 3.3–19.4)
Kappa, lambda light chain ratio: 3.62 — ABNORMAL HIGH (ref 0.26–1.65)
Lambda free light chains: 13.2 mg/L (ref 5.7–26.3)

## 2021-01-14 LAB — MULTIPLE MYELOMA PANEL, SERUM
Albumin SerPl Elph-Mcnc: 4 g/dL (ref 2.9–4.4)
Albumin/Glob SerPl: 1.2 (ref 0.7–1.7)
Alpha 1: 0.3 g/dL (ref 0.0–0.4)
Alpha2 Glob SerPl Elph-Mcnc: 0.6 g/dL (ref 0.4–1.0)
B-Globulin SerPl Elph-Mcnc: 1.2 g/dL (ref 0.7–1.3)
Gamma Glob SerPl Elph-Mcnc: 1.3 g/dL (ref 0.4–1.8)
Globulin, Total: 3.4 g/dL (ref 2.2–3.9)
IgA: 345 mg/dL (ref 61–437)
IgG (Immunoglobin G), Serum: 1418 mg/dL (ref 603–1613)
IgM (Immunoglobulin M), Srm: 32 mg/dL (ref 15–143)
M Protein SerPl Elph-Mcnc: 0.7 g/dL — ABNORMAL HIGH
Total Protein ELP: 7.4 g/dL (ref 6.0–8.5)

## 2021-01-19 ENCOUNTER — Inpatient Hospital Stay: Payer: Medicare HMO | Admitting: Hematology and Oncology

## 2021-01-19 ENCOUNTER — Encounter: Payer: Self-pay | Admitting: Hematology and Oncology

## 2021-01-19 ENCOUNTER — Other Ambulatory Visit: Payer: Self-pay

## 2021-01-19 DIAGNOSIS — D472 Monoclonal gammopathy: Secondary | ICD-10-CM | POA: Diagnosis not present

## 2021-01-19 DIAGNOSIS — G629 Polyneuropathy, unspecified: Secondary | ICD-10-CM | POA: Diagnosis not present

## 2021-01-19 DIAGNOSIS — Z79899 Other long term (current) drug therapy: Secondary | ICD-10-CM | POA: Diagnosis not present

## 2021-01-19 DIAGNOSIS — Z7982 Long term (current) use of aspirin: Secondary | ICD-10-CM | POA: Diagnosis not present

## 2021-01-19 DIAGNOSIS — E538 Deficiency of other specified B group vitamins: Secondary | ICD-10-CM | POA: Diagnosis not present

## 2021-01-19 NOTE — Progress Notes (Signed)
[facility] OFFICE PROGRESS NOTE  Patient Care Team: Biagio Borg, MD as PCP - General Clearnce Sorrel, MD as Attending Physician (Neurology) Earnie Larsson, MD (Neurosurgery) Heath Lark, MD as Consulting Physician (Hematology and Oncology)  ASSESSMENT & PLAN:  MGUS (monoclonal gammopathy of unknown significance) Clinically, he has no signs of disease progression. Plan to see him next year in with repeat blood work, examination and blood work. His peripheral neuropathy is also not related to that  No orders of the defined types were placed in this encounter.   All questions were answered. The patient knows to call the clinic with any problems, questions or concerns. The total time spent in the appointment was 15 minutes encounter with patients including review of chart and various tests results, discussions about plan of care and coordination of care plan   Heath Lark, MD 01/19/2021 10:48 AM  INTERVAL HISTORY: Please see below for problem oriented charting. he returns for follow-up on IgG kappa MGUS He is doing well Denies new bone pain no recent infection  REVIEW OF SYSTEMS:   Constitutional: Denies fevers, chills or abnormal weight loss Eyes: Denies blurriness of vision Ears, nose, mouth, throat, and face: Denies mucositis or sore throat Respiratory: Denies cough, dyspnea or wheezes Cardiovascular: Denies palpitation, chest discomfort or lower extremity swelling Gastrointestinal:  Denies nausea, heartburn or change in bowel habits Skin: Denies abnormal skin rashes Lymphatics: Denies new lymphadenopathy or easy bruising Neurological:Denies numbness, tingling or new weaknesses Behavioral/Psych: Mood is stable, no new changes  All other systems were reviewed with the patient and are negative.  I have reviewed the past medical history, past surgical history, social history and family history with the patient and they are unchanged from previous  note.  ALLERGIES:  is allergic to no known allergies and nsaids.  MEDICATIONS:  Current Outpatient Medications  Medication Sig Dispense Refill   allopurinol (ZYLOPRIM) 300 MG tablet Take 1 tablet (300 mg total) by mouth daily. 90 tablet 3   amLODipine (NORVASC) 5 MG tablet TAKE 1 TABLET BY MOUTH EVERY DAY 90 tablet 1   Ascorbic Acid (VITAMIN C WITH ROSE HIPS) 1000 MG tablet Take 1 tablet (1,000 mg total) by mouth daily. (Patient not taking: Reported on 11/19/2020) 90 tablet 0   atorvastatin (LIPITOR) 40 MG tablet TAKE 1 TABLET (40 MG TOTAL) BY MOUTH DAILY. 90 tablet 3   Cholecalciferol (VITAMIN D-3) 1000 units CAPS Take 2 capsules (2,000 Units total) by mouth daily. 180 capsule 0   colchicine 0.6 MG tablet Take 1 tablet (0.6 mg total) by mouth 2 (two) times daily. For 5 days when having flare. 180 tablet 1   Cyanocobalamin (B-12 COMPLIANCE INJECTION IJ) Inject 1 Dose as directed every 30 (thirty) days.     gabapentin (NEURONTIN) 300 MG capsule TAKE 2 CAPSULES BY MOUTH 3 TIMES A DAY 90 capsule 5   pantoprazole (PROTONIX) 40 MG tablet Take 1 tablet (40 mg total) by mouth daily. 90 tablet 2   PFIZER-BIONT COVID-19 VAC-TRIS SUSP injection      PRESCRIPTION MEDICATION 1 Syringe by Intracavernosal route every other day as needed (10-30 minutes before you have sex for ED). Matrix (Papaverine 30mg  Phentolamine 1mg  Alprostadil 98mcg per ml injection)  erectile dysfunction injection every other day as needed for sex     tiZANidine (ZANAFLEX) 4 MG tablet TAKE 1 TABLET BY MOUTH 3  TIMES DAILY AS NEEDED FOR  MUSCLE SPASM(S) 270 tablet 1   traMADol (ULTRAM) 50 MG tablet Take 1  tablet (50 mg total) by mouth every 6 (six) hours as needed. 30 tablet 0   Current Facility-Administered Medications  Medication Dose Route Frequency Provider Last Rate Last Admin   cyanocobalamin ((VITAMIN B-12)) injection 1,000 mcg  1,000 mcg Intramuscular Q30 days Biagio Borg, MD   1,000 mcg at 10/15/20 1779    SUMMARY OF  ONCOLOGIC HISTORY: Oncology History   No history exists.    PHYSICAL EXAMINATION: ECOG PERFORMANCE STATUS: 0 - Asymptomatic  Vitals:   01/19/21 0937  BP: (!) 170/93  Pulse: 60  Resp: 18  Temp: 97.8 F (36.6 C)  SpO2: 100%   Filed Weights   01/19/21 0937  Weight: 159 lb 3.2 oz (72.2 kg)    GENERAL:alert, no distress and comfortable NEURO: alert & oriented x 3 with fluent speech, no focal motor/sensory deficits  LABORATORY DATA:  I have reviewed the data as listed    Component Value Date/Time   NA 142 01/12/2021 0922   NA 141 12/29/2016 1152   K 4.0 01/12/2021 0922   K 4.1 12/29/2016 1152   CL 105 01/12/2021 0922   CL 104 06/28/2012 1010   CO2 27 01/12/2021 0922   CO2 29 12/29/2016 1152   GLUCOSE 115 (H) 01/12/2021 0922   GLUCOSE 191 (H) 12/29/2016 1152   GLUCOSE 127 (H) 06/28/2012 1010   BUN 14 01/12/2021 0922   BUN 15.1 12/29/2016 1152   CREATININE 1.08 01/12/2021 0922   CREATININE 1.1 12/29/2016 1152   CALCIUM 9.7 01/12/2021 0922   CALCIUM 9.7 12/29/2016 1152   PROT 8.1 01/12/2021 0922   PROT 7.4 12/29/2016 1152   PROT 6.8 12/29/2016 1152   ALBUMIN 4.4 01/12/2021 0922   ALBUMIN 3.9 12/29/2016 1152   AST 32 01/12/2021 0922   AST 20 12/29/2016 1152   ALT 35 01/12/2021 0922   ALT 23 12/29/2016 1152   ALKPHOS 64 01/12/2021 0922   ALKPHOS 78 12/29/2016 1152   BILITOT 0.9 01/12/2021 0922   BILITOT 1.19 12/29/2016 1152   GFRNONAA >60 01/12/2021 0922   GFRAA >60 01/14/2019 1100    No results found for: SPEP, UPEP  Lab Results  Component Value Date   WBC 5.7 01/12/2021   NEUTROABS 3.2 01/12/2021   HGB 13.6 01/12/2021   HCT 39.2 01/12/2021   MCV 100.0 01/12/2021   PLT 269 01/12/2021      Chemistry      Component Value Date/Time   NA 142 01/12/2021 0922   NA 141 12/29/2016 1152   K 4.0 01/12/2021 0922   K 4.1 12/29/2016 1152   CL 105 01/12/2021 0922   CL 104 06/28/2012 1010   CO2 27 01/12/2021 0922   CO2 29 12/29/2016 1152   BUN 14  01/12/2021 0922   BUN 15.1 12/29/2016 1152   CREATININE 1.08 01/12/2021 0922   CREATININE 1.1 12/29/2016 1152      Component Value Date/Time   CALCIUM 9.7 01/12/2021 0922   CALCIUM 9.7 12/29/2016 1152   ALKPHOS 64 01/12/2021 0922   ALKPHOS 78 12/29/2016 1152   AST 32 01/12/2021 0922   AST 20 12/29/2016 1152   ALT 35 01/12/2021 0922   ALT 23 12/29/2016 1152   BILITOT 0.9 01/12/2021 0922   BILITOT 1.19 12/29/2016 1152

## 2021-01-19 NOTE — Assessment & Plan Note (Signed)
Clinically, he has no signs of disease progression. Plan to see him next year in with repeat blood work, examination and blood work. His peripheral neuropathy is also not related to that

## 2021-01-21 ENCOUNTER — Other Ambulatory Visit: Payer: Self-pay

## 2021-01-21 ENCOUNTER — Ambulatory Visit (INDEPENDENT_AMBULATORY_CARE_PROVIDER_SITE_OTHER): Payer: Medicare HMO

## 2021-01-21 DIAGNOSIS — Z23 Encounter for immunization: Secondary | ICD-10-CM | POA: Diagnosis not present

## 2021-01-21 DIAGNOSIS — E538 Deficiency of other specified B group vitamins: Secondary | ICD-10-CM

## 2021-01-21 MED ORDER — CYANOCOBALAMIN 1000 MCG/ML IJ SOLN
1000.0000 ug | Freq: Once | INTRAMUSCULAR | Status: AC
  Administered 2021-01-21: 1000 ug via INTRAMUSCULAR

## 2021-01-21 NOTE — Progress Notes (Addendum)
B12 given w/o any complications.  HD flu vacc given w/o complications.

## 2021-01-21 NOTE — Addendum Note (Signed)
Addended by: Marijean Heath R on: 01/21/2021 10:39 AM   Modules accepted: Orders

## 2021-02-25 ENCOUNTER — Ambulatory Visit: Payer: Medicare HMO

## 2021-02-26 ENCOUNTER — Other Ambulatory Visit: Payer: Self-pay

## 2021-02-26 ENCOUNTER — Ambulatory Visit (INDEPENDENT_AMBULATORY_CARE_PROVIDER_SITE_OTHER): Payer: Medicare HMO

## 2021-02-26 DIAGNOSIS — E538 Deficiency of other specified B group vitamins: Secondary | ICD-10-CM | POA: Diagnosis not present

## 2021-02-26 MED ORDER — CYANOCOBALAMIN 1000 MCG/ML IJ SOLN
1000.0000 ug | Freq: Once | INTRAMUSCULAR | Status: AC
  Administered 2021-02-26: 1000 ug via INTRAMUSCULAR

## 2021-02-26 NOTE — Progress Notes (Signed)
Pt given B12 w/o any complications. °

## 2021-03-31 ENCOUNTER — Ambulatory Visit (INDEPENDENT_AMBULATORY_CARE_PROVIDER_SITE_OTHER): Payer: Medicare HMO

## 2021-03-31 ENCOUNTER — Other Ambulatory Visit: Payer: Self-pay

## 2021-03-31 DIAGNOSIS — E538 Deficiency of other specified B group vitamins: Secondary | ICD-10-CM

## 2021-03-31 MED ORDER — CYANOCOBALAMIN 1000 MCG/ML IJ SOLN
1000.0000 ug | Freq: Once | INTRAMUSCULAR | Status: AC
  Administered 2021-03-31: 11:00:00 1000 ug via INTRAMUSCULAR

## 2021-03-31 NOTE — Progress Notes (Signed)
Pt was given B12 w/o any complications. 

## 2021-05-03 ENCOUNTER — Ambulatory Visit (INDEPENDENT_AMBULATORY_CARE_PROVIDER_SITE_OTHER): Payer: Medicare HMO

## 2021-05-03 ENCOUNTER — Other Ambulatory Visit: Payer: Self-pay

## 2021-05-03 DIAGNOSIS — E538 Deficiency of other specified B group vitamins: Secondary | ICD-10-CM

## 2021-05-03 NOTE — Progress Notes (Signed)
B12 injection given and tolerated well.  

## 2021-05-05 ENCOUNTER — Ambulatory Visit: Payer: Medicare HMO

## 2021-05-19 ENCOUNTER — Other Ambulatory Visit: Payer: Self-pay

## 2021-05-19 ENCOUNTER — Other Ambulatory Visit (INDEPENDENT_AMBULATORY_CARE_PROVIDER_SITE_OTHER): Payer: Medicare HMO

## 2021-05-19 DIAGNOSIS — R7302 Impaired glucose tolerance (oral): Secondary | ICD-10-CM

## 2021-05-19 DIAGNOSIS — E538 Deficiency of other specified B group vitamins: Secondary | ICD-10-CM

## 2021-05-19 DIAGNOSIS — E559 Vitamin D deficiency, unspecified: Secondary | ICD-10-CM | POA: Diagnosis not present

## 2021-05-19 DIAGNOSIS — I10 Essential (primary) hypertension: Secondary | ICD-10-CM

## 2021-05-19 DIAGNOSIS — Z0000 Encounter for general adult medical examination without abnormal findings: Secondary | ICD-10-CM | POA: Diagnosis not present

## 2021-05-19 LAB — BASIC METABOLIC PANEL
BUN: 15 mg/dL (ref 6–23)
CO2: 28 mEq/L (ref 19–32)
Calcium: 9.5 mg/dL (ref 8.4–10.5)
Chloride: 104 mEq/L (ref 96–112)
Creatinine, Ser: 1.17 mg/dL (ref 0.40–1.50)
GFR: 62.75 mL/min (ref >60.00)
Glucose, Bld: 110 mg/dL — ABNORMAL HIGH (ref 70–99)
Potassium: 4 mEq/L (ref 3.5–5.1)
Sodium: 143 mEq/L (ref 135–145)

## 2021-05-19 LAB — HEMOGLOBIN A1C: Hgb A1c MFr Bld: 6.3 % (ref 4.6–6.5)

## 2021-05-19 LAB — URINALYSIS, ROUTINE W REFLEX MICROSCOPIC
Bilirubin Urine: NEGATIVE
Hgb urine dipstick: NEGATIVE
Ketones, ur: NEGATIVE
Leukocytes,Ua: NEGATIVE
Nitrite: NEGATIVE
RBC / HPF: NONE SEEN (ref 0–?)
Specific Gravity, Urine: 1.025 (ref 1.000–1.030)
Urine Glucose: NEGATIVE
Urobilinogen, UA: 0.2 (ref 0.0–1.0)
pH: 6 (ref 5.0–8.0)

## 2021-05-19 LAB — CBC WITH DIFFERENTIAL/PLATELET
Basophils Absolute: 0 10*3/uL (ref 0.0–0.1)
Basophils Relative: 0.3 % (ref 0.0–3.0)
Eosinophils Absolute: 0.2 10*3/uL (ref 0.0–0.7)
Eosinophils Relative: 2.4 % (ref 0.0–5.0)
HCT: 41.3 % (ref 39.0–52.0)
Hemoglobin: 14 g/dL (ref 13.0–17.0)
Lymphocytes Relative: 29.6 % (ref 12.0–46.0)
Lymphs Abs: 2.1 10*3/uL (ref 0.7–4.0)
MCHC: 34 g/dL (ref 30.0–36.0)
MCV: 99.7 fl (ref 78.0–100.0)
Monocytes Absolute: 0.7 10*3/uL (ref 0.1–1.0)
Monocytes Relative: 10.3 % (ref 3.0–12.0)
Neutro Abs: 4 10*3/uL (ref 1.4–7.7)
Neutrophils Relative %: 57.4 % (ref 43.0–77.0)
Platelets: 261 10*3/uL (ref 150.0–400.0)
RBC: 4.14 Mil/uL — ABNORMAL LOW (ref 4.22–5.81)
RDW: 12.9 % (ref 11.5–15.5)
WBC: 6.9 10*3/uL (ref 4.0–10.5)

## 2021-05-19 LAB — TSH: TSH: 2.49 u[IU]/mL (ref 0.35–5.50)

## 2021-05-19 LAB — LIPID PANEL
Cholesterol: 157 mg/dL (ref 0–200)
HDL: 66.5 mg/dL (ref >39.00)
LDL Cholesterol: 71 mg/dL (ref 0–99)
NonHDL: 90.88
Total CHOL/HDL Ratio: 2
Triglycerides: 97 mg/dL (ref 0.0–149.0)
VLDL: 19.4 mg/dL (ref 0.0–40.0)

## 2021-05-19 LAB — HEPATIC FUNCTION PANEL
ALT: 45 U/L (ref 0–53)
AST: 41 U/L — ABNORMAL HIGH (ref 0–37)
Albumin: 4.4 g/dL (ref 3.5–5.2)
Alkaline Phosphatase: 61 U/L (ref 39–117)
Bilirubin, Direct: 0.2 mg/dL (ref 0.0–0.3)
Total Bilirubin: 1.1 mg/dL (ref 0.2–1.2)
Total Protein: 7.5 g/dL (ref 6.0–8.3)

## 2021-05-19 LAB — VITAMIN D 25 HYDROXY (VIT D DEFICIENCY, FRACTURES): VITD: 24.47 ng/mL — ABNORMAL LOW (ref 30.00–100.00)

## 2021-05-19 LAB — VITAMIN B12: Vitamin B-12: 282 pg/mL (ref 211–911)

## 2021-05-19 LAB — PSA: PSA: 0.09 ng/mL — ABNORMAL LOW (ref 0.10–4.00)

## 2021-05-26 ENCOUNTER — Ambulatory Visit: Payer: Medicare HMO | Admitting: Internal Medicine

## 2021-05-27 ENCOUNTER — Encounter: Payer: Self-pay | Admitting: Internal Medicine

## 2021-05-27 ENCOUNTER — Other Ambulatory Visit: Payer: Self-pay

## 2021-05-27 ENCOUNTER — Ambulatory Visit (INDEPENDENT_AMBULATORY_CARE_PROVIDER_SITE_OTHER): Payer: Medicare HMO | Admitting: Internal Medicine

## 2021-05-27 VITALS — BP 128/76 | HR 65 | Temp 98.3°F | Ht 68.0 in | Wt 163.0 lb

## 2021-05-27 DIAGNOSIS — I10 Essential (primary) hypertension: Secondary | ICD-10-CM

## 2021-05-27 DIAGNOSIS — R7302 Impaired glucose tolerance (oral): Secondary | ICD-10-CM

## 2021-05-27 DIAGNOSIS — E7800 Pure hypercholesterolemia, unspecified: Secondary | ICD-10-CM

## 2021-05-27 DIAGNOSIS — E538 Deficiency of other specified B group vitamins: Secondary | ICD-10-CM | POA: Diagnosis not present

## 2021-05-27 DIAGNOSIS — Z0001 Encounter for general adult medical examination with abnormal findings: Secondary | ICD-10-CM | POA: Diagnosis not present

## 2021-05-27 DIAGNOSIS — E559 Vitamin D deficiency, unspecified: Secondary | ICD-10-CM | POA: Diagnosis not present

## 2021-05-27 MED ORDER — ALLOPURINOL 300 MG PO TABS: 300.0000 mg | ORAL_TABLET | Freq: Every day | ORAL | 3 refills | Status: DC

## 2021-05-27 MED ORDER — PANTOPRAZOLE SODIUM 40 MG PO TBEC: 40.0000 mg | DELAYED_RELEASE_TABLET | Freq: Every day | ORAL | 3 refills | Status: DC

## 2021-05-27 MED ORDER — GABAPENTIN 300 MG PO CAPS: ORAL_CAPSULE | ORAL | 5 refills | Status: DC

## 2021-05-27 MED ORDER — ATORVASTATIN CALCIUM 40 MG PO TABS: 40.0000 mg | ORAL_TABLET | Freq: Every day | ORAL | 3 refills | Status: DC

## 2021-05-27 MED ORDER — AMLODIPINE BESYLATE 5 MG PO TABS: 5.0000 mg | ORAL_TABLET | Freq: Every day | ORAL | 3 refills | Status: DC

## 2021-05-27 NOTE — Patient Instructions (Addendum)
Please take OTC Vitamin D3 at 2000 units per day, indefinitely  Please continue all other medications as before, and refills have been done if requested.  Please have the pharmacy call with any other refills you may need.  Please continue your efforts at being more active, low cholesterol diet, and weight control.  You are otherwise up to date with prevention measures today.  Please keep your appointments with your specialists as you may have planned  Please make an Appointment to return for your 1 year visit, or sooner if needed, with Lab testing by Appointment as well, to be done about 3-5 days before at the [facility] (so this is for TWO appointments - please see the scheduling desk as you leave)  Due to the ongoing Covid 19 pandemic, our lab now requires an appointment for any labs done at our office.  If you need labs done and do not have an appointment, please call our office ahead of time to schedule before presenting to the lab for your testing.

## 2021-05-27 NOTE — Assessment & Plan Note (Signed)
Last vitamin D Lab Results  Component Value Date   VD25OH 24.47 (L) 05/19/2021   Low, to start oral replacement

## 2021-05-27 NOTE — Progress Notes (Signed)
Patient ID: Nathan Wallace, male   DOB: Aug 15, 1950, 71 y.o.   MRN: [id number]         Chief Complaint:: wellness exam and hyperglycemia, hld, low vit d and b12       HPI:  Shady Bradish is a 71 y.o. male here for wellness exam; declines shingrix, o/w up to date                        Also taking B12 injecitons.  Not taking Vit d.  Pt denies chest pain, increased sob or doe, wheezing, orthopnea, PND, increased LE swelling, palpitations, dizziness or syncope.   Pt denies polydipsia, polyuria, or new focal neuro s/s.   Pt denies fever, wt loss, night sweats, loss of appetite, or other constitutional symptoms  Trying to follow lower chol diet.  Wt Readings from Last 3 Encounters:  05/27/21 163 lb (73.9 kg)  01/19/21 159 lb 3.2 oz (72.2 kg)  11/19/20 158 lb (71.7 kg)   BP Readings from Last 3 Encounters:  05/27/21 128/76  01/19/21 (!) 170/93  11/19/20 130/82   Immunization History  Administered Date(s) Administered   Fluad Quad(high Dose 65+) 12/06/2018, 01/06/2020, 01/21/2021   Influenza Split 12/19/2011   Influenza, High Dose Seasonal PF 12/25/2015, 02/02/2017, 01/05/2018   Influenza,inj,Quad PF,6+ Mos 02/28/2013, 04/09/2014, 01/05/2015   PFIZER Comirnaty(Gray Top)Covid-19 Tri-Sucrose Vaccine 07/30/2020   PFIZER(Purple Top)SARS-COV-2 Vaccination 05/24/2019, 06/18/2019, 01/21/2020   Pfizer Covid-19 Vaccine Bivalent Booster 53yrs & up 03/03/2021   Pneumococcal Conjugate-13 05/18/2017   Pneumococcal Polysaccharide-23 05/22/2018   Td 08/26/2008   Tdap 02/23/2012   Zoster, Live 07/26/2012   There are no preventive care reminders to display for this patient.     Past Medical History:  Diagnosis Date   AKI (acute kidney injury) ([place])    Arthritis    RT KNEE AND RT SHOULDER   B12 deficiency    Benign head tremor    Chest tightness    comes and goes    CVA (cerebrovascular accident due to intracerebral hemorrhage) ([place]) 08/03/2011   Degenerative joint disease of knee, right     Depression 07/26/2012   no problems now   Diverticulosis    GERD (gastroesophageal reflux disease)    NO RECENT PROBLEMS AND NO MEDS   History of blood transfusion    History of kidney stones    History of urinary tract infection    Hyperlipidemia    border line   Hypertension    Impaired glucose tolerance 06/22/2011   Left inguinal hernia    Macrocytic anemia    MGUS (monoclonal gammopathy of unknown significance)    Prostate cancer ([place]) 2011   Rotator cuff tear arthropathy of right shoulder, with instability 10/25/2016   Sepsis ([place])    Stress incontinence    Stroke [facility])    2013   Syncope 2012   after prostate surgery; neg neurology work up.    Past Surgical History:  Procedure Laterality Date   COLONOSCOPY     INGUINAL HERNIA REPAIR  06/01/2011   Procedure: HERNIA REPAIR INGUINAL ADULT;  Surgeon: Adin Hector, MD;  Location: [facility];  Service: General;  Laterality: Left;  Left Inguinal Hernia Repair with Mesh   ROBOT ASSISTED LAPAROSCOPIC RADICAL PROSTATECTOMY  jan 2012   da vinci   SPINE SURGERY  2003 OR 2004   CERVICAL FUSION-STATES LIMITED ROM NECK   TOTAL SHOULDER ARTHROPLASTY Right 10/25/2016   TOTAL SHOULDER ARTHROPLASTY Right 10/25/2016   Procedure:  RIGHT TOTAL SHOULDER ARTHROPLASTY;  Surgeon: Marchia Bond, MD;  Location: [place];  Service: Orthopedics;  Laterality: Right;   URETHRAL SLING N/A 03/08/2016   Procedure: MALE SLING CYSTOSCOPY;  Surgeon: Bjorn Loser, MD;  [facility];  Service: Urology;  Laterality: N/A;   WRIST SURGERY     right     reports that he quit smoking about 54 years ago. His smoking use included cigarettes. He has a 0.50 pack-year smoking history. He has never used smokeless tobacco. He reports current alcohol use. He reports that he does not currently use drugs after having used the following drugs: Marijuana. family history includes Aneurysm in his mother; Colon cancer (age of onset: 51) in his father; Heart attack in his brother;  Hypertension in his mother. Allergies  Allergen Reactions   No Known Allergies    Nsaids Other (See Comments)    Gastric ulcer   Current Outpatient Medications on File Prior to Visit  Medication Sig Dispense Refill   Ascorbic Acid (VITAMIN C WITH ROSE HIPS) 1000 MG tablet Take 1 tablet (1,000 mg total) by mouth daily. 90 tablet 0   Cholecalciferol (VITAMIN D-3) 1000 units CAPS Take 2 capsules (2,000 Units total) by mouth daily. 180 capsule 0   Cyanocobalamin (B-12 COMPLIANCE INJECTION IJ) Inject 1 Dose as directed every 30 (thirty) days.     PRESCRIPTION MEDICATION 1 Syringe by Intracavernosal route every other day as needed (10-30 minutes before you have sex for ED). Matrix (Papaverine 30mg  Phentolamine 1mg  Alprostadil 42mcg per ml injection)  erectile dysfunction injection every other day as needed for sex     tiZANidine (ZANAFLEX) 4 MG tablet TAKE 1 TABLET BY MOUTH 3  TIMES DAILY AS NEEDED FOR  MUSCLE SPASM(S) 270 tablet 1   traMADol (ULTRAM) 50 MG tablet Take 1 tablet (50 mg total) by mouth every 6 (six) hours as needed. 30 tablet 0   Current Facility-Administered Medications on File Prior to Visit  Medication Dose Route Frequency Provider Last Rate Last Admin   cyanocobalamin ((VITAMIN B-12)) injection 1,000 mcg  1,000 mcg Intramuscular Q30 days Biagio Borg, MD   1,000 mcg at 05/03/21 0939        ROS:  All others reviewed and negative.  Objective        PE:  BP 128/76 (BP Location: Left Arm, Patient Position: Sitting, Cuff Size: Large)    Pulse 65    Temp 98.3 F (36.8 C) (Oral)    Ht 5\' 8"  (1.727 m)    Wt 163 lb (73.9 kg)    SpO2 97%    BMI 24.78 kg/m                 Constitutional: Pt appears in NAD               HENT: Head: NCAT.                Right Ear: External ear normal.                 Left Ear: External ear normal.                Eyes: . Pupils are equal, round, and reactive to light. Conjunctivae and EOM are normal               Nose: without d/c or deformity                Neck: Neck supple. Gross normal ROM  Cardiovascular: Normal rate and regular rhythm.                 Pulmonary/Chest: Effort normal and breath sounds without rales or wheezing.                Abd:  Soft, NT, ND, + BS, no organomegaly               Neurological: Pt is alert. At baseline orientation, motor grossly intact               Skin: Skin is warm. No rashes, no other new lesions, LE edema - none               Psychiatric: Pt behavior is normal without agitation   Micro: none  Cardiac tracings I have personally interpreted today:  none  Pertinent Radiological findings (summarize): none   Lab Results  Component Value Date   WBC 6.9 05/19/2021   HGB 14.0 05/19/2021   HCT 41.3 05/19/2021   PLT 261.0 05/19/2021   GLUCOSE 110 (H) 05/19/2021   CHOL 157 05/19/2021   TRIG 97.0 05/19/2021   HDL 66.50 05/19/2021   LDLDIRECT 87.9 12/19/2011   LDLCALC 71 05/19/2021   ALT 45 05/19/2021   AST 41 (H) 05/19/2021   NA 143 05/19/2021   K 4.0 05/19/2021   CL 104 05/19/2021   CREATININE 1.17 05/19/2021   BUN 15 05/19/2021   CO2 28 05/19/2021   TSH 2.49 05/19/2021   PSA 0.09 (L) 05/19/2021   INR 1.12 03/02/2016   HGBA1C 6.3 05/19/2021   Assessment/Plan:  Cleburn Maiolo is a 71 y.o. Black or African American [2] male with  has a past medical history of AKI (acute kidney injury) ([place]), Arthritis, B12 deficiency, Benign head tremor, Chest tightness, CVA (cerebrovascular accident due to intracerebral hemorrhage) ([place]) (08/03/2011), Degenerative joint disease of knee, right, Depression (07/26/2012), Diverticulosis, GERD (gastroesophageal reflux disease), History of blood transfusion, History of kidney stones, History of urinary tract infection, Hyperlipidemia, Hypertension, Impaired glucose tolerance (06/22/2011), Left inguinal hernia, Macrocytic anemia, MGUS (monoclonal gammopathy of unknown significance), Prostate cancer ([place]) (2011), Rotator cuff tear arthropathy of right  shoulder, with instability (10/25/2016), Sepsis ([place]), Stress incontinence, Stroke ([place]), and Syncope (2012).  Vitamin D deficiency Last vitamin D Lab Results  Component Value Date   VD25OH 24.47 (L) 05/19/2021   Low, to start oral replacement   B12 deficiency Lab Results  Component Value Date   QZRAQTMA26 333 05/19/2021   Low, to restart oral replacement - b12 1000 mcg qd   Encounter for well adult exam with abnormal findings Age and sex appropriate education and counseling updated with regular exercise and diet Referrals for preventative services - none needed Immunizations addressed - declines shingrix Smoking counseling  - none needed Evidence for depression or other mood disorder - none significant Most recent labs reviewed. I have personally reviewed and have noted: 1) the patient's medical and social history 2) The patient's current medications and supplements 3) The patient's height, weight, and BMI have been recorded in the chart   HTN (hypertension) BP Readings from Last 3 Encounters:  05/27/21 128/76  01/19/21 (!) 170/93  11/19/20 130/82   Stable, pt to continue medical treatment norvasc 5   Hyperlipidemia Lab Results  Component Value Date   LDLCALC 71 05/19/2021   Uncontrolled, goal ldl < 70, pt to continue current statin liptior 40 as declines change   Impaired glucose tolerance Lab Results  Component Value Date   HGBA1C  6.3 05/19/2021   Stable, pt to continue current medical treatment  - diet  Followup: Return in about 1 year (around 05/27/2022).  Cathlean Cower, MD 05/30/2021 10:22 PM [facility]

## 2021-05-27 NOTE — Assessment & Plan Note (Signed)
Lab Results  Component Value Date   VITAMINB12 282 05/19/2021   Low, to restart oral replacement - b12 1000 mcg qd

## 2021-05-28 DIAGNOSIS — C61 Malignant neoplasm of prostate: Secondary | ICD-10-CM | POA: Diagnosis not present

## 2021-05-30 ENCOUNTER — Encounter: Payer: Self-pay | Admitting: Internal Medicine

## 2021-05-30 NOTE — Assessment & Plan Note (Signed)

## 2021-05-30 NOTE — Assessment & Plan Note (Signed)
Lab Results  Component Value Date   LDLCALC 71 05/19/2021   Uncontrolled, goal ldl < 70, pt to continue current statin liptior 40 as declines change

## 2021-05-30 NOTE — Assessment & Plan Note (Signed)
Lab Results  Component Value Date   HGBA1C 6.3 05/19/2021   Stable, pt to continue current medical treatment  - diet

## 2021-05-30 NOTE — Assessment & Plan Note (Signed)
BP Readings from Last 3 Encounters:  05/27/21 128/76  01/19/21 (!) 170/93  11/19/20 130/82   Stable, pt to continue medical treatment norvasc 5

## 2021-06-04 DIAGNOSIS — C61 Malignant neoplasm of prostate: Secondary | ICD-10-CM | POA: Diagnosis not present

## 2021-06-04 DIAGNOSIS — N5201 Erectile dysfunction due to arterial insufficiency: Secondary | ICD-10-CM | POA: Diagnosis not present

## 2021-06-04 DIAGNOSIS — N3946 Mixed incontinence: Secondary | ICD-10-CM | POA: Diagnosis not present

## 2021-06-07 ENCOUNTER — Telehealth: Payer: Self-pay | Admitting: *Deleted

## 2021-06-07 ENCOUNTER — Other Ambulatory Visit: Payer: Self-pay

## 2021-06-07 ENCOUNTER — Ambulatory Visit (INDEPENDENT_AMBULATORY_CARE_PROVIDER_SITE_OTHER): Payer: Medicare HMO | Admitting: *Deleted

## 2021-06-07 DIAGNOSIS — E538 Deficiency of other specified B group vitamins: Secondary | ICD-10-CM | POA: Diagnosis not present

## 2021-06-07 MED ORDER — CYANOCOBALAMIN 1000 MCG/ML IJ SOLN
1000.0000 ug | Freq: Once | INTRAMUSCULAR | Status: AC
  Administered 2021-06-07: 1000 ug via INTRAMUSCULAR

## 2021-06-07 NOTE — Progress Notes (Signed)
Patient here for b12 injection . Given in rt deltoid. Patient tolerated well .   Please cosign

## 2021-06-07 NOTE — Progress Notes (Deleted)
Patient here for b12 injection . Given in rt deltoid. Patient tolerated well

## 2021-06-07 NOTE — Telephone Encounter (Signed)
Patient was in office and dropped off a handicap placard form to be filled out by the provider. Formed filled out by [facility]

## 2021-07-05 ENCOUNTER — Other Ambulatory Visit: Payer: Self-pay

## 2021-07-05 ENCOUNTER — Ambulatory Visit (INDEPENDENT_AMBULATORY_CARE_PROVIDER_SITE_OTHER): Payer: Medicare HMO

## 2021-07-05 DIAGNOSIS — E538 Deficiency of other specified B group vitamins: Secondary | ICD-10-CM

## 2021-07-05 NOTE — Progress Notes (Signed)
Pt here for monthly B12 injection per Dr.John ? ?B12 107mg given IM, and pt tolerated injection well. ? ?Next B12 injection scheduled for 08/05/21 ?

## 2021-08-05 ENCOUNTER — Ambulatory Visit: Payer: Medicare HMO

## 2021-08-06 ENCOUNTER — Ambulatory Visit (INDEPENDENT_AMBULATORY_CARE_PROVIDER_SITE_OTHER): Payer: Medicare HMO | Admitting: *Deleted

## 2021-08-06 DIAGNOSIS — E538 Deficiency of other specified B group vitamins: Secondary | ICD-10-CM | POA: Diagnosis not present

## 2021-08-06 MED ORDER — CYANOCOBALAMIN 1000 MCG/ML IJ SOLN
1000.0000 ug | Freq: Once | INTRAMUSCULAR | Status: AC
  Administered 2021-08-06: 1000 ug via INTRAMUSCULAR

## 2021-08-06 NOTE — Progress Notes (Signed)
Patient is here for a B12 injection. Given in right deltoid. Patient tolerated well  ? ?Please co sign  ? ?

## 2021-09-08 ENCOUNTER — Ambulatory Visit (INDEPENDENT_AMBULATORY_CARE_PROVIDER_SITE_OTHER): Payer: Medicare HMO

## 2021-09-08 DIAGNOSIS — E538 Deficiency of other specified B group vitamins: Secondary | ICD-10-CM

## 2021-09-08 NOTE — Progress Notes (Signed)
B12 given and tolerated well °

## 2021-10-11 ENCOUNTER — Ambulatory Visit: Payer: Medicare HMO

## 2021-10-13 ENCOUNTER — Ambulatory Visit (INDEPENDENT_AMBULATORY_CARE_PROVIDER_SITE_OTHER): Payer: Medicare HMO

## 2021-10-13 DIAGNOSIS — E538 Deficiency of other specified B group vitamins: Secondary | ICD-10-CM | POA: Diagnosis not present

## 2021-10-13 NOTE — Progress Notes (Signed)
After obtaining consent, and per orders of Dr. Jenny Reichmann, injection of B12 given by Max Sane. Patient tolerated injection well and instructed to report any adverse reaction to me immediately.

## 2021-11-10 ENCOUNTER — Ambulatory Visit
Admission: EM | Admit: 2021-11-10 | Discharge: 2021-11-10 | Disposition: A | Discharge status: Home / Self Care | Payer: Medicare HMO | Attending: Internal Medicine | Admitting: Internal Medicine

## 2021-11-10 DIAGNOSIS — H66001 Acute suppurative otitis media without spontaneous rupture of ear drum, right ear: Secondary | ICD-10-CM | POA: Diagnosis not present

## 2021-11-10 MED ORDER — AMOXICILLIN-POT CLAVULANATE 875-125 MG PO TABS: 1.0000 | ORAL_TABLET | Freq: Two times a day (BID) | ORAL | 0 refills | Status: DC

## 2021-11-10 NOTE — Discharge Instructions (Signed)
It appears that you have an ear infection so this is being treated with an antibiotic.  I recommend that you see ear, nose, throat specialist with provided contact information for further evaluation and management as well.

## 2021-11-10 NOTE — ED Triage Notes (Signed)
Right side ear pain. Ringing,  Sharp pain under the ear  Dull pain, pain level 9/10  Onset: Sunday  [place]: homeopatic earache ear drops

## 2021-11-10 NOTE — ED Provider Notes (Signed)
[facility]    CSN: [id number] Arrival date & time: 11/10/21  1419      History   Chief Complaint Chief Complaint  Patient presents with   Otalgia    HPI Nathan Wallace is a 71 y.o. male.   Patient presents with right ear pain that started about 4 days ago.  Denies any associated upper respiratory symptoms or fever.  Denies trauma, foreign body, drainage, decreased hearing from the ear.  Patient has used over-the-counter eardrops with minimal improvement.   Otalgia   Past Medical History:  Diagnosis Date   AKI (acute kidney injury) ([place])    Arthritis    RT KNEE AND RT SHOULDER   B12 deficiency    Benign head tremor    Chest tightness    comes and goes    CVA (cerebrovascular accident due to intracerebral hemorrhage) ([place]) 08/03/2011   Degenerative joint disease of knee, right    Depression 07/26/2012   no problems now   Diverticulosis    GERD (gastroesophageal reflux disease)    NO RECENT PROBLEMS AND NO MEDS   History of blood transfusion    History of kidney stones    History of urinary tract infection    Hyperlipidemia    border line   Hypertension    Impaired glucose tolerance 06/22/2011   Left inguinal hernia    Macrocytic anemia    MGUS (monoclonal gammopathy of unknown significance)    Prostate cancer ([place]) 2011   Rotator cuff tear arthropathy of right shoulder, with instability 10/25/2016   Sepsis ([place])    Stress incontinence    Stroke [facility])    2013   Syncope 2012   after prostate surgery; neg neurology work up.     Patient Active Problem List   Diagnosis Date Noted   Vitamin D deficiency 11/19/2020   Iron deficiency 05/21/2020   Elevated serum creatinine 01/21/2020   Elevated liver enzymes 01/21/2020   Anxiety 05/28/2019   Black stool 04/23/2019   Family history of colon cancer 04/23/2019   Gastrointestinal hemorrhage 04/15/2019   Scapholunate dissociation of left wrist 05/16/2017   Wrist arthritis 05/16/2017   S/P shoulder  replacement    Rotator cuff tear arthropathy of right shoulder, with instability 10/25/2016   Rotator cuff arthropathy, right 10/25/2016   Bilateral shoulder pain 03/22/2016   Male stress incontinence 03/08/2016   Muscle cramp 01/06/2016   Hypotensive episode 01/06/2016   AKI (acute kidney injury) ([place]) 22/05/5425   Metabolic acidosis 10/02/7626   Lactic acidosis 01/06/2016   Macrocytic anemia with vitamin B12 deficiency 01/05/2016   Bilateral wrist pain 11/26/2015   Polyarthralgia 09/23/2015   Preventive measure 01/05/2015   History of prostate cancer 10/06/2013   Cervical disc disorder with radiculopathy of cervical region 07/16/2013   Left rotator cuff tear arthropathy 03/01/2013   Anemia, unspecified 02/21/2013   Acute gouty arthritis 11/09/2012   Depression 07/26/2012   Impingement syndrome of right shoulder 07/26/2012   MGUS (monoclonal gammopathy of unknown significance)    B12 deficiency 12/15/2011   Peripheral neuropathy 12/13/2011   Acute encephalopathy 08/03/2011   Degenerative joint disease of knee, right    Benign head tremor    Encounter for other preprocedural examination 06/23/2011   Right knee pain 06/23/2011   HTN (hypertension) 06/23/2011   Impaired glucose tolerance 06/22/2011   Encounter for well adult exam with abnormal findings 06/22/2011   Left inguinal hernia 06/01/2011   Left shoulder pain 12/29/2008   Hyperlipidemia 08/26/2008  ERECTILE DYSFUNCTION 08/26/2008   ALLERGIC RHINITIS 08/26/2008   GERD 08/26/2008   DIVERTICULOSIS, COLON 08/26/2008   LOW BACK PAIN 08/26/2008   RASH-NONVESICULAR 08/26/2008    Past Surgical History:  Procedure Laterality Date   COLONOSCOPY     INGUINAL HERNIA REPAIR  06/01/2011   Procedure: HERNIA REPAIR INGUINAL ADULT;  Surgeon: Adin Hector, MD;  [facility];  Service: General;  Laterality: Left;  Left Inguinal Hernia Repair with Mesh   ROBOT ASSISTED LAPAROSCOPIC RADICAL PROSTATECTOMY  jan 2012   da vinci    SPINE SURGERY  2003 OR 2004   CERVICAL FUSION-STATES LIMITED ROM NECK   TOTAL SHOULDER ARTHROPLASTY Right 10/25/2016   TOTAL SHOULDER ARTHROPLASTY Right 10/25/2016   Procedure: RIGHT TOTAL SHOULDER ARTHROPLASTY;  Surgeon: Marchia Bond, MD;  Location: [place];  Service: Orthopedics;  Laterality: Right;   URETHRAL SLING N/A 03/08/2016   Procedure: MALE SLING CYSTOSCOPY;  Surgeon: Bjorn Loser, MD;  [facility];  Service: Urology;  Laterality: N/A;   WRIST SURGERY     right        Home Medications    Prior to Admission medications   Medication Sig Start Date End Date Taking? Authorizing Provider  amoxicillin-clavulanate (AUGMENTIN) 875-125 MG tablet Take 1 tablet by mouth every 12 (twelve) hours. 11/10/21  Yes Jaryn Hocutt, Hildred Alamin E, FNP  allopurinol (ZYLOPRIM) 300 MG tablet Take 1 tablet (300 mg total) by mouth daily. 05/27/21   Biagio Borg, MD  amLODipine (NORVASC) 5 MG tablet Take 1 tablet (5 mg total) by mouth daily. 05/27/21   Biagio Borg, MD  Ascorbic Acid (VITAMIN C WITH ROSE HIPS) 1000 MG tablet Take 1 tablet (1,000 mg total) by mouth daily. 05/04/17   Biagio Borg, MD  atorvastatin (LIPITOR) 40 MG tablet Take 1 tablet (40 mg total) by mouth daily. 05/27/21   Biagio Borg, MD  Cholecalciferol (VITAMIN D-3) 1000 units CAPS Take 2 capsules (2,000 Units total) by mouth daily. 05/04/17   Biagio Borg, MD  Cyanocobalamin (B-12 COMPLIANCE INJECTION IJ) Inject 1 Dose as directed every 30 (thirty) days.    [provider]  gabapentin (NEURONTIN) 300 MG capsule TAKE 2 CAPSULES BY MOUTH 3 TIMES A DAY 05/27/21   Biagio Borg, MD  pantoprazole (PROTONIX) 40 MG tablet Take 1 tablet (40 mg total) by mouth daily. 05/27/21   Biagio Borg, MD  PRESCRIPTION MEDICATION 1 Syringe by Intracavernosal route every other day as needed (10-30 minutes before you have sex for ED). Matrix (Papaverine '30mg'$  Phentolamine '1mg'$  Alprostadil 58mg per ml injection)  erectile dysfunction injection every other  day as needed for sex    [provider]  tiZANidine (ZANAFLEX) 4 MG tablet TAKE 1 TABLET BY MOUTH 3  TIMES DAILY AS NEEDED FOR  MUSCLE SPASM(S) 01/02/17   JBiagio Borg MD  traMADol (ULTRAM) 50 MG tablet Take 1 tablet (50 mg total) by mouth every 6 (six) hours as needed. 05/28/19   JBiagio Borg MD    Family History Family History  Problem Relation Age of Onset   Hypertension Mother    Aneurysm Mother        brain   Colon cancer Father 75  Heart attack Brother     Social History Social History   Tobacco Use   Smoking status: Former    Packs/day: 0.50    Years: 1.00    Total pack years: 0.50    Types: Cigarettes    Quit date:  06/28/1966    Years since quitting: 55.4   Smokeless tobacco: Never  Vaping Use   Vaping Use: Never used  Substance Use Topics   Alcohol use: Yes    Comment: 3 beers daily; liquor 2 glasses every other day    Drug use: Not Currently    Types: Marijuana    Comment: last time 1 week ago at a party     Allergies   No known allergies and Nsaids   Review of Systems Review of Systems Per HPI  Physical Exam Triage Vital Signs ED Triage Vitals  Enc Vitals Group     BP 11/10/21 1515 (!) 169/99     Pulse Rate 11/10/21 1515 65     Resp --      Temp 11/10/21 1515 98.3 F (36.8 C)     Temp Source 11/10/21 1515 Oral     SpO2 11/10/21 1515 96 %     Weight 11/10/21 1521 175 lb (79.4 kg)     Height 11/10/21 1521 '5\' 11"'$  (1.803 m)     Head Circumference --      Peak Flow --      Pain Score 11/10/21 1519 9     Pain Loc --      Pain Edu? --      Excl. in [place]? --    No data found.  Updated Vital Signs BP (!) 169/99 (BP Location: Left Arm)   Pulse 65   Temp 98.3 F (36.8 C) (Oral)   Ht '5\' 11"'$  (1.803 m)   Wt 175 lb (79.4 kg)   SpO2 96%   BMI 24.41 kg/m   Visual Acuity Right Eye Distance:   Left Eye Distance:   Bilateral Distance:    Right Eye Near:   Left Eye Near:    Bilateral Near:     Physical Exam Constitutional:       General: He is not in acute distress.    Appearance: Normal appearance. He is not toxic-appearing or diaphoretic.  HENT:     Head: Normocephalic and atraumatic.     Right Ear: Ear canal and external ear normal. No drainage, swelling or tenderness. A middle ear effusion is present. There is no impacted cerumen. No foreign body. No mastoid tenderness. Tympanic membrane is erythematous. Tympanic membrane is not perforated or bulging.     Left Ear: Tympanic membrane, ear canal and external ear normal.     Ears:     Comments: Mild erythema and yellow discoloration to TM.  There is "bubbly" appearance to TM as well.  It does appear intact.  No drainage noted.  External canal appears normal. Eyes:     Extraocular Movements: Extraocular movements intact.     Conjunctiva/sclera: Conjunctivae normal.  Pulmonary:     Effort: Pulmonary effort is normal.  Neurological:     General: No focal deficit present.     Mental Status: He is alert and oriented to person, place, and time. Mental status is at baseline.  Psychiatric:        Mood and Affect: Mood normal.        Behavior: Behavior normal.        Thought Content: Thought content normal.        Judgment: Judgment normal.      UC Treatments / Results  Labs (all labs ordered are listed, but only abnormal results are displayed) Labs Reviewed - No data to display  EKG   Radiology No results found.  Procedures Procedures (including  critical care time)  Medications Ordered in UC Medications - No data to display  Initial Impression / Assessment and Plan / UC Course  I have reviewed the triage vital signs and the nursing notes.  Pertinent labs & imaging results that were available during my care of the patient were reviewed by me and considered in my medical decision making (see chart for details).     Patient's TM appears intact with yellow discoloration and mild erythema which indicates infection.  Although, there is a bubbly appearance  to TM which is not definitive for middle ear effusion or any definitive abnormality.  Will treat with Augmentin for infection.  Given abnormal appearance of TM on exam, encouraged patient to follow-up with ENT specialty at provided contact information for further evaluation and management.  Patient verbalized understanding and was agreeable with plan. Final Clinical Impressions(s) / UC Diagnoses   Final diagnoses:  Non-recurrent acute suppurative otitis media of right ear without spontaneous rupture of tympanic membrane     Discharge Instructions      It appears that you have an ear infection so this is being treated with an antibiotic.  I recommend that you see ear, nose, throat specialist with provided contact information for further evaluation and management as well.      ED Prescriptions     Medication Sig Dispense Auth. Provider   amoxicillin-clavulanate (AUGMENTIN) 875-125 MG tablet Take 1 tablet by mouth every 12 (twelve) hours. 14 tablet Ramsey, Michele Rockers, [place]      PDMP not reviewed this encounter.   Teodora Medici, [place] 11/10/21 1539

## 2021-11-12 ENCOUNTER — Ambulatory Visit (INDEPENDENT_AMBULATORY_CARE_PROVIDER_SITE_OTHER): Payer: Medicare HMO | Admitting: *Deleted

## 2021-11-12 DIAGNOSIS — E538 Deficiency of other specified B group vitamins: Secondary | ICD-10-CM

## 2021-11-12 MED ORDER — CYANOCOBALAMIN 1000 MCG/ML IJ SOLN
1000.0000 ug | Freq: Once | INTRAMUSCULAR | Status: AC
  Administered 2021-11-12: 1000 ug via INTRAMUSCULAR

## 2021-11-12 NOTE — Progress Notes (Signed)
Pls cosign for B12 inj../lmb  

## 2021-11-14 ENCOUNTER — Other Ambulatory Visit: Payer: Self-pay

## 2021-11-14 ENCOUNTER — Emergency Department (HOSPITAL_COMMUNITY)
Admission: EM | Admit: 2021-11-14 | Discharge: 2021-11-14 | Disposition: A | Discharge status: Home / Self Care | Payer: Medicare HMO | Attending: Emergency Medicine | Admitting: Emergency Medicine

## 2021-11-14 DIAGNOSIS — I10 Essential (primary) hypertension: Secondary | ICD-10-CM | POA: Insufficient documentation

## 2021-11-14 DIAGNOSIS — Z79899 Other long term (current) drug therapy: Secondary | ICD-10-CM | POA: Insufficient documentation

## 2021-11-14 DIAGNOSIS — H6241 Otitis externa in other diseases classified elsewhere, right ear: Secondary | ICD-10-CM | POA: Insufficient documentation

## 2021-11-14 DIAGNOSIS — H9201 Otalgia, right ear: Secondary | ICD-10-CM | POA: Diagnosis present

## 2021-11-14 DIAGNOSIS — B369 Superficial mycosis, unspecified: Secondary | ICD-10-CM | POA: Insufficient documentation

## 2021-11-14 LAB — CBC WITH DIFFERENTIAL/PLATELET
Abs Immature Granulocytes: 0.01 10*3/uL (ref 0.00–0.07)
Basophils Absolute: 0 10*3/uL (ref 0.0–0.1)
Basophils Relative: 0 %
Eosinophils Absolute: 0.1 10*3/uL (ref 0.0–0.5)
Eosinophils Relative: 1 %
HCT: 41.9 % (ref 39.0–52.0)
Hemoglobin: 14.6 g/dL (ref 13.0–17.0)
Immature Granulocytes: 0 %
Lymphocytes Relative: 29 %
Lymphs Abs: 2.3 10*3/uL (ref 0.7–4.0)
MCH: 34.9 pg — ABNORMAL HIGH (ref 26.0–34.0)
MCHC: 34.8 g/dL (ref 30.0–36.0)
MCV: 100.2 fL — ABNORMAL HIGH (ref 80.0–100.0)
Monocytes Absolute: 0.7 10*3/uL (ref 0.1–1.0)
Monocytes Relative: 9 %
Neutro Abs: 4.7 10*3/uL (ref 1.7–7.7)
Neutrophils Relative %: 61 %
Platelets: 274 10*3/uL (ref 150–400)
RBC: 4.18 MIL/uL — ABNORMAL LOW (ref 4.22–5.81)
RDW: 12.6 % (ref 11.5–15.5)
WBC: 7.8 10*3/uL (ref 4.0–10.5)
nRBC: 0 % (ref 0.0–0.2)

## 2021-11-14 LAB — BASIC METABOLIC PANEL
Anion gap: 5 (ref 5–15)
BUN: 15 mg/dL (ref 8–23)
CO2: 27 mmol/L (ref 22–32)
Calcium: 9.3 mg/dL (ref 8.9–10.3)
Chloride: 107 mmol/L (ref 98–111)
Creatinine, Ser: 1.13 mg/dL (ref 0.61–1.24)
GFR, Estimated: >60 mL/min (ref >60)
Glucose, Bld: 112 mg/dL — ABNORMAL HIGH (ref 70–99)
Potassium: 4.2 mmol/L (ref 3.5–5.1)
Sodium: 139 mmol/L (ref 135–145)

## 2021-11-14 MED ORDER — LIDOCAINE HCL 4 % EX SOLN
Freq: Once | CUTANEOUS | Status: AC
  Administered 2021-11-14: 3 mL via TOPICAL
  Filled 2021-11-14: qty 50

## 2021-11-14 MED ORDER — CLOTRIMAZOLE 1 % EX SOLN: Freq: Two times a day (BID) | CUTANEOUS | 1 refills | Status: DC

## 2021-11-14 NOTE — ED Provider Notes (Signed)
[facility] DEPT Provider Note   CSN: [id number] Arrival date & time: 11/14/21  1357     History  Chief Complaint  Patient presents with   Otalgia    Nathan Wallace is a 71 y.o. male with past medical history significant for hyperlipidemia, hypertension reflux, who presents with concern for right ear pain that began around 730/23.  Patient went to the urgent care on 11/10/2021 and started taking antibiotics for presumed ear infection.  He was encouraged to follow-up with ENT given the appearance of his ear at that time.  He returns today reporting no improvement with ear pain.  He denies significant drainage, fever, chills.  He does endorse some muffled hearing.  Patient denies diabetes, HIV, or other immunosuppression.   Otalgia      Home Medications Prior to Admission medications   Medication Sig Start Date End Date Taking? Authorizing Provider  clotrimazole (LOTRIMIN) 1 % external solution Apply topically 2 (two) times daily. Apply 5 drops to the affected ear twice daily for 14 days 11/14/21  Yes Reem Fleury H, PA-C  allopurinol (ZYLOPRIM) 300 MG tablet Take 1 tablet (300 mg total) by mouth daily. 05/27/21   Biagio Borg, MD  amLODipine (NORVASC) 5 MG tablet Take 1 tablet (5 mg total) by mouth daily. 05/27/21   Biagio Borg, MD  amoxicillin-clavulanate (AUGMENTIN) 875-125 MG tablet Take 1 tablet by mouth every 12 (twelve) hours. 11/10/21   Teodora Medici, FNP  Ascorbic Acid (VITAMIN C WITH ROSE HIPS) 1000 MG tablet Take 1 tablet (1,000 mg total) by mouth daily. 05/04/17   Biagio Borg, MD  atorvastatin (LIPITOR) 40 MG tablet Take 1 tablet (40 mg total) by mouth daily. 05/27/21   Biagio Borg, MD  Cholecalciferol (VITAMIN D-3) 1000 units CAPS Take 2 capsules (2,000 Units total) by mouth daily. 05/04/17   Biagio Borg, MD  Cyanocobalamin (B-12 COMPLIANCE INJECTION IJ) Inject 1 Dose as directed every 30 (thirty) days.    [provider]   gabapentin (NEURONTIN) 300 MG capsule TAKE 2 CAPSULES BY MOUTH 3 TIMES A DAY 05/27/21   Biagio Borg, MD  pantoprazole (PROTONIX) 40 MG tablet Take 1 tablet (40 mg total) by mouth daily. 05/27/21   Biagio Borg, MD  PRESCRIPTION MEDICATION 1 Syringe by Intracavernosal route every other day as needed (10-30 minutes before you have sex for ED). Matrix (Papaverine '30mg'$  Phentolamine '1mg'$  Alprostadil 67mg per ml injection)  erectile dysfunction injection every other day as needed for sex    [provider]  tiZANidine (ZANAFLEX) 4 MG tablet TAKE 1 TABLET BY MOUTH 3  TIMES DAILY AS NEEDED FOR  MUSCLE SPASM(S) 01/02/17   JBiagio Borg MD  traMADol (ULTRAM) 50 MG tablet Take 1 tablet (50 mg total) by mouth every 6 (six) hours as needed. 05/28/19   JBiagio Borg MD      Allergies    No known allergies and Nsaids    Review of Systems   Review of Systems  HENT:  Positive for ear pain.   All other systems reviewed and are negative.   Physical Exam Updated Vital Signs BP (!) 183/97 (BP Location: Right Arm)   Pulse (!) 55   Temp 99 F (37.2 C) (Oral)   Resp 16   Ht '5\' 11"'$  (1.803 m)   Wt 78 kg   SpO2 97%   BMI 23.99 kg/m  Physical Exam Vitals and nursing note reviewed.  Constitutional:  General: He is not in acute distress.    Appearance: Normal appearance.  HENT:     Head: Normocephalic and atraumatic.     Ears:     Comments: Normal appearance of left tympanic membrane.  Right tympanic membrane is covered in a thick layer of white fluffy appearing substance suspicious for fungal infection.  He has some preauricular tenderness but no mastoid bone tenderness.  No significant external auditory canal erythema or irritation.  Decreased hearing to whispered voice on right. Eyes:     General:        Right eye: No discharge.        Left eye: No discharge.  Cardiovascular:     Rate and Rhythm: Normal rate and regular rhythm.  Pulmonary:     Effort: Pulmonary effort is normal. No  respiratory distress.  Musculoskeletal:        General: No deformity.  Skin:    General: Skin is warm and dry.  Neurological:     Mental Status: He is alert and oriented to person, place, and time.  Psychiatric:        Mood and Affect: Mood normal.        Behavior: Behavior normal.     ED Results / Procedures / Treatments   Labs (all labs ordered are listed, but only abnormal results are displayed) Labs Reviewed  CBC WITH DIFFERENTIAL/PLATELET - Abnormal; Notable for the following components:      Result Value   RBC 4.18 (*)    MCV 100.2 (*)    MCH 34.9 (*)    All other components within normal limits  BASIC METABOLIC PANEL - Abnormal; Notable for the following components:   Glucose, Bld 112 (*)    All other components within normal limits    EKG None  Radiology No results found.  Procedures Procedures    Medications Ordered in ED Medications  lidocaine (XYLOCAINE) 4 % external solution (3 mLs Topical Given 11/14/21 1657)    ED Course/ Medical Decision Making/ A&P                           Medical Decision Making Risk Prescription drug management.   This is an overall well-appearing 71 year old male who presents with concern for ongoing right ear pain after treatment with systemic antibiotics for presumed acute otitis media.  On my evaluation patient with white fluffy covering of tympanic membrane suspicious for fungal infection.  Members differential diagnosis includes malignant otitis externa, otomycosis, acute otitis media, mastoiditis.  Otitis externa versus other.  Clinically patient with appearance that is most consistent with otomycosis, especially in consideration that he has failed outpatient oral antibiotics we will treat with topical antifungals, given the complexity of patient's ear pain do recommend that he follow-up closely with ENT for reevaluation and recheck.  He is afebrile during his evaluation he does have some hypertension with systolic 098/11.   Encouraged to follow-up with PCP for recheck of blood pressure.  He denies chest pain, vision changes, shortness of breath.  We will help control his pain with topical lidocaine in the emergency department, and prescribe topical clotrimazole drops.  Patient discharged in stable condition at this time, return precautions given. Final Clinical Impression(s) / ED Diagnoses Final diagnoses:  Otomycosis of right ear    Rx / DC Orders ED Discharge Orders          Ordered    clotrimazole (LOTRIMIN) 1 % external solution  2  times daily        11/14/21 1648              Sheyli Horwitz, [place], PA-C 11/14/21 1724    Lucrezia Starch, MD 11/15/21 [phone]

## 2021-11-14 NOTE — ED Provider Triage Note (Signed)
Emergency Medicine Provider Triage Evaluation Note  Nathan Wallace , a 71 y.o. male  was evaluated in triage.  Pt complains of right ear pain.  He states his symptoms started on 11/07/21, he started medications on 11/10/21, and says he was doing better until it got worse yesterday.  Unknown fevers.   No OTC meds for pain.   He has been using homeopathic ear drops since this started.     Physical Exam  BP (!) 155/96   Pulse 67   Temp 98.4 F (36.9 C) (Oral)   Resp 17   Ht '5\' 11"'$  (1.803 m)   Wt 78 kg   SpO2 98%   BMI 23.99 kg/m  Gen:   Awake, no distress   Resp:  Normal effort  MSK:   Moves extremities without difficulty  Other:  Right TM obscured by fluffy white mater, unclear if cotton or fungal infection, right canal is edematous  Medical Decision Making  Medically screening exam initiated at 3:17 PM.  Appropriate orders placed.  Blue Winther was informed that the remainder of the evaluation will be completed by another provider, this initial triage assessment does not replace that evaluation, and the importance of remaining in the ED until their evaluation is complete.     Lorin Glass, [place] 11/14/21 1523

## 2021-11-14 NOTE — ED Notes (Signed)
Pt notes 9/10 right internal and external ear pain with ringing in ear and muffled hearing since last Sunday. Very tender to palpation at right jaw/ear junction and down into right upper neck with swollen lymph node palpated. Pt afebrile in NAD

## 2021-11-14 NOTE — ED Triage Notes (Signed)
Patient reports right ear began hurting 11/07/21, went to UC on 11/10/21 started taking antibiotics. Says antibiotics not helping, hear hurting worse. Pain rated today 9/10.

## 2021-11-14 NOTE — Discharge Instructions (Addendum)
I prescribed some antifungal drops as we discussed to apply to the right ear twice daily for the next 14 days.  Apply 5 drops each time.  Please call ENT first thing tomorrow morning to ensure follow-up for resolution.  You begin to experience worsening ear pain, drainage, fever, chills, nausea, vomiting recommend that you return for further evaluation.

## 2021-12-08 DIAGNOSIS — H903 Sensorineural hearing loss, bilateral: Secondary | ICD-10-CM | POA: Insufficient documentation

## 2021-12-08 DIAGNOSIS — H9311 Tinnitus, right ear: Secondary | ICD-10-CM | POA: Insufficient documentation

## 2021-12-08 HISTORY — DX: Tinnitus, right ear: H93.11

## 2021-12-15 ENCOUNTER — Ambulatory Visit (INDEPENDENT_AMBULATORY_CARE_PROVIDER_SITE_OTHER): Payer: Medicare HMO

## 2021-12-15 ENCOUNTER — Telehealth: Payer: Self-pay

## 2021-12-15 DIAGNOSIS — Z23 Encounter for immunization: Secondary | ICD-10-CM | POA: Diagnosis not present

## 2021-12-15 DIAGNOSIS — E538 Deficiency of other specified B group vitamins: Secondary | ICD-10-CM

## 2021-12-15 MED ORDER — CYANOCOBALAMIN 1000 MCG/ML IJ SOLN
1000.0000 ug | Freq: Once | INTRAMUSCULAR | Status: AC
  Administered 2021-12-15: 1000 ug via INTRAMUSCULAR

## 2021-12-15 NOTE — Progress Notes (Signed)
After obtaining consent, and per orders of Dr. Jenny Reichmann, injection of B12 given on the left deltoid and shingrix on the right deltoid by Marrian Salvage. Patient tolerated well and instructed  to report any adverse reaction to me immediately.

## 2021-12-15 NOTE — Telephone Encounter (Signed)
After noticing pt is on Medicare, called pt and reported to them that they will be billed for the Shingrix, pt stated why would he be billed for it. I explain to pt Medicare does not pay for certain vaccine like the Shnigrix here but would pay for it at his local pharmacy. Pt showed understanding that they would be billed and make sure to let them know that they have 3-6 month window to get their second dose and should get it at their local pharmacy

## 2021-12-24 ENCOUNTER — Ambulatory Visit (INDEPENDENT_AMBULATORY_CARE_PROVIDER_SITE_OTHER): Payer: Medicare HMO

## 2021-12-24 VITALS — Ht 68.0 in | Wt 178.0 lb

## 2021-12-24 DIAGNOSIS — Z0000 Encounter for general adult medical examination without abnormal findings: Secondary | ICD-10-CM

## 2021-12-24 NOTE — Patient Instructions (Signed)
Mr. Nathan Wallace , Thank you for taking time to come for your Medicare Wellness Visit. I appreciate your ongoing commitment to your health goals. Please review the following plan we discussed and let me know if I can assist you in the future.   Screening recommendations/referrals: Colonoscopy: 1/28/20212; no longer recommended Recommended yearly ophthalmology/optometry visit for glaucoma screening and checkup Recommended yearly dental visit for hygiene and checkup  Vaccinations: Influenza vaccine: due Fall 2023 Pneumococcal vaccine: 05/18/2017, 05/22/2018 Tdap vaccine: 02/23/2012; due every 10 years (due 02/2022) Shingles vaccine: 12/15/2021; check with pharmacy for cost of second dose   Covid-19: 05/24/2019, 06/18/2019, 01/21/2020, 07/30/2020, 03/03/2021  Advanced directives: No; Advance directive discussed with you today. I have provided a copy for you to complete at home and have notarized. Once this is complete please bring a copy in to our office so we can scan it into your chart.  Conditions/risks identified: Yes  Next appointment: Follow up in one year for your annual wellness visit.   Preventive Care 35 Years and Older, Male  Preventive care refers to lifestyle choices and visits with your health care provider that can promote health and wellness. What does preventive care include? A yearly physical exam. This is also called an annual well check. Dental exams once or twice a year. Routine eye exams. Ask your health care provider how often you should have your eyes checked. Personal lifestyle choices, including: Daily care of your teeth and gums. Regular physical activity. Eating a healthy diet. Avoiding tobacco and drug use. Limiting alcohol use. Practicing safe sex. Taking low doses of aspirin every day. Taking vitamin and mineral supplements as recommended by your health care provider. What happens during an annual well check? The services and screenings done by your health care  provider during your annual well check will depend on your age, overall health, lifestyle risk factors, and family history of disease. Counseling  Your health care provider may ask you questions about your: Alcohol use. Tobacco use. Drug use. Emotional well-being. Home and relationship well-being. Sexual activity. Eating habits. History of falls. Memory and ability to understand (cognition). Work and work Statistician. Screening  You may have the following tests or measurements: Height, weight, and BMI. Blood pressure. Lipid and cholesterol levels. These may be checked every 5 years, or more frequently if you are over 64 years old. Skin check. Lung cancer screening. You may have this screening every year starting at age 58 if you have a 30-pack-year history of smoking and currently smoke or have quit within the past 15 years. Fecal occult blood test (FOBT) of the stool. You may have this test every year starting at age 59. Flexible sigmoidoscopy or colonoscopy. You may have a sigmoidoscopy every 5 years or a colonoscopy every 10 years starting at age 40. Prostate cancer screening. Recommendations will vary depending on your family history and other risks. Hepatitis C blood test. Hepatitis B blood test. Sexually transmitted disease (STD) testing. Diabetes screening. This is done by checking your blood sugar (glucose) after you have not eaten for a while (fasting). You may have this done every 1-3 years. Abdominal aortic aneurysm (AAA) screening. You may need this if you are a current or former smoker. Osteoporosis. You may be screened starting at age 74 if you are at high risk. Talk with your health care provider about your test results, treatment options, and if necessary, the need for more tests. Vaccines  Your health care provider may recommend certain vaccines, such as: Influenza vaccine. This  is recommended every year. Tetanus, diphtheria, and acellular pertussis (Tdap, Td)  vaccine. You may need a Td booster every 10 years. Zoster vaccine. You may need this after age 49. Pneumococcal 13-valent conjugate (PCV13) vaccine. One dose is recommended after age 68. Pneumococcal polysaccharide (PPSV23) vaccine. One dose is recommended after age 69. Talk to your health care provider about which screenings and vaccines you need and how often you need them. This information is not intended to replace advice given to you by your health care provider. Make sure you discuss any questions you have with your health care provider. Document Released: 04/24/2015 Document Revised: 12/16/2015 Document Reviewed: 01/27/2015 Elsevier Interactive Patient Education  2017 [place] Prevention in the Home Falls can cause injuries. They can happen to people of all ages. There are many things you can do to make your home safe and to help prevent falls. What can I do on the outside of my home? Regularly fix the edges of walkways and driveways and fix any cracks. Remove anything that might make you trip as you walk through a door, such as a raised step or threshold. Trim any bushes or trees on the path to your home. Use bright outdoor lighting. Clear any walking paths of anything that might make someone trip, such as rocks or tools. Regularly check to see if handrails are loose or broken. Make sure that both sides of any steps have handrails. Any raised decks and porches should have guardrails on the edges. Have any leaves, snow, or ice cleared regularly. Use sand or salt on walking paths during winter. Clean up any spills in your garage right away. This includes oil or grease spills. What can I do in the bathroom? Use night lights. Install grab bars by the toilet and in the tub and shower. Do not use towel bars as grab bars. Use non-skid mats or decals in the tub or shower. If you need to sit down in the shower, use a plastic, non-slip stool. Keep the floor dry. Clean up any  water that spills on the floor as soon as it happens. Remove soap buildup in the tub or shower regularly. Attach bath mats securely with double-sided non-slip rug tape. Do not have throw rugs and other things on the floor that can make you trip. What can I do in the bedroom? Use night lights. Make sure that you have a light by your bed that is easy to reach. Do not use any sheets or blankets that are too big for your bed. They should not hang down onto the floor. Have a firm chair that has side arms. You can use this for support while you get dressed. Do not have throw rugs and other things on the floor that can make you trip. What can I do in the kitchen? Clean up any spills right away. Avoid walking on wet floors. Keep items that you use a lot in easy-to-reach places. If you need to reach something above you, use a strong step stool that has a grab bar. Keep electrical cords out of the way. Do not use floor polish or wax that makes floors slippery. If you must use wax, use non-skid floor wax. Do not have throw rugs and other things on the floor that can make you trip. What can I do with my stairs? Do not leave any items on the stairs. Make sure that there are handrails on both sides of the stairs and use them. Fix handrails that  are broken or loose. Make sure that handrails are as long as the stairways. Check any carpeting to make sure that it is firmly attached to the stairs. Fix any carpet that is loose or worn. Avoid having throw rugs at the top or bottom of the stairs. If you do have throw rugs, attach them to the floor with carpet tape. Make sure that you have a light switch at the top of the stairs and the bottom of the stairs. If you do not have them, ask someone to add them for you. What else can I do to help prevent falls? Wear shoes that: Do not have high heels. Have rubber bottoms. Are comfortable and fit you well. Are closed at the toe. Do not wear sandals. If you use a  stepladder: Make sure that it is fully opened. Do not climb a closed stepladder. Make sure that both sides of the stepladder are locked into place. Ask someone to hold it for you, if possible. Clearly mark and make sure that you can see: Any grab bars or handrails. First and last steps. Where the edge of each step is. Use tools that help you move around (mobility aids) if they are needed. These include: Canes. Walkers. Scooters. Crutches. Turn on the lights when you go into a dark area. Replace any light bulbs as soon as they burn out. Set up your furniture so you have a clear path. Avoid moving your furniture around. If any of your floors are uneven, fix them. If there are any pets around you, be aware of where they are. Review your medicines with your doctor. Some medicines can make you feel dizzy. This can increase your chance of falling. Ask your doctor what other things that you can do to help prevent falls. This information is not intended to replace advice given to you by your health care provider. Make sure you discuss any questions you have with your health care provider. Document Released: 01/22/2009 Document Revised: 09/03/2015 Document Reviewed: 05/02/2014 Elsevier Interactive Patient Education  2017 [employer].

## 2021-12-24 NOTE — Progress Notes (Signed)
Virtual Visit via Telephone Note  I connected with  Nathan Wallace on 12/24/21 at 10:45 AM EDT by telephone and verified that I am speaking with the correct person using two identifiers.  Location: Patient: Home Provider: [place] Persons participating in the virtual visit: [place]   I discussed the limitations, risks, security and privacy concerns of performing an evaluation and management service by telephone and the availability of in person appointments. The patient expressed understanding and agreed to proceed.  Interactive audio and video telecommunications were attempted between this nurse and patient, however failed, due to patient having technical difficulties OR patient did not have access to video capability.  We continued and completed visit with audio only.  Some vital signs may be absent or patient reported.   Sheral Flow, LPN  Subjective:   Nathan Wallace is a 71 y.o. male who presents for Medicare Annual/Subsequent preventive examination.  Review of Systems     Cardiac Risk Factors include: advanced age (>6mn, >>16women);dyslipidemia;hypertension;male gender;family history of premature cardiovascular disease     Objective:    Today's Vitals   12/24/21 1616  Weight: 178 lb (80.7 kg)  Height: '5\' 8"'$  (1.727 m)   Body mass index is 27.06 kg/m.     12/24/2021    4:18 PM 11/14/2021    2:40 PM 10/25/2016    4:28 PM 10/17/2016    8:59 AM 08/12/2016   11:26 AM 06/26/2016    8:27 AM 03/08/2016   11:08 AM  Advanced Directives  Does Patient Have a Medical Advance Directive? No No No No No No No  Would patient like information on creating a medical advance directive? Yes (MAU/Ambulatory/Procedural Areas - Information given)  No - Patient declined  Yes (MAU/Ambulatory/Procedural Areas - Information given)  No - Patient declined    Current Medications (verified) Outpatient Encounter Medications as of 12/24/2021  Medication Sig    allopurinol (ZYLOPRIM) 300 MG tablet Take 1 tablet (300 mg total) by mouth daily.   amLODipine (NORVASC) 5 MG tablet Take 1 tablet (5 mg total) by mouth daily.   amoxicillin-clavulanate (AUGMENTIN) 875-125 MG tablet Take 1 tablet by mouth every 12 (twelve) hours.   Ascorbic Acid (VITAMIN C WITH ROSE HIPS) 1000 MG tablet Take 1 tablet (1,000 mg total) by mouth daily.   atorvastatin (LIPITOR) 40 MG tablet Take 1 tablet (40 mg total) by mouth daily.   Cholecalciferol (VITAMIN D-3) 1000 units CAPS Take 2 capsules (2,000 Units total) by mouth daily.   clotrimazole (LOTRIMIN) 1 % external solution Apply topically 2 (two) times daily. Apply 5 drops to the affected ear twice daily for 14 days   Cyanocobalamin (B-12 COMPLIANCE INJECTION IJ) Inject 1 Dose as directed every 30 (thirty) days.   gabapentin (NEURONTIN) 300 MG capsule TAKE 2 CAPSULES BY MOUTH 3 TIMES A DAY   pantoprazole (PROTONIX) 40 MG tablet Take 1 tablet (40 mg total) by mouth daily.   PRESCRIPTION MEDICATION 1 Syringe by Intracavernosal route every other day as needed (10-30 minutes before you have sex for ED). Matrix (Papaverine '30mg'$  Phentolamine '1mg'$  Alprostadil 464m per ml injection)  erectile dysfunction injection every other day as needed for sex   tiZANidine (ZANAFLEX) 4 MG tablet TAKE 1 TABLET BY MOUTH 3  TIMES DAILY AS NEEDED FOR  MUSCLE SPASM(S)   traMADol (ULTRAM) 50 MG tablet Take 1 tablet (50 mg total) by mouth every 6 (six) hours as needed.   Facility-Administered Encounter Medications as of 12/24/2021  Medication  cyanocobalamin ((VITAMIN B-12)) injection 1,000 mcg    Allergies (verified) No known allergies and Nsaids   History: Past Medical History:  Diagnosis Date   AKI (acute kidney injury) ([place])    Arthritis    RT KNEE AND RT SHOULDER   B12 deficiency    Benign head tremor    Chest tightness    comes and goes    CVA (cerebrovascular accident due to intracerebral hemorrhage) ([place]) 08/03/2011   Degenerative  joint disease of knee, right    Depression 07/26/2012   no problems now   Diverticulosis    GERD (gastroesophageal reflux disease)    NO RECENT PROBLEMS AND NO MEDS   History of blood transfusion    History of kidney stones    History of urinary tract infection    Hyperlipidemia    border line   Hypertension    Impaired glucose tolerance 06/22/2011   Left inguinal hernia    Macrocytic anemia    MGUS (monoclonal gammopathy of unknown significance)    Prostate cancer ([place]) 2011   Rotator cuff tear arthropathy of right shoulder, with instability 10/25/2016   Sepsis ([place])    Stress incontinence    Stroke [facility])    2013   Syncope 2012   after prostate surgery; neg neurology work up.    Past Surgical History:  Procedure Laterality Date   COLONOSCOPY     INGUINAL HERNIA REPAIR  06/01/2011   Procedure: HERNIA REPAIR INGUINAL ADULT;  Surgeon: Adin Hector, MD;  Location: [facility];  Service: General;  Laterality: Left;  Left Inguinal Hernia Repair with Mesh   ROBOT ASSISTED LAPAROSCOPIC RADICAL PROSTATECTOMY  jan 2012   da vinci   SPINE SURGERY  2003 OR 2004   CERVICAL FUSION-STATES LIMITED ROM NECK   TOTAL SHOULDER ARTHROPLASTY Right 10/25/2016   TOTAL SHOULDER ARTHROPLASTY Right 10/25/2016   Procedure: RIGHT TOTAL SHOULDER ARTHROPLASTY;  Surgeon: Marchia Bond, MD;  Location: [place];  Service: Orthopedics;  Laterality: Right;   URETHRAL SLING N/A 03/08/2016   Procedure: MALE SLING CYSTOSCOPY;  Surgeon: Bjorn Loser, MD;  Location: [facility];  Service: Urology;  Laterality: N/A;   WRIST SURGERY     right    Family History  Problem Relation Age of Onset   Hypertension Mother    Aneurysm Mother        brain   Colon cancer Father 55   Heart attack Brother    Social History   Socioeconomic History   Marital status: Married    Spouse name: Not on file   Number of children: 3   Years of education: Not on file   Highest education level: Not on file  Occupational History     Employer: RETIRED    Comment: retired Art gallery manager.    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  Tobacco Use   Smoking status: Former    Packs/day: 0.50    Years: 1.00    Total pack years: 0.50    Types: Cigarettes    Quit date: 06/28/1966    Years since quitting: 55.5   Smokeless tobacco: Never  Vaping Use   Vaping Use: Never used  Substance and Sexual Activity   Alcohol use: Yes    Comment: 3 beers daily; liquor 2 glasses every other day    Drug use: Not Currently    Types: Marijuana    Comment: last time 1 week ago at a party   Sexual activity: Not on file  Other Topics Concern  Not on file  Social History Narrative   Not on file   Social Determinants of Health   Financial Resource Strain: Low Risk  (12/24/2021)   Overall Financial Resource Strain (CARDIA)    Difficulty of Paying Living Expenses: Not hard at all  Food Insecurity: No Food Insecurity (12/24/2021)   Hunger Vital Sign    Worried About Running Out of Food in the Last Year: Never true    Ran Out of Food in the Last Year: Never true  Transportation Needs: No Transportation Needs (12/24/2021)   PRAPARE - Hydrologist (Medical): No    Lack of Transportation (Non-Medical): No  Physical Activity: Sufficiently Active (12/24/2021)   Exercise Vital Sign    Days of Exercise per Week: 7 days    Minutes of Exercise per Session: 30 min  Stress: No Stress Concern Present (12/24/2021)   [place]    Feeling of Stress : Not at all  Social Connections: [place] (12/24/2021)   Social Connection and Isolation Panel [NHANES]    Frequency of Communication with Friends and Family: More than three times a week    Frequency of Social Gatherings with Friends and Family: More than three times a week    Attends Religious Services: More than 4 times per year    Active Member of [employer] or Organizations: Yes     Attends Music therapist: More than 4 times per year    Marital Status: Married    Tobacco Counseling Counseling given: Not Answered   Clinical Intake:  Pre-visit preparation completed: Yes  Pain : No/denies pain     BMI - recorded: 27.06 Nutritional Status: BMI 25 -29 Overweight Nutritional Risks: None Diabetes: No  How often do you need to have someone help you when you read instructions, pamphlets, or other written materials from your doctor or pharmacy?: 1 - Never What is the last grade level you completed in school?: HSG  Diabetic? no  Interpreter Needed?: No  Information entered by :: Lisette Abu, LPN.   Activities of Daily Living    12/24/2021    4:31 PM  In your present state of health, do you have any difficulty performing the following activities:  Hearing? 0  Vision? 0  Difficulty concentrating or making decisions? 0  Walking or climbing stairs? 0  Dressing or bathing? 0  Doing errands, shopping? 0  Preparing Food and eating ? N  Using the Toilet? N  In the past six months, have you accidently leaked urine? N  Do you have problems with loss of bowel control? N  Managing your Medications? N  Managing your Finances? N  Housekeeping or managing your Housekeeping? N    Patient Care Team: Biagio Borg, MD as PCP - General Clearnce Sorrel, MD as Attending Physician (Neurology) Earnie Larsson, MD (Neurosurgery) Heath Lark, MD as Consulting Physician (Hematology and Oncology)  Indicate any recent Medical Services you may have received from other than [facility] providers in the past year (date may be approximate).     Assessment:   This is a routine wellness examination for [place].  Hearing/Vision screen Hearing Screening - Comments:: Denies hearing difficulties   Vision Screening - Comments:: Wears rx glasses - up to date with routine eye exams with [facility]   Dietary issues and exercise activities discussed: Current Exercise  Habits: Home exercise routine, Type of exercise: walking;Other - see comments (home repair, maintenance, elctrical repair),  Time (Minutes): 60, Frequency (Times/Week): 7, Weekly Exercise (Minutes/Week): 420, Exercise limited by: orthopedic condition(s)   Goals Addressed             This Visit's Progress    Stay physically active        Depression Screen    12/24/2021    4:29 PM 05/27/2021    9:09 AM 05/27/2021    8:51 AM 05/21/2020    9:25 AM 05/21/2020    8:50 AM 05/28/2019    9:50 AM 05/28/2019    9:15 AM  PHQ 2/9 Scores  PHQ - 2 Score 0 '1 1 1 3 '$ 0 0    Fall Risk    12/24/2021    4:18 PM 05/27/2021    9:09 AM 05/27/2021    8:51 AM 05/21/2020    9:25 AM 05/28/2019    9:50 AM  Fall Risk   Falls in the past year? 0 0 0 0 0  Number falls in past yr: 0 0 0    Injury with Fall? 0 0 0    Risk for fall due to : No Fall Risks      Follow up Falls prevention discussed        [place]:  Any stairs in or around the home? Yes  If so, are there any without handrails? No  Home free of loose throw rugs in walkways, pet beds, electrical cords, etc? Yes  Adequate lighting in your home to reduce risk of falls? Yes   ASSISTIVE DEVICES UTILIZED TO PREVENT FALLS:  Life alert? No  Use of a cane, walker or w/c? No  Grab bars in the bathroom? No  Shower chair or bench in shower? No  Elevated toilet seat or a handicapped toilet? No   TIMED UP AND GO:  Was the test performed? No , phone visit  Cognitive Function:        12/24/2021    4:33 PM  6CIT Screen  What Year? 0 points  What month? 0 points  What time? 0 points  Count back from 20 0 points  Months in reverse 0 points  Repeat phrase 0 points  Total Score 0 points    Immunizations Immunization History  Administered Date(s) Administered   Fluad Quad(high Dose 65+) 12/06/2018, 01/06/2020, 01/21/2021   Influenza Split 12/19/2011   Influenza, High Dose Seasonal PF 12/25/2015, 02/02/2017,  01/05/2018   Influenza,inj,Quad PF,6+ Mos 02/28/2013, 04/09/2014, 01/05/2015   PFIZER Comirnaty(Gray Top)Covid-19 Tri-Sucrose Vaccine 07/30/2020   PFIZER(Purple Top)SARS-COV-2 Vaccination 05/24/2019, 06/18/2019, 01/21/2020   Pfizer Covid-19 Vaccine Bivalent Booster 66yr & up 03/03/2021   Pneumococcal Conjugate-13 05/18/2017   Pneumococcal Polysaccharide-23 05/22/2018   Td 08/26/2008   Tdap 02/23/2012   Zoster Recombinat (Shingrix) 12/15/2021   Zoster, Live 07/26/2012    TDAP status: Up to date (due 02/2022)  Flu Vaccine status: Due, Education has been provided regarding the importance of this vaccine. Advised may receive this vaccine at local pharmacy or Health Dept. Aware to provide a copy of the vaccination record if obtained from local pharmacy or Health Dept. Verbalized acceptance and understanding.  Pneumococcal vaccine status: Up to date  Covid-19 vaccine status: Completed vaccines  Qualifies for Shingles Vaccine? Yes   Zostavax completed Yes   Shingrix Completed?: No.    Education has been provided regarding the importance of this vaccine. Patient has been advised to call insurance company to determine out of pocket expense if they have not yet received this vaccine. Advised  may also receive vaccine at local pharmacy or Health Dept. Verbalized acceptance and understanding.  Screening Tests Health Maintenance  Topic Date Due   COVID-19 Vaccine (6 - Pfizer risk series) 04/28/2021   INFLUENZA VACCINE  11/09/2021   Zoster Vaccines- Shingrix (2 of 2) 02/09/2022   TETANUS/TDAP  02/22/2022   Pneumonia Vaccine 79+ Years old  Completed   Hepatitis C Screening  Completed   HPV VACCINES  Aged Out   COLONOSCOPY (Pts 45-66yr Insurance coverage will need to be confirmed)  Discontinued    Health Maintenance  Health Maintenance Due  Topic Date Due   COVID-19 Vaccine (6 - Pfizer risk series) 04/28/2021   INFLUENZA VACCINE  11/09/2021    Colorectal cancer screening: No longer  required.   Lung Cancer Screening: (Low Dose CT Chest recommended if Age 72-80years, 30 pack-year currently smoking OR have quit w/in 15years.) does not qualify.   Lung Cancer Screening Referral: no  Additional Screening:  Hepatitis C Screening: does qualify; Completed 09/23/2015  Vision Screening: Recommended annual ophthalmology exams for early detection of glaucoma and other disorders of the eye. Is the patient up to date with their annual eye exam?  Yes  Who is the provider or what is the name of the office in which the patient attends annual eye exams? [facility] If pt is not established with a provider, would they like to be referred to a provider to establish care? No .   Dental Screening: Recommended annual dental exams for proper oral hygiene  Community Resource Referral / Chronic Care Management: CRR required this visit?  No   CCM required this visit?  No      Plan:     I have personally reviewed and noted the following in the patient's chart:   Medical and social history Use of alcohol, tobacco or illicit drugs  Current medications and supplements including opioid prescriptions. Patient is not currently taking opioid prescriptions. Functional ability and status Nutritional status Physical activity Advanced directives List of other physicians Hospitalizations, surgeries, and ER visits in previous 12 months Vitals Screenings to include cognitive, depression, and falls Referrals and appointments  In addition, I have reviewed and discussed with patient certain preventive protocols, quality metrics, and best practice recommendations. A written personalized care plan for preventive services as well as general preventive health recommendations were provided to patient.     SSheral Flow LPN   [id number]  Nurse Notes:  Patient is cogitatively intact. There were no vitals filed for this visit. Patient stated that he has no issues with gait or balance; does  not use any assistive devices.

## 2022-01-14 ENCOUNTER — Ambulatory Visit (INDEPENDENT_AMBULATORY_CARE_PROVIDER_SITE_OTHER): Payer: Medicare HMO

## 2022-01-14 DIAGNOSIS — E538 Deficiency of other specified B group vitamins: Secondary | ICD-10-CM

## 2022-01-14 MED ORDER — CYANOCOBALAMIN 1000 MCG/ML IJ SOLN
1000.0000 ug | Freq: Once | INTRAMUSCULAR | Status: AC
  Administered 2022-01-14: 1000 ug via INTRAMUSCULAR

## 2022-01-14 NOTE — Progress Notes (Signed)
Patient here for monthly B12 injection per Dr. Jenny Reichmann, B12 1000 mcg given IM in left deltoid. Patient tolerated injection well today.

## 2022-01-18 ENCOUNTER — Inpatient Hospital Stay: Payer: Medicare HMO | Attending: Hematology and Oncology

## 2022-01-18 ENCOUNTER — Other Ambulatory Visit: Payer: Self-pay

## 2022-01-18 DIAGNOSIS — D472 Monoclonal gammopathy: Secondary | ICD-10-CM | POA: Diagnosis not present

## 2022-01-18 DIAGNOSIS — Z79899 Other long term (current) drug therapy: Secondary | ICD-10-CM | POA: Diagnosis not present

## 2022-01-18 LAB — COMPREHENSIVE METABOLIC PANEL
ALT: 33 U/L (ref 0–44)
AST: 24 U/L (ref 15–41)
Albumin: 4.4 g/dL (ref 3.5–5.0)
Alkaline Phosphatase: 64 U/L (ref 38–126)
Anion gap: 4 — ABNORMAL LOW (ref 5–15)
BUN: 18 mg/dL (ref 8–23)
CO2: 30 mmol/L (ref 22–32)
Calcium: 9.4 mg/dL (ref 8.9–10.3)
Chloride: 105 mmol/L (ref 98–111)
Creatinine, Ser: 1.15 mg/dL (ref 0.61–1.24)
GFR, Estimated: >60 mL/min (ref >60)
Glucose, Bld: 133 mg/dL — ABNORMAL HIGH (ref 70–99)
Potassium: 3.9 mmol/L (ref 3.5–5.1)
Sodium: 139 mmol/L (ref 135–145)
Total Bilirubin: 1.2 mg/dL (ref 0.3–1.2)
Total Protein: 7.9 g/dL (ref 6.5–8.1)

## 2022-01-18 LAB — CBC WITH DIFFERENTIAL/PLATELET
Abs Immature Granulocytes: 0.01 10*3/uL (ref 0.00–0.07)
Basophils Absolute: 0 10*3/uL (ref 0.0–0.1)
Basophils Relative: 0 %
Eosinophils Absolute: 0.1 10*3/uL (ref 0.0–0.5)
Eosinophils Relative: 1 %
HCT: 39.6 % (ref 39.0–52.0)
Hemoglobin: 13.8 g/dL (ref 13.0–17.0)
Immature Granulocytes: 0 %
Lymphocytes Relative: 32 %
Lymphs Abs: 2.1 10*3/uL (ref 0.7–4.0)
MCH: 33.9 pg (ref 26.0–34.0)
MCHC: 34.8 g/dL (ref 30.0–36.0)
MCV: 97.3 fL (ref 80.0–100.0)
Monocytes Absolute: 0.5 10*3/uL (ref 0.1–1.0)
Monocytes Relative: 8 %
Neutro Abs: 3.8 10*3/uL (ref 1.7–7.7)
Neutrophils Relative %: 59 %
Platelets: 287 10*3/uL (ref 150–400)
RBC: 4.07 MIL/uL — ABNORMAL LOW (ref 4.22–5.81)
RDW: 12.6 % (ref 11.5–15.5)
WBC: 6.5 10*3/uL (ref 4.0–10.5)
nRBC: 0 % (ref 0.0–0.2)

## 2022-01-19 LAB — KAPPA/LAMBDA LIGHT CHAINS
Kappa free light chain: 46.2 mg/L — ABNORMAL HIGH (ref 3.3–19.4)
Kappa, lambda light chain ratio: 3.58 — ABNORMAL HIGH (ref 0.26–1.65)
Lambda free light chains: 12.9 mg/L (ref 5.7–26.3)

## 2022-01-24 LAB — MULTIPLE MYELOMA PANEL, SERUM
Albumin SerPl Elph-Mcnc: 4 g/dL (ref 2.9–4.4)
Albumin/Glob SerPl: 1.3 (ref 0.7–1.7)
Alpha 1: 0.2 g/dL (ref 0.0–0.4)
Alpha2 Glob SerPl Elph-Mcnc: 0.6 g/dL (ref 0.4–1.0)
B-Globulin SerPl Elph-Mcnc: 1.3 g/dL (ref 0.7–1.3)
Gamma Glob SerPl Elph-Mcnc: 1.3 g/dL (ref 0.4–1.8)
Globulin, Total: 3.3 g/dL (ref 2.2–3.9)
IgA: 369 mg/dL (ref 61–437)
IgG (Immunoglobin G), Serum: 882 mg/dL (ref 603–1613)
IgM (Immunoglobulin M), Srm: 42 mg/dL (ref 15–143)
M Protein SerPl Elph-Mcnc: 0.8 g/dL — ABNORMAL HIGH
Total Protein ELP: 7.3 g/dL (ref 6.0–8.5)

## 2022-01-25 ENCOUNTER — Other Ambulatory Visit: Payer: Self-pay

## 2022-01-25 ENCOUNTER — Encounter: Payer: Self-pay | Admitting: Hematology and Oncology

## 2022-01-25 ENCOUNTER — Inpatient Hospital Stay: Payer: Medicare HMO | Admitting: Hematology and Oncology

## 2022-01-25 VITALS — BP 138/83 | HR 56 | Temp 97.9°F | Resp 18 | Ht 68.0 in | Wt 164.8 lb

## 2022-01-25 DIAGNOSIS — D472 Monoclonal gammopathy: Secondary | ICD-10-CM

## 2022-01-25 DIAGNOSIS — Z79899 Other long term (current) drug therapy: Secondary | ICD-10-CM | POA: Diagnosis not present

## 2022-01-25 NOTE — Assessment & Plan Note (Addendum)
Clinically, he has no signs of disease progression. Plan to see him next year in with repeat blood work, examination and blood work. Recommend annual influenza vaccination I recommend the patient to increase fluid hydration prior to blood draw in the future

## 2022-01-25 NOTE — Progress Notes (Signed)
[place] OFFICE PROGRESS NOTE  Patient Care Team: Biagio Borg, MD as PCP - General Clearnce Sorrel, MD as Attending Physician (Neurology) Earnie Larsson, MD (Neurosurgery) Heath Lark, MD as Consulting Physician (Hematology and Oncology)  ASSESSMENT & PLAN:  MGUS (monoclonal gammopathy of unknown significance) Clinically, he has no signs of disease progression. Plan to see him next year in with repeat blood work, examination and blood work. Recommend annual influenza vaccination I recommend the patient to increase fluid hydration prior to blood draw in the future   Orders Placed This Encounter  Procedures   CMP ([place] only)    Standing Status:   Future    Standing Expiration Date:   01/26/2023   CBC with Differential (Cancer Center Only)    Standing Status:   Future    Standing Expiration Date:   01/26/2023   Kappa/lambda light chains    Standing Status:   Future    Standing Expiration Date:   01/26/2023   Multiple Myeloma Panel (SPEP&IFE w/QIG)    Standing Status:   Future    Standing Expiration Date:   01/26/2023    All questions were answered. The patient knows to call the clinic with any problems, questions or concerns. The total time spent in the appointment was 20 minutes encounter with patients including review of chart and various tests results, discussions about plan of care and coordination of care plan   Heath Lark, MD 01/25/2022 10:09 AM  INTERVAL HISTORY: Please see below for problem oriented charting. he returns for surveillance follow-up for IgG kappa MGUS He is doing well He is physically active and is still working No recent infection or back pain  REVIEW OF SYSTEMS:   Constitutional: Denies fevers, chills or abnormal weight loss Eyes: Denies blurriness of vision Ears, nose, mouth, throat, and face: Denies mucositis or sore throat Respiratory: Denies cough, dyspnea or wheezes Cardiovascular: Denies palpitation, chest  discomfort or lower extremity swelling Gastrointestinal:  Denies nausea, heartburn or change in bowel habits Skin: Denies abnormal skin rashes Lymphatics: Denies new lymphadenopathy or easy bruising Neurological:Denies numbness, tingling or new weaknesses Behavioral/Psych: Mood is stable, no new changes  All other systems were reviewed with the patient and are negative.  I have reviewed the past medical history, past surgical history, social history and family history with the patient and they are unchanged from previous note.  ALLERGIES:  is allergic to no known allergies and nsaids.  MEDICATIONS:  Current Outpatient Medications  Medication Sig Dispense Refill   allopurinol (ZYLOPRIM) 300 MG tablet Take 1 tablet (300 mg total) by mouth daily. 90 tablet 3   amLODipine (NORVASC) 5 MG tablet Take 1 tablet (5 mg total) by mouth daily. 90 tablet 3   amoxicillin-clavulanate (AUGMENTIN) 875-125 MG tablet Take 1 tablet by mouth every 12 (twelve) hours. 14 tablet 0   Ascorbic Acid (VITAMIN C WITH ROSE HIPS) 1000 MG tablet Take 1 tablet (1,000 mg total) by mouth daily. 90 tablet 0   atorvastatin (LIPITOR) 40 MG tablet Take 1 tablet (40 mg total) by mouth daily. 90 tablet 3   Cholecalciferol (VITAMIN D-3) 1000 units CAPS Take 2 capsules (2,000 Units total) by mouth daily. 180 capsule 0   clotrimazole (LOTRIMIN) 1 % external solution Apply topically 2 (two) times daily. Apply 5 drops to the affected ear twice daily for 14 days 30 mL 1   Cyanocobalamin (B-12 COMPLIANCE INJECTION IJ) Inject 1 Dose as directed every 30 (thirty) days.  gabapentin (NEURONTIN) 300 MG capsule TAKE 2 CAPSULES BY MOUTH 3 TIMES A DAY 90 capsule 5   pantoprazole (PROTONIX) 40 MG tablet Take 1 tablet (40 mg total) by mouth daily. 90 tablet 3   PRESCRIPTION MEDICATION 1 Syringe by Intracavernosal route every other day as needed (10-30 minutes before you have sex for ED). Matrix (Papaverine 6m Phentolamine 123mAlprostadil 4037m per ml injection)  erectile dysfunction injection every other day as needed for sex     tiZANidine (ZANAFLEX) 4 MG tablet TAKE 1 TABLET BY MOUTH 3  TIMES DAILY AS NEEDED FOR  MUSCLE SPASM(S) 270 tablet 1   traMADol (ULTRAM) 50 MG tablet Take 1 tablet (50 mg total) by mouth every 6 (six) hours as needed. 30 tablet 0   Current Facility-Administered Medications  Medication Dose Route Frequency Provider Last Rate Last Admin   cyanocobalamin ((VITAMIN B-12)) injection 1,000 mcg  1,000 mcg Intramuscular Q30 days JohBiagio BorgD   1,000 mcg at 10/13/21 [id number] SUMMARY OF ONCOLOGIC HISTORY: This is a pleasant gentleman who was diagnosed with monoclonal gammopathy of unknown significance in 2013. The patient has history of anemia. Subsequent evaluation found that the patient had B12 deficiency. He was placed on B12 replacement therapy. 24-hour urine collection x-ray and blood work did not show evidence of end organ damage and he is being observed. He also has history of prostate cancer status post resection. His follow closely with his urologist.  PHYSICAL EXAMINATION: ECOG PERFORMANCE STATUS: 0 - Asymptomatic  Vitals:   01/25/22 0927  BP: 138/83  Pulse: (!) 56  Resp: 18  Temp: 97.9 F (36.6 C)  SpO2: 99%   Filed Weights   01/25/22 0927  Weight: 164 lb 12.8 oz (74.8 kg)    GENERAL:alert, no distress and comfortable NEURO: alert & oriented x 3 with fluent speech, no focal motor/sensory deficits  LABORATORY DATA:  I have reviewed the data as listed    Component Value Date/Time   NA 139 01/18/2022 1043   NA 141 12/29/2016 1152   K 3.9 01/18/2022 1043   K 4.1 12/29/2016 1152   CL 105 01/18/2022 1043   CL 104 06/28/2012 1010   CO2 30 01/18/2022 1043   CO2 29 12/29/2016 1152   GLUCOSE 133 (H) 01/18/2022 1043   GLUCOSE 191 (H) 12/29/2016 1152   GLUCOSE 127 (H) 06/28/2012 1010   BUN 18 01/18/2022 1043   BUN 15.1 12/29/2016 1152   CREATININE 1.15 01/18/2022 1043   CREATININE 1.1  12/29/2016 1152   CALCIUM 9.4 01/18/2022 1043   CALCIUM 9.7 12/29/2016 1152   PROT 7.9 01/18/2022 1043   PROT 7.4 12/29/2016 1152   PROT 6.8 12/29/2016 1152   ALBUMIN 4.4 01/18/2022 1043   ALBUMIN 3.9 12/29/2016 1152   AST 24 01/18/2022 1043   AST 20 12/29/2016 1152   ALT 33 01/18/2022 1043   ALT 23 12/29/2016 1152   ALKPHOS 64 01/18/2022 1043   ALKPHOS 78 12/29/2016 1152   BILITOT 1.2 01/18/2022 1043   BILITOT 1.19 12/29/2016 1152   GFRNONAA >60 01/18/2022 1043   GFRAA >60 01/14/2019 1100    No results found for: "SPEP", "UPEP"  Lab Results  Component Value Date   WBC 6.5 01/18/2022   NEUTROABS 3.8 01/18/2022   HGB 13.8 01/18/2022   HCT 39.6 01/18/2022   MCV 97.3 01/18/2022   PLT 287 01/18/2022      Chemistry      Component Value Date/Time   NA 139  01/18/2022 1043   NA 141 12/29/2016 1152   K 3.9 01/18/2022 1043   K 4.1 12/29/2016 1152   CL 105 01/18/2022 1043   CL 104 06/28/2012 1010   CO2 30 01/18/2022 1043   CO2 29 12/29/2016 1152   BUN 18 01/18/2022 1043   BUN 15.1 12/29/2016 1152   CREATININE 1.15 01/18/2022 1043   CREATININE 1.1 12/29/2016 1152      Component Value Date/Time   CALCIUM 9.4 01/18/2022 1043   CALCIUM 9.7 12/29/2016 1152   ALKPHOS 64 01/18/2022 1043   ALKPHOS 78 12/29/2016 1152   AST 24 01/18/2022 1043   AST 20 12/29/2016 1152   ALT 33 01/18/2022 1043   ALT 23 12/29/2016 1152   BILITOT 1.2 01/18/2022 1043   BILITOT 1.19 12/29/2016 1152

## 2022-02-14 ENCOUNTER — Ambulatory Visit (INDEPENDENT_AMBULATORY_CARE_PROVIDER_SITE_OTHER): Payer: Medicare HMO

## 2022-02-14 DIAGNOSIS — E538 Deficiency of other specified B group vitamins: Secondary | ICD-10-CM | POA: Diagnosis not present

## 2022-02-14 MED ORDER — CYANOCOBALAMIN 1000 MCG/ML IJ SOLN
1000.0000 ug | Freq: Once | INTRAMUSCULAR | Status: AC
  Administered 2022-02-14: 1000 ug via INTRAMUSCULAR

## 2022-02-14 NOTE — Progress Notes (Signed)
Pt here for monthly B12 injection per   B12 1000mcg given IM, and pt tolerated injection well.   

## 2022-03-16 ENCOUNTER — Ambulatory Visit: Payer: Medicare HMO

## 2022-03-17 ENCOUNTER — Ambulatory Visit (INDEPENDENT_AMBULATORY_CARE_PROVIDER_SITE_OTHER): Payer: Medicare HMO

## 2022-03-17 DIAGNOSIS — Z23 Encounter for immunization: Secondary | ICD-10-CM

## 2022-03-17 DIAGNOSIS — E538 Deficiency of other specified B group vitamins: Secondary | ICD-10-CM | POA: Diagnosis not present

## 2022-03-17 MED ORDER — CYANOCOBALAMIN 1000 MCG/ML IJ SOLN
1000.0000 ug | Freq: Once | INTRAMUSCULAR | Status: AC
  Administered 2022-03-17: 1000 ug via INTRAMUSCULAR

## 2022-03-17 NOTE — Progress Notes (Signed)
After obtaining consent, and per orders of Dr. Jenny Reichmann, injection of B12 was given in the left deltoid by Marrian Salvage. Patient instructed to report any adverse reaction to me immediately.

## 2022-04-18 ENCOUNTER — Ambulatory Visit (INDEPENDENT_AMBULATORY_CARE_PROVIDER_SITE_OTHER): Payer: Medicare HMO

## 2022-04-18 ENCOUNTER — Telehealth: Payer: Self-pay

## 2022-04-18 DIAGNOSIS — E538 Deficiency of other specified B group vitamins: Secondary | ICD-10-CM | POA: Diagnosis not present

## 2022-04-18 MED ORDER — CYANOCOBALAMIN 1000 MCG/ML IJ SOLN
1000.0000 ug | Freq: Once | INTRAMUSCULAR | Status: AC
  Administered 2022-04-18: 1000 ug via INTRAMUSCULAR

## 2022-04-18 NOTE — Telephone Encounter (Signed)
Left detail message for pt in regards Dr. Jenny Reichmann advice of medication pt was requesting to know if it ok to take is not, due to not being FDA approved and since of it working

## 2022-04-18 NOTE — Telephone Encounter (Signed)
pt want to know about lose of memory, and is wondering is it ok for him to take medication such as Prevagen

## 2022-04-18 NOTE — Progress Notes (Signed)
After obtaining consent, and per orders of Dr. John, injection of B12 was given by Deondria Puryear [facility] [place]. Patient instructed to report any adverse reaction to me immediately.  

## 2022-04-18 NOTE — Telephone Encounter (Signed)
Unfortunately prevagen is not FDA approved, and not really proven to work, so I would not personally recommend it

## 2022-04-21 NOTE — Telephone Encounter (Signed)
Left detail message for pt in regards Dr. Jenny Reichmann recommendation for the [place]

## 2022-05-19 ENCOUNTER — Ambulatory Visit (INDEPENDENT_AMBULATORY_CARE_PROVIDER_SITE_OTHER): Payer: Medicare HMO

## 2022-05-19 DIAGNOSIS — E538 Deficiency of other specified B group vitamins: Secondary | ICD-10-CM

## 2022-05-19 DIAGNOSIS — Z23 Encounter for immunization: Secondary | ICD-10-CM | POA: Diagnosis not present

## 2022-05-19 MED ORDER — CYANOCOBALAMIN 1000 MCG/ML IJ SOLN
1000.0000 ug | Freq: Once | INTRAMUSCULAR | Status: AC
  Administered 2022-05-19: 1000 ug via INTRAMUSCULAR

## 2022-05-19 NOTE — Progress Notes (Signed)
After obtaining consent, and per orders of Dr. Jenny Reichmann, injection of B12 and High dose flu shot was  given by Marrian Salvage. Patient instructed to report any adverse reaction to me immediately.

## 2022-05-26 ENCOUNTER — Other Ambulatory Visit (INDEPENDENT_AMBULATORY_CARE_PROVIDER_SITE_OTHER): Payer: Medicare HMO

## 2022-05-26 DIAGNOSIS — R7302 Impaired glucose tolerance (oral): Secondary | ICD-10-CM | POA: Diagnosis not present

## 2022-05-26 DIAGNOSIS — E538 Deficiency of other specified B group vitamins: Secondary | ICD-10-CM

## 2022-05-26 DIAGNOSIS — E559 Vitamin D deficiency, unspecified: Secondary | ICD-10-CM

## 2022-05-26 DIAGNOSIS — E7800 Pure hypercholesterolemia, unspecified: Secondary | ICD-10-CM | POA: Diagnosis not present

## 2022-05-26 DIAGNOSIS — Z0001 Encounter for general adult medical examination with abnormal findings: Secondary | ICD-10-CM

## 2022-05-26 LAB — URINALYSIS, ROUTINE W REFLEX MICROSCOPIC
Bilirubin Urine: NEGATIVE
Hgb urine dipstick: NEGATIVE
Ketones, ur: NEGATIVE
Leukocytes,Ua: NEGATIVE
Nitrite: NEGATIVE
RBC / HPF: NONE SEEN (ref 0–?)
Specific Gravity, Urine: 1.02 (ref 1.000–1.030)
Total Protein, Urine: NEGATIVE
Urine Glucose: NEGATIVE
Urobilinogen, UA: 0.2 (ref 0.0–1.0)
pH: 6 (ref 5.0–8.0)

## 2022-05-26 LAB — BASIC METABOLIC PANEL
BUN: 15 mg/dL (ref 6–23)
CO2: 28 mEq/L (ref 19–32)
Calcium: 9.7 mg/dL (ref 8.4–10.5)
Chloride: 103 mEq/L (ref 96–112)
Creatinine, Ser: 1.09 mg/dL (ref 0.40–1.50)
GFR: 67.82 mL/min (ref >60.00)
Glucose, Bld: 106 mg/dL — ABNORMAL HIGH (ref 70–99)
Potassium: 4.2 mEq/L (ref 3.5–5.1)
Sodium: 140 mEq/L (ref 135–145)

## 2022-05-26 LAB — CBC WITH DIFFERENTIAL/PLATELET
Basophils Absolute: 0 10*3/uL (ref 0.0–0.1)
Basophils Relative: 0.3 % (ref 0.0–3.0)
Eosinophils Absolute: 0.1 10*3/uL (ref 0.0–0.7)
Eosinophils Relative: 2.3 % (ref 0.0–5.0)
HCT: 41.3 % (ref 39.0–52.0)
Hemoglobin: 14.1 g/dL (ref 13.0–17.0)
Lymphocytes Relative: 33.7 % (ref 12.0–46.0)
Lymphs Abs: 2 10*3/uL (ref 0.7–4.0)
MCHC: 34.1 g/dL (ref 30.0–36.0)
MCV: 100.9 fl — ABNORMAL HIGH (ref 78.0–100.0)
Monocytes Absolute: 0.5 10*3/uL (ref 0.1–1.0)
Monocytes Relative: 8.3 % (ref 3.0–12.0)
Neutro Abs: 3.3 10*3/uL (ref 1.4–7.7)
Neutrophils Relative %: 55.4 % (ref 43.0–77.0)
Platelets: 276 10*3/uL (ref 150.0–400.0)
RBC: 4.1 Mil/uL — ABNORMAL LOW (ref 4.22–5.81)
RDW: 13.1 % (ref 11.5–15.5)
WBC: 6 10*3/uL (ref 4.0–10.5)

## 2022-05-26 LAB — LIPID PANEL
Cholesterol: 176 mg/dL (ref 0–200)
HDL: 61.2 mg/dL (ref >39.00)
LDL Cholesterol: 98 mg/dL (ref 0–99)
NonHDL: 114.69
Total CHOL/HDL Ratio: 3
Triglycerides: 85 mg/dL (ref 0.0–149.0)
VLDL: 17 mg/dL (ref 0.0–40.0)

## 2022-05-26 LAB — HEPATIC FUNCTION PANEL
ALT: 27 U/L (ref 0–53)
AST: 21 U/L (ref 0–37)
Albumin: 4.3 g/dL (ref 3.5–5.2)
Alkaline Phosphatase: 56 U/L (ref 39–117)
Bilirubin, Direct: 0.3 mg/dL (ref 0.0–0.3)
Total Bilirubin: 1.2 mg/dL (ref 0.2–1.2)
Total Protein: 7.3 g/dL (ref 6.0–8.3)

## 2022-05-26 LAB — TSH: TSH: 2.4 u[IU]/mL (ref 0.35–5.50)

## 2022-05-26 LAB — HEMOGLOBIN A1C: Hgb A1c MFr Bld: 6.3 % (ref 4.6–6.5)

## 2022-05-26 LAB — VITAMIN B12: Vitamin B-12: 388 pg/mL (ref 211–911)

## 2022-05-26 LAB — PSA: PSA: 0.13 ng/mL (ref 0.10–4.00)

## 2022-05-26 LAB — VITAMIN D 25 HYDROXY (VIT D DEFICIENCY, FRACTURES): VITD: 24.61 ng/mL — ABNORMAL LOW (ref 30.00–100.00)

## 2022-05-27 DIAGNOSIS — C61 Malignant neoplasm of prostate: Secondary | ICD-10-CM | POA: Diagnosis not present

## 2022-05-31 ENCOUNTER — Other Ambulatory Visit: Payer: Self-pay | Admitting: Internal Medicine

## 2022-05-31 ENCOUNTER — Ambulatory Visit (INDEPENDENT_AMBULATORY_CARE_PROVIDER_SITE_OTHER): Payer: Medicare HMO | Admitting: Internal Medicine

## 2022-05-31 VITALS — BP 134/80 | HR 66 | Temp 98.2°F | Ht 68.0 in | Wt 165.0 lb

## 2022-05-31 DIAGNOSIS — Z0001 Encounter for general adult medical examination with abnormal findings: Secondary | ICD-10-CM

## 2022-05-31 DIAGNOSIS — E559 Vitamin D deficiency, unspecified: Secondary | ICD-10-CM

## 2022-05-31 DIAGNOSIS — Z23 Encounter for immunization: Secondary | ICD-10-CM | POA: Diagnosis not present

## 2022-05-31 DIAGNOSIS — M79645 Pain in left finger(s): Secondary | ICD-10-CM | POA: Diagnosis not present

## 2022-05-31 DIAGNOSIS — E7800 Pure hypercholesterolemia, unspecified: Secondary | ICD-10-CM | POA: Diagnosis not present

## 2022-05-31 DIAGNOSIS — R7302 Impaired glucose tolerance (oral): Secondary | ICD-10-CM | POA: Diagnosis not present

## 2022-05-31 DIAGNOSIS — I10 Essential (primary) hypertension: Secondary | ICD-10-CM | POA: Diagnosis not present

## 2022-05-31 DIAGNOSIS — G8929 Other chronic pain: Secondary | ICD-10-CM

## 2022-05-31 DIAGNOSIS — E538 Deficiency of other specified B group vitamins: Secondary | ICD-10-CM

## 2022-05-31 DIAGNOSIS — E611 Iron deficiency: Secondary | ICD-10-CM

## 2022-05-31 NOTE — Progress Notes (Signed)
Patient ID: Nathan Wallace, male   DOB: [birth year], 72 y.o.   MRN: [id number]         Chief Complaint:: wellness exam and Physical (Injury to finger doing heavy lifting)  , low vit d, low Vit B12, htn, hld, hyperglycemia       HPI:  Nathan Wallace is a 72 y.o. male here for wellness exam; for tdap and covid booster at pharmacy, for shingrix #2 toDAY,, o/w up to date                        Also Pt denies chest pain, increased sob or doe, wheezing, orthopnea, PND, increased LE swelling, palpitations, dizziness or syncope.   Pt denies polydipsia, polyuria, or new focal neuro s/s.    Pt denies fever, wt loss, night sweats, loss of appetite, or other constitutional symptoms  Also with left CMC pain and swelling worsening over the past 3 mo, now mild to mod, tylneol not working as well.     Wt Readings from Last 3 Encounters:  05/31/22 165 lb (74.8 kg)  01/25/22 164 lb 12.8 oz (74.8 kg)  12/24/21 178 lb (80.7 kg)   BP Readings from Last 3 Encounters:  05/31/22 134/80  01/25/22 138/83  11/14/21 (!) 183/97   Immunization History  Administered Date(s) Administered   Fluad Quad(high Dose 65+) 12/06/2018, 01/06/2020, 01/21/2021, 05/19/2022   Influenza Split 12/19/2011   Influenza, High Dose Seasonal PF 12/25/2015, 02/02/2017, 01/05/2018   Influenza,inj,Quad PF,6+ Mos 02/28/2013, 04/09/2014, 01/05/2015   PFIZER Comirnaty(Gray Top)Covid-19 Tri-Sucrose Vaccine 07/30/2020   PFIZER(Purple Top)SARS-COV-2 Vaccination 05/24/2019, 06/18/2019, 01/21/2020   Pfizer Covid-19 Vaccine Bivalent Booster 69yr & up 03/03/2021   Pneumococcal Conjugate-13 05/18/2017   Pneumococcal Polysaccharide-23 05/22/2018   Td 08/26/2008   Tdap 02/23/2012   Unspecified SARS-COV-2 Vaccination 02/02/2022   Zoster Recombinat (Shingrix) 12/15/2021, 05/31/2022   Zoster, Live 07/26/2012   Health Maintenance Due  Topic Date Due   DTaP/Tdap/Td (3 - Td or Tdap) 02/22/2022      Past Medical History:  Diagnosis Date   [place] [place]    Arthritis    RT KNEE AND RT SHOULDER   B12 deficiency    Benign head tremor    Chest tightness    comes and goes    CVA (cerebrovascular accident due to intracerebral hemorrhage) ([facility] 08/03/2011   Degenerative joint disease of knee, right    Depression 07/26/2012   no problems now   Diverticulosis    GERD (gastroesophageal reflux disease)    NO RECENT PROBLEMS AND NO MEDS   History of blood transfusion    History of kidney stones    History of urinary tract infection    Hyperlipidemia    border line   Hypertension    Impaired glucose tolerance 06/22/2011   Left inguinal hernia    Macrocytic anemia    MGUS (monoclonal gammopathy of unknown significance)    Prostate cancer ([facility] 2011   Rotator cuff tear arthropathy of right shoulder, with instability 10/25/2016   Sepsis ([facility]    Stress incontinence    Stroke ([facility]    2013   Syncope 2012   after prostate surgery; neg neurology work up.    Past Surgical History:  Procedure Laterality Date   COLONOSCOPY     INGUINAL HERNIA REPAIR  06/01/2011   Procedure: HERNIA REPAIR INGUINAL ADULT;  Surgeon: HAdin Hector MD;  Location: [facility];  Service: General;  Laterality: Left;  Left  Inguinal Hernia Repair with Mesh   ROBOT ASSISTED LAPAROSCOPIC RADICAL PROSTATECTOMY  jan 2012   da vinci   SPINE SURGERY  2003 OR 2004   CERVICAL FUSION-STATES LIMITED ROM NECK   TOTAL SHOULDER ARTHROPLASTY Right 10/25/2016   TOTAL SHOULDER ARTHROPLASTY Right 10/25/2016   Procedure: RIGHT TOTAL SHOULDER ARTHROPLASTY;  Surgeon: Marchia Bond, MD;  Location: [facility];  Service: Orthopedics;  Laterality: Right;   URETHRAL SLING N/A 03/08/2016   Procedure: MALE SLING CYSTOSCOPY;  Surgeon: Bjorn Loser, MD;  Location: [facility];  Service: Urology;  Laterality: N/A;   WRIST SURGERY     right     reports that he quit smoking about 55 years ago. His smoking use included cigarettes. He has a 0.50 pack-year smoking history. He has  never used smokeless tobacco. He reports current alcohol use. He reports that he does not currently use drugs after having used the following drugs: Marijuana. family history includes Aneurysm in his mother; Colon cancer (age of onset: 49) in his father; Heart attack in his brother; Hypertension in his mother. Allergies  Allergen Reactions   No Known Allergies    Nsaids Other (See Comments)    Gastric ulcer   Current Outpatient Medications on File Prior to Visit  Medication Sig Dispense Refill   amLODipine (NORVASC) 5 MG tablet Take 1 tablet (5 mg total) by mouth daily. 90 tablet 3   amoxicillin-clavulanate (AUGMENTIN) 875-125 MG tablet Take 1 tablet by mouth every 12 (twelve) hours. 14 tablet 0   Ascorbic Acid (VITAMIN C WITH ROSE HIPS) 1000 MG tablet Take 1 tablet (1,000 mg total) by mouth daily. 90 tablet 0   atorvastatin (LIPITOR) 40 MG tablet Take 1 tablet (40 mg total) by mouth daily. 90 tablet 3   Cholecalciferol (VITAMIN D-3) 1000 units CAPS Take 2 capsules (2,000 Units total) by mouth daily. 180 capsule 0   clotrimazole (LOTRIMIN) 1 % external solution Apply topically 2 (two) times daily. Apply 5 drops to the affected ear twice daily for 14 days 30 mL 1   Cyanocobalamin (B-12 COMPLIANCE INJECTION IJ) Inject 1 Dose as directed every 30 (thirty) days.     gabapentin (NEURONTIN) 300 MG capsule TAKE 2 CAPSULES BY MOUTH 3 TIMES A DAY 90 capsule 5   pantoprazole (PROTONIX) 40 MG tablet Take 1 tablet (40 mg total) by mouth daily. 90 tablet 3   PRESCRIPTION MEDICATION 1 Syringe by Intracavernosal route every other day as needed (10-30 minutes before you have sex for ED). Matrix (Papaverine '30mg'$  Phentolamine '1mg'$  Alprostadil 8mg per ml injection)  erectile dysfunction injection every other day as needed for sex     tiZANidine (ZANAFLEX) 4 MG tablet TAKE 1 TABLET BY MOUTH 3  TIMES DAILY AS NEEDED FOR  MUSCLE SPASM(S) 270 tablet 1   traMADol (ULTRAM) 50 MG tablet Take 1 tablet (50 mg total) by  mouth every 6 (six) hours as needed. 30 tablet 0   Current Facility-Administered Medications on File Prior to Visit  Medication Dose Route Frequency Provider Last Rate Last Admin   cyanocobalamin ((VITAMIN B-12)) injection 1,000 mcg  1,000 mcg Intramuscular Q30 days JBiagio Borg MD   1,000 mcg at 10/13/21 0835        ROS:  All others reviewed and negative.  Objective        PE:  BP 134/80 (BP Location: Right Arm, Patient Position: Sitting, Cuff Size: Large)   Pulse 66   Temp 98.2 F (36.8 C) (Oral)   Ht '5\' 8"'$  (  1.727 m)   Wt 165 lb (74.8 kg)   SpO2 98%   BMI 25.09 kg/m                 Constitutional: Pt appears in NAD               HENT: Head: NCAT.                Right Ear: External ear normal.                 Left Ear: External ear normal.                Eyes: . Pupils are equal, round, and reactive to light. Conjunctivae and EOM are normal               Nose: without d/c or deformity               Neck: Neck supple. Gross normal ROM               Cardiovascular: Normal rate and regular rhythm.                 Pulmonary/Chest: Effort normal and breath sounds without rales or wheezing.                Abd:  Soft, NT, ND, + BS, no organomegaly               Neurological: Pt is alert. At baseline orientation, motor grossly intact; left CMC joint with bony degenerative change and tenderness mild               Skin: Skin is warm. No rashes, no other new lesions, LE edema - none               Psychiatric: Pt behavior is normal without agitation   Micro: none  Cardiac tracings I have personally interpreted today:  none  Pertinent Radiological findings (summarize): none   Lab Results  Component Value Date   WBC 6.0 05/26/2022   HGB 14.1 05/26/2022   HCT 41.3 05/26/2022   PLT 276.0 05/26/2022   GLUCOSE 106 (H) 05/26/2022   CHOL 176 05/26/2022   TRIG 85.0 05/26/2022   HDL 61.20 05/26/2022   LDLDIRECT 87.9 12/19/2011   LDLCALC 98 05/26/2022   ALT 27 05/26/2022   AST 21  05/26/2022   NA 140 05/26/2022   K 4.2 05/26/2022   CL 103 05/26/2022   CREATININE 1.09 05/26/2022   BUN 15 05/26/2022   CO2 28 05/26/2022   TSH 2.40 05/26/2022   PSA 0.13 05/26/2022   INR 1.12 03/02/2016   HGBA1C 6.3 05/26/2022   Assessment/Plan:  Nathan Wallace is a 72 y.o. Black or African American [2] male with  has a past medical history of AKI (acute kidney injury) ([place]), Arthritis, B12 deficiency, Benign head tremor, Chest tightness, CVA (cerebrovascular accident due to intracerebral hemorrhage) ([place]) (08/03/2011), Degenerative joint disease of knee, right, Depression (07/26/2012), Diverticulosis, GERD (gastroesophageal reflux disease), History of blood transfusion, History of kidney stones, History of urinary tract infection, Hyperlipidemia, Hypertension, Impaired glucose tolerance (06/22/2011), Left inguinal hernia, Macrocytic anemia, MGUS (monoclonal gammopathy of unknown significance), Prostate cancer ([place]) (2011), Rotator cuff tear arthropathy of right shoulder, with instability (10/25/2016), Sepsis ([place]), Stress incontinence, Stroke ([place]), and Syncope (2012).  Encounter for well adult exam with abnormal findings Age and sex appropriate education and counseling updated with regular exercise and diet Referrals for preventative services - none needed  Immunizations addressed - for tdap and covid booster at pharmacy, for shingrix #2 Smoking counseling  - none needed Evidence for depression or other mood disorder - none significant Most recent labs reviewed. I have personally reviewed and have noted: 1) the patient's medical and social history 2) The patient's current medications and supplements 3) The patient's height, weight, and BMI have been recorded in the chart   B12 deficiency Lab Results  Component Value Date   VITAMINB12 388 05/26/2022   Stable, cont oral replacement - b12 1000 mcg qd  HTN (hypertension) BP Readings from Last 3 Encounters:  05/31/22 134/80  01/25/22  138/83  11/14/21 (!) 183/97   Stable, pt to continue medical treatment norvasc 5 mg qd   Hyperlipidemia Lab Results  Component Value Date   LDLCALC 98 05/26/2022   Stable, pt to continue current statin lkpitor 40 mg qd   Impaired glucose tolerance Lab Results  Component Value Date   HGBA1C 6.3 05/26/2022   Stable, pt to continue current medical treatment  - diet wt control   Iron deficiency Also for iron lab  Vitamin D deficiency Last vitamin D Lab Results  Component Value Date   VD25OH 24.61 (L) 05/26/2022   Low, to start oral replacement   Chronic pain of left thumb C/w left cmc DJD - for otc volt gel prn, also refer hand sugury for further eval and tx  Followup: Return in about 1 year (around 06/01/2023).  Cathlean Cower, MD 06/04/2022 9:35 PM [facility]

## 2022-05-31 NOTE — Telephone Encounter (Signed)
Please refill as per office routine med refill policy (all routine meds to be refilled for 3 mo or monthly (per pt preference) up to one year from last visit, then month to month grace period for 3 mo, then further med refills will have to be denied) ? ?

## 2022-05-31 NOTE — Patient Instructions (Addendum)
You had the Shingles shot #2 today  Ok to double up on the Vit D3   Please continue all other medications as before, and refills have been done if requested.  Please have the pharmacy call with any other refills you may need.  Please continue your efforts at being more active, low cholesterol diet, and weight control.  You are otherwise up to date with prevention measures today.  Please keep your appointments with your specialists as you may have planned  You will be contacted regarding the referral for: Hand surgury  Please make an Appointment to return for your 1 year visit, or sooner if needed, with Lab testing by Appointment as well, to be done about 3-5 days before at the [facility] (so this is for TWO appointments - please see the scheduling desk as you leave)

## 2022-06-03 DIAGNOSIS — C61 Malignant neoplasm of prostate: Secondary | ICD-10-CM | POA: Diagnosis not present

## 2022-06-03 DIAGNOSIS — N5201 Erectile dysfunction due to arterial insufficiency: Secondary | ICD-10-CM | POA: Diagnosis not present

## 2022-06-03 DIAGNOSIS — N3946 Mixed incontinence: Secondary | ICD-10-CM | POA: Diagnosis not present

## 2022-06-04 ENCOUNTER — Encounter: Payer: Self-pay | Admitting: Internal Medicine

## 2022-06-04 DIAGNOSIS — G8929 Other chronic pain: Secondary | ICD-10-CM | POA: Insufficient documentation

## 2022-06-04 NOTE — Assessment & Plan Note (Signed)
Also for iron lab °

## 2022-06-04 NOTE — Assessment & Plan Note (Addendum)
C/w left cmc DJD - for otc volt gel prn, also refer hand sugury for further eval and tx

## 2022-06-04 NOTE — Assessment & Plan Note (Signed)
Lab Results  Component Value Date   LDLCALC 98 05/26/2022   Stable, pt to continue current statin lkpitor 40 mg qd

## 2022-06-04 NOTE — Assessment & Plan Note (Signed)
Last vitamin D Lab Results  Component Value Date   VD25OH 24.61 (L) 05/26/2022   Low, to start oral replacement

## 2022-06-04 NOTE — Assessment & Plan Note (Signed)
BP Readings from Last 3 Encounters:  05/31/22 134/80  01/25/22 138/83  11/14/21 (!) 183/97   Stable, pt to continue medical treatment norvasc 5 mg qd

## 2022-06-04 NOTE — Assessment & Plan Note (Signed)
Lab Results  Component Value Date   VITAMINB12 388 05/26/2022   Stable, cont oral replacement - b12 1000 mcg qd

## 2022-06-04 NOTE — Assessment & Plan Note (Addendum)
Age and sex appropriate education and counseling updated with regular exercise and diet Referrals for preventative services - none needed Immunizations addressed - for tdap and covid booster at pharmacy, for shingrix #2 Smoking counseling  - none needed Evidence for depression or other mood disorder - none significant Most recent labs reviewed. I have personally reviewed and have noted: 1) the patient's medical and social history 2) The patient's current medications and supplements 3) The patient's height, weight, and BMI have been recorded in the chart

## 2022-06-04 NOTE — Assessment & Plan Note (Signed)
Lab Results  Component Value Date   HGBA1C 6.3 05/26/2022   Stable, pt to continue current medical treatment  - diet wt control

## 2022-06-06 ENCOUNTER — Other Ambulatory Visit: Payer: Self-pay | Admitting: Internal Medicine

## 2022-06-06 NOTE — Telephone Encounter (Signed)
Please refill as per office routine med refill policy (all routine meds to be refilled for 3 mo or monthly (per pt preference) up to one year from last visit, then month to month grace period for 3 mo, then further med refills will have to be denied) ? ?

## 2022-06-09 DIAGNOSIS — N5201 Erectile dysfunction due to arterial insufficiency: Secondary | ICD-10-CM | POA: Diagnosis not present

## 2022-06-09 DIAGNOSIS — N3946 Mixed incontinence: Secondary | ICD-10-CM | POA: Diagnosis not present

## 2022-06-14 DIAGNOSIS — N3946 Mixed incontinence: Secondary | ICD-10-CM | POA: Diagnosis not present

## 2022-06-14 DIAGNOSIS — R350 Frequency of micturition: Secondary | ICD-10-CM | POA: Diagnosis not present

## 2022-06-17 ENCOUNTER — Ambulatory Visit (INDEPENDENT_AMBULATORY_CARE_PROVIDER_SITE_OTHER): Payer: Medicare HMO

## 2022-06-17 ENCOUNTER — Other Ambulatory Visit: Payer: Self-pay

## 2022-06-17 DIAGNOSIS — E538 Deficiency of other specified B group vitamins: Secondary | ICD-10-CM | POA: Diagnosis not present

## 2022-06-17 MED ORDER — GABAPENTIN 300 MG PO CAPS: ORAL_CAPSULE | ORAL | 5 refills | Status: DC

## 2022-06-17 MED ORDER — CYANOCOBALAMIN 1000 MCG/ML IJ SOLN
1000.0000 ug | Freq: Once | INTRAMUSCULAR | Status: AC
  Administered 2022-06-17: 1000 ug via INTRAMUSCULAR

## 2022-06-17 NOTE — Progress Notes (Signed)
Pt was given B12 injection with no complications.

## 2022-07-13 DIAGNOSIS — N3946 Mixed incontinence: Secondary | ICD-10-CM | POA: Diagnosis not present

## 2022-07-18 ENCOUNTER — Ambulatory Visit (INDEPENDENT_AMBULATORY_CARE_PROVIDER_SITE_OTHER): Payer: Medicare HMO

## 2022-07-18 DIAGNOSIS — E538 Deficiency of other specified B group vitamins: Secondary | ICD-10-CM

## 2022-07-18 MED ORDER — CYANOCOBALAMIN 1000 MCG/ML IJ SOLN
1000.0000 ug | Freq: Once | INTRAMUSCULAR | Status: AC
  Administered 2022-07-18: 1000 ug via INTRAMUSCULAR

## 2022-07-18 NOTE — Progress Notes (Signed)
After obtaining consent, and per orders of Dr. John, injection of B12 given by Arland Usery P Mry Lamia. Patient instructed to report any adverse reaction to me immediately.  

## 2022-07-22 DIAGNOSIS — R350 Frequency of micturition: Secondary | ICD-10-CM | POA: Diagnosis not present

## 2022-07-22 DIAGNOSIS — N3946 Mixed incontinence: Secondary | ICD-10-CM | POA: Diagnosis not present

## 2022-08-09 ENCOUNTER — Other Ambulatory Visit: Payer: Self-pay | Admitting: Internal Medicine

## 2022-08-17 ENCOUNTER — Ambulatory Visit (INDEPENDENT_AMBULATORY_CARE_PROVIDER_SITE_OTHER): Payer: Medicare HMO

## 2022-08-17 DIAGNOSIS — E538 Deficiency of other specified B group vitamins: Secondary | ICD-10-CM | POA: Diagnosis not present

## 2022-08-17 MED ORDER — CYANOCOBALAMIN 1000 MCG/ML IJ SOLN
1000.0000 ug | Freq: Once | INTRAMUSCULAR | Status: AC
Start: 2022-08-17 — End: 2022-08-17
  Administered 2022-08-17: 1000 ug via INTRAMUSCULAR

## 2022-08-17 NOTE — Progress Notes (Signed)
After obtaining consent, and per orders of Dr. John, injection of B12 given by Maisie Hauser P Tationna Fullard. Patient instructed to report any adverse reaction to me immediately.  

## 2022-08-22 ENCOUNTER — Telehealth: Payer: Self-pay | Admitting: Internal Medicine

## 2022-08-22 ENCOUNTER — Other Ambulatory Visit: Payer: Self-pay

## 2022-08-22 MED ORDER — ATORVASTATIN CALCIUM 40 MG PO TABS: 40.0000 mg | ORAL_TABLET | Freq: Every day | ORAL | 3 refills | Status: DC

## 2022-08-22 NOTE — Telephone Encounter (Signed)
Refill Sent. 

## 2022-08-22 NOTE — Telephone Encounter (Signed)
Prescription Request  08/22/2022  LOV: 05/31/2022  What is the name of the medication or equipment? atorvastatin (LIPITOR) 40 MG tablet   Have you contacted your pharmacy to request a refill? Yes   Which pharmacy would you like this sent to?  [id number] [place], [place] - [address] [place] [address] Phone: [phone] Fax: [phone]  Patient notified that their request is being sent to the clinical staff for review and that they should receive a response within 2 business days.   Please advise at Mobile [phone] (mobile)   We sent an RX for this medication on 5.3.24, but the fax failed and medication has not been filled at pharmacy. Please re-send RX

## 2022-08-29 DIAGNOSIS — N393 Stress incontinence (female) (male): Secondary | ICD-10-CM | POA: Diagnosis not present

## 2022-08-29 DIAGNOSIS — N5201 Erectile dysfunction due to arterial insufficiency: Secondary | ICD-10-CM | POA: Diagnosis not present

## 2022-09-01 ENCOUNTER — Other Ambulatory Visit: Payer: Self-pay | Admitting: Urology

## 2022-09-02 ENCOUNTER — Telehealth: Payer: Self-pay | Admitting: Internal Medicine

## 2022-09-02 NOTE — Telephone Encounter (Signed)
We have received Surgical Clearance pw in the fax, to be filled out by provider. Patient requested to send it via Fax within 7-days. Document is located in providers tray at front office.Please advise at Mobile [phone] (mobile)   Please fax to: [phone]

## 2022-09-06 NOTE — Telephone Encounter (Signed)
Form placed on providers desk 

## 2022-09-07 NOTE — Telephone Encounter (Signed)
Forms faxed

## 2022-09-09 ENCOUNTER — Encounter (HOSPITAL_COMMUNITY): Payer: Self-pay

## 2022-09-19 ENCOUNTER — Ambulatory Visit (INDEPENDENT_AMBULATORY_CARE_PROVIDER_SITE_OTHER): Payer: Medicare HMO | Admitting: *Deleted

## 2022-09-19 ENCOUNTER — Other Ambulatory Visit: Payer: Self-pay | Admitting: Internal Medicine

## 2022-09-19 DIAGNOSIS — E538 Deficiency of other specified B group vitamins: Secondary | ICD-10-CM | POA: Diagnosis not present

## 2022-09-19 MED ORDER — CYANOCOBALAMIN 1000 MCG/ML IJ SOLN
1000.0000 ug | Freq: Once | INTRAMUSCULAR | Status: AC
Start: 2022-09-19 — End: 2022-09-19
  Administered 2022-09-19: 1000 ug via INTRAMUSCULAR

## 2022-09-19 NOTE — Progress Notes (Signed)
Pls cosign for B12 in absence of PCP.Marland KitchenRaechel Chute

## 2022-09-21 NOTE — Patient Instructions (Addendum)
SURGICAL WAITING ROOM VISITATION Patients having surgery or a procedure may have no more than 2 support people in the waiting area - these visitors may rotate.    Children under the age of 28 must have an adult with them who is not the patient.  If the patient needs to stay at the hospital during part of their recovery, the visitor guidelines for inpatient rooms apply. Pre-op nurse will coordinate an appropriate time for 1 support person to accompany patient in pre-op.  This support person may not rotate.    Please refer to the [facility] website for the visitor guidelines for Inpatients (after your surgery is over and you are in a regular room).       Your procedure is scheduled on: 10-04-22   Report to [facility] Main Entrance    Report to admitting at 5:15 AM   Call this number if you have problems the morning of surgery [phone]   Do not eat food or drink liquids :After Midnight.           If you have questions, please contact your surgeon's office.   FOLLOW ANY ADDITIONAL PRE OP INSTRUCTIONS YOU RECEIVED FROM YOUR SURGEON'S OFFICE!!!     Oral Hygiene is also important to reduce your risk of infection.                                    Remember - BRUSH YOUR TEETH THE MORNING OF SURGERY WITH YOUR REGULAR TOOTHPASTE   Do NOT smoke after Midnight   Take these medicines the morning of surgery with A SIP OF WATER:   Allopurinol  Amlodipine  Atorvastatin  Gabapentin  Pantoprazole                              You may not have any metal on your body including  jewelry, and body piercing             Do not wear  lotions, powders, cologne, or deodorant              Men may shave face and neck.   Do not bring valuables to the hospital. [facility] IS NOT RESPONSIBLE   FOR VALUABLES.   Contacts, dentures or bridgework may not be worn into surgery.   Bring small overnight bag day of surgery.   DO NOT BRING YOUR HOME MEDICATIONS TO THE HOSPITAL. PHARMACY WILL  DISPENSE MEDICATIONS LISTED ON YOUR MEDICATION LIST TO YOU DURING YOUR ADMISSION IN THE HOSPITAL!               Please read over the following fact sheets you were given: IF YOU HAVE QUESTIONS ABOUT YOUR PRE-OP INSTRUCTIONS PLEASE CALL [phone] [facility]  If you received a COVID test during your pre-op visit  it is requested that you wear a mask when out in public, stay away from anyone that may not be feeling well and notify your surgeon if you develop symptoms. If you test positive for Covid or have been in contact with anyone that has tested positive in the last 10 days please notify you surgeon.  [facility] - Preparing for Surgery Before surgery, you can play an important role.  Because skin is not sterile, your skin needs to be as free of germs as possible.  You can reduce the number of germs on your  skin by washing with CHG (chlorahexidine gluconate) soap before surgery.  CHG is an antiseptic cleaner which kills germs and bonds with the skin to continue killing germs even after washing. Please DO NOT use if you have an allergy to CHG or antibacterial soaps.  If your skin becomes reddened/irritated stop using the CHG and inform your nurse when you arrive at Short Stay. Do not shave (including legs and underarms) for at least 48 hours prior to the first CHG shower.  You may shave your face/neck.  Please follow these instructions carefully:  1.  Shower with CHG Soap the night before surgery and the  morning of surgery.  2.  If you choose to wash your hair, wash your hair first as usual with your normal  shampoo.  3.  After you shampoo, rinse your hair and body thoroughly to remove the shampoo.                             4.  Use CHG as you would any other liquid soap.  You can apply chg directly to the skin and wash.  Gently with a scrungie or clean washcloth.  5.  Apply the CHG Soap to your body ONLY FROM THE NECK DOWN.   Do   not use on face/ open                           Wound or open sores.  Avoid contact with eyes, ears mouth and   genitals (private parts).                       Wash face,  Genitals (private parts) with your normal soap.             6.  Wash thoroughly, paying special attention to the area where your    surgery  will be performed.  7.  Thoroughly rinse your body with warm water from the neck down.  8.  DO NOT shower/wash with your normal soap after using and rinsing off the CHG Soap.                9.  Pat yourself dry with a clean towel.            10.  Wear clean pajamas.            11.  Place clean sheets on your bed the night of your first shower and do not  sleep with pets. Day of Surgery : Do not apply any lotions/deodorants the morning of surgery.  Please wear clean clothes to the hospital/surgery center.  FAILURE TO FOLLOW THESE INSTRUCTIONS MAY RESULT IN THE CANCELLATION OF YOUR SURGERY  PATIENT SIGNATURE_________________________________  NURSE SIGNATURE__________________________________  ________________________________________________________________________

## 2022-09-21 NOTE — Progress Notes (Addendum)
COVID Vaccine Completed:  Yes  Date of COVID positive in last 67 days:N/A  PCP - Oliver Barre, MD (office not requested) Cardiologist - N/A  Medical clearance on chart  Chest x-ray - N/A EKG - 09-26-22 Epic Stress Test - N/A ECHO - 06-28-11  Cardiac Cath - N/A Pacemaker/ICD device last checked: Spinal Cord Stimulator:N/A  Bowel Prep - N/A  Sleep Study - N/A CPAP -   Fasting Blood Sugar - N/A Checks Blood Sugar _____ times a day  Last dose of GLP1 agonist-  N/A GLP1 instructions:  N/A   Last dose of SGLT-2 inhibitors-  N/A SGLT-2 instructions: N/A  Blood Thinner Instructions:  N/A Aspirin Instructions: Last Dose:  Activity level:  Can go up a flight of stairs and perform activities of daily living without stopping and without symptoms of chest pain or shortness of breath.  Able to exercise without symptoms  Anesthesia review:  BP elevated at PAT 167/108 and on recheck 167/93.  Patient denies headache, chest pain or shortness of breath.  States that he checks at home and it is usually in the 120s/80s range.   He will recheck when he gets home and notify PCP if it remains elevated.   Patient denies shortness of breath, fever, cough and chest pain at PAT appointment  Patient verbalized understanding of instructions that were given to them at the PAT appointment. Patient was also instructed that they will need to review over the PAT instructions again at home before surgery.

## 2022-09-26 ENCOUNTER — Other Ambulatory Visit: Payer: Self-pay

## 2022-09-26 ENCOUNTER — Encounter (HOSPITAL_COMMUNITY)
Admission: RE | Admit: 2022-09-26 | Discharge: 2022-09-26 | Disposition: A | Discharge status: Home / Self Care | Payer: Medicare HMO | Source: Ambulatory Visit | Attending: Urology | Admitting: Urology

## 2022-09-26 ENCOUNTER — Encounter (HOSPITAL_COMMUNITY): Payer: Self-pay

## 2022-09-26 VITALS — BP 167/93 | HR 59 | Temp 98.3°F | Resp 16 | Ht 71.0 in | Wt 163.0 lb

## 2022-09-26 DIAGNOSIS — R001 Bradycardia, unspecified: Secondary | ICD-10-CM | POA: Insufficient documentation

## 2022-09-26 DIAGNOSIS — Z01818 Encounter for other preprocedural examination: Secondary | ICD-10-CM | POA: Diagnosis not present

## 2022-09-26 DIAGNOSIS — I440 Atrioventricular block, first degree: Secondary | ICD-10-CM | POA: Diagnosis not present

## 2022-09-26 DIAGNOSIS — I10 Essential (primary) hypertension: Secondary | ICD-10-CM | POA: Diagnosis not present

## 2022-09-26 LAB — CBC
HCT: 40.8 % (ref 39.0–52.0)
Hemoglobin: 13.6 g/dL (ref 13.0–17.0)
MCH: 33.8 pg (ref 26.0–34.0)
MCHC: 33.3 g/dL (ref 30.0–36.0)
MCV: 101.5 fL — ABNORMAL HIGH (ref 80.0–100.0)
Platelets: 227 10*3/uL (ref 150–400)
RBC: 4.02 MIL/uL — ABNORMAL LOW (ref 4.22–5.81)
RDW: 12.7 % (ref 11.5–15.5)
WBC: 5.9 10*3/uL (ref 4.0–10.5)
nRBC: 0 % (ref 0.0–0.2)

## 2022-09-26 LAB — BASIC METABOLIC PANEL
Anion gap: 8 (ref 5–15)
BUN: 16 mg/dL (ref 8–23)
CO2: 25 mmol/L (ref 22–32)
Calcium: 8.7 mg/dL — ABNORMAL LOW (ref 8.9–10.3)
Chloride: 106 mmol/L (ref 98–111)
Creatinine, Ser: 1.13 mg/dL (ref 0.61–1.24)
GFR, Estimated: >60 mL/min (ref >60)
Glucose, Bld: 116 mg/dL — ABNORMAL HIGH (ref 70–99)
Potassium: 3.4 mmol/L — ABNORMAL LOW (ref 3.5–5.1)
Sodium: 139 mmol/L (ref 135–145)

## 2022-09-27 LAB — URINE CULTURE: Culture: <10000 — AB

## 2022-09-27 NOTE — Progress Notes (Signed)
Urine culture results sent to Dr. Lafonda Mosses.

## 2022-10-03 MED ORDER — GENTAMICIN SULFATE 40 MG/ML IJ SOLN
5.0000 mg/kg | INTRAVENOUS | Status: AC
  Administered 2022-10-04: 370 mg via INTRAVENOUS
  Filled 2022-10-03: qty 9.25

## 2022-10-04 ENCOUNTER — Encounter (HOSPITAL_COMMUNITY): Payer: Self-pay | Admitting: Urology

## 2022-10-04 ENCOUNTER — Other Ambulatory Visit: Payer: Self-pay

## 2022-10-04 ENCOUNTER — Observation Stay (HOSPITAL_COMMUNITY)
Admission: RE | Admit: 2022-10-04 | Discharge: 2022-10-05 | Disposition: A | Discharge status: Home / Self Care | Payer: Medicare HMO | Source: Ambulatory Visit | Attending: Urology | Admitting: Urology

## 2022-10-04 ENCOUNTER — Ambulatory Visit (HOSPITAL_BASED_OUTPATIENT_CLINIC_OR_DEPARTMENT_OTHER): Payer: Medicare HMO | Admitting: Anesthesiology

## 2022-10-04 ENCOUNTER — Ambulatory Visit (HOSPITAL_COMMUNITY): Payer: Medicare HMO | Admitting: Physician Assistant

## 2022-10-04 ENCOUNTER — Encounter (HOSPITAL_COMMUNITY)
Admission: RE | Disposition: A | Discharge status: Home / Self Care | Payer: Self-pay | Source: Ambulatory Visit | Attending: Urology

## 2022-10-04 DIAGNOSIS — I10 Essential (primary) hypertension: Secondary | ICD-10-CM

## 2022-10-04 DIAGNOSIS — Z79899 Other long term (current) drug therapy: Secondary | ICD-10-CM | POA: Insufficient documentation

## 2022-10-04 DIAGNOSIS — N393 Stress incontinence (female) (male): Secondary | ICD-10-CM | POA: Diagnosis not present

## 2022-10-04 DIAGNOSIS — Z8546 Personal history of malignant neoplasm of prostate: Secondary | ICD-10-CM | POA: Diagnosis not present

## 2022-10-04 DIAGNOSIS — Z87891 Personal history of nicotine dependence: Secondary | ICD-10-CM | POA: Insufficient documentation

## 2022-10-04 DIAGNOSIS — Z96611 Presence of right artificial shoulder joint: Secondary | ICD-10-CM | POA: Diagnosis not present

## 2022-10-04 DIAGNOSIS — Z8673 Personal history of transient ischemic attack (TIA), and cerebral infarction without residual deficits: Secondary | ICD-10-CM | POA: Diagnosis not present

## 2022-10-04 DIAGNOSIS — Z960 Presence of urogenital implants: Secondary | ICD-10-CM

## 2022-10-04 HISTORY — PX: URINARY SPHINCTER IMPLANT: SHX2624

## 2022-10-04 SURGERY — INSERTION, ARTIFICIAL URINARY SPHINCTER
Anesthesia: General

## 2022-10-04 MED ORDER — ATORVASTATIN CALCIUM 40 MG PO TABS
40.0000 mg | ORAL_TABLET | Freq: Every day | ORAL | Status: DC
  Administered 2022-10-05: 40 mg via ORAL
  Filled 2022-10-04: qty 1

## 2022-10-04 MED ORDER — SODIUM CHLORIDE 0.45 % IV SOLN: INTRAVENOUS | Status: DC

## 2022-10-04 MED ORDER — DEXAMETHASONE SODIUM PHOSPHATE 10 MG/ML IJ SOLN
INTRAMUSCULAR | Status: DC | PRN
  Administered 2022-10-04: 10 mg via INTRAVENOUS

## 2022-10-04 MED ORDER — ACETAMINOPHEN 500 MG PO TABS
1000.0000 mg | ORAL_TABLET | Freq: Four times a day (QID) | ORAL | Status: DC
  Administered 2022-10-04 – 2022-10-05 (×4): 1000 mg via ORAL
  Filled 2022-10-04 (×4): qty 2

## 2022-10-04 MED ORDER — ONDANSETRON HCL 4 MG/2ML IJ SOLN
INTRAMUSCULAR | Status: AC
  Filled 2022-10-04: qty 2

## 2022-10-04 MED ORDER — GABAPENTIN 300 MG PO CAPS
600.0000 mg | ORAL_CAPSULE | Freq: Two times a day (BID) | ORAL | Status: DC
  Administered 2022-10-04 – 2022-10-05 (×2): 600 mg via ORAL
  Filled 2022-10-04 (×2): qty 2

## 2022-10-04 MED ORDER — LIDOCAINE HCL 1 % IJ SOLN
INTRAMUSCULAR | Status: DC | PRN
  Administered 2022-10-04: 9 mL

## 2022-10-04 MED ORDER — LIDOCAINE HCL 1 % IJ SOLN
INTRAMUSCULAR | Status: AC
  Filled 2022-10-04: qty 20

## 2022-10-04 MED ORDER — CHLORHEXIDINE GLUCONATE 4 % EX SOLN: Freq: Once | CUTANEOUS | Status: DC

## 2022-10-04 MED ORDER — AMISULPRIDE (ANTIEMETIC) 5 MG/2ML IV SOLN: 10.0000 mg | Freq: Once | INTRAVENOUS | Status: DC | PRN

## 2022-10-04 MED ORDER — EPHEDRINE 5 MG/ML INJ
INTRAVENOUS | Status: AC
  Filled 2022-10-04: qty 5

## 2022-10-04 MED ORDER — VANCOMYCIN HCL IN DEXTROSE 1-5 GM/200ML-% IV SOLN
1000.0000 mg | INTRAVENOUS | Status: AC
  Administered 2022-10-04: 1000 mg via INTRAVENOUS
  Filled 2022-10-04: qty 200

## 2022-10-04 MED ORDER — BUPIVACAINE HCL (PF) 0.5 % IJ SOLN
INTRAMUSCULAR | Status: DC | PRN
  Administered 2022-10-04: 9 mL

## 2022-10-04 MED ORDER — HYDROMORPHONE HCL 1 MG/ML IJ SOLN
0.2500 mg | INTRAMUSCULAR | Status: DC | PRN
  Administered 2022-10-04 (×2): 0.5 mg via INTRAVENOUS

## 2022-10-04 MED ORDER — FLUCONAZOLE IN SODIUM CHLORIDE 200-0.9 MG/100ML-% IV SOLN
200.0000 mg | INTRAVENOUS | Status: DC
  Administered 2022-10-04: 200 mg via INTRAVENOUS
  Filled 2022-10-04: qty 100

## 2022-10-04 MED ORDER — OXYCODONE HCL 5 MG/5ML PO SOLN: 5.0000 mg | Freq: Once | ORAL | Status: DC | PRN

## 2022-10-04 MED ORDER — PANTOPRAZOLE SODIUM 40 MG PO TBEC
40.0000 mg | DELAYED_RELEASE_TABLET | Freq: Every day | ORAL | Status: DC
  Administered 2022-10-05: 40 mg via ORAL
  Filled 2022-10-04: qty 1

## 2022-10-04 MED ORDER — HYDROMORPHONE HCL 1 MG/ML IJ SOLN
INTRAMUSCULAR | Status: AC
  Filled 2022-10-04: qty 1

## 2022-10-04 MED ORDER — ONDANSETRON HCL 4 MG/2ML IJ SOLN: 4.0000 mg | Freq: Once | INTRAMUSCULAR | Status: DC | PRN

## 2022-10-04 MED ORDER — OXYCODONE HCL 5 MG PO TABS
5.0000 mg | ORAL_TABLET | ORAL | Status: DC | PRN
  Administered 2022-10-04: 5 mg via ORAL
  Filled 2022-10-04: qty 1

## 2022-10-04 MED ORDER — OXYCODONE HCL 5 MG PO TABS: 5.0000 mg | ORAL_TABLET | Freq: Once | ORAL | Status: DC | PRN

## 2022-10-04 MED ORDER — HYDROMORPHONE HCL 1 MG/ML IJ SOLN
0.5000 mg | INTRAMUSCULAR | Status: DC | PRN
  Administered 2022-10-04: 0.5 mg via INTRAVENOUS
  Filled 2022-10-04: qty 1

## 2022-10-04 MED ORDER — CHLORHEXIDINE GLUCONATE 0.12 % MT SOLN
15.0000 mL | Freq: Once | OROMUCOSAL | Status: AC
  Administered 2022-10-04: 15 mL via OROMUCOSAL

## 2022-10-04 MED ORDER — HYDROMORPHONE HCL 1 MG/ML IJ SOLN: 0.2500 mg | INTRAMUSCULAR | Status: DC | PRN

## 2022-10-04 MED ORDER — MUPIROCIN 2 % EX OINT
1.0000 | TOPICAL_OINTMENT | Freq: Once | CUTANEOUS | Status: AC
  Administered 2022-10-04: 1 via NASAL
  Filled 2022-10-04: qty 22

## 2022-10-04 MED ORDER — VANCOMYCIN HCL IN DEXTROSE 1-5 GM/200ML-% IV SOLN: 1000.0000 mg | Freq: Once | INTRAVENOUS | Status: DC

## 2022-10-04 MED ORDER — ALLOPURINOL 300 MG PO TABS
300.0000 mg | ORAL_TABLET | Freq: Every day | ORAL | Status: DC
  Administered 2022-10-05: 300 mg via ORAL
  Filled 2022-10-04: qty 1

## 2022-10-04 MED ORDER — FENTANYL CITRATE (PF) 100 MCG/2ML IJ SOLN
INTRAMUSCULAR | Status: AC
  Filled 2022-10-04: qty 2

## 2022-10-04 MED ORDER — AMLODIPINE BESYLATE 10 MG PO TABS
5.0000 mg | ORAL_TABLET | Freq: Every day | ORAL | Status: DC
  Administered 2022-10-05: 5 mg via ORAL
  Filled 2022-10-04: qty 1

## 2022-10-04 MED ORDER — ORAL CARE MOUTH RINSE: 15.0000 mL | Freq: Once | OROMUCOSAL | Status: AC

## 2022-10-04 MED ORDER — FENTANYL CITRATE (PF) 100 MCG/2ML IJ SOLN
INTRAMUSCULAR | Status: DC | PRN
  Administered 2022-10-04 (×2): 25 ug via INTRAVENOUS
  Administered 2022-10-04: 50 ug via INTRAVENOUS

## 2022-10-04 MED ORDER — ONDANSETRON HCL 4 MG/2ML IJ SOLN
INTRAMUSCULAR | Status: DC | PRN
  Administered 2022-10-04: 4 mg via INTRAVENOUS

## 2022-10-04 MED ORDER — STERILE WATER FOR IRRIGATION IR SOLN
Status: DC | PRN
  Administered 2022-10-04: 1000 mL

## 2022-10-04 MED ORDER — SODIUM CHLORIDE 0.9 % IR SOLN
Status: DC | PRN
  Administered 2022-10-04: 1000 mL via INTRAVESICAL

## 2022-10-04 MED ORDER — LACTATED RINGERS IV SOLN: INTRAVENOUS | Status: DC

## 2022-10-04 MED ORDER — ACETAMINOPHEN 500 MG PO TABS
1000.0000 mg | ORAL_TABLET | Freq: Once | ORAL | Status: AC
  Administered 2022-10-04: 1000 mg via ORAL
  Filled 2022-10-04: qty 2

## 2022-10-04 MED ORDER — EPHEDRINE SULFATE-NACL 50-0.9 MG/10ML-% IV SOSY
PREFILLED_SYRINGE | INTRAVENOUS | Status: DC | PRN
  Administered 2022-10-04 (×2): 5 mg via INTRAVENOUS

## 2022-10-04 MED ORDER — LIDOCAINE 2% (20 MG/ML) 5 ML SYRINGE
INTRAMUSCULAR | Status: DC | PRN
  Administered 2022-10-04: 60 mg via INTRAVENOUS

## 2022-10-04 MED ORDER — IRRISEPT - 450ML BOTTLE WITH 0.05% CHG IN STERILE WATER, USP 99.95% OPTIME
TOPICAL | Status: DC | PRN
  Administered 2022-10-04: 450 mL

## 2022-10-04 MED ORDER — LIDOCAINE HCL (PF) 2 % IJ SOLN
INTRAMUSCULAR | Status: AC
  Filled 2022-10-04: qty 5

## 2022-10-04 MED ORDER — 0.9 % SODIUM CHLORIDE (POUR BTL) OPTIME
TOPICAL | Status: DC | PRN
  Administered 2022-10-04: 1000 mL

## 2022-10-04 MED ORDER — BUPIVACAINE HCL (PF) 0.5 % IJ SOLN
INTRAMUSCULAR | Status: AC
  Filled 2022-10-04: qty 30

## 2022-10-04 MED ORDER — PROPOFOL 10 MG/ML IV BOLUS
INTRAVENOUS | Status: DC | PRN
  Administered 2022-10-04: 50 mg via INTRAVENOUS
  Administered 2022-10-04: 150 mg via INTRAVENOUS

## 2022-10-04 MED ORDER — DEXMEDETOMIDINE HCL IN NACL 80 MCG/20ML IV SOLN
INTRAVENOUS | Status: AC
  Filled 2022-10-04: qty 20

## 2022-10-04 MED ORDER — PROPOFOL 10 MG/ML IV BOLUS
INTRAVENOUS | Status: AC
  Filled 2022-10-04: qty 20

## 2022-10-04 MED ORDER — DEXMEDETOMIDINE HCL IN NACL 80 MCG/20ML IV SOLN
INTRAVENOUS | Status: DC | PRN
  Administered 2022-10-04 (×3): 4 ug via INTRAVENOUS

## 2022-10-04 MED ORDER — ONDANSETRON HCL 4 MG/2ML IJ SOLN: 4.0000 mg | INTRAMUSCULAR | Status: DC | PRN

## 2022-10-04 SURGICAL SUPPLY — 67 items
ADH SKN CLS APL DERMABOND .7 (GAUZE/BANDAGES/DRESSINGS) ×2
APL PRP STRL LF DISP 70% ISPRP (MISCELLANEOUS) ×2
BAG DECANTER FOR FLEXI CONT (MISCELLANEOUS) ×1 IMPLANT
BAG DRN RND TRDRP ANRFLXCHMBR (UROLOGICAL SUPPLIES) ×1
BAG URINE DRAIN 2000ML AR STRL (UROLOGICAL SUPPLIES) IMPLANT
BALLOON PRESSURE REGUL 61 70CM (Miscellaneous) IMPLANT
BLADE SURG 15 STRL LF DISP TIS (BLADE) ×2 IMPLANT
BLADE SURG 15 STRL SS (BLADE) ×2
BNDG GAUZE DERMACEA FLUFF 4 (GAUZE/BANDAGES/DRESSINGS) ×1 IMPLANT
BNDG GZE DERMACEA 4 6PLY (GAUZE/BANDAGES/DRESSINGS) ×1
BRIEF MESH DISP LRG (UNDERPADS AND DIAPERS) ×1 IMPLANT
CATH FOLEY 2WAY SLVR 5CC 14FR (CATHETERS) ×1 IMPLANT
CHLORAPREP W/TINT 26 (MISCELLANEOUS) ×2 IMPLANT
CONTROL PUMP (Urological Implant) IMPLANT
COVER BACK TABLE 60X90IN (DRAPES) ×1 IMPLANT
COVER MAYO STAND STRL (DRAPES) ×1 IMPLANT
CUFF URINARY OCCL 4. IZ (Miscellaneous) IMPLANT
DERMABOND ADVANCED .7 DNX12 (GAUZE/BANDAGES/DRESSINGS) ×2 IMPLANT
DISSECTOR ROUND CHERRY 3/8 STR (MISCELLANEOUS) IMPLANT
DRAIN CHANNEL 10F 3/8 F FF (DRAIN) IMPLANT
DRAPE SHEET LG 3/4 BI-LAMINATE (DRAPES) IMPLANT
DRAPE UNDERBUTTOCKS STRL (DISPOSABLE) ×1 IMPLANT
DRSG TEGADERM 4X4.75 (GAUZE/BANDAGES/DRESSINGS) IMPLANT
ELECT REM PT RETURN 15FT ADLT (MISCELLANEOUS) ×1 IMPLANT
EVACUATOR SILICONE 100CC (DRAIN) IMPLANT
GAUZE 4X4 16PLY ~~LOC~~+RFID DBL (SPONGE) ×2 IMPLANT
GLOVE BIO SURGEON STRL SZ 6.5 (GLOVE) IMPLANT
GLOVE BIO SURGEON STRL SZ7 (GLOVE) ×1 IMPLANT
GLOVE BIOGEL PI IND STRL 7.0 (GLOVE) ×1 IMPLANT
GOWN SRG XL LVL 4 BRTHBL STRL (GOWNS) IMPLANT
GOWN STRL NON-REIN XL LVL4 (GOWNS)
GOWN STRL REUS W/ TWL XL LVL3 (GOWN DISPOSABLE) ×1 IMPLANT
GOWN STRL REUS W/TWL XL LVL3 (GOWN DISPOSABLE) ×1
JET LAVAGE IRRISEPT WOUND (IRRIGATION / IRRIGATOR) ×2
KIT ACCESSORY AMS 800 IMPLANT
KIT BASIN OR (CUSTOM PROCEDURE TRAY) ×1 IMPLANT
KIT TURNOVER KIT A (KITS) IMPLANT
LAVAGE JET IRRISEPT WOUND (IRRIGATION / IRRIGATOR) ×1 IMPLANT
LOOP VESSEL MAXI BLUE (MISCELLANEOUS) ×1 IMPLANT
NDL HYPO 21X1.5 SAFETY (NEEDLE) IMPLANT
NEEDLE HYPO 21X1.5 SAFETY (NEEDLE) ×1 IMPLANT
PENCIL SMOKE EVACUATOR (MISCELLANEOUS) ×1 IMPLANT
PLUG CATH AND CAP STRL 200 (CATHETERS) ×1 IMPLANT
PRESS REG BALL 61 70CM (Miscellaneous) ×1 IMPLANT
PROTECTOR NERVE ULNAR (MISCELLANEOUS) ×1 IMPLANT
SET IRRIG Y TYPE TUR BLADDER L (SET/KITS/TRAYS/PACK) IMPLANT
SHEET LAVH (DRAPES) ×1 IMPLANT
SPIKE FLUID TRANSFER (MISCELLANEOUS) ×3 IMPLANT
STAPLER VISISTAT 35W (STAPLE) ×1 IMPLANT
SURGILUBE 2OZ TUBE FLIPTOP (MISCELLANEOUS) ×1 IMPLANT
SUT ETHILON 3 0 PS 1 (SUTURE) IMPLANT
SUT MNCRL AB 4-0 PS2 18 (SUTURE) ×2 IMPLANT
SUT SILK 0 FSL (SUTURE) IMPLANT
SUT VIC AB 0 CT1 27 (SUTURE) ×1
SUT VIC AB 0 CT1 27XBRD ANTBC (SUTURE) ×1 IMPLANT
SUT VIC AB 3-0 SH 27 (SUTURE) ×7
SUT VIC AB 3-0 SH 27X BRD (SUTURE) ×4 IMPLANT
SYR 10ML LL (SYRINGE) ×2 IMPLANT
SYR 20ML LL LF (SYRINGE) ×1 IMPLANT
SYR 30ML LL (SYRINGE) ×1 IMPLANT
SYR BULB IRRIG 60ML STRL (SYRINGE) IMPLANT
SYR CONTROL 10ML LL (SYRINGE) IMPLANT
TOWEL GREEN STERILE FF (TOWEL DISPOSABLE) ×1 IMPLANT
TOWEL OR 17X26 10 PK STRL BLUE (TOWEL DISPOSABLE) ×1 IMPLANT
TOWEL OR NON WOVEN STRL DISP B (DISPOSABLE) ×1 IMPLANT
TUBING CONNECTING 10 (TUBING) ×1 IMPLANT
YANKAUER SUCT BULB TIP 10FT TU (MISCELLANEOUS) ×1 IMPLANT

## 2022-10-04 NOTE — Anesthesia Postprocedure Evaluation (Signed)
Anesthesia Post Note  Patient: Nathan Wallace  Procedure(s) Performed: ARTIFICIAL URINARY SPHINCTER     Patient location during evaluation: PACU Anesthesia Type: General Level of consciousness: awake and alert, oriented and patient cooperative Pain management: pain level controlled Vital Signs Assessment: post-procedure vital signs reviewed and stable Respiratory status: spontaneous breathing, nonlabored ventilation and respiratory function stable Cardiovascular status: blood pressure returned to baseline and stable Postop Assessment: no apparent nausea or vomiting Anesthetic complications: no   No notable events documented.  Last Vitals:  Vitals:   10/04/22 0552  BP: (!) 143/89  Pulse: (!) 59  Resp: 16  Temp: 36.7 C  SpO2: 97%    Last Pain:  Vitals:   10/04/22 0552  TempSrc: Oral                 Lannie Fields

## 2022-10-04 NOTE — Op Note (Signed)
Preoperative diagnosis:  1. Stress urinary incontinence 2. History of prostate cancer s/p prostatectomy  Postoperative diagnosis: same  Procedure(s): 1. Insertion of Artificial Urinary Sphincter (AMS 800) 2. Flexible cystoscopy  Surgeon: Dr. Irine Seal  Assistant: Dr Jerald Kief  Anesthesia: General  Complications: none  EBL: 20 cc  Intraoperative findings: cuff size 4 cm, PRB in R subrectus, good urethral coaptation with appropriately sized cuff  Indication: Nathan Wallace is a 72 y.o.yo M who with stress incontinence s/p prostatectomy. He had a sling several years ago which is no longer helping. After an extensive discussion, he is interested in proceeding with an AUS  Description of procedure:  After appropriate consent had been obtained, the patient was brought to the operative suite where anesthesia was induced. The patient was prepped and draped in the usual sterile fashion. Extra care was taken with leg positioning to minimize the risk of compartment syndrome neuropathy and deep vein thrombosis.  Preoperative antibiotics were given. A timeout was done, then an 14 Fr foley was placed.   I an made approximately 5 cm incision in the perineum involving the lower aspect of the scrotum.  I dissected down through soft tissue and mobilized the subcutaneous tissue from the bulbospongiosus midline. The curved cerebellar retractor was placed and used throughout for exposure. I then split the muscle in the midline, exposing the corpus spongiosum. There was scar tissue here, but ultimately I was able to expose the urethra well.  It was easy to see and feel the perineal tendon that was sharply taken down with Metzenbaum scissors.  We carefully dissected the sponge free from the underlying corpus cavernosum. Ultimately it was dissected free circumferentially and a vessel loop was passed through to aid in further dissection. The dissection was continued until just over 1 cm of space  was created behind the urethra. The sized was passed and used to measure. It showed 4 cm, and the corresponding cuff was selected. The cuff was then passed and secured in place.  We then directed our attention to the right lower quadrant. A small incision was made, and we carefully dissected down through the layers of the abdomen. The  fascia was encountered and opened. Blunt dissected was used to create a space for the PRB. It was inserted into this space, and the fascia was closed with 3-0 vicryl. The balloon was inflated with 24 cc.   The pump was then carefully passed down into a dependent position in the scrotum  on the right. It was held in place with a babcock. The tubing was pexed with a 3-0 vicryl.   The tubing on the cuff was then passed into the RLQ incision. Excess tubing was excised, and the components were connected.   We then tested the pump and it seemed to be working properly. It was deactivated.   The RLQ incision was carefully closed in layers, first using 3-0 vicryl, then 4-0 monocryl.  The perineum was then closed.  I was cognizant of the scrotal aspect of the closure with 3-0 Vicryl that also included reapproximation of the bulbospongiosus muscle.  The next layer was deeper subcutaneous suture with 3-0 Vicryl.  A third layer was close to the skin with level with 3-0 Vicryl.  4-0 running mattress sutures used for the perineum.  Dermabond was applied with fluff dressing  The Foley catheter was draining well at the end of the case.  Leg position was good. I was very pleased with the surgery and hopefully it reaches  the patient's treatment goal  Irine Seal MD 10/04/2022, 9:55 AM  [facility]  Pager: [phone]

## 2022-10-04 NOTE — Discharge Instructions (Signed)
Artificial Urinary Sphincter Post Operative Instructions Dr. Luke Machen  You may notice some swelling or black and blue bruising. This is very common, and may increase slightly over the next several days. Typically it will begin to improve 1-2 weeks after surgery You may take a shower 48 hours after surgery. Avoid submerging yourself completely in water (bath tubs, hot tubs, swimming pools, etc) until 1 month after the surgery. To clean the incision, let soapy water gently wash over the area and pat lightly. Use supportive, tight-fitting underwear for the first two weeks after surgery. For example, jock straps, sliding shorts, or briefs Apply ice packs 20 minutes on/20 minutes off for at least the first 2 days following surgery. This will help  minimize swelling and discomfort. After the first few days you can continue using ice packs if they are helpful  The skin incisions are closed with sutures and purple skin glue. Both of these things dissolve on their own over the course of several weeks.   Avoid lifting anything heavier than 15 pounds for 1 month Do not put any direct pressure on the incision for long periods of time for the first 6 weeks following surgery. For example, do not ride a bicycle, motorcycle, ATV, or horse.  Sitting on a soft cushion, "donut" cushion, or pillow can help with the discomfort Avoid all sexual contact for 2 weeks following the surgery. You will be prescribed an antibiotic following the surgery; please take this as prescribed.  For pain post-operatively, you will typically be prescribed 2 medicines. The first is an anti-inflammatory medication (Celebrex, Toradol, Meloxicam). The second is narcotic pain medication (Tramadol, Oxycodone). You can take these as needed, and can supplement with over the counter tylenol. I recommend limiting the amount of narcotic pain medication you take as these medications can cause constipation and in rare cases, addiction.  It's important  to remember that your sphincter will not be activated until your 6 week follow up appointment. This means you will likely still leak urine immediately after the surgery. Please continue using pads or depends as needed  

## 2022-10-04 NOTE — Discharge Summary (Shared)
Date of admission: 10/04/2022  Date of discharge: 10/05/2022  Admission diagnosis:  Status post implantation of artificial urinary sphincter [Z96.0]   Discharge diagnosis:  Status post implantation of artificial urinary sphincter [Z96.0]  Secondary diagnoses:   Active Ambulatory Problems    Diagnosis Date Noted   Hyperlipidemia 08/26/2008   ERECTILE DYSFUNCTION 08/26/2008   ALLERGIC RHINITIS 08/26/2008   GERD 08/26/2008   DIVERTICULOSIS, COLON 08/26/2008   Left shoulder pain 12/29/2008   LOW BACK PAIN 08/26/2008   RASH-NONVESICULAR 08/26/2008   Left inguinal hernia 06/01/2011   Impaired glucose tolerance 06/22/2011   Encounter for well adult exam with abnormal findings 06/22/2011   Encounter for other preprocedural examination 06/23/2011   Right knee pain 06/23/2011   HTN (hypertension) 06/23/2011   Degenerative joint disease of knee, right    Benign head tremor    Acute encephalopathy 08/03/2011   Peripheral neuropathy 12/13/2011   B12 deficiency 12/15/2011   MGUS (monoclonal gammopathy of unknown significance)    Depression 07/26/2012   Impingement syndrome of right shoulder 07/26/2012   Acute gouty arthritis 11/09/2012   Anemia, unspecified 02/21/2013   Left rotator cuff tear arthropathy 03/01/2013   Cervical disc disorder with radiculopathy of cervical region 07/16/2013   History of prostate cancer 10/06/2013   Preventive measure 01/05/2015   Polyarthralgia 09/23/2015   Bilateral wrist pain 11/26/2015   Macrocytic anemia with vitamin B12 deficiency 01/05/2016   Muscle cramp 01/06/2016   Hypotensive episode 01/06/2016   AKI (acute kidney injury) (HCC) 01/06/2016   Metabolic acidosis 01/06/2016   Lactic acidosis 01/06/2016   Male stress incontinence 03/08/2016   Bilateral shoulder pain 03/22/2016   Rotator cuff tear arthropathy of right shoulder, with instability 10/25/2016   Rotator cuff arthropathy, right 10/25/2016   S/P shoulder replacement    Gastrointestinal  hemorrhage 04/15/2019   Black stool 04/23/2019   Family history of colon cancer 04/23/2019   Anxiety 05/28/2019   Elevated serum creatinine 01/21/2020   Elevated liver enzymes 01/21/2020   Scapholunate dissociation of left wrist 05/16/2017   Wrist arthritis 05/16/2017   Iron deficiency 05/21/2020   Vitamin D deficiency 11/19/2020   Sensorineural hearing loss (SNHL) of both ears 12/08/2021   Tinnitus of right ear 12/08/2021   Chronic pain of left thumb 06/04/2022   Resolved Ambulatory Problems    Diagnosis Date Noted   GLUCOSE INTOLERANCE 08/26/2008   PSA, INCREASED 08/26/2008   Prostate cancer (HCC) 06/23/2011   Syncope 06/23/2011   Acute sinus infection 11/09/2012   Sepsis (HCC) 01/06/2016   Past Medical History:  Diagnosis Date   Arthritis    Chest tightness    CVA (cerebrovascular accident due to intracerebral hemorrhage) (HCC) 08/03/2011   Diverticulosis    GERD (gastroesophageal reflux disease)    History of blood transfusion    History of kidney stones    History of urinary tract infection    Hypertension    Macrocytic anemia    Stress incontinence    Stroke (HCC)      History and Physical: For full details, please see admission history and physical. Briefly, Nathan Wallace is a 72 y.o. year old patient who was admitted with stress urinary incontinence now s/p artifical urinary sphincter placement on 6/25.   Hospital Course:  Patient did well post operatively.   On the day of discharge, the patient was tolerating a regular diet and their pain was well controlled. Foley catheter was removed and patient passed TOV.  They were determined to be stable for discharge  home. AUS currently deactivated at this time.   Laboratory values: No results for input(s): "HGB", "HCT" in the last 72 hours. No results for input(s): "CREATININE" in the last 72 hours.  Physical Exam.  Gen: NAD Resp: Normal work of breathing on RA Card: Regular rate Abdominal: Soft, non tender, non  distended GU: Perineal and right lower quadrant incision clean dry and intact. Scrotum with mild swelling. AUS pump palpbable in scrotum. Voiding spontaneously  Neuro: Alert   Disposition: Home  Discharge medications:  Allergies as of 10/05/2022       Reactions   Nsaids Other (See Comments)   Gastric ulcer        Medication List     TAKE these medications    acetaminophen 500 MG tablet Commonly known as: TYLENOL Take 2 tablets (1,000 mg total) by mouth every 6 (six) hours.   allopurinol 300 MG tablet Commonly known as: ZYLOPRIM TAKE 1 TABLET BY MOUTH EVERY DAY   amLODipine 5 MG tablet Commonly known as: NORVASC TAKE 1 TABLET (5 MG TOTAL) BY MOUTH DAILY.   atorvastatin 40 MG tablet Commonly known as: LIPITOR Take 1 tablet (40 mg total) by mouth daily.   B-12 COMPLIANCE INJECTION IJ Inject 1 Dose as directed every 30 (thirty) days.   gabapentin 300 MG capsule Commonly known as: NEURONTIN TAKE 2 CAPSULES BY MOUTH 3 TIMES A DAY What changed:  how much to take how to take this when to take this additional instructions   oxyCODONE 5 MG immediate release tablet Commonly known as: Oxy IR/ROXICODONE Take 1 tablet (5 mg total) by mouth every 6 (six) hours as needed for severe pain.   pantoprazole 40 MG tablet Commonly known as: PROTONIX TAKE 1 TABLET BY MOUTH EVERY DAY   PRESCRIPTION MEDICATION 1 Syringe by Intracavernosal route every other day as needed (10-30 minutes before you have sex for ED). Matrix (Papaverine 30mg  Phentolamine 1mg  Alprostadil per ml injection)  erectile dysfunction injection every other day as needed for sex   sulfamethoxazole-trimethoprim 800-160 MG tablet Commonly known as: BACTRIM DS Take 1 tablet by mouth 2 (two) times daily.   tiZANidine 4 MG tablet Commonly known as: ZANAFLEX TAKE 1 TABLET BY MOUTH 3  TIMES DAILY AS NEEDED FOR  MUSCLE SPASM(S)   traMADol 50 MG tablet Commonly known as: ULTRAM Take 1 tablet (50 mg total) by  mouth every 6 (six) hours as needed.   vitamin C with rose hips 1000 MG tablet Take 1 tablet (1,000 mg total) by mouth daily. What changed: how much to take   Vitamin D 50 MCG (2000 UT) tablet Take 4,000 Units by mouth daily.        Followup:   Follow up in 6 weeks. AUS will be activated at that time.

## 2022-10-04 NOTE — Transfer of Care (Signed)
Immediate Anesthesia Transfer of Care Note  Patient: Nathan Wallace  Procedure(s) Performed: ARTIFICIAL URINARY SPHINCTER  Patient Location: PACU  Anesthesia Type:General  Level of Consciousness: awake, alert , and oriented  Airway & Oxygen Therapy: Patient Spontanous Breathing and Patient connected to face mask oxygen  Post-op Assessment: Report given to RN and Post -op Vital signs reviewed and stable  Post vital signs: Reviewed and stable  Last Vitals:  Vitals Value Taken Time  BP 145/97 10/04/22 1007  Temp    Pulse 73 10/04/22 1008  Resp 24 10/04/22 1008  SpO2 98 % 10/04/22 1008  Vitals shown include unvalidated device data.  Last Pain:  Vitals:   10/04/22 0552  TempSrc: Oral         Complications: No notable events documented.

## 2022-10-04 NOTE — Anesthesia Preprocedure Evaluation (Addendum)
Anesthesia Evaluation  Patient identified by MRN, date of birth, ID band Patient awake    Reviewed: Allergy & Precautions, NPO status , Patient's Chart, lab work & pertinent test results  Airway Mallampati: III  TM Distance: >3 FB Neck ROM: Full    Dental  (+) Teeth Intact, Dental Advisory Given   Pulmonary former smoker   Pulmonary exam normal breath sounds clear to auscultation       Cardiovascular hypertension (143/89 preop, per pt normally 120s SBP), Pt. on medications Normal cardiovascular exam+ Valvular Problems/Murmurs (mild MR) MR  Rhythm:Regular Rate:Normal  Echo 2013 - Left ventricle: The cavity size was normal. Wall thickness    was increased in a pattern of mild LVH. Systolic function    was normal. The estimated ejection fraction was in the    range of 55% to 65%. Wall motion was normal; there were no    regional wall motion abnormalities. Doppler parameters are    consistent with abnormal left ventricular relaxation    (grade 1 diastolic dysfunction).  - Aortic valve: Trivial regurgitation.  - Mitral valve: Mild regurgitation.  - Atrial septum: No defect or patent foramen ovale was    identified.     Neuro/Psych  PSYCHIATRIC DISORDERS Anxiety Depression    CVA (2013, pt denies), No Residual Symptoms    GI/Hepatic Neg liver ROS,GERD  Controlled and Medicated,,  Endo/Other  negative endocrine ROS    Renal/GU negative Renal ROS Bladder dysfunction      Musculoskeletal  (+) Arthritis , Osteoarthritis,    Abdominal   Peds  Hematology negative hematology ROS (+) Hb 13.6, plt 227   Anesthesia Other Findings   Reproductive/Obstetrics negative OB ROS                             Anesthesia Physical Anesthesia Plan  ASA: 2  Anesthesia Plan: General   Post-op Pain Management: Tylenol PO (pre-op)*   Induction: Intravenous  PONV Risk Score and Plan: 3 and Ondansetron,  Dexamethasone, Treatment may vary due to age or medical condition and Midazolam  Airway Management Planned: LMA  Additional Equipment: None  Intra-op Plan:   Post-operative Plan: Extubation in OR  Informed Consent: I have reviewed the patients History and Physical, chart, labs and discussed the procedure including the risks, benefits and alternatives for the proposed anesthesia with the patient or authorized representative who has indicated his/her understanding and acceptance.     Dental advisory given  Plan Discussed with: CRNA  Anesthesia Plan Comments:         Anesthesia Quick Evaluation

## 2022-10-04 NOTE — Anesthesia Procedure Notes (Signed)
Procedure Name: LMA Insertion Date/Time: 10/04/2022 7:33 AM  Performed by: Leisa Gault D, CRNAPre-anesthesia Checklist: Patient identified, Emergency Drugs available, Suction available and Patient being monitored Patient Re-evaluated:Patient Re-evaluated prior to induction Oxygen Delivery Method: Circle system utilized Preoxygenation: Pre-oxygenation with 100% oxygen Induction Type: IV induction Ventilation: Mask ventilation without difficulty LMA: LMA inserted LMA Size: 4.0 Tube type: Oral Number of attempts: 2 Placement Confirmation: positive ETCO2 and breath sounds checked- equal and bilateral Tube secured with: Tape Dental Injury: Teeth and Oropharynx as per pre-operative assessment

## 2022-10-04 NOTE — H&P (Signed)
H&P  History of Present Illness: Nathan Wallace is a 72 y.o. year old M who presents today for insertion of an artificial urinary sphincter  No acute complaints  Past Medical History:  Diagnosis Date   AKI (acute kidney injury) (HCC)    Arthritis    RT KNEE AND RT SHOULDER   B12 deficiency    Benign head tremor    Chest tightness    comes and goes    CVA (cerebrovascular accident due to intracerebral hemorrhage) (HCC) 08/03/2011   Degenerative joint disease of knee, right    Depression 07/26/2012   no problems now   Diverticulosis    GERD (gastroesophageal reflux disease)    NO RECENT PROBLEMS AND NO MEDS   History of blood transfusion    History of kidney stones    History of urinary tract infection    Hyperlipidemia    border line   Hypertension    Impaired glucose tolerance 06/22/2011   Left inguinal hernia    Macrocytic anemia    MGUS (monoclonal gammopathy of unknown significance)    Prostate cancer (HCC) 2011   Rotator cuff tear arthropathy of right shoulder, with instability 10/25/2016   Sepsis (HCC)    Stress incontinence    Stroke [facility])    2013   Syncope 2012   after prostate surgery; neg neurology work up.     Past Surgical History:  Procedure Laterality Date   COLONOSCOPY     INGUINAL HERNIA REPAIR  06/01/2011   Procedure: HERNIA REPAIR INGUINAL ADULT;  Surgeon: Ernestene Mention, MD;  Location: [facility];  Service: General;  Laterality: Left;  Left Inguinal Hernia Repair with Mesh   ROBOT ASSISTED LAPAROSCOPIC RADICAL PROSTATECTOMY  jan 2012   da vinci   SPINE SURGERY  2003 OR 2004   CERVICAL FUSION-STATES LIMITED ROM NECK   TOTAL SHOULDER ARTHROPLASTY Right 10/25/2016   TOTAL SHOULDER ARTHROPLASTY Right 10/25/2016   Procedure: RIGHT TOTAL SHOULDER ARTHROPLASTY;  Surgeon: Teryl Lucy, MD;  Location: [facility] OR;  Service: Orthopedics;  Laterality: Right;   URETHRAL SLING N/A 03/08/2016   Procedure: MALE SLING CYSTOSCOPY;  Surgeon: Alfredo Martinez, MD;  Location:  [facility] ORS;  Service: Urology;  Laterality: N/A;   WRIST SURGERY     right     Home Medications:  Current Facility-Administered Medications for the 10/04/22 encounter [facility] Encounter)  Medication   cyanocobalamin ((VITAMIN B-12)) injection 1,000 mcg   Current Meds  Medication Sig   allopurinol (ZYLOPRIM) 300 MG tablet TAKE 1 TABLET BY MOUTH EVERY DAY   amLODipine (NORVASC) 5 MG tablet TAKE 1 TABLET (5 MG TOTAL) BY MOUTH DAILY.   Ascorbic Acid (VITAMIN C WITH ROSE HIPS) 1000 MG tablet Take 1 tablet (1,000 mg total) by mouth daily. (Patient taking differently: Take 2,000 mg by mouth daily.)   atorvastatin (LIPITOR) 40 MG tablet Take 1 tablet (40 mg total) by mouth daily.   Cholecalciferol (VITAMIN D) 50 MCG (2000 UT) tablet Take 4,000 Units by mouth daily.   Cyanocobalamin (B-12 COMPLIANCE INJECTION IJ) Inject 1 Dose as directed every 30 (thirty) days.   gabapentin (NEURONTIN) 300 MG capsule TAKE 2 CAPSULES BY MOUTH 3 TIMES A DAY (Patient taking differently: Take 600 mg by mouth 2 (two) times daily.)   pantoprazole (PROTONIX) 40 MG tablet TAKE 1 TABLET BY MOUTH EVERY DAY   PRESCRIPTION MEDICATION 1 Syringe by Intracavernosal route every other day as needed (10-30 minutes before you have sex for ED). Matrix (Papaverine 30mg  Phentolamine 1mg  Alprostadil  per ml injection)  erectile dysfunction injection every other day as needed for sex   tiZANidine (ZANAFLEX) 4 MG tablet TAKE 1 TABLET BY MOUTH 3  TIMES DAILY AS NEEDED FOR  MUSCLE SPASM(S)   traMADol (ULTRAM) 50 MG tablet Take 1 tablet (50 mg total) by mouth every 6 (six) hours as needed.    Allergies:  Allergies  Allergen Reactions   Nsaids Other (See Comments)    Gastric ulcer    Family History  Problem Relation Age of Onset   Hypertension Mother    Aneurysm Mother        brain   Colon cancer Father 70   Heart attack Brother     Social History:  reports that he quit smoking about 56 years ago. His smoking use included  cigarettes. He has a 0.50 pack-year smoking history. He has never used smokeless tobacco. He reports current alcohol use. He reports that he does not currently use drugs after having used the following drugs: Marijuana.  ROS: A complete review of systems was performed.  All systems are negative except for pertinent findings as noted.  Physical Exam:  Vital signs in last 24 hours: Temp:  [98 F (36.7 C)] 98 F (36.7 C) (06/25 0552) Pulse Rate:  [59] 59 (06/25 0552) Resp:  [16] 16 (06/25 0552) BP: (143)/(89) 143/89 (06/25 0552) SpO2:  [97 %] 97 % (06/25 0552) Weight:  [73.9 kg] 73.9 kg (06/25 0540) Constitutional:  Alert and oriented, No acute distress Cardiovascular: Regular rate and rhythm Respiratory: Normal respiratory effort, Lungs clear bilaterally GI: Abdomen is soft, nontender, nondistended, no abdominal masses Lymphatic: No lymphadenopathy Neurologic: Grossly intact, no focal deficits Psychiatric: Normal mood and affect   Laboratory Data:  No results for input(s): "WBC", "HGB", "HCT", "PLT" in the last 72 hours.  No results for input(s): "NA", "K", "CL", "GLUCOSE", "BUN", "CALCIUM", "CREATININE" in the last 72 hours.  Invalid input(s): "CO3"   No results found for this or any previous visit (from the past 24 hour(s)). Recent Results (from the past 240 hour(s))  Urine Culture     Status: Abnormal   Collection Time: 09/26/22  9:07 AM   Specimen: Urine, Clean Catch  Result Value Ref Range Status   Specimen Description   Final    URINE, CLEAN CATCH Performed at [facility], [address] [place], [place] [address]    Special Requests   Final    NONE Performed at [facility], [address] [place], [place] [address]    Culture (A)  Final    <10,000 COLONIES/mL INSIGNIFICANT GROWTH Performed at [facility], [address], [place], [place] [address]    Report Status 09/27/2022 FINAL  Final    Renal Function: No  results for input(s): "CREATININE" in the last 168 hours. Estimated Creatinine Clearance: 61.8 mL/min (by C-G formula based on SCr of 1.13 mg/dL).  Radiologic Imaging: No results found.  Assessment:  Nathan Wallace is a 72 y.o. year old M with post prostatectomy SUI, s/p male sling   Plan:  To OR as planned for AUS. Procedure and risks reviewed, including but not limited to bleeding, infection, implant infection, implant malfunction, implant malplacement, erosion, damage to adjacent structures, pain, urinary retention. All questions answered   Irine Seal, MD 10/04/2022, 7:16 AM  [facility] Pager: [phone]

## 2022-10-04 NOTE — Progress Notes (Signed)
Mobility Specialist - Progress Note   10/04/22 1447  Mobility  Activity Ambulated independently in hallway  Level of Assistance Standby assist, set-up cues, supervision of patient - no hands on  Assistive Device Other (Comment) (IV Pole)  Distance Ambulated (ft) 350 ft  Range of Motion/Exercises Active  Activity Response Tolerated fair  Mobility Referral Yes  $Mobility charge 1 Mobility  Mobility Specialist Start Time (ACUTE ONLY) 1430  Mobility Specialist Stop Time (ACUTE ONLY) 1445  Mobility Specialist Time Calculation (min) (ACUTE ONLY) 15 min   Pt was found in bed and agreeable to ambulate. C/o level 8/10 abdominal pain towards EOS and RN notified. At EOS was left in bed with needs met. Call bell in reach.  Billey Chang Mobility Specialist

## 2022-10-05 ENCOUNTER — Encounter (HOSPITAL_COMMUNITY): Payer: Self-pay | Admitting: Urology

## 2022-10-05 DIAGNOSIS — Z79899 Other long term (current) drug therapy: Secondary | ICD-10-CM | POA: Diagnosis not present

## 2022-10-05 DIAGNOSIS — Z96611 Presence of right artificial shoulder joint: Secondary | ICD-10-CM | POA: Diagnosis not present

## 2022-10-05 DIAGNOSIS — I10 Essential (primary) hypertension: Secondary | ICD-10-CM | POA: Diagnosis not present

## 2022-10-05 DIAGNOSIS — Z87891 Personal history of nicotine dependence: Secondary | ICD-10-CM | POA: Diagnosis not present

## 2022-10-05 DIAGNOSIS — Z8546 Personal history of malignant neoplasm of prostate: Secondary | ICD-10-CM | POA: Diagnosis not present

## 2022-10-05 DIAGNOSIS — Z8673 Personal history of transient ischemic attack (TIA), and cerebral infarction without residual deficits: Secondary | ICD-10-CM | POA: Diagnosis not present

## 2022-10-05 DIAGNOSIS — N393 Stress incontinence (female) (male): Secondary | ICD-10-CM | POA: Diagnosis not present

## 2022-10-05 MED ORDER — SULFAMETHOXAZOLE-TRIMETHOPRIM 800-160 MG PO TABS: 1.0000 | ORAL_TABLET | Freq: Two times a day (BID) | ORAL | 0 refills | Status: DC

## 2022-10-05 MED ORDER — ACETAMINOPHEN 500 MG PO TABS: 1000.0000 mg | ORAL_TABLET | Freq: Four times a day (QID) | ORAL | 0 refills | Status: DC

## 2022-10-05 MED ORDER — OXYCODONE HCL 5 MG PO TABS: 5.0000 mg | ORAL_TABLET | Freq: Four times a day (QID) | ORAL | 0 refills | Status: DC | PRN

## 2022-10-05 NOTE — Progress Notes (Signed)
Mobility Specialist - Progress Note   10/05/22 1122  Mobility  Activity Ambulated independently in hallway  Level of Assistance Independent  Assistive Device None  Distance Ambulated (ft) 700 ft  Activity Response Tolerated well  Mobility Referral Yes  $Mobility charge 1 Mobility  Mobility Specialist Start Time (ACUTE ONLY) 1106  Mobility Specialist Stop Time (ACUTE ONLY) 1120  Mobility Specialist Time Calculation (min) (ACUTE ONLY) 14 min   Pt received in room standing and agreeable to mobility. No complaints during session. Pt to room after session with all needs met.   [facility]

## 2022-10-05 NOTE — Progress Notes (Signed)
Transition of Care [facility]) - Inpatient Brief Assessment   Patient Details  Name: Nathan Wallace MRN: [id number] Date of Birth: 11/14/1950  Transition of Care [facility]) CM/SW Contact:    Larrie Kass, LCSW Phone Number: 10/05/2022, 10:31 AM   Clinical Narrative:  Transition of Care Department [facility]) has reviewed patient and no TOC needs have been identified at this time. We will continue to monitor patient advancement through interdisciplinary progression rounds. If new patient transition needs arise, please place a TOC consult.    Transition of Care Asessment: Insurance and Status: Insurance coverage has been reviewed Patient has primary care physician: Yes Home environment has been reviewed: yes   Prior/Current Home Services: No current home services Social Determinants of Health Reivew: SDOH reviewed no interventions necessary Readmission risk has been reviewed: Yes Transition of care needs: no transition of care needs at this time

## 2022-10-05 NOTE — Care Management Obs Status (Signed)
MEDICARE OBSERVATION STATUS NOTIFICATION   Patient Details  Name: Nathan Wallace MRN: [id number] Date of Birth: 1951-01-23   Medicare Observation Status Notification Given:  Yes    Larrie Kass, LCSW 10/05/2022, 10:26 AM

## 2022-10-19 ENCOUNTER — Ambulatory Visit (INDEPENDENT_AMBULATORY_CARE_PROVIDER_SITE_OTHER): Payer: Medicare HMO | Admitting: *Deleted

## 2022-10-19 DIAGNOSIS — E538 Deficiency of other specified B group vitamins: Secondary | ICD-10-CM

## 2022-10-19 MED ORDER — CYANOCOBALAMIN 1000 MCG/ML IJ SOLN
1000.0000 ug | Freq: Once | INTRAMUSCULAR | Status: AC
Start: 2022-10-19 — End: 2022-10-19
  Administered 2022-10-19: 1000 ug via INTRAMUSCULAR

## 2022-10-19 NOTE — Progress Notes (Signed)
Pls cosign for B12 inj../lmb  

## 2022-11-14 DIAGNOSIS — N393 Stress incontinence (female) (male): Secondary | ICD-10-CM | POA: Diagnosis not present

## 2022-11-21 ENCOUNTER — Ambulatory Visit: Payer: Medicare HMO | Admitting: *Deleted

## 2022-11-21 DIAGNOSIS — E538 Deficiency of other specified B group vitamins: Secondary | ICD-10-CM

## 2022-11-21 MED ORDER — CYANOCOBALAMIN 1000 MCG/ML IJ SOLN
1000.0000 ug | Freq: Once | INTRAMUSCULAR | Status: AC
Start: 2022-11-21 — End: 2022-11-21
  Administered 2022-11-21: 1000 ug via INTRAMUSCULAR

## 2022-11-21 NOTE — Progress Notes (Signed)
Pls cosign for B12 inj../lmb  

## 2022-11-23 DIAGNOSIS — N393 Stress incontinence (female) (male): Secondary | ICD-10-CM | POA: Diagnosis not present

## 2022-11-24 ENCOUNTER — Encounter (INDEPENDENT_AMBULATORY_CARE_PROVIDER_SITE_OTHER): Payer: Self-pay

## 2022-11-25 DIAGNOSIS — N393 Stress incontinence (female) (male): Secondary | ICD-10-CM | POA: Diagnosis not present

## 2022-11-25 DIAGNOSIS — C61 Malignant neoplasm of prostate: Secondary | ICD-10-CM | POA: Diagnosis not present

## 2022-12-23 ENCOUNTER — Ambulatory Visit (INDEPENDENT_AMBULATORY_CARE_PROVIDER_SITE_OTHER): Payer: Medicare HMO

## 2022-12-23 DIAGNOSIS — E538 Deficiency of other specified B group vitamins: Secondary | ICD-10-CM | POA: Diagnosis not present

## 2022-12-23 DIAGNOSIS — Z23 Encounter for immunization: Secondary | ICD-10-CM | POA: Diagnosis not present

## 2022-12-23 MED ORDER — CYANOCOBALAMIN 1000 MCG/ML IJ SOLN
1000.0000 ug | Freq: Once | INTRAMUSCULAR | Status: AC
Start: 2022-12-23 — End: 2022-12-23
  Administered 2022-12-23: 1000 ug via INTRAMUSCULAR

## 2022-12-23 NOTE — Progress Notes (Signed)
Pt was given B12 and flu vaccine with no complications.

## 2023-01-03 ENCOUNTER — Ambulatory Visit (INDEPENDENT_AMBULATORY_CARE_PROVIDER_SITE_OTHER): Payer: Medicare HMO | Admitting: *Deleted

## 2023-01-03 DIAGNOSIS — Z0000 Encounter for general adult medical examination without abnormal findings: Secondary | ICD-10-CM | POA: Diagnosis not present

## 2023-01-03 NOTE — Patient Instructions (Signed)
Nathan Wallace , Thank you for taking time to come for your Medicare Wellness Visit. I appreciate your ongoing commitment to your health goals. Please review the following plan we discussed and let me know if I can assist you in the future.   Screening recommendations/referrals: Colonoscopy: no longer required Recommended yearly ophthalmology/optometry visit for glaucoma screening and checkup Recommended yearly dental visit for hygiene and checkup  Vaccinations: Influenza vaccine: up to date Pneumococcal vaccine: up to date Tdap vaccine: Education provided Shingles vaccine: up to date    Advanced directives: Education provided     Preventive Care 65 Years and Older, Male Preventive care refers to lifestyle choices and visits with your health care provider that can promote health and wellness. What does preventive care include? A yearly physical exam. This is also called an annual well check. Dental exams once or twice a year. Routine eye exams. Ask your health care provider how often you should have your eyes checked. Personal lifestyle choices, including: Daily care of your teeth and gums. Regular physical activity. Eating a healthy diet. Avoiding tobacco and drug use. Limiting alcohol use. Practicing safe sex. Taking low doses of aspirin every day. Taking vitamin and mineral supplements as recommended by your health care provider. What happens during an annual well check? The services and screenings done by your health care provider during your annual well check will depend on your age, overall health, lifestyle risk factors, and family history of disease. Counseling  Your health care provider may ask you questions about your: Alcohol use. Tobacco use. Drug use. Emotional well-being. Home and relationship well-being. Sexual activity. Eating habits. History of falls. Memory and ability to understand (cognition). Work and work Astronomer. Screening  You may have the  following tests or measurements: Height, weight, and BMI. Blood pressure. Lipid and cholesterol levels. These may be checked every 5 years, or more frequently if you are over 30 years old. Skin check. Lung cancer screening. You may have this screening every year starting at age 69 if you have a 30-pack-year history of smoking and currently smoke or have quit within the past 15 years. Fecal occult blood test (FOBT) of the stool. You may have this test every year starting at age 95. Flexible sigmoidoscopy or colonoscopy. You may have a sigmoidoscopy every 5 years or a colonoscopy every 10 years starting at age 38. Prostate cancer screening. Recommendations will vary depending on your family history and other risks. Hepatitis C blood test. Hepatitis B blood test. Sexually transmitted disease (STD) testing. Diabetes screening. This is done by checking your blood sugar (glucose) after you have not eaten for a while (fasting). You may have this done every 1-3 years. Abdominal aortic aneurysm (AAA) screening. You may need this if you are a current or former smoker. Osteoporosis. You may be screened starting at age 66 if you are at high risk. Talk with your health care provider about your test results, treatment options, and if necessary, the need for more tests. Vaccines  Your health care provider may recommend certain vaccines, such as: Influenza vaccine. This is recommended every year. Tetanus, diphtheria, and acellular pertussis (Tdap, Td) vaccine. You may need a Td booster every 10 years. Zoster vaccine. You may need this after age 68. Pneumococcal 13-valent conjugate (PCV13) vaccine. One dose is recommended after age 6. Pneumococcal polysaccharide (PPSV23) vaccine. One dose is recommended after age 21. Talk to your health care provider about which screenings and vaccines you need and how often you need them.  This information is not intended to replace advice given to you by your health care  provider. Make sure you discuss any questions you have with your health care provider. Document Released: 04/24/2015 Document Revised: 12/16/2015 Document Reviewed: 01/27/2015 Elsevier Interactive Patient Education  2017 [employer].  Fall Prevention in the Home Falls can cause injuries. They can happen to people of all ages. There are many things you can do to make your home safe and to help prevent falls. What can I do on the outside of my home? Regularly fix the edges of walkways and driveways and fix any cracks. Remove anything that might make you trip as you walk through a door, such as a raised step or threshold. Trim any bushes or trees on the path to your home. Use bright outdoor lighting. Clear any walking paths of anything that might make someone trip, such as rocks or tools. Regularly check to see if handrails are loose or broken. Make sure that both sides of any steps have handrails. Any raised decks and porches should have guardrails on the edges. Have any leaves, snow, or ice cleared regularly. Use sand or salt on walking paths during winter. Clean up any spills in your garage right away. This includes oil or grease spills. What can I do in the bathroom? Use night lights. Install grab bars by the toilet and in the tub and shower. Do not use towel bars as grab bars. Use non-skid mats or decals in the tub or shower. If you need to sit down in the shower, use a plastic, non-slip stool. Keep the floor dry. Clean up any water that spills on the floor as soon as it happens. Remove soap buildup in the tub or shower regularly. Attach bath mats securely with double-sided non-slip rug tape. Do not have throw rugs and other things on the floor that can make you trip. What can I do in the bedroom? Use night lights. Make sure that you have a light by your bed that is easy to reach. Do not use any sheets or blankets that are too big for your bed. They should not hang down onto the  floor. Have a firm chair that has side arms. You can use this for support while you get dressed. Do not have throw rugs and other things on the floor that can make you trip. What can I do in the kitchen? Clean up any spills right away. Avoid walking on wet floors. Keep items that you use a lot in easy-to-reach places. If you need to reach something above you, use a strong step stool that has a grab bar. Keep electrical cords out of the way. Do not use floor polish or wax that makes floors slippery. If you must use wax, use non-skid floor wax. Do not have throw rugs and other things on the floor that can make you trip. What can I do with my stairs? Do not leave any items on the stairs. Make sure that there are handrails on both sides of the stairs and use them. Fix handrails that are broken or loose. Make sure that handrails are as long as the stairways. Check any carpeting to make sure that it is firmly attached to the stairs. Fix any carpet that is loose or worn. Avoid having throw rugs at the top or bottom of the stairs. If you do have throw rugs, attach them to the floor with carpet tape. Make sure that you have a light switch at the  top of the stairs and the bottom of the stairs. If you do not have them, ask someone to add them for you. What else can I do to help prevent falls? Wear shoes that: Do not have high heels. Have rubber bottoms. Are comfortable and fit you well. Are closed at the toe. Do not wear sandals. If you use a stepladder: Make sure that it is fully opened. Do not climb a closed stepladder. Make sure that both sides of the stepladder are locked into place. Ask someone to hold it for you, if possible. Clearly mark and make sure that you can see: Any grab bars or handrails. First and last steps. Where the edge of each step is. Use tools that help you move around (mobility aids) if they are needed. These include: Canes. Walkers. Scooters. Crutches. Turn on the  lights when you go into a dark area. Replace any light bulbs as soon as they burn out. Set up your furniture so you have a clear path. Avoid moving your furniture around. If any of your floors are uneven, fix them. If there are any pets around you, be aware of where they are. Review your medicines with your doctor. Some medicines can make you feel dizzy. This can increase your chance of falling. Ask your doctor what other things that you can do to help prevent falls. This information is not intended to replace advice given to you by your health care provider. Make sure you discuss any questions you have with your health care provider. Document Released: 01/22/2009 Document Revised: 09/03/2015 Document Reviewed: 05/02/2014 Elsevier Interactive Patient Education  2017 [employer].

## 2023-01-03 NOTE — Progress Notes (Signed)
Subjective:   Nathan Wallace is a 72 y.o. male who presents for Medicare Annual/Subsequent preventive examination.  Visit Complete: Virtual  I connected with  Drenda Freeze on 01/03/23 by a audio enabled telemedicine application and verified that I am speaking with the correct person using two identifiers.  Patient Location: Home  Provider Location: Home Office  I discussed the limitations of evaluation and management by telemedicine. The patient expressed understanding and agreed to proceed.  Vital Signs: Unable to obtain new vitals due to this being a telehealth visit.   Cardiac Risk Factors include: advanced age (>52men, >16 women);male gender;hypertension     Objective:    There were no vitals filed for this visit. There is no height or weight on file to calculate BMI.     01/03/2023   10:34 AM 10/04/2022   12:03 PM 10/04/2022    5:49 AM 09/26/2022    8:47 AM 12/24/2021    4:18 PM 11/14/2021    2:40 PM 10/25/2016    4:28 PM  Advanced Directives  Does Patient Have a Medical Advance Directive? No No No No No No No  Would patient like information on creating a medical advance directive? No - Patient declined No - Patient declined No - Patient declined No - Patient declined Yes (MAU/Ambulatory/Procedural Areas - Information given)  No - Patient declined    Current Medications (verified) Outpatient Encounter Medications as of 01/03/2023  Medication Sig   acetaminophen (TYLENOL) 500 MG tablet Take 2 tablets (1,000 mg total) by mouth every 6 (six) hours.   allopurinol (ZYLOPRIM) 300 MG tablet TAKE 1 TABLET BY MOUTH EVERY DAY   amLODipine (NORVASC) 5 MG tablet TAKE 1 TABLET (5 MG TOTAL) BY MOUTH DAILY.   Ascorbic Acid (VITAMIN C WITH ROSE HIPS) 1000 MG tablet Take 1 tablet (1,000 mg total) by mouth daily. (Patient taking differently: Take 2,000 mg by mouth daily.)   atorvastatin (LIPITOR) 40 MG tablet Take 1 tablet (40 mg total) by mouth daily.   Cholecalciferol (VITAMIN D) 50 MCG  (2000 UT) tablet Take 4,000 Units by mouth daily.   Cyanocobalamin (B-12 COMPLIANCE INJECTION IJ) Inject 1 Dose as directed every 30 (thirty) days.   gabapentin (NEURONTIN) 300 MG capsule TAKE 2 CAPSULES BY MOUTH 3 TIMES A DAY (Patient taking differently: Take 600 mg by mouth 2 (two) times daily.)   oxyCODONE (OXY IR/ROXICODONE) 5 MG immediate release tablet Take 1 tablet (5 mg total) by mouth every 6 (six) hours as needed for severe pain.   pantoprazole (PROTONIX) 40 MG tablet TAKE 1 TABLET BY MOUTH EVERY DAY   PRESCRIPTION MEDICATION 1 Syringe by Intracavernosal route every other day as needed (10-30 minutes before you have sex for ED). Matrix (Papaverine 30mg  Phentolamine 1mg  Alprostadil per ml injection)  erectile dysfunction injection every other day as needed for sex   sulfamethoxazole-trimethoprim (BACTRIM DS) 800-160 MG tablet Take 1 tablet by mouth 2 (two) times daily.   tiZANidine (ZANAFLEX) 4 MG tablet TAKE 1 TABLET BY MOUTH 3  TIMES DAILY AS NEEDED FOR  MUSCLE SPASM(S)   traMADol (ULTRAM) 50 MG tablet Take 1 tablet (50 mg total) by mouth every 6 (six) hours as needed.   Facility-Administered Encounter Medications as of 01/03/2023  Medication   cyanocobalamin ((VITAMIN B-12)) injection 1,000 mcg    Allergies (verified) Nsaids   History: Past Medical History:  Diagnosis Date   AKI (acute kidney injury) (HCC)    Arthritis    RT KNEE AND RT SHOULDER  B12 deficiency    Benign head tremor    Chest tightness    comes and goes    CVA (cerebrovascular accident due to intracerebral hemorrhage) (HCC) 08/03/2011   Degenerative joint disease of knee, right    Depression 07/26/2012   no problems now   Diverticulosis    GERD (gastroesophageal reflux disease)    NO RECENT PROBLEMS AND NO MEDS   History of blood transfusion    History of kidney stones    History of urinary tract infection    Hyperlipidemia    border line   Hypertension    Impaired glucose tolerance  06/22/2011   Left inguinal hernia    Macrocytic anemia    MGUS (monoclonal gammopathy of unknown significance)    Prostate cancer (HCC) 2011   Rotator cuff tear arthropathy of right shoulder, with instability 10/25/2016   Sepsis (HCC)    Stress incontinence    Stroke [facility])    2013   Syncope 2012   after prostate surgery; neg neurology work up.    Tinnitus of right ear 12/08/2021   [place], non-pulsatile     Past Surgical History:  Procedure Laterality Date   COLONOSCOPY     INGUINAL HERNIA REPAIR  06/01/2011   Procedure: HERNIA REPAIR INGUINAL ADULT;  Surgeon: Ernestene Mention, MD;  Location: [facility];  Service: General;  Laterality: Left;  Left Inguinal Hernia Repair with Mesh   ROBOT ASSISTED LAPAROSCOPIC RADICAL PROSTATECTOMY  jan 2012   da vinci   SPINE SURGERY  2003 OR 2004   CERVICAL FUSION-STATES LIMITED ROM NECK   TOTAL SHOULDER ARTHROPLASTY Right 10/25/2016   TOTAL SHOULDER ARTHROPLASTY Right 10/25/2016   Procedure: RIGHT TOTAL SHOULDER ARTHROPLASTY;  Surgeon: Teryl Lucy, MD;  Location: [facility] OR;  Service: Orthopedics;  Laterality: Right;   URETHRAL SLING N/A 03/08/2016   Procedure: MALE SLING CYSTOSCOPY;  Surgeon: Alfredo Martinez, MD;  Location: [facility];  Service: Urology;  Laterality: N/A;   URINARY SPHINCTER IMPLANT N/A 10/04/2022   Procedure: ARTIFICIAL URINARY SPHINCTER;  Surgeon: Despina Arias, MD;  Location: [facility];  Service: Urology;  Laterality: N/A;  120 MINUTES NEEDED FOR CASE   WRIST SURGERY     right    Family History  Problem Relation Age of Onset   Hypertension Mother    Aneurysm Mother        brain   Colon cancer Father 20   Heart attack Brother    Social History   Socioeconomic History   Marital status: Married    Spouse name: Not on file   Number of children: 3   Years of education: Not on file   Highest education level: Not on file  Occupational History    Employer: RETIRED    Comment: retired Acupuncturist.    Occupation: business  Patent attorney and remodeling  Tobacco Use   Smoking status: Former    Current packs/day: 0.00    Average packs/day: 0.5 packs/day for 1 year (0.5 ttl pk-yrs)    Types: Cigarettes    Start date: 06/27/1965    Quit date: 06/28/1966    Years since quitting: 56.5   Smokeless tobacco: Never  Vaping Use   Vaping status: Never Used  Substance and Sexual Activity   Alcohol use: Yes    Comment: 3 beers daily; liquor 2 glasses every other day    Drug use: Not Currently    Types: Marijuana    Comment: Has not used in years   Sexual activity:  Not Currently  Other Topics Concern   Not on file  Social History Narrative   Not on file   Social Determinants of Health   Financial Resource Strain: Low Risk  (01/03/2023)   Overall Financial Resource Strain (CARDIA)    Difficulty of Paying Living Expenses: Not hard at all  Food Insecurity: No Food Insecurity (01/03/2023)   Hunger Vital Sign    Worried About Running Out of Food in the Last Year: Never true    Ran Out of Food in the Last Year: Never true  Transportation Needs: No Transportation Needs (10/04/2022)   PRAPARE - Administrator, Civil Service (Medical): No    Lack of Transportation (Non-Medical): No  Physical Activity: Sufficiently Active (01/03/2023)   Exercise Vital Sign    Days of Exercise per Week: 4 days    Minutes of Exercise per Session: 40 min  Stress: No Stress Concern Present (01/03/2023)   [employer] of Occupational Health - Occupational Stress Questionnaire    Feeling of Stress : Not at all  Social Connections: Socially Integrated (01/03/2023)   Social Connection and Isolation Panel [NHANES]    Frequency of Communication with Friends and Family: More than three times a week    Frequency of Social Gatherings with Friends and Family: Not on file    Attends Religious Services: More than 4 times per year    Active Member of [employer] or Organizations: Yes    Attends Engineer, structural: More  than 4 times per year    Marital Status: Married    Tobacco Counseling Counseling given: Not Answered   Clinical Intake:  Pre-visit preparation completed: Yes  Pain : No/denies pain     Diabetes: No  How often do you need to have someone help you when you read instructions, pamphlets, or other written materials from your doctor or pharmacy?: 1 - Never  Interpreter Needed?: No  Information entered by :: Remi Haggard LPN   Activities of Daily Living    01/03/2023   10:35 AM 10/04/2022   12:06 PM  In your present state of health, do you have any difficulty performing the following activities:  Hearing? 0   Vision? 0   Difficulty concentrating or making decisions? 0   Dressing or bathing? 0   Doing errands, shopping? 0 0  Preparing Food and eating ? N   Using the Toilet? N   In the past six months, have you accidently leaked urine? Y   Do you have problems with loss of bowel control? N   Managing your Medications? N   Managing your Finances? N   Housekeeping or managing your Housekeeping? N     Patient Care Team: Corwin Levins, MD as PCP - General Milas Gain, MD as Attending Physician (Neurology) Julio Sicks, MD (Neurosurgery) Artis Delay, MD as Consulting Physician (Hematology and Oncology)  Indicate any recent Medical Services you may have received from other than [facility] providers in the past year (date may be approximate).     Assessment:   This is a routine wellness examination for [place].  Hearing/Vision screen Hearing Screening - Comments:: No trouble hearing Vision Screening - Comments:: Up to date [facility]   Goals Addressed             This Visit's Progress    Patient Stated       Make more money       Depression Screen    01/03/2023   10:31 AM  05/31/2022    9:16 AM 12/24/2021    4:29 PM 05/27/2021    9:09 AM 05/27/2021    8:51 AM 05/21/2020    9:25 AM 05/21/2020    8:50 AM  PHQ 2/9 Scores  PHQ - 2 Score 0 0 0 1 1 1 3   PHQ- 9  Score 0 0         Fall Risk    01/03/2023   10:28 AM 05/31/2022    9:16 AM 12/24/2021    4:18 PM 05/27/2021    9:09 AM 05/27/2021    8:51 AM  Fall Risk   Falls in the past year? 0 0 0 0 0  Number falls in past yr: 0 0 0 0 0  Injury with Fall? 0 0 0 0 0  Risk for fall due to :  No Fall Risks No Fall Risks    Follow up Falls evaluation completed;Education provided;Falls prevention discussed Falls evaluation completed Falls prevention discussed      MEDICARE RISK AT HOME: Medicare Risk at Home Any stairs in or around the home?: No If so, are there any without handrails?: No Home free of loose throw rugs in walkways, pet beds, electrical cords, etc?: Yes Adequate lighting in your home to reduce risk of falls?: Yes Life alert?: No Use of a cane, walker or w/c?: No Grab bars in the bathroom?: Yes Shower chair or bench in shower?: No Elevated toilet seat or a handicapped toilet?: No  TIMED UP AND GO:  Was the test performed?  No    Cognitive Function:        01/03/2023   10:32 AM 12/24/2021    4:33 PM  6CIT Screen  What Year? 0 points 0 points  What month? 0 points 0 points  What time? 0 points 0 points  Count back from 20 0 points 0 points  Months in reverse 0 points 0 points  Repeat phrase 0 points 0 points  Total Score 0 points 0 points    Immunizations Immunization History  Administered Date(s) Administered   Fluad Quad(high Dose 65+) 12/06/2018, 01/06/2020, 01/21/2021, 05/19/2022   Fluad Trivalent(High Dose 65+) 12/23/2022   Influenza Split 12/19/2011   Influenza, High Dose Seasonal PF 12/25/2015, 02/02/2017, 01/05/2018   Influenza,inj,Quad PF,6+ Mos 02/28/2013, 04/09/2014, 01/05/2015   PFIZER Comirnaty(Gray Top)Covid-19 Tri-Sucrose Vaccine 07/30/2020   PFIZER(Purple Top)SARS-COV-2 Vaccination 05/24/2019, 06/18/2019, 01/21/2020   Pfizer Covid-19 Vaccine Bivalent Booster 19yrs & up 03/03/2021   Pneumococcal Conjugate-13 05/18/2017   Pneumococcal Polysaccharide-23  05/22/2018   Td 08/26/2008   Tdap 02/23/2012   Unspecified SARS-COV-2 Vaccination 02/02/2022   Zoster Recombinant(Shingrix) 12/15/2021, 05/31/2022   Zoster, Live 07/26/2012    TDAP status: Due, Education has been provided regarding the importance of this vaccine. Advised may receive this vaccine at local pharmacy or Health Dept. Aware to provide a copy of the vaccination record if obtained from local pharmacy or Health Dept. Verbalized acceptance and understanding.  Flu Vaccine status: Up to date  Pneumococcal vaccine status: Up to date  Covid-19 vaccine status: Declined, Education has been provided regarding the importance of this vaccine but patient still declined. Advised may receive this vaccine at local pharmacy or Health Dept.or vaccine clinic. Aware to provide a copy of the vaccination record if obtained from local pharmacy or Health Dept. Verbalized acceptance and understanding.  Qualifies for Shingles Vaccine? No   Zostavax completed Yes   Shingrix Completed?: Yes  Screening Tests Health Maintenance  Topic Date Due   COVID-19 Vaccine (  7 - 2023-24 season) 04/25/2023 (Originally 12/11/2022)   Medicare Annual Wellness (AWV)  01/03/2024   Pneumonia Vaccine 68+ Years old  Completed   INFLUENZA VACCINE  Completed   Hepatitis C Screening  Completed   Zoster Vaccines- Shingrix  Completed   HPV VACCINES  Aged Out   DTaP/Tdap/Td  Discontinued   Colonoscopy  Discontinued    Health Maintenance  There are no preventive care reminders to display for this patient.   Colorectal cancer screening: No longer required.   Lung Cancer Screening: (Low Dose CT Chest recommended if Age 63-80 years, 20 pack-year currently smoking OR have quit w/in 15years.) does not qualify.   Lung Cancer Screening Referral:   Additional Screening:  Hepatitis C Screening: does not qualify; Completed 2017  Vision Screening: Recommended annual ophthalmology exams for early detection of glaucoma and other  disorders of the eye. Is the patient up to date with their annual eye exam?  Yes  Who is the provider or what is the name of the office in which the patient attends annual eye exams? [facility] If pt is not established with a provider, would they like to be referred to a provider to establish care? No .   Dental Screening: Recommended annual dental exams for proper oral hygiene    Community Resource Referral / Chronic Care Management: CRR required this visit?  No   CCM required this visit?  No     Plan:     I have personally reviewed and noted the following in the patient's chart:   Medical and social history Use of alcohol, tobacco or illicit drugs  Current medications and supplements including opioid prescriptions. Patient is not currently taking opioid prescriptions. Functional ability and status Nutritional status Physical activity Advanced directives List of other physicians Hospitalizations, surgeries, and ER visits in previous 12 months Vitals Screenings to include cognitive, depression, and falls Referrals and appointments  In addition, I have reviewed and discussed with patient certain preventive protocols, quality metrics, and best practice recommendations. A written personalized care plan for preventive services as well as general preventive health recommendations were provided to patient.     Remi Haggard, LPN   1/61/0960   After Visit Summary: (MyChart) Due to this being a telephonic visit, the after visit summary with patients personalized plan was offered to patient via MyChart   Nurse Notes:

## 2023-01-16 ENCOUNTER — Inpatient Hospital Stay: Payer: Medicare HMO | Attending: Hematology and Oncology

## 2023-01-16 DIAGNOSIS — D472 Monoclonal gammopathy: Secondary | ICD-10-CM | POA: Insufficient documentation

## 2023-01-16 DIAGNOSIS — Z79899 Other long term (current) drug therapy: Secondary | ICD-10-CM | POA: Diagnosis not present

## 2023-01-16 LAB — CBC WITH DIFFERENTIAL (CANCER CENTER ONLY)
Abs Immature Granulocytes: 0.01 10*3/uL (ref 0.00–0.07)
Basophils Absolute: 0 10*3/uL (ref 0.0–0.1)
Basophils Relative: 0 %
Eosinophils Absolute: 0.1 10*3/uL (ref 0.0–0.5)
Eosinophils Relative: 1 %
HCT: 41.1 % (ref 39.0–52.0)
Hemoglobin: 14.4 g/dL (ref 13.0–17.0)
Immature Granulocytes: 0 %
Lymphocytes Relative: 39 %
Lymphs Abs: 2.3 10*3/uL (ref 0.7–4.0)
MCH: 34.6 pg — ABNORMAL HIGH (ref 26.0–34.0)
MCHC: 35 g/dL (ref 30.0–36.0)
MCV: 98.8 fL (ref 80.0–100.0)
Monocytes Absolute: 0.5 10*3/uL (ref 0.1–1.0)
Monocytes Relative: 9 %
Neutro Abs: 3 10*3/uL (ref 1.7–7.7)
Neutrophils Relative %: 51 %
Platelet Count: 278 10*3/uL (ref 150–400)
RBC: 4.16 MIL/uL — ABNORMAL LOW (ref 4.22–5.81)
RDW: 12.4 % (ref 11.5–15.5)
WBC Count: 5.9 10*3/uL (ref 4.0–10.5)
nRBC: 0 % (ref 0.0–0.2)

## 2023-01-16 LAB — CMP (CANCER CENTER ONLY)
ALT: 28 U/L (ref 0–44)
AST: 23 U/L (ref 15–41)
Albumin: 4.6 g/dL (ref 3.5–5.0)
Alkaline Phosphatase: 67 U/L (ref 38–126)
Anion gap: 7 (ref 5–15)
BUN: 11 mg/dL (ref 8–23)
CO2: 28 mmol/L (ref 22–32)
Calcium: 9.8 mg/dL (ref 8.9–10.3)
Chloride: 106 mmol/L (ref 98–111)
Creatinine: 1.02 mg/dL (ref 0.61–1.24)
GFR, Estimated: >60 mL/min (ref >60)
Glucose, Bld: 112 mg/dL — ABNORMAL HIGH (ref 70–99)
Potassium: 3.9 mmol/L (ref 3.5–5.1)
Sodium: 141 mmol/L (ref 135–145)
Total Bilirubin: 1.2 mg/dL (ref 0.3–1.2)
Total Protein: 7.9 g/dL (ref 6.5–8.1)

## 2023-01-17 LAB — KAPPA/LAMBDA LIGHT CHAINS
Kappa free light chain: 35.2 mg/L — ABNORMAL HIGH (ref 3.3–19.4)
Kappa, lambda light chain ratio: 2.71 — ABNORMAL HIGH (ref 0.26–1.65)
Lambda free light chains: 13 mg/L (ref 5.7–26.3)

## 2023-01-18 LAB — MULTIPLE MYELOMA PANEL, SERUM
Albumin SerPl Elph-Mcnc: 4.1 g/dL (ref 2.9–4.4)
Albumin/Glob SerPl: 1.3 (ref 0.7–1.7)
Alpha 1: 0.2 g/dL (ref 0.0–0.4)
Alpha2 Glob SerPl Elph-Mcnc: 0.6 g/dL (ref 0.4–1.0)
B-Globulin SerPl Elph-Mcnc: 1.3 g/dL (ref 0.7–1.3)
Gamma Glob SerPl Elph-Mcnc: 1.3 g/dL (ref 0.4–1.8)
Globulin, Total: 3.3 g/dL (ref 2.2–3.9)
IgA: 434 mg/dL (ref 61–437)
IgG (Immunoglobin G), Serum: 1367 mg/dL (ref 603–1613)
IgM (Immunoglobulin M), Srm: 27 mg/dL (ref 15–143)
M Protein SerPl Elph-Mcnc: 0.8 g/dL — ABNORMAL HIGH
Total Protein ELP: 7.4 g/dL (ref 6.0–8.5)

## 2023-01-23 ENCOUNTER — Ambulatory Visit (INDEPENDENT_AMBULATORY_CARE_PROVIDER_SITE_OTHER): Payer: Medicare HMO

## 2023-01-23 DIAGNOSIS — E538 Deficiency of other specified B group vitamins: Secondary | ICD-10-CM | POA: Diagnosis not present

## 2023-01-23 MED ORDER — CYANOCOBALAMIN 1000 MCG/ML IJ SOLN
1000.0000 ug | Freq: Once | INTRAMUSCULAR | Status: AC
Start: 2023-01-23 — End: 2023-01-23
  Administered 2023-01-23: 1000 ug via INTRAMUSCULAR

## 2023-01-23 NOTE — Progress Notes (Signed)
After obtaining consent, and per orders of Dr. Jonny Ruiz, injection of b-12 given by Ferdie Ping. Patient instructed to report any adverse reaction to me immediately.

## 2023-01-26 ENCOUNTER — Encounter: Payer: Self-pay | Admitting: Hematology and Oncology

## 2023-01-26 ENCOUNTER — Inpatient Hospital Stay: Payer: Medicare HMO | Admitting: Hematology and Oncology

## 2023-01-26 ENCOUNTER — Telehealth: Payer: Self-pay

## 2023-01-26 NOTE — Telephone Encounter (Signed)
Called and left a message regarding not showing up for appt today. Ask him to call the office to reschedule.

## 2023-01-30 DIAGNOSIS — R3915 Urgency of urination: Secondary | ICD-10-CM | POA: Diagnosis not present

## 2023-01-30 DIAGNOSIS — N5201 Erectile dysfunction due to arterial insufficiency: Secondary | ICD-10-CM | POA: Diagnosis not present

## 2023-01-30 DIAGNOSIS — N393 Stress incontinence (female) (male): Secondary | ICD-10-CM | POA: Diagnosis not present

## 2023-02-16 ENCOUNTER — Encounter: Payer: Self-pay | Admitting: Hematology and Oncology

## 2023-02-16 ENCOUNTER — Inpatient Hospital Stay: Payer: Medicare HMO | Attending: Hematology and Oncology | Admitting: Hematology and Oncology

## 2023-02-16 VITALS — BP 128/82 | HR 70 | Temp 98.2°F | Resp 18 | Ht 71.0 in | Wt 164.0 lb

## 2023-02-16 DIAGNOSIS — D472 Monoclonal gammopathy: Secondary | ICD-10-CM | POA: Insufficient documentation

## 2023-02-16 DIAGNOSIS — Z79899 Other long term (current) drug therapy: Secondary | ICD-10-CM | POA: Insufficient documentation

## 2023-02-16 NOTE — Progress Notes (Signed)
[facility] OFFICE PROGRESS NOTE  Patient Care Team: Corwin Levins, MD as PCP - General Milas Gain, MD as Attending Physician (Neurology) Julio Sicks, MD (Neurosurgery) Artis Delay, MD as Consulting Physician (Hematology and Oncology)  ASSESSMENT & PLAN:  MGUS (monoclonal gammopathy of unknown significance) Clinically, he has no signs of disease progression; we reviewed test results and he has no signs of anemia, hypercalcemia or renal failure Plan to see him next year in with repeat blood work, examination and blood work. We discussed natural history of MGUS We discussed importance of prophylactic vaccinations   Orders Placed This Encounter  Procedures   CBC with Differential (Cancer Center Only)    Standing Status:   Future    Standing Expiration Date:   02/16/2024   CMP (Cancer Center only)    Standing Status:   Future    Standing Expiration Date:   02/16/2024   Kappa/lambda light chains    Standing Status:   Standing    Number of Occurrences:   22    Standing Expiration Date:   02/16/2024   Multiple Myeloma Panel (SPEP&IFE w/QIG)    Standing Status:   Standing    Number of Occurrences:   22    Standing Expiration Date:   02/16/2024    All questions were answered. The patient knows to call the clinic with any problems, questions or concerns. The total time spent in the appointment was 20 minutes encounter with patients including review of chart and various tests results, discussions about plan of care and coordination of care plan   Artis Delay, MD 02/16/2023 9:29 AM  INTERVAL HISTORY: Please see below for problem oriented charting. he returns for surveillance follow-up for history of IgG kappa MGUS He is doing well No bone pain He remains active He had surgery earlier this year for urinary incontinence Denies recurrent infections He is up-to-date with his annual influenza vaccination  REVIEW OF SYSTEMS:   Constitutional: Denies fevers, chills or  abnormal weight loss Eyes: Denies blurriness of vision Ears, nose, mouth, throat, and face: Denies mucositis or sore throat Respiratory: Denies cough, dyspnea or wheezes Cardiovascular: Denies palpitation, chest discomfort or lower extremity swelling Gastrointestinal:  Denies nausea, heartburn or change in bowel habits Skin: Denies abnormal skin rashes Lymphatics: Denies new lymphadenopathy or easy bruising Neurological:Denies numbness, tingling or new weaknesses Behavioral/Psych: Mood is stable, no new changes  All other systems were reviewed with the patient and are negative.  I have reviewed the past medical history, past surgical history, social history and family history with the patient and they are unchanged from previous note.  ALLERGIES:  is allergic to nsaids.  MEDICATIONS:  Current Outpatient Medications  Medication Sig Dispense Refill   acetaminophen (TYLENOL) 500 MG tablet Take 2 tablets (1,000 mg total) by mouth every 6 (six) hours. 50 tablet 0   allopurinol (ZYLOPRIM) 300 MG tablet TAKE 1 TABLET BY MOUTH EVERY DAY 90 tablet 3   amLODipine (NORVASC) 5 MG tablet TAKE 1 TABLET (5 MG TOTAL) BY MOUTH DAILY. 90 tablet 3   Ascorbic Acid (VITAMIN C WITH ROSE HIPS) 1000 MG tablet Take 1 tablet (1,000 mg total) by mouth daily. (Patient taking differently: Take 2,000 mg by mouth daily.) 90 tablet 0   atorvastatin (LIPITOR) 40 MG tablet Take 1 tablet (40 mg total) by mouth daily. 90 tablet 3   Cholecalciferol (VITAMIN D) 50 MCG (2000 UT) tablet Take 4,000 Units by mouth daily.     Cyanocobalamin (B-12  COMPLIANCE INJECTION IJ) Inject 1 Dose as directed every 30 (thirty) days.     gabapentin (NEURONTIN) 300 MG capsule TAKE 2 CAPSULES BY MOUTH 3 TIMES A DAY (Patient taking differently: Take 600 mg by mouth 2 (two) times daily.) 90 capsule 5   oxyCODONE (OXY IR/ROXICODONE) 5 MG immediate release tablet Take 1 tablet (5 mg total) by mouth every 6 (six) hours as needed for severe pain. 20  tablet 0   pantoprazole (PROTONIX) 40 MG tablet TAKE 1 TABLET BY MOUTH EVERY DAY 90 tablet 2   PRESCRIPTION MEDICATION 1 Syringe by Intracavernosal route every other day as needed (10-30 minutes before you have sex for ED). Matrix (Papaverine 30mg  Phentolamine 1mg  Alprostadil per ml injection)  erectile dysfunction injection every other day as needed for sex     tiZANidine (ZANAFLEX) 4 MG tablet TAKE 1 TABLET BY MOUTH 3  TIMES DAILY AS NEEDED FOR  MUSCLE SPASM(S) 270 tablet 1   traMADol (ULTRAM) 50 MG tablet Take 1 tablet (50 mg total) by mouth every 6 (six) hours as needed. 30 tablet 0   Current Facility-Administered Medications  Medication Dose Route Frequency Provider Last Rate Last Admin   cyanocobalamin ((VITAMIN B-12)) injection 1,000 mcg  1,000 mcg Intramuscular Q30 days Corwin Levins, MD   1,000 mcg at 10/13/21 0102    SUMMARY OF ONCOLOGIC HISTORY: Oncology History   No history exists.    PHYSICAL EXAMINATION: ECOG PERFORMANCE STATUS: 0 - Asymptomatic  Vitals:   02/16/23 0854  BP: 128/82  Pulse: 70  Resp: 18  Temp: 98.2 F (36.8 C)  SpO2: 98%   Filed Weights   02/16/23 0854  Weight: 164 lb (74.4 kg)    GENERAL:alert, no distress and comfortable   LABORATORY DATA:  I have reviewed the data as listed    Component Value Date/Time   NA 141 01/16/2023 0936   NA 141 12/29/2016 1152   K 3.9 01/16/2023 0936   K 4.1 12/29/2016 1152   CL 106 01/16/2023 0936   CL 104 06/28/2012 1010   CO2 28 01/16/2023 0936   CO2 29 12/29/2016 1152   GLUCOSE 112 (H) 01/16/2023 0936   GLUCOSE 191 (H) 12/29/2016 1152   GLUCOSE 127 (H) 06/28/2012 1010   BUN 11 01/16/2023 0936   BUN 15.1 12/29/2016 1152   CREATININE 1.02 01/16/2023 0936   CREATININE 1.1 12/29/2016 1152   CALCIUM 9.8 01/16/2023 0936   CALCIUM 9.7 12/29/2016 1152   PROT 7.9 01/16/2023 0936   PROT 7.4 12/29/2016 1152   PROT 6.8 12/29/2016 1152   ALBUMIN 4.6 01/16/2023 0936   ALBUMIN 3.9 12/29/2016 1152   AST  23 01/16/2023 0936   AST 20 12/29/2016 1152   ALT 28 01/16/2023 0936   ALT 23 12/29/2016 1152   ALKPHOS 67 01/16/2023 0936   ALKPHOS 78 12/29/2016 1152   BILITOT 1.2 01/16/2023 0936   BILITOT 1.19 12/29/2016 1152   GFRNONAA >60 01/16/2023 0936   GFRAA >60 01/14/2019 1100    No results found for: "SPEP", "UPEP"  Lab Results  Component Value Date   WBC 5.9 01/16/2023   NEUTROABS 3.0 01/16/2023   HGB 14.4 01/16/2023   HCT 41.1 01/16/2023   MCV 98.8 01/16/2023   PLT 278 01/16/2023      Chemistry      Component Value Date/Time   NA 141 01/16/2023 0936   NA 141 12/29/2016 1152   K 3.9 01/16/2023 0936   K 4.1 12/29/2016 1152   CL 106  01/16/2023 0936   CL 104 06/28/2012 1010   CO2 28 01/16/2023 0936   CO2 29 12/29/2016 1152   BUN 11 01/16/2023 0936   BUN 15.1 12/29/2016 1152   CREATININE 1.02 01/16/2023 0936   CREATININE 1.1 12/29/2016 1152      Component Value Date/Time   CALCIUM 9.8 01/16/2023 0936   CALCIUM 9.7 12/29/2016 1152   ALKPHOS 67 01/16/2023 0936   ALKPHOS 78 12/29/2016 1152   AST 23 01/16/2023 0936   AST 20 12/29/2016 1152   ALT 28 01/16/2023 0936   ALT 23 12/29/2016 1152   BILITOT 1.2 01/16/2023 0936   BILITOT 1.19 12/29/2016 1152

## 2023-02-16 NOTE — Assessment & Plan Note (Signed)
Clinically, he has no signs of disease progression; we reviewed test results and he has no signs of anemia, hypercalcemia or renal failure Plan to see him next year in with repeat blood work, examination and blood work. We discussed natural history of MGUS We discussed importance of prophylactic vaccinations

## 2023-02-23 ENCOUNTER — Ambulatory Visit: Payer: Medicare HMO

## 2023-02-23 DIAGNOSIS — E538 Deficiency of other specified B group vitamins: Secondary | ICD-10-CM | POA: Diagnosis not present

## 2023-02-23 MED ORDER — CYANOCOBALAMIN 1000 MCG/ML IJ SOLN
1000.0000 ug | Freq: Once | INTRAMUSCULAR | Status: AC
  Administered 2023-02-23: 1000 ug via INTRAMUSCULAR

## 2023-02-23 NOTE — Progress Notes (Addendum)
PT visits today for their b-12 injection. PT informed of what they were receiving and tolerated injection well. PT told to reach out to office if needed.

## 2023-03-27 ENCOUNTER — Ambulatory Visit (INDEPENDENT_AMBULATORY_CARE_PROVIDER_SITE_OTHER): Payer: Medicare HMO

## 2023-03-27 DIAGNOSIS — E538 Deficiency of other specified B group vitamins: Secondary | ICD-10-CM | POA: Diagnosis not present

## 2023-03-27 MED ORDER — CYANOCOBALAMIN 1000 MCG/ML IJ SOLN
1000.0000 ug | Freq: Once | INTRAMUSCULAR | Status: AC
  Administered 2023-03-27: 1000 ug via INTRAMUSCULAR

## 2023-03-27 NOTE — Progress Notes (Signed)
After obtaining consent, and per orders of Dr. John, injection of B12 given by Haidynn Almendarez P Kasim Mccorkle. Patient instructed to report any adverse reaction to me immediately.  

## 2023-04-05 ENCOUNTER — Other Ambulatory Visit: Payer: Self-pay | Admitting: Internal Medicine

## 2023-04-27 ENCOUNTER — Ambulatory Visit (INDEPENDENT_AMBULATORY_CARE_PROVIDER_SITE_OTHER): Payer: Medicare HMO

## 2023-04-27 DIAGNOSIS — E538 Deficiency of other specified B group vitamins: Secondary | ICD-10-CM | POA: Diagnosis not present

## 2023-04-27 MED ORDER — CYANOCOBALAMIN 1000 MCG/ML IJ SOLN
1000.0000 ug | Freq: Once | INTRAMUSCULAR | Status: AC
  Administered 2023-04-27: 1000 ug via INTRAMUSCULAR

## 2023-04-27 NOTE — Progress Notes (Signed)
Patient visits today for their b-12 injection. Patient informed of what they had received and tolerated injection well. Patient notified to reach out to office if needed.

## 2023-05-04 DIAGNOSIS — N5201 Erectile dysfunction due to arterial insufficiency: Secondary | ICD-10-CM | POA: Diagnosis not present

## 2023-05-04 DIAGNOSIS — N3946 Mixed incontinence: Secondary | ICD-10-CM | POA: Diagnosis not present

## 2023-05-25 DIAGNOSIS — Z8546 Personal history of malignant neoplasm of prostate: Secondary | ICD-10-CM | POA: Diagnosis not present

## 2023-05-25 DIAGNOSIS — N3946 Mixed incontinence: Secondary | ICD-10-CM | POA: Diagnosis not present

## 2023-06-02 ENCOUNTER — Encounter: Payer: Medicare HMO | Admitting: Internal Medicine

## 2023-06-09 DIAGNOSIS — C61 Malignant neoplasm of prostate: Secondary | ICD-10-CM | POA: Diagnosis not present

## 2023-06-16 DIAGNOSIS — N393 Stress incontinence (female) (male): Secondary | ICD-10-CM | POA: Diagnosis not present

## 2023-06-16 DIAGNOSIS — N5201 Erectile dysfunction due to arterial insufficiency: Secondary | ICD-10-CM | POA: Diagnosis not present

## 2023-06-16 DIAGNOSIS — C61 Malignant neoplasm of prostate: Secondary | ICD-10-CM | POA: Diagnosis not present

## 2023-06-17 ENCOUNTER — Other Ambulatory Visit: Payer: Self-pay | Admitting: Internal Medicine

## 2023-06-19 ENCOUNTER — Other Ambulatory Visit: Payer: Self-pay

## 2023-06-23 DIAGNOSIS — N5201 Erectile dysfunction due to arterial insufficiency: Secondary | ICD-10-CM | POA: Diagnosis not present

## 2023-06-23 DIAGNOSIS — N393 Stress incontinence (female) (male): Secondary | ICD-10-CM | POA: Diagnosis not present

## 2023-06-26 ENCOUNTER — Other Ambulatory Visit: Payer: Self-pay

## 2023-06-26 ENCOUNTER — Other Ambulatory Visit: Payer: Self-pay | Admitting: Internal Medicine

## 2023-07-16 ENCOUNTER — Other Ambulatory Visit: Payer: Self-pay | Admitting: Internal Medicine

## 2023-07-19 ENCOUNTER — Ambulatory Visit (INDEPENDENT_AMBULATORY_CARE_PROVIDER_SITE_OTHER)

## 2023-07-19 DIAGNOSIS — E538 Deficiency of other specified B group vitamins: Secondary | ICD-10-CM | POA: Diagnosis not present

## 2023-07-19 MED ORDER — CYANOCOBALAMIN 1000 MCG/ML IJ SOLN
1000.0000 ug | Freq: Once | INTRAMUSCULAR | Status: AC
  Administered 2023-07-19: 1000 ug via INTRAMUSCULAR

## 2023-07-19 NOTE — Progress Notes (Signed)
 After obtaining consent, and per orders of Dr. Jonny Ruiz, injection of B12 given by Ferdie Ping. Patient instructed to report any adverse reaction to me immediately.

## 2023-07-20 ENCOUNTER — Other Ambulatory Visit: Payer: Self-pay | Admitting: Internal Medicine

## 2023-07-20 ENCOUNTER — Other Ambulatory Visit: Payer: Self-pay

## 2023-07-25 ENCOUNTER — Other Ambulatory Visit: Payer: Self-pay | Admitting: Internal Medicine

## 2023-08-13 ENCOUNTER — Other Ambulatory Visit: Payer: Self-pay | Admitting: Internal Medicine

## 2023-08-14 ENCOUNTER — Other Ambulatory Visit: Payer: Self-pay | Admitting: Urology

## 2023-08-14 ENCOUNTER — Other Ambulatory Visit: Payer: Self-pay

## 2023-08-16 ENCOUNTER — Encounter: Payer: Self-pay | Admitting: Internal Medicine

## 2023-08-16 ENCOUNTER — Ambulatory Visit: Admitting: Internal Medicine

## 2023-08-16 VITALS — BP 132/82 | HR 66 | Temp 97.7°F | Ht 70.0 in | Wt 179.0 lb

## 2023-08-16 DIAGNOSIS — E559 Vitamin D deficiency, unspecified: Secondary | ICD-10-CM | POA: Diagnosis not present

## 2023-08-16 DIAGNOSIS — M545 Low back pain, unspecified: Secondary | ICD-10-CM

## 2023-08-16 DIAGNOSIS — E538 Deficiency of other specified B group vitamins: Secondary | ICD-10-CM

## 2023-08-16 DIAGNOSIS — R7302 Impaired glucose tolerance (oral): Secondary | ICD-10-CM | POA: Diagnosis not present

## 2023-08-16 DIAGNOSIS — Z0001 Encounter for general adult medical examination with abnormal findings: Secondary | ICD-10-CM

## 2023-08-16 DIAGNOSIS — E7800 Pure hypercholesterolemia, unspecified: Secondary | ICD-10-CM

## 2023-08-16 DIAGNOSIS — I10 Essential (primary) hypertension: Secondary | ICD-10-CM

## 2023-08-16 DIAGNOSIS — Z0000 Encounter for general adult medical examination without abnormal findings: Secondary | ICD-10-CM | POA: Diagnosis not present

## 2023-08-16 MED ORDER — CYCLOBENZAPRINE HCL 5 MG PO TABS: 5.0000 mg | ORAL_TABLET | Freq: Three times a day (TID) | ORAL | 1 refills | Status: DC | PRN

## 2023-08-16 MED ORDER — HYDROCODONE-ACETAMINOPHEN 5-325 MG PO TABS: ORAL_TABLET | ORAL | 0 refills | Status: DC

## 2023-08-16 NOTE — Assessment & Plan Note (Signed)
Lab Results  Component Value Date   HGBA1C 6.3 05/26/2022   Stable, pt to continue current medical treatment  - diet, wt control

## 2023-08-16 NOTE — Assessment & Plan Note (Signed)
 Acute right sided , severe, without radicular symptoms - exam c/w severe MSK strain - for hydrocodon 5 325 limited rx prn use, and flexeril  5 tid prn,  to f/u any worsening symptoms or concerns

## 2023-08-16 NOTE — Assessment & Plan Note (Signed)
 BP Readings from Last 3 Encounters:  08/16/23 132/82  02/16/23 128/82  10/05/22 132/82   Stable, pt to continue medical treatment norvasc  5 qd

## 2023-08-16 NOTE — Assessment & Plan Note (Signed)

## 2023-08-16 NOTE — Assessment & Plan Note (Signed)
 Lab Results  Component Value Date   LDLCALC 98 05/26/2022   Stable, pt to continue current statin lipitor 40 mg every day, declines further lab today

## 2023-08-16 NOTE — Assessment & Plan Note (Signed)
Last vitamin D Lab Results  Component Value Date   VD25OH 24.61 (L) 05/26/2022   Low, to start oral replacement

## 2023-08-16 NOTE — Assessment & Plan Note (Signed)
 For IM 1000 mcg today

## 2023-08-16 NOTE — Patient Instructions (Signed)
 Please take all new medication as prescribed -the pain medication, and muscle relaxer as needed  Please continue all other medications as before, and refills have been done if requested.  Please have the pharmacy call with any other refills you may need.  Please continue your efforts at being more active, low cholesterol diet, and weight control.  You are otherwise up to date with prevention measures today.  Please keep your appointments with your specialists as you may have planned  We can hold on further lab today  Please make an Appointment to return in 6 months, or sooner if needed

## 2023-08-16 NOTE — Progress Notes (Signed)
 Patient ID: Nathan Wallace, male   DOB: 1950/08/24, 73 y.o.   MRN: [id number]         Chief Complaint:: wellness exam and low b12, low vit d, low back pain       HPI:  Nathan Wallace is a 73 y.o. male here for wellness exam; up to date  Declines further labs today                        Also was moving a refrigerator with a hand truck, now with acute onset severe right severe pain , intermittent but quite frequent, but fortuantely without LE symptoms. or bowel or bladder change, fever, wt loss,  worsening LE pain/numbness/weakness, gait change or falls. Worse to stand up and touch the area.  Pt denies chest pain, increased sob or doe, wheezing, orthopnea, PND, increased LE swelling, palpitations, dizziness or syncope.   Pt denies polydipsia, polyuria, or new focal neuro s/s.   Has hx of lumbar DDD and DJD by MRI 2003.   Wt Readings from Last 3 Encounters:  08/16/23 179 lb (81.2 kg)  02/16/23 164 lb (74.4 kg)  10/04/22 163 lb (73.9 kg)   BP Readings from Last 3 Encounters:  08/16/23 132/82  02/16/23 128/82  10/05/22 132/82   Immunization History  Administered Date(s) Administered   Fluad Quad(high Dose 65+) 12/06/2018, 01/06/2020, 01/21/2021, 05/19/2022   Fluad Trivalent(High Dose 65+) 12/23/2022   Influenza Split 12/19/2011   Influenza, High Dose Seasonal PF 12/25/2015, 02/02/2017, 01/05/2018   Influenza,inj,Quad PF,6+ Mos 02/28/2013, 04/09/2014, 01/05/2015   PFIZER Comirnaty(Gray Top)Covid-19 Tri-Sucrose Vaccine 07/30/2020   PFIZER(Purple Top)SARS-COV-2 Vaccination 05/24/2019, 06/18/2019, 01/21/2020   Pfizer Covid-19 Vaccine Bivalent Booster 44yrs & up 03/03/2021   Pneumococcal Conjugate-13 05/18/2017   Pneumococcal Polysaccharide-23 05/22/2018   Td 08/26/2008   Tdap 02/23/2012   Unspecified SARS-COV-2 Vaccination 02/02/2022   Zoster Recombinant(Shingrix ) 12/15/2021, 05/31/2022   Zoster, Live 07/26/2012   There are no preventive care reminders to display for this patient.      Past Medical History:  Diagnosis Date   AKI (acute kidney injury) (HCC)    Arthritis    RT KNEE AND RT SHOULDER   B12 deficiency    Benign head tremor    Chest tightness    comes and goes    CVA (cerebrovascular accident due to intracerebral hemorrhage) (HCC) 08/03/2011   Degenerative joint disease of knee, right    Depression 07/26/2012   no problems now   Diverticulosis    GERD (gastroesophageal reflux disease)    NO RECENT PROBLEMS AND NO MEDS   History of blood transfusion    History of kidney stones    History of urinary tract infection    Hyperlipidemia    border line   Hypertension    Impaired glucose tolerance 06/22/2011   Left inguinal hernia    Macrocytic anemia    MGUS (monoclonal gammopathy of unknown significance)    Prostate cancer (HCC) 2011   Rotator cuff tear arthropathy of right shoulder, with instability 10/25/2016   Sepsis (HCC)    Stress incontinence    Stroke [facility])    2013   Syncope 2012   after prostate surgery; neg neurology work up.    Tinnitus of right ear 12/08/2021   [place]ming, non-pulsatile     Past Surgical History:  Procedure Laterality Date   COLONOSCOPY     INGUINAL HERNIA REPAIR  06/01/2011   Procedure: HERNIA REPAIR INGUINAL ADULT;  Surgeon: Alvia Awkward  Lauralee Poll, MD;  Location: [facility];  Service: General;  Laterality: Left;  Left Inguinal Hernia Repair with Mesh   ROBOT ASSISTED LAPAROSCOPIC RADICAL PROSTATECTOMY  jan 2012   da vinci   SPINE SURGERY  2003 OR 2004   CERVICAL FUSION-STATES LIMITED ROM NECK   TOTAL SHOULDER ARTHROPLASTY Right 10/25/2016   TOTAL SHOULDER ARTHROPLASTY Right 10/25/2016   Procedure: RIGHT TOTAL SHOULDER ARTHROPLASTY;  Surgeon: Osa Blase, MD;  Location: [facility] OR;  Service: Orthopedics;  Laterality: Right;   URETHRAL SLING N/A 03/08/2016   Procedure: MALE SLING CYSTOSCOPY;  Surgeon: Erman Hayward, MD;  Location: [facility];  Service: Urology;  Laterality: N/A;   URINARY SPHINCTER IMPLANT N/A 10/04/2022    Procedure: ARTIFICIAL URINARY SPHINCTER;  Surgeon: Mallie Seal, MD;  Location: [facility];  Service: Urology;  Laterality: N/A;  120 MINUTES NEEDED FOR CASE   WRIST SURGERY     right     reports that he quit smoking about 57 years ago. His smoking use included cigarettes. He started smoking about 58 years ago. He has a 0.5 pack-year smoking history. He has never used smokeless tobacco. He reports current alcohol use. He reports that he does not currently use drugs after having used the following drugs: Marijuana. family history includes Aneurysm in his mother; Colon cancer (age of onset: 51) in his father; Heart attack in his brother; Hypertension in his mother. Allergies  Allergen Reactions   Nsaids Other (See Comments)    Gastric ulcer   Current Outpatient Medications on File Prior to Visit  Medication Sig Dispense Refill   acetaminophen  (TYLENOL ) 500 MG tablet Take 2 tablets (1,000 mg total) by mouth every 6 (six) hours. 50 tablet 0   allopurinol  (ZYLOPRIM ) 300 MG tablet TAKE 1 TABLET BY MOUTH EVERY DAY 30 tablet 0   amLODipine  (NORVASC ) 5 MG tablet TAKE 1 TABLET (5 MG TOTAL) BY MOUTH DAILY. 90 tablet 3   Ascorbic Acid  (VITAMIN C  WITH ROSE HIPS) 1000 MG tablet Take 1 tablet (1,000 mg total) by mouth daily. (Patient taking differently: Take 2,000 mg by mouth daily.) 90 tablet 0   atorvastatin  (LIPITOR) 40 MG tablet Take 1 tablet (40 mg total) by mouth daily. 90 tablet 3   Cholecalciferol  (VITAMIN D ) 50 MCG (2000 UT) tablet Take 4,000 Units by mouth daily.     Cyanocobalamin  (B-12 COMPLIANCE INJECTION IJ) Inject 1 Dose as directed every 30 (thirty) days.     gabapentin  (NEURONTIN ) 300 MG capsule TAKE 2 CAPSULES BY MOUTH 3 TIMES A DAY 90 capsule 5   pantoprazole  (PROTONIX ) 40 MG tablet TAKE 1 TABLET BY MOUTH EVERY DAY 30 tablet 0   PRESCRIPTION MEDICATION 1 Syringe by Intracavernosal route every other day as needed (10-30 minutes before you have sex for ED). Matrix (Papaverine 30mg   Phentolamine 1mg  Alprostadil 40mcg per ml injection)  erectile dysfunction injection every other day as needed for sex     oxyCODONE  (OXY IR/ROXICODONE ) 5 MG immediate release tablet Take 1 tablet (5 mg total) by mouth every 6 (six) hours as needed for severe pain. (Patient not taking: Reported on 08/16/2023) 20 tablet 0   tiZANidine  (ZANAFLEX ) 4 MG tablet TAKE 1 TABLET BY MOUTH 3  TIMES DAILY AS NEEDED FOR  MUSCLE SPASM(S) (Patient not taking: Reported on 08/16/2023) 270 tablet 1   traMADol  (ULTRAM ) 50 MG tablet Take 1 tablet (50 mg total) by mouth every 6 (six) hours as needed. (Patient not taking: Reported on 08/16/2023) 30 tablet 0   Current Facility-Administered Medications  on File Prior to Visit  Medication Dose Route Frequency Provider Last Rate Last Admin   cyanocobalamin  ((VITAMIN B-12)) injection 1,000 mcg  1,000 mcg Intramuscular Q30 days Roslyn Coombe, MD   1,000 mcg at 10/13/21 0835        ROS:  All others reviewed and negative.  Objective        PE:  BP 132/82   Pulse 66   Temp 97.7 F (36.5 C) (Oral)   Ht 5\' 10"  (1.778 m)   Wt 179 lb (81.2 kg)   SpO2 99%   BMI 25.68 kg/m                 Constitutional: Pt appears in NAD               HENT: Head: NCAT.                Right Ear: External ear normal.                 Left Ear: External ear normal.                Eyes: . Pupils are equal, round, and reactive to light. Conjunctivae and EOM are normal               Nose: without d/c or deformity               Neck: Neck supple. Gross normal ROM               Cardiovascular: Normal rate and regular rhythm.                 Pulmonary/Chest: Effort normal and breath sounds without rales or wheezing.                Abd:  Soft, NT, ND, + BS, no organomegaly               Spine non tender in midline; has severe right > left lumbar paraspinal tender spasm               Neurological: Pt is alert. At baseline orientation, motor grossly intact               Skin: Skin is warm. No rashes, no  other new lesions, LE edema - none               Psychiatric: Pt behavior is normal without agitation   Micro: none  Cardiac tracings I have personally interpreted today:  none  Pertinent Radiological findings (summarize): none   Lab Results  Component Value Date   WBC 5.9 01/16/2023   HGB 14.4 01/16/2023   HCT 41.1 01/16/2023   PLT 278 01/16/2023   GLUCOSE 112 (H) 01/16/2023   CHOL 176 05/26/2022   TRIG 85.0 05/26/2022   HDL 61.20 05/26/2022   LDLDIRECT 87.9 12/19/2011   LDLCALC 98 05/26/2022   ALT 28 01/16/2023   AST 23 01/16/2023   NA 141 01/16/2023   K 3.9 01/16/2023   CL 106 01/16/2023   CREATININE 1.02 01/16/2023   BUN 11 01/16/2023   CO2 28 01/16/2023   TSH 2.40 05/26/2022   PSA 0.13 05/26/2022   INR 1.12 03/02/2016   HGBA1C 6.3 05/26/2022   Assessment/Plan:  Nathan Wallace is a 73 y.o. Black or African American [2] male with  has a past medical history of AKI (acute kidney injury) (HCC), Arthritis, B12 deficiency, Benign head tremor, Chest tightness, CVA (cerebrovascular  accident due to intracerebral hemorrhage) (HCC) (08/03/2011), Degenerative joint disease of knee, right, Depression (07/26/2012), Diverticulosis, GERD (gastroesophageal reflux disease), History of blood transfusion, History of kidney stones, History of urinary tract infection, Hyperlipidemia, Hypertension, Impaired glucose tolerance (06/22/2011), Left inguinal hernia, Macrocytic anemia, MGUS (monoclonal gammopathy of unknown significance), Prostate cancer (HCC) (2011), Rotator cuff tear arthropathy of right shoulder, with instability (10/25/2016), Sepsis (HCC), Stress incontinence, Stroke [facility]), Syncope (2012), and Tinnitus of right ear (12/08/2021).  Encounter for well adult exam with abnormal findings Age and sex appropriate education and counseling updated with regular exercise and diet Referrals for preventative services - none needed Immunizations addressed - none needed Smoking counseling  -  none needed Evidence for depression or other mood disorder - none significant Most recent labs reviewed. I have personally reviewed and have noted: 1) the patient's medical and social history 2) The patient's current medications and supplements 3) The patient's height, weight, and BMI have been recorded in the chart   LOW BACK PAIN Acute right sided , severe, without radicular symptoms - exam c/w severe MSK strain - for hydrocodon 5 325 limited rx prn use, and flexeril  5 tid prn,  to f/u any worsening symptoms or concerns  Hyperlipidemia Lab Results  Component Value Date   LDLCALC 98 05/26/2022   Stable, pt to continue current statin lipitor 40 mg every day, declines further lab today   Impaired glucose tolerance Lab Results  Component Value Date   HGBA1C 6.3 05/26/2022   Stable, pt to continue current medical treatment  - diet, wt control   HTN (hypertension) BP Readings from Last 3 Encounters:  08/16/23 132/82  02/16/23 128/82  10/05/22 132/82   Stable, pt to continue medical treatment norvasc  5 qd   B12 deficiency For IM 1000 mcg today  Vitamin D  deficiency Last vitamin D  Lab Results  Component Value Date   VD25OH 24.61 (L) 05/26/2022   Low, to start oral replacement  Followup: Return in about 6 months (around 02/16/2024).  Rosalia Colonel, MD 08/16/2023 12:54 PM [facility]  Primary Care - [facility] Internal Medicine

## 2023-08-17 DIAGNOSIS — M545 Low back pain, unspecified: Secondary | ICD-10-CM | POA: Diagnosis not present

## 2023-08-17 DIAGNOSIS — E538 Deficiency of other specified B group vitamins: Secondary | ICD-10-CM | POA: Diagnosis not present

## 2023-08-17 DIAGNOSIS — E7800 Pure hypercholesterolemia, unspecified: Secondary | ICD-10-CM | POA: Diagnosis not present

## 2023-08-17 DIAGNOSIS — Z0000 Encounter for general adult medical examination without abnormal findings: Secondary | ICD-10-CM | POA: Diagnosis not present

## 2023-08-17 DIAGNOSIS — R7302 Impaired glucose tolerance (oral): Secondary | ICD-10-CM | POA: Diagnosis not present

## 2023-08-17 DIAGNOSIS — E559 Vitamin D deficiency, unspecified: Secondary | ICD-10-CM | POA: Diagnosis not present

## 2023-08-17 DIAGNOSIS — I10 Essential (primary) hypertension: Secondary | ICD-10-CM | POA: Diagnosis not present

## 2023-08-17 MED ORDER — CYANOCOBALAMIN 1000 MCG/ML IJ SOLN
1000.0000 ug | Freq: Once | INTRAMUSCULAR | Status: AC
  Administered 2023-08-17: 1000 ug via INTRAMUSCULAR

## 2023-08-17 NOTE — Addendum Note (Signed)
 Addended by: Arch Beans, Jalisa Sacco P on: 08/17/2023 01:30 PM   Modules accepted: Orders

## 2023-08-18 ENCOUNTER — Ambulatory Visit

## 2023-08-23 ENCOUNTER — Other Ambulatory Visit: Payer: Self-pay | Admitting: Internal Medicine

## 2023-08-23 ENCOUNTER — Other Ambulatory Visit: Payer: Self-pay

## 2023-08-23 ENCOUNTER — Ambulatory Visit: Payer: Self-pay

## 2023-08-23 NOTE — Telephone Encounter (Signed)
  Chief Complaint: tick bite Symptoms: red skin around bite site, pain, itching Frequency: constant Pertinent Negatives: Patient denies fever, rash, body aches Disposition: [] ED /[] Urgent Care (no appt availability in office) / [x] Appointment(In office/virtual)/ []  [place] [facility]/ [] Home Care/ [] Refused Recommended Disposition /[]  Mobile Bus/ []  Follow-up with PCP Additional Notes:  Friday 08/18/23 pulled engorged tick from under arm, he used a needle. He now has swelling, itching and irritation surrounding site. Using rubbing alcohol to disinfect. Advised cleanse with soap and water , apply otc antibiotic ointment 3 times per day. Acute evaluation scheduled with an alternate provider on 08/24/23. Educated on care advice as documented in protocol, patient verbalized understanding.    Copied from CRM [id number]. Topic: Clinical - Red Word Triage >> Aug 23, 2023  3:02 PM Nathan Wallace wrote: Red Word that prompted transfer to Nurse Triage: Patient stated he got a tick bite last Friday and looking to be seen by a doctor. Reason for Disposition  [1] Red or very tender (to touch) area AND [2] started over 24 hours after the bite  Protocols used: Tick Bite-A-AH

## 2023-08-24 ENCOUNTER — Other Ambulatory Visit: Payer: Self-pay | Admitting: Internal Medicine

## 2023-08-24 ENCOUNTER — Ambulatory Visit: Admitting: Nurse Practitioner

## 2023-08-24 VITALS — BP 118/80 | HR 84 | Temp 98.4°F | Ht 70.0 in | Wt 164.4 lb

## 2023-08-24 DIAGNOSIS — W57XXXA Bitten or stung by nonvenomous insect and other nonvenomous arthropods, initial encounter: Secondary | ICD-10-CM | POA: Insufficient documentation

## 2023-08-24 DIAGNOSIS — W57XXXS Bitten or stung by nonvenomous insect and other nonvenomous arthropods, sequela: Secondary | ICD-10-CM | POA: Diagnosis not present

## 2023-08-24 DIAGNOSIS — S40869A Insect bite (nonvenomous) of unspecified upper arm, initial encounter: Secondary | ICD-10-CM

## 2023-08-24 MED ORDER — DOXYCYCLINE HYCLATE 100 MG PO TABS: 100.0000 mg | ORAL_TABLET | Freq: Two times a day (BID) | ORAL | 0 refills | Status: DC

## 2023-08-24 NOTE — Assessment & Plan Note (Signed)
 Acute Patient does have evidence of localized infection around the site of the tick was found out. Treat with doxycycline  100 mg twice daily x 10 days. Patient educated on potential side effects especially risk of C. difficile infection.  Patient educated on when to call office. Follow-up if symptoms persist or worsen.

## 2023-08-24 NOTE — Progress Notes (Signed)
   Established Patient Office Visit  Subjective   Patient ID: Nathan Wallace, male    DOB: 04/22/1950  Age: 73 y.o. MRN: [id number]  Chief Complaint  Patient presents with   Tick Removal    Tick bite, patient has already removed tick at home    Tick bite: Patient reports he was working outside 6 days ago, then 5 days ago he felt something itching under his armpit.  When he went to investigate he found an engorged tick.  He removed the tick himself.  Area continued to itch so he has been scratching it here and there.  He also reports taking a needle to where the tick was located tried to get all the way out.  Area around the site is red and swollen, he has been applying antibiotic ointment to the area.  No fever or chills.  He was not able to identify the type of tick.        Objective:     BP 118/80   Pulse 84   Temp 98.4 F (36.9 C) (Temporal)   Ht 5\' 10"  (1.778 m)   Wt 164 lb 6 oz (74.6 kg)   BMI 23.59 kg/m    Physical Exam Constitutional:      General: He is not in acute distress. HENT:     Head: Normocephalic and atraumatic.  Cardiovascular:     Rate and Rhythm: Normal rate and regular rhythm.  Pulmonary:     Effort: Pulmonary effort is normal.  Skin:    General: Skin is warm and dry.       Neurological:     Mental Status: He is alert.      No results found for any visits on 08/24/23.    The ASCVD Risk score (Arnett DK, et al., 2019) failed to calculate for the following reasons:   Risk score cannot be calculated because patient has a medical history suggesting prior/existing ASCVD    Assessment & Plan:   Problem List Items Addressed This Visit       Musculoskeletal and Integument   Tick bite - Primary   Acute Patient does have evidence of localized infection around the site of the tick was found out. Treat with doxycycline  100 mg twice daily x 10 days. Patient educated on potential side effects especially risk of C. difficile infection.   Patient educated on when to call office. Follow-up if symptoms persist or worsen.      Relevant Medications   doxycycline  (VIBRA -TABS) 100 MG tablet    Return if symptoms worsen or fail to improve.    Zorita Hiss, NP

## 2023-09-05 ENCOUNTER — Other Ambulatory Visit: Payer: Self-pay

## 2023-09-05 ENCOUNTER — Encounter (HOSPITAL_BASED_OUTPATIENT_CLINIC_OR_DEPARTMENT_OTHER): Payer: Self-pay | Admitting: Urology

## 2023-09-05 DIAGNOSIS — N393 Stress incontinence (female) (male): Secondary | ICD-10-CM | POA: Diagnosis not present

## 2023-09-12 NOTE — Anesthesia Preprocedure Evaluation (Signed)
 Anesthesia Evaluation  Patient identified by MRN, date of birth, ID band Patient awake    Reviewed: Allergy & Precautions, NPO status , Patient's Chart, lab work & pertinent test results  History of Anesthesia Complications Negative for: history of anesthetic complications  Airway Mallampati: II  TM Distance: >3 FB Neck ROM: Full    Dental no notable dental hx.    Pulmonary former smoker   Pulmonary exam normal        Cardiovascular hypertension, Pt. on medications Normal cardiovascular exam     Neuro/Psych   Anxiety Depression    CVA (2013)    GI/Hepatic Neg liver ROS,GERD  Medicated and Controlled,,  Endo/Other  negative endocrine ROS    Renal/GU negative Renal ROS     Musculoskeletal  (+) Arthritis ,    Abdominal   Peds  Hematology negative hematology ROS (+)   Anesthesia Other Findings Erectile dysfunction due to arterial insufficiency  Reproductive/Obstetrics                              Anesthesia Physical Anesthesia Plan  ASA: 3  Anesthesia Plan: General   Post-op Pain Management: Tylenol  PO (pre-op)*   Induction: Intravenous  PONV Risk Score and Plan: 2 and Treatment may vary due to age or medical condition, Ondansetron , Dexamethasone  and Midazolam   Airway Management Planned: LMA  Additional Equipment: None  Intra-op Plan:   Post-operative Plan: Extubation in OR  Informed Consent: I have reviewed the patients History and Physical, chart, labs and discussed the procedure including the risks, benefits and alternatives for the proposed anesthesia with the patient or authorized representative who has indicated his/her understanding and acceptance.     Dental advisory given  Plan Discussed with: CRNA  Anesthesia Plan Comments:         Anesthesia Quick Evaluation

## 2023-09-13 ENCOUNTER — Encounter (HOSPITAL_COMMUNITY): Payer: Self-pay

## 2023-09-13 ENCOUNTER — Ambulatory Visit (HOSPITAL_BASED_OUTPATIENT_CLINIC_OR_DEPARTMENT_OTHER)
Admission: RE | Admit: 2023-09-13 | Discharge: 2023-09-13 | Disposition: A | Discharge status: Home / Self Care | Source: Ambulatory Visit | Attending: Urology | Admitting: Urology

## 2023-09-13 ENCOUNTER — Encounter (HOSPITAL_BASED_OUTPATIENT_CLINIC_OR_DEPARTMENT_OTHER)
Admission: RE | Disposition: A | Discharge status: Home / Self Care | Payer: Self-pay | Source: Ambulatory Visit | Attending: Urology

## 2023-09-13 ENCOUNTER — Emergency Department (HOSPITAL_COMMUNITY)
Admission: EM | Admit: 2023-09-13 | Discharge: 2023-09-13 | Disposition: A | Discharge status: Home / Self Care | Source: Home / Self Care | Attending: Emergency Medicine | Admitting: Emergency Medicine

## 2023-09-13 ENCOUNTER — Ambulatory Visit (HOSPITAL_BASED_OUTPATIENT_CLINIC_OR_DEPARTMENT_OTHER): Payer: Self-pay | Admitting: Anesthesiology

## 2023-09-13 ENCOUNTER — Encounter (HOSPITAL_BASED_OUTPATIENT_CLINIC_OR_DEPARTMENT_OTHER): Payer: Self-pay | Admitting: Urology

## 2023-09-13 ENCOUNTER — Other Ambulatory Visit: Payer: Self-pay

## 2023-09-13 DIAGNOSIS — F418 Other specified anxiety disorders: Secondary | ICD-10-CM | POA: Diagnosis not present

## 2023-09-13 DIAGNOSIS — N5201 Erectile dysfunction due to arterial insufficiency: Secondary | ICD-10-CM | POA: Insufficient documentation

## 2023-09-13 DIAGNOSIS — I10 Essential (primary) hypertension: Secondary | ICD-10-CM | POA: Insufficient documentation

## 2023-09-13 DIAGNOSIS — Z01818 Encounter for other preprocedural examination: Secondary | ICD-10-CM

## 2023-09-13 DIAGNOSIS — K219 Gastro-esophageal reflux disease without esophagitis: Secondary | ICD-10-CM | POA: Diagnosis not present

## 2023-09-13 DIAGNOSIS — N529 Male erectile dysfunction, unspecified: Secondary | ICD-10-CM

## 2023-09-13 DIAGNOSIS — M1711 Unilateral primary osteoarthritis, right knee: Secondary | ICD-10-CM | POA: Diagnosis not present

## 2023-09-13 DIAGNOSIS — Z87891 Personal history of nicotine dependence: Secondary | ICD-10-CM

## 2023-09-13 DIAGNOSIS — Z8249 Family history of ischemic heart disease and other diseases of the circulatory system: Secondary | ICD-10-CM | POA: Insufficient documentation

## 2023-09-13 DIAGNOSIS — Z8673 Personal history of transient ischemic attack (TIA), and cerebral infarction without residual deficits: Secondary | ICD-10-CM | POA: Insufficient documentation

## 2023-09-13 DIAGNOSIS — R339 Retention of urine, unspecified: Secondary | ICD-10-CM

## 2023-09-13 DIAGNOSIS — Z79899 Other long term (current) drug therapy: Secondary | ICD-10-CM | POA: Diagnosis not present

## 2023-09-13 DIAGNOSIS — Q5561 Curvature of penis (lateral): Secondary | ICD-10-CM | POA: Diagnosis not present

## 2023-09-13 HISTORY — PX: PENILE PROSTHESIS IMPLANT: SHX240

## 2023-09-13 SURGERY — INSERTION, PENILE PROSTHESIS
Anesthesia: General | Site: Penis

## 2023-09-13 MED ORDER — SODIUM CHLORIDE 0.9 % IR SOLN
Status: DC | PRN
  Administered 2023-09-13: 1000 mL

## 2023-09-13 MED ORDER — CHLORHEXIDINE GLUCONATE 4 % EX SOLN: Freq: Once | CUTANEOUS | Status: DC

## 2023-09-13 MED ORDER — SULFAMETHOXAZOLE-TRIMETHOPRIM 800-160 MG PO TABS: 1.0000 | ORAL_TABLET | Freq: Two times a day (BID) | ORAL | 0 refills | Status: DC

## 2023-09-13 MED ORDER — FENTANYL CITRATE (PF) 100 MCG/2ML IJ SOLN
INTRAMUSCULAR | Status: DC | PRN
  Administered 2023-09-13: 50 ug via INTRAVENOUS

## 2023-09-13 MED ORDER — FENTANYL CITRATE (PF) 100 MCG/2ML IJ SOLN
INTRAMUSCULAR | Status: AC
  Filled 2023-09-13: qty 2

## 2023-09-13 MED ORDER — ACETAMINOPHEN 500 MG PO TABS
1000.0000 mg | ORAL_TABLET | Freq: Once | ORAL | Status: AC
  Administered 2023-09-13: 1000 mg via ORAL

## 2023-09-13 MED ORDER — DEXAMETHASONE SODIUM PHOSPHATE 4 MG/ML IJ SOLN
INTRAMUSCULAR | Status: DC | PRN
  Administered 2023-09-13: 5 mg via INTRAVENOUS

## 2023-09-13 MED ORDER — GENTAMICIN SULFATE 40 MG/ML IJ SOLN
5.0000 mg/kg | INTRAVENOUS | Status: AC
  Administered 2023-09-13: 380 mg via INTRAVENOUS
  Filled 2023-09-13: qty 9.5

## 2023-09-13 MED ORDER — PHENYLEPHRINE HCL-NACL 20-0.9 MG/250ML-% IV SOLN
INTRAVENOUS | Status: DC | PRN
Start: 2023-09-13 — End: 2023-09-13
  Administered 2023-09-13 (×2): 40 ug/min via INTRAVENOUS

## 2023-09-13 MED ORDER — FENTANYL CITRATE (PF) 100 MCG/2ML IJ SOLN
25.0000 ug | INTRAMUSCULAR | Status: DC | PRN
  Administered 2023-09-13 (×2): 50 ug via INTRAVENOUS

## 2023-09-13 MED ORDER — PROPOFOL 500 MG/50ML IV EMUL
INTRAVENOUS | Status: AC
  Filled 2023-09-13: qty 50

## 2023-09-13 MED ORDER — OXYCODONE HCL 5 MG/5ML PO SOLN: 5.0000 mg | Freq: Once | ORAL | Status: AC | PRN

## 2023-09-13 MED ORDER — PROPOFOL 10 MG/ML IV BOLUS
INTRAVENOUS | Status: DC | PRN
  Administered 2023-09-13: 30 mg via INTRAVENOUS
  Administered 2023-09-13: 150 mg via INTRAVENOUS

## 2023-09-13 MED ORDER — MIDAZOLAM HCL 2 MG/2ML IJ SOLN
INTRAMUSCULAR | Status: AC
  Filled 2023-09-13: qty 2

## 2023-09-13 MED ORDER — ONDANSETRON HCL 4 MG/2ML IJ SOLN
INTRAMUSCULAR | Status: DC | PRN
  Administered 2023-09-13: 4 mg via INTRAVENOUS

## 2023-09-13 MED ORDER — ACETAMINOPHEN 500 MG PO TABS
ORAL_TABLET | ORAL | Status: AC
  Filled 2023-09-13: qty 2

## 2023-09-13 MED ORDER — OXYCODONE HCL 5 MG PO TABS
ORAL_TABLET | ORAL | Status: AC
Start: 2023-09-13 — End: ?
  Filled 2023-09-13: qty 1

## 2023-09-13 MED ORDER — HEMOSTATIC AGENTS (NO CHARGE) OPTIME
TOPICAL | Status: DC | PRN
  Administered 2023-09-13: 1 via TOPICAL

## 2023-09-13 MED ORDER — LACTATED RINGERS IV SOLN: INTRAVENOUS | Status: DC

## 2023-09-13 MED ORDER — DEXMEDETOMIDINE HCL IN NACL 80 MCG/20ML IV SOLN
INTRAVENOUS | Status: AC
  Filled 2023-09-13: qty 40

## 2023-09-13 MED ORDER — VANCOMYCIN HCL 1500 MG/300ML IV SOLN
1500.0000 mg | INTRAVENOUS | Status: AC
  Administered 2023-09-13: 1500 mg via INTRAVENOUS
  Filled 2023-09-13: qty 300

## 2023-09-13 MED ORDER — EPHEDRINE SULFATE (PRESSORS) 50 MG/ML IJ SOLN
INTRAMUSCULAR | Status: DC | PRN
  Administered 2023-09-13 (×2): 10 mg via INTRAVENOUS
  Administered 2023-09-13: 5 mg via INTRAVENOUS

## 2023-09-13 MED ORDER — OXYCODONE HCL 5 MG PO TABS
5.0000 mg | ORAL_TABLET | Freq: Once | ORAL | Status: AC | PRN
  Administered 2023-09-13: 5 mg via ORAL

## 2023-09-13 MED ORDER — TRANEXAMIC ACID 650 MG PO TABS: 1300.0000 mg | ORAL_TABLET | Freq: Once | ORAL | Status: DC

## 2023-09-13 MED ORDER — PHENYLEPHRINE HCL (PRESSORS) 10 MG/ML IV SOLN
INTRAVENOUS | Status: DC | PRN
  Administered 2023-09-13 (×3): 80 ug via INTRAVENOUS

## 2023-09-13 MED ORDER — FLUCONAZOLE IN SODIUM CHLORIDE 200-0.9 MG/100ML-% IV SOLN
200.0000 mg | INTRAVENOUS | Status: DC
  Administered 2023-09-13: 200 mg via INTRAVENOUS
  Filled 2023-09-13: qty 100

## 2023-09-13 MED ORDER — MIDAZOLAM HCL 2 MG/2ML IJ SOLN
INTRAMUSCULAR | Status: DC | PRN
  Administered 2023-09-13: 1 mg via INTRAVENOUS

## 2023-09-13 MED ORDER — LIDOCAINE HCL (PF) 1 % IJ SOLN
INTRAMUSCULAR | Status: DC | PRN
  Administered 2023-09-13: 26 mL

## 2023-09-13 MED ORDER — DROPERIDOL 2.5 MG/ML IJ SOLN: 0.6250 mg | Freq: Once | INTRAMUSCULAR | Status: DC | PRN

## 2023-09-13 MED ORDER — ACETAMINOPHEN 500 MG PO TABS: 1000.0000 mg | ORAL_TABLET | Freq: Three times a day (TID) | ORAL | 0 refills | Status: AC

## 2023-09-13 MED ORDER — LIDOCAINE HCL (CARDIAC) PF 100 MG/5ML IV SOSY
PREFILLED_SYRINGE | INTRAVENOUS | Status: DC | PRN
  Administered 2023-09-13: 60 mg via INTRAVENOUS

## 2023-09-13 MED ORDER — MUPIROCIN 2 % EX OINT: 1.0000 | TOPICAL_OINTMENT | Freq: Once | CUTANEOUS | Status: DC

## 2023-09-13 MED ORDER — IRRISEPT - 450ML BOTTLE WITH 0.05% CHG IN STERILE WATER, USP 99.95% OPTIME
TOPICAL | Status: DC | PRN
  Administered 2023-09-13 (×3): 450 mL

## 2023-09-13 MED ORDER — PHENYLEPHRINE HCL-NACL 20-0.9 MG/250ML-% IV SOLN
INTRAVENOUS | Status: AC
  Filled 2023-09-13: qty 250

## 2023-09-13 MED ORDER — OXYCODONE HCL 5 MG PO TABS: 5.0000 mg | ORAL_TABLET | Freq: Four times a day (QID) | ORAL | 0 refills | Status: DC | PRN

## 2023-09-13 SURGICAL SUPPLY — 50 items
BAG URINE DRAIN 2000ML AR STRL (UROLOGICAL SUPPLIES) IMPLANT
BLADE SURG 15 STRL LF DISP TIS (BLADE) ×1 IMPLANT
BNDG GAUZE DERMACEA FLUFF 4 (GAUZE/BANDAGES/DRESSINGS) ×1 IMPLANT
BRIEF MESH DISP 2XL (UNDERPADS AND DIAPERS) ×1 IMPLANT
CATH COUDE 5CC RIBBED (CATHETERS) ×1 IMPLANT
CHLORAPREP W/TINT 26 (MISCELLANEOUS) ×2 IMPLANT
COVER BACK TABLE 60X90IN (DRAPES) ×1 IMPLANT
COVER MAYO STAND STRL (DRAPES) ×2 IMPLANT
DERMABOND ADVANCED .7 DNX12 (GAUZE/BANDAGES/DRESSINGS) ×1 IMPLANT
DRAIN CHANNEL 10F 3/8 F FF (DRAIN) ×1 IMPLANT
DRAPE INCISE IOBAN 66X45 STRL (DRAPES) ×1 IMPLANT
DRAPE LAPAROTOMY 100X72 PEDS (DRAPES) ×1 IMPLANT
DRAPE UTILITY XL STRL (DRAPES) ×1 IMPLANT
DRSG TEGADERM 4X4.75 (GAUZE/BANDAGES/DRESSINGS) ×1 IMPLANT
EVACUATOR SILICONE 100CC (DRAIN) ×1 IMPLANT
GAUZE 4X4 16PLY ~~LOC~~+RFID DBL (SPONGE) IMPLANT
GAUZE SPONGE 4X4 12PLY STRL (GAUZE/BANDAGES/DRESSINGS) ×1 IMPLANT
GLOVE BIO SURGEON STRL SZ7 (GLOVE) ×1 IMPLANT
GLOVE BIOGEL PI IND STRL 7.0 (GLOVE) ×1 IMPLANT
GOWN STRL REUS W/TWL LRG LVL3 (GOWN DISPOSABLE) ×1 IMPLANT
HEMOSTAT ARISTA ABSORB 3G PWDR (HEMOSTASIS) IMPLANT
HOLDER FOLEY CATH W/STRAP (MISCELLANEOUS) IMPLANT
IMPL SNAPCONE RTE CX 2.0 (Urological Implant) IMPLANT
KIT ACCESSORY AMS 700 PUMP (Urological Implant) IMPLANT
LAVAGE JET IRRISEPT WOUND (IRRIGATION / IRRIGATOR) ×2 IMPLANT
NDL HYPO 22X1.5 SAFETY MO (MISCELLANEOUS) ×1 IMPLANT
NEEDLE HYPO 22X1.5 SAFETY MO (MISCELLANEOUS) ×1 IMPLANT
PACK BASIN DAY SURGERY FS (CUSTOM PROCEDURE TRAY) ×1 IMPLANT
PENCIL SMOKE EVACUATOR (MISCELLANEOUS) ×1 IMPLANT
PLUG CATH AND CAP STRL 200 (CATHETERS) ×1 IMPLANT
PUMP PENILE AMS 700CX MS 12X21 (Erectile Restoration) IMPLANT
RESERVOIR FLAT IZ 100ML (Miscellaneous) IMPLANT
RETRACTOR DEEP SCROTAL PENILE (MISCELLANEOUS) IMPLANT
RETRACTOR WILSON SYSTEM (INSTRUMENTS) IMPLANT
SET COLLECT BLD 21X.75 12 PB G (NEEDLE) ×1 IMPLANT
SLEEVE SCD COMPRESS KNEE MED (STOCKING) ×1 IMPLANT
SUT ETHILON 3 0 PS 1 (SUTURE) ×1 IMPLANT
SUT MNCRL AB 4-0 PS2 18 (SUTURE) ×1 IMPLANT
SUT VIC AB 2-0 CT2 27 (SUTURE) IMPLANT
SUT VIC AB 2-0 UR6 27 (SUTURE) ×4 IMPLANT
SUT VIC AB 3-0 SH 27X BRD (SUTURE) ×2 IMPLANT
SYR 10ML LL (SYRINGE) ×3 IMPLANT
SYR 50ML LL SCALE MARK (SYRINGE) ×3 IMPLANT
SYR BULB IRRIG 60ML STRL (SYRINGE) ×1 IMPLANT
SYR CONTROL 10ML LL (SYRINGE) ×1 IMPLANT
TOWEL GREEN STERILE FF (TOWEL DISPOSABLE) ×2 IMPLANT
TRAY DSU PREP LF (CUSTOM PROCEDURE TRAY) ×1 IMPLANT
TUBE CONNECTING 20X1/4 (TUBING) ×1 IMPLANT
WATER STERILE IRR 1000ML POUR (IV SOLUTION) ×1 IMPLANT
YANKAUER SUCT BULB TIP NO VENT (SUCTIONS) ×1 IMPLANT

## 2023-09-13 NOTE — ED Provider Notes (Signed)
 [place] EMERGENCY DEPARTMENT AT [facility] Provider Note   CSN: [id number] Arrival date & time: 09/13/23  1550     History  Chief Complaint  Patient presents with   Post-op Problem    Nathan Wallace is a 73 y.o. male.  73 yo M with a cc of of difficulty urinating.  The patient just had a penile implant inserted today.  He got home and since then has not been able to empty his bladder.  He had had previous surgery for a artificial external urinary sphincter.  He has been trying to push the button but cannot seem to find it.  He thinks that might be due to the dressing that is placed on it.        Home Medications Prior to Admission medications   Medication Sig Start Date End Date Taking? Authorizing Provider  acetaminophen  (TYLENOL ) 500 MG tablet Take 2 tablets (1,000 mg total) by mouth every 8 (eight) hours. 09/13/23   Mallie Seal, MD  allopurinol  (ZYLOPRIM ) 300 MG tablet TAKE 1 TABLET BY MOUTH EVERY DAY 08/24/23   Roslyn Coombe, MD  amLODipine  (NORVASC ) 5 MG tablet TAKE 1 TABLET (5 MG TOTAL) BY MOUTH DAILY. 06/26/23   Roslyn Coombe, MD  Ascorbic Acid  (VITAMIN C  WITH ROSE HIPS) 1000 MG tablet Take 1 tablet (1,000 mg total) by mouth daily. Patient taking differently: Take 2,000 mg by mouth daily. 05/04/17   Roslyn Coombe, MD  atorvastatin  (LIPITOR) 40 MG tablet Take 1 tablet (40 mg total) by mouth daily. 08/22/22   Roslyn Coombe, MD  Cholecalciferol  (VITAMIN D ) 50 MCG (2000 UT) tablet Take 4,000 Units by mouth daily.    [provider]  Cyanocobalamin  (B-12 COMPLIANCE INJECTION IJ) Inject 1 Dose as directed every 30 (thirty) days.    [provider]  cyclobenzaprine  (FLEXERIL ) 5 MG tablet Take 1 tablet (5 mg total) by mouth 3 (three) times daily as needed. 08/16/23   Roslyn Coombe, MD  doxycycline  (VIBRA -TABS) 100 MG tablet Take 1 tablet (100 mg total) by mouth 2 (two) times daily. 08/24/23   Zorita Hiss, NP  gabapentin  (NEURONTIN ) 300 MG capsule TAKE  2 CAPSULES BY MOUTH 3 TIMES A DAY 08/14/23   Roslyn Coombe, MD  HYDROcodone -acetaminophen  (NORCO/VICODIN) 5-325 MG tablet 1/2 - 1  tab by mouth every 6 hrs as needed for mod to severe pain 08/16/23   Roslyn Coombe, MD  mirabegron ER (MYRBETRIQ) 25 MG TB24 tablet Take 25 mg by mouth daily.    [provider]  oxyCODONE  (OXY IR/ROXICODONE ) 5 MG immediate release tablet Take 1 tablet (5 mg total) by mouth every 6 (six) hours as needed for severe pain (pain score 7-10). 09/13/23   Mallie Seal, MD  pantoprazole  (PROTONIX ) 40 MG tablet TAKE 1 TABLET BY MOUTH EVERY DAY 08/23/23   Roslyn Coombe, MD  PRESCRIPTION MEDICATION 1 Syringe by Intracavernosal route every other day as needed (10-30 minutes before you have sex for ED). Matrix (Papaverine 30mg  Phentolamine 1mg  Alprostadil 40mcg per ml injection)  erectile dysfunction injection every other day as needed for sex    [provider]  sulfamethoxazole -trimethoprim  (BACTRIM  DS) 800-160 MG tablet Take 1 tablet by mouth 2 (two) times daily. 09/13/23   Mallie Seal, MD  tiZANidine  (ZANAFLEX ) 4 MG tablet TAKE 1 TABLET BY MOUTH 3  TIMES DAILY AS NEEDED FOR  MUSCLE SPASM(S) 01/02/17   Roslyn Coombe, MD  Allergies    Nsaids    Review of Systems   Review of Systems  Physical Exam Updated Vital Signs BP (!) 134/94   Pulse 82   Temp 97.6 F (36.4 C) (Oral)   Resp 18   Ht 5\' 10"  (1.778 m)   Wt 73 kg   SpO2 96%   BMI 23.10 kg/m  Physical Exam Vitals and nursing note reviewed.  Constitutional:      Appearance: He is well-developed.  HENT:     Head: Normocephalic and atraumatic.  Eyes:     Pupils: Pupils are equal, round, and reactive to light.  Neck:     Vascular: No JVD.  Cardiovascular:     Rate and Rhythm: Normal rate and regular rhythm.     Heart sounds: No murmur heard.    No friction rub. No gallop.  Pulmonary:     Effort: No respiratory distress.     Breath sounds: No wheezing.  Abdominal:     General: There is no  distension.     Tenderness: There is no abdominal tenderness. There is no guarding or rebound.  Musculoskeletal:        General: Normal range of motion.     Cervical back: Normal range of motion and neck supple.  Skin:    Coloration: Skin is not pale.     Findings: No rash.  Neurological:     Mental Status: He is alert and oriented to person, place, and time.  Psychiatric:        Behavior: Behavior normal.     ED Results / Procedures / Treatments   Labs (all labs ordered are listed, but only abnormal results are displayed) Labs Reviewed - No data to display  EKG None  Radiology No results found.  Procedures Procedures    Medications Ordered in ED Medications - No data to display  ED Course/ Medical Decision Making/ A&P                                 Medical Decision Making  73 yo M with a chief complaints of difficulty urinating.  The patient has a history of prior surgery requiring placement of a  artificial urinary sphincter.  The patient had a penile implant placed today and has struggled to find his button to push to relieve his bladder.  I attempted to help the patient find the button in the room but without improvement.  His dressing was taken off and the patient was able to look at the area little bit easier but is unable to find how to deflate the balloon.  The patient was eventually able to get some urine out.  Had maybe 50 cc of urine output.  Still with about 330 left in the bladder on bladder scan.  I discussed this with urology, Dr. Aden Agreste, they will come evaluate the patient at bedside.  Artificial sphincter deactivated.  D/c home.   5:23 PM:  I have discussed the diagnosis/risks/treatment options with the patient and family.  Evaluation and diagnostic testing in the emergency department does not suggest an emergent condition requiring admission or immediate intervention beyond what has been performed at this time.  They will follow up with Urology. We also  discussed returning to the ED immediately if new or worsening sx occur. We discussed the sx which are most concerning (e.g., sudden worsening pain, fever, inability to tolerate by mouth) that necessitate immediate return.  Medications administered to the patient during their visit and any new prescriptions provided to the patient are listed below.  Medications given during this visit Medications - No data to display   The patient appears reasonably screen and/or stabilized for discharge and I doubt any other medical condition or other [facility] requiring further screening, evaluation, or treatment in the ED at this time prior to discharge.            Final Clinical Impression(s) / ED Diagnoses Final diagnoses:  Urinary retention    Rx / DC Orders ED Discharge Orders     None         Albertus Hughs, DO 09/13/23 1723

## 2023-09-13 NOTE — Transfer of Care (Signed)
 Immediate Anesthesia Transfer of Care Note  Patient: Nareg Breighner  Procedure(s) Performed: INSERTION, PENILE PROSTHESIS (Penis)  Patient Location: PACU  Anesthesia Type:General  Level of Consciousness: drowsy and patient cooperative  Airway & Oxygen Therapy: Patient Spontanous Breathing and Patient connected to nasal cannula oxygen  Post-op Assessment: Report given to RN and Post -op Vital signs reviewed and stable  Post vital signs: Reviewed and stable  Last Vitals:  Vitals Value Taken Time  BP 111/76 09/13/23 1105  Temp    Pulse 67 09/13/23 1106  Resp 13 09/13/23 1106  SpO2 93 % 09/13/23 1106  Vitals shown include unfiled device data.  Last Pain:  Vitals:   09/13/23 0716  TempSrc: Temporal  PainSc: 0-No pain      Patients Stated Pain Goal: 5 (09/13/23 0716)  Complications: No notable events documented.

## 2023-09-13 NOTE — Anesthesia Procedure Notes (Signed)
 Procedure Name: LMA Insertion Date/Time: 09/13/2023 8:35 AM  Performed by: Lonia Ro, CRNAPre-anesthesia Checklist: Patient identified, Emergency Drugs available, Suction available, Patient being monitored and Timeout performed Patient Re-evaluated:Patient Re-evaluated prior to induction Oxygen Delivery Method: Circle system utilized Preoxygenation: Pre-oxygenation with 100% oxygen Induction Type: IV induction LMA: LMA inserted LMA Size: 5.0 Number of attempts: 1 Placement Confirmation: positive ETCO2 and breath sounds checked- equal and bilateral Tube secured with: Tape Dental Injury: Teeth and Oropharynx as per pre-operative assessment

## 2023-09-13 NOTE — Consult Note (Cosign Needed)
 Urology Consult Note   Requesting Attending Physician:  No att. providers found Service Providing Consult: Urology  Consulting Attending: Dr. Doy Gene, MD   Reason for Consult:  AUS malfunction  HPI: Nathan Wallace is seen in consultation for reasons noted above at the request of Dr. Albertus Hughs for AUS deactivation.  Patient is a 72yM with a history of prostate cancer s/p RALP with subsequent SUI s/p AUS and ED now s/p IPP this morning. He was sent home from the OR with his AUS activated. He presented to the ED due to inability to use his device and subsequent urinary retention with bladder scan in the 300s.   Dressing was removed in the ED but he was still unable to use the pump 2/2 swelling and pain. I was able to deactivate the device with dimple confirmed after 10 minutes and patient visibly leaking urine. He is otherwise afebrile and HDS.      Past Medical History: Past Medical History:  Diagnosis Date   AKI (acute kidney injury) (HCC)    Arthritis    RT KNEE AND RT SHOULDER   B12 deficiency    Benign head tremor    Chest tightness    comes and goes    CVA (cerebrovascular accident due to intracerebral hemorrhage) (HCC) 08/03/2011   Degenerative joint disease of knee, right    Depression 07/26/2012   no problems now   Diverticulosis    GERD (gastroesophageal reflux disease)    NO RECENT PROBLEMS AND NO MEDS   History of blood transfusion    History of kidney stones    History of urinary tract infection    Hyperlipidemia    border line   Hypertension    Impaired glucose tolerance 06/22/2011   Left inguinal hernia    Macrocytic anemia    MGUS (monoclonal gammopathy of unknown significance)    Prostate cancer (HCC) 2011   Rotator cuff tear arthropathy of right shoulder, with instability 10/25/2016   Sepsis (HCC)    Stress incontinence    Stroke [facility])    2013   Syncope 2012   after prostate surgery; neg neurology work up.    Tinnitus of right ear 12/08/2021    [place], non-pulsatile      Past Surgical History:  Past Surgical History:  Procedure Laterality Date   COLONOSCOPY     INGUINAL HERNIA REPAIR  06/01/2011   Procedure: HERNIA REPAIR INGUINAL ADULT;  Surgeon: Levert Ready, MD;  Location: [facility] ORS;  Service: General;  Laterality: Left;  Left Inguinal Hernia Repair with Mesh   ROBOT ASSISTED LAPAROSCOPIC RADICAL PROSTATECTOMY  jan 2012   da vinci   SPINE SURGERY  2003 OR 2004   CERVICAL FUSION-STATES LIMITED ROM NECK   TOTAL SHOULDER ARTHROPLASTY Right 10/25/2016   TOTAL SHOULDER ARTHROPLASTY Right 10/25/2016   Procedure: RIGHT TOTAL SHOULDER ARTHROPLASTY;  Surgeon: Osa Blase, MD;  Location: [facility] OR;  Service: Orthopedics;  Laterality: Right;   URETHRAL SLING N/A 03/08/2016   Procedure: MALE SLING CYSTOSCOPY;  Surgeon: Erman Hayward, MD;  Location: [facility] ORS;  Service: Urology;  Laterality: N/A;   URINARY SPHINCTER IMPLANT N/A 10/04/2022   Procedure: ARTIFICIAL URINARY SPHINCTER;  Surgeon: Mallie Seal, MD;  Location: [facility] ORS;  Service: Urology;  Laterality: N/A;  120 MINUTES NEEDED FOR CASE   WRIST SURGERY     right     Medication: Current Facility-Administered Medications  Medication Dose Route Frequency Provider Last Rate Last Admin   cyanocobalamin  ((VITAMIN B-12))  injection 1,000 mcg  1,000 mcg Intramuscular Q30 days Roslyn Coombe, MD   1,000 mcg at 10/13/21 1610   Current Outpatient Medications  Medication Sig Dispense Refill   acetaminophen  (TYLENOL ) 500 MG tablet Take 2 tablets (1,000 mg total) by mouth every 8 (eight) hours. 50 tablet 0   allopurinol  (ZYLOPRIM ) 300 MG tablet TAKE 1 TABLET BY MOUTH EVERY DAY 90 tablet 3   amLODipine  (NORVASC ) 5 MG tablet TAKE 1 TABLET (5 MG TOTAL) BY MOUTH DAILY. 90 tablet 3   Ascorbic Acid  (VITAMIN C  WITH ROSE HIPS) 1000 MG tablet Take 1 tablet (1,000 mg total) by mouth daily. (Patient taking differently: Take 2,000 mg by mouth daily.) 90 tablet 0   atorvastatin  (LIPITOR) 40 MG tablet  Take 1 tablet (40 mg total) by mouth daily. 90 tablet 3   Cholecalciferol  (VITAMIN D ) 50 MCG (2000 UT) tablet Take 4,000 Units by mouth daily.     Cyanocobalamin  (B-12 COMPLIANCE INJECTION IJ) Inject 1 Dose as directed every 30 (thirty) days.     cyclobenzaprine  (FLEXERIL ) 5 MG tablet Take 1 tablet (5 mg total) by mouth 3 (three) times daily as needed. 40 tablet 1   doxycycline  (VIBRA -TABS) 100 MG tablet Take 1 tablet (100 mg total) by mouth 2 (two) times daily. 20 tablet 0   gabapentin  (NEURONTIN ) 300 MG capsule TAKE 2 CAPSULES BY MOUTH 3 TIMES A DAY 90 capsule 5   HYDROcodone -acetaminophen  (NORCO/VICODIN) 5-325 MG tablet 1/2 - 1  tab by mouth every 6 hrs as needed for mod to severe pain 30 tablet 0   mirabegron ER (MYRBETRIQ) 25 MG TB24 tablet Take 25 mg by mouth daily.     pantoprazole  (PROTONIX ) 40 MG tablet TAKE 1 TABLET BY MOUTH EVERY DAY 30 tablet 0   sulfamethoxazole -trimethoprim  (BACTRIM  DS) 800-160 MG tablet Take 1 tablet by mouth 2 (two) times daily. 14 tablet 0   oxyCODONE  (OXY IR/ROXICODONE ) 5 MG immediate release tablet Take 1 tablet (5 mg total) by mouth every 6 (six) hours as needed for severe pain (pain score 7-10). 20 tablet 0   PRESCRIPTION MEDICATION 1 Syringe by Intracavernosal route every other day as needed (10-30 minutes before you have sex for ED). Matrix (Papaverine 30mg  Phentolamine 1mg  Alprostadil 40mcg per ml injection)  erectile dysfunction injection every other day as needed for sex     tiZANidine  (ZANAFLEX ) 4 MG tablet TAKE 1 TABLET BY MOUTH 3  TIMES DAILY AS NEEDED FOR  MUSCLE SPASM(S) 270 tablet 1    Allergies: Allergies  Allergen Reactions   Nsaids Other (See Comments)    Gastric ulcer    Social History: Social History   Tobacco Use   Smoking status: Former    Current packs/day: 0.00    Average packs/day: 0.5 packs/day for 1 year (0.5 ttl pk-yrs)    Types: Cigarettes    Start date: 06/27/1965    Quit date: 06/28/1966    Years since quitting: 57.2    Smokeless tobacco: Never  Vaping Use   Vaping status: Never Used  Substance Use Topics   Alcohol use: Yes    Comment: 3 beers daily; liquor 2 glasses every other day    Drug use: Not Currently    Types: Marijuana    Comment: Has not used in years    Family History Family History  Problem Relation Age of Onset   Hypertension Mother    Aneurysm Mother        brain   Colon cancer Father 59  Heart attack Brother     Review of Systems 10 systems were reviewed and are negative except as noted specifically in the HPI.  Objective   Vital signs in last 24 hours: BP 107/75   Pulse (!) 54   Temp 98 F (36.7 C) (Temporal)   Resp 20   Ht 5\' 10"  (1.778 m)   Wt 73.4 kg   SpO2 98%   BMI 23.22 kg/m   Physical Exam General: NAD, A&O, resting, appropriate HEENT: [place]/AT, EOMI, MMM Pulmonary: Normal work of breathing Cardiovascular: HDS, adequate peripheral perfusion Abdomen: Soft, NTTP, nondistended. Incision c/d/I. Drain with SS output. GU: Penile prosthesis in place, AUS pump palpated and deactivated Extremities: warm and well perfused Neuro: Appropriate, no focal neurological deficits  Most Recent Labs: Lab Results  Component Value Date   WBC 5.9 01/16/2023   HGB 14.4 01/16/2023   HCT 41.1 01/16/2023   PLT 278 01/16/2023    Lab Results  Component Value Date   NA 141 01/16/2023   K 3.9 01/16/2023   CL 106 01/16/2023   CO2 28 01/16/2023   BUN 11 01/16/2023   CREATININE 1.02 01/16/2023   CALCIUM  9.8 01/16/2023   MG 2.5 (H) 01/06/2016   PHOS 5.1 (H) 01/06/2016    Lab Results  Component Value Date   INR 1.12 03/02/2016   APTT 25 03/02/2016     Urine Culture: @[id number] (laburin,org,[id number],[id number])@   IMAGING: No results found.  ------  Assessment:  73 y.o. male with history of prostate cancer s/p RALP, subsequent SUI s/p AUS, and ED s/p IPP today who presented with inability to deactivate his AUS and subsequent urinary retention.   Recommendations: -  AUS deactivated, dimple confirmed after 10 minutes - Patient will remain deactivated until his follow up visit on 09/16/23. He understands he will leak urine.    Thank you for this consult. Please contact the urology consult pager with any further questions/concerns.  I have seen and examined the patient and agree with the above assessment and plan.  Matt R. Gay MD [facility]  Pager: [phone]

## 2023-09-13 NOTE — ED Triage Notes (Addendum)
 Pt c/o bladder pain and drain issues following a procedure to have a penile prothesis placed this afternoon.  Pain score 9/10.    Pt reports he has been unable to manage the drain and "button" used to help urination since going home.  Pt sts "they showed me how to do it, but didn't make sure I was able to do it before I left."  Pt reports "they have me wrapped up like a mummy down there and I can't get to the button."

## 2023-09-13 NOTE — Discharge Instructions (Addendum)
 Penile prosthesis postoperative instructions  Wound:  In most cases your incision will have absorbable sutures that will dissolve within the first 10-20 days. Some will fall out even earlier. Expect some redness as the sutures dissolved but this should occur only around the sutures. If there is generalized redness, especially with increasing pain or swelling, let us  know. The scrotum and penis will very likely get "black and blue" as the blood in the tissues spread. Sometimes the whole scrotum will turn colors. The black and blue is followed by a yellow and brown color. In time, all the discoloration will go away. In some cases some firm swelling in the area of the testicle and pump may persist for up to 4-6 weeks after the surgery and is considered normal in most cases.  Drain:   You may be discharged home with a drain in place. If so, you will be taught how to empty it and should keep track of the output. Additionally, you should call the office to arrange for an appointment to have it removed after a few days.   Diet:  You may return to your normal diet within 24 hours following your surgery. You may note some mild nausea and possibly vomiting the first 6-8 hours following surgery. This is usually due to the side effects of anesthesia, and will disappear quite soon. I would suggest clear liquids and a very light meal the first evening following your surgery.  Activity:  Your physical activity should be restricted the first 48 hours. During that time you should remain relatively inactive, moving about only when necessary. During the first 3 weeks following surgery you should avoid lifting any heavy objects (anything greater than 15 pounds), and avoid strenuous exercise. If you work, ask us  specifically about your restrictions, both for work and home. We will write a note to your employer if needed.  Avoid using your penis until your follow up visit with Dr Jarvis Mesa, which will typically be around  3-4 weeks following the surgery. Most people are able to start cycling their device after that appointment, and can have intercourse soon thereafter.   You should plan to wear a tight pair of jockey shorts or an athletic supporter for the first 4-5 days, even to sleep. This will keep the scrotum immobilized to some degree and keep the swelling down.The position of your penis will determine what is most comfortable but I strongly urge you to keep the penis in the "up" position (toward your head). You should continue to tuck "up" your penis when possible for the first 3 months following surgery.  Ice packs should be placed on and off over the scrotum for the first 48 hours. Frozen peas or corn in a ZipLock bag can be frozen, used and re-frozen. Fifteen minutes on and 15 minutes off is a reasonable schedule. The ice is a good pain reliever and keeps the swelling down.  Hygiene:  You may shower 48 hours after your surgery. Tub bathing should be restricted until the wound is completely healed, typically around 2-3 weeks.  Medication:  You will be sent home with some type of pain medication. In many cases you will be sent home with a strong anti-inflammatory medication (Celebrex, Meloxicam) and a narcotic pain pill (hydrocodone  or oxycodone ). You can also supplement these medications with tylenol  (acetaminophen ). If the pain medication you are sent home with does not control the pain, please notify the office Problems you should report to us :  Fever of 101.0 degrees  Fahrenheit or greater. Moderate or severe swelling under the skin incision or involving the scrotum. Drug reaction such as hives, a rash, nausea or vomiting.\    Post Anesthesia Home Care Instructions  Activity: Get plenty of rest for the remainder of the day. A responsible individual must stay with you for 24 hours following the procedure.  For the next 24 hours, DO NOT: -Drive a car -Advertising copywriter -Drink alcoholic  beverages -Take any medication unless instructed by your physician -Make any legal decisions or sign important papers.  Meals: Start with liquid foods such as gelatin or soup. Progress to regular foods as tolerated. Avoid greasy, spicy, heavy foods. If nausea and/or vomiting occur, drink only clear liquids until the nausea and/or vomiting subsides. Call your physician if vomiting continues.  Special Instructions/Symptoms: Your throat may feel dry or sore from the anesthesia or the breathing tube placed in your throat during surgery. If this causes discomfort, gargle with warm salt water . The discomfort should disappear within 24 hours.  If you had a scopolamine patch placed behind your ear for the management of post- operative nausea and/or vomiting:  1. The medication in the patch is effective for 72 hours, after which it should be removed.  Wrap patch in a tissue and discard in the trash. Wash hands thoroughly with soap and water . 2. You may remove the patch earlier than 72 hours if you experience unpleasant side effects which may include dry mouth, dizziness or visual disturbances. 3. Avoid touching the patch. Wash your hands with soap and water  after contact with the patch.  No tylenol  until after 1:15pm today if needed.

## 2023-09-13 NOTE — Anesthesia Procedure Notes (Signed)
 Procedure Name: LMA Insertion Date/Time: 09/13/2023 8:44 AM  Performed by: Lonia Ro, CRNAPre-anesthesia Checklist: Patient identified, Emergency Drugs available, Suction available, Patient being monitored and Timeout performed Patient Re-evaluated:Patient Re-evaluated prior to induction Oxygen Delivery Method: Circle system utilized Preoxygenation: Pre-oxygenation with 100% oxygen Induction Type: IV induction LMA: LMA with gastric port inserted LMA Size: 4.0 Number of attempts: 1 Placement Confirmation: positive ETCO2 and breath sounds checked- equal and bilateral Tube secured with: Tape Dental Injury: Teeth and Oropharynx as per pre-operative assessment

## 2023-09-13 NOTE — Op Note (Signed)
 PATIENT:  Nathan Wallace  PRE-OPERATIVE DIAGNOSIS:  Organic erectile dysfunction  POST-OPERATIVE DIAGNOSIS:  Same  PROCEDURE:   3 piece inflatable penile prosthesis (BS/AMS) Injection of pharmacoagent into penis Plastic procedure of penis to correct curvature  SURGEON:  Julene Oaks MD  ASST: Alphonza Ashing, MD  INDICATION: He has had long-standing organic erectile dysfunction and refractory to other modes of treatment. He has elected to proceed with prosthesis implantation.  ANESTHESIA:  General  EBL:  50 cc  Device: 3 piece AMS CX 700: 95 cc reservoir submuscular in midline, 21 cm cylinders and 2 cm rear-tip extenders on right and left sides  LOCAL MEDICATIONS USED:   penile block done with 10 cc lidocaine /marcaine  mixture 10 cc injected into corpora directly via butterfly needle  SPECIMEN: None  Description of procedure: The patient was taken to the major operating room, placed on the table and administered general anesthesia in the supine position. His genitalia was then prepped with chlorhexidine  x 2. He was draped in the usual sterile fashion, and I used Ioban on the field. An official timeout was then performed.  A dorsal penile block was performed. A butterfly needle was then used to inject normal saline into the penis to give an artificial erection. There was ~30 degrees of rightward curvature, ~15 degrees dorsal. I then injected 10 cc of lidocaine /marcaine  into the penis.   After deactivating his AUS, a 36 [place] coude catheter was then placed in the bladder and the bladder was drained and the catheter was plugged. A midline penoscrotal incision was then made and the dissection was carried down to the corpora and urethra. The lonestar retractor was positioned so as to have excellent exposure. 2-0 Vicryl sutures were then placed proximally in each corpus cavernosum to serve as stay sutures. An incision was then made in the corpus cavernosum first on the left-hand side with the  bovie. Seabron [place] were used to gently dilate the opening. I then dilated the corpus cavernosum with the a 12 Fr brooks dilator distally and proximally. Field goal post tests were performed and there was no evidence of perforations or crossover. I then irrigated the corpus cavernosum with antibiotic solution and measured the distance proximally and distally from the stay suture and was found to be 9 and 14 cm, respectively.I then turned my attention to the contralateral corpus cavernosum and placed my stay sutures, made my corporotomy and dilated the corpus cavernosum in an identical fashion. This was measured and also was found to be 8.5 cm proximally and 14 distally. It was irrigated with anastomotic solution as was the scrotum. I then chose an 21 cm cylinder set with 2 cm rear-tip extenders and these were prepped while I prepared the site for reservoir placement.  I then digitally probed into the Left external inguinal ring. The patient was status post hernia repair here. I did not feel I could easily poke into his pelvis, and there was scarring around the conjoint tendon. His PRB for his AUS was on the right side, so we elected to make a counter incision. A vertical incision was made in the midline a few finger breadths above the pubic symphysis. We dissected down to the fascia. A longitudinal opening was sharply made, and then the muscle was split with a hemostat. I used my finger to carefully make room for the reservoir. A nasal speculum was used to facilitate placement. I irrigated the space with anastomotic solution and then placed the reservoir in this location. I then filled the  reservoir with 95 cc of sterile saline, and checked to confirm proper position. There was minimal backpressure with the reservoir max-filled.  Attention was redirected to the corporotomies where the cylinders were then placed by first fixing the suture to the distal aspect of the right cylinder to a straight needle. This was then  loaded on the [facility] inserter and passed through the corporotomy and distally. I then advanced the straight needle with the Furlow inserter out through the glans and this was grasped with a hemostat and pulled through the glans and the suture was secured with a hemostat. I then performed an identical maneuver on the contralateral side. After this was performed I irrigated both corpus cavernosum; there was no evidence of urethral perforation. I inserted the distal portion of the cylinder through the corporotomies and pulled this to the end of the corpora with the suture. The proximal aspect with the rear-tip extender was then passed through the corporotomy and into the seated position on each side. I then connected reservoir tubing to a syringe filled with sterile saline and inflated the device. There was some residual curvature. I repassed the cylinders to ensure no crossover, and the curvature persisted. I then closed the corporotomies with the stay sutures. At this time I elected to perform penile modeling, which was done after the pump was connected. I performed 3 modeling cycles for >1 minute each. Thereafter, there was less than 15 degrees of rightward curvature.     I then grasped the scrotal skin in the midline with a babcock, and used a hemostat to dissect down to the dependent-most portion of the scrotum. The nasal speculum was inserted into this space, and facilitated placement of the pump. The cylinder was then connected to the pump after excising the excess tubing with appropriate shodded hemostats in place and then I used the supplied connectors to make the connection. I then again cycled the device with the pump and it cycled properly. I deflated the device. I irrigated the wound one last time with antibiotic irrigation and then closed the deep scrotal tissue over the tubing and pump with running 3-0 monocryl suture. I placed a 10 Fr blake drain over the corporotomies. A second layer was then  closed over this first layer with running 3- 0 monocryl, and running skin suture w/ 4-0 monocryl performed. The abdominal incision was closed in layers - fascia with 2-0 vicryl.  Incision dressed with dermabond.  A mummy wrap was applied. The catheter was removed, AUS reactivated, and drain connected to suction bulb and the patient was awakened and taken recovery room in stable and satisfactory condition. He tolerated the procedure well and there were no intraoperative complications. Needle sponge and instrument counts were correct at the end of the operation.

## 2023-09-13 NOTE — Discharge Instructions (Signed)
 Follow up with your urologist in the office as scheduled.  Please return for inability to urinate, fever.

## 2023-09-13 NOTE — H&P (Signed)
 H&P  History of Present Illness: Nathan Wallace is a 73 y.o. year old M who presents today for insertion of an inflatable penile prosthesis  No acute complaints  Past Medical History:  Diagnosis Date   AKI (acute kidney injury) (HCC)    Arthritis    RT KNEE AND RT SHOULDER   B12 deficiency    Benign head tremor    Chest tightness    comes and goes    CVA (cerebrovascular accident due to intracerebral hemorrhage) (HCC) 08/03/2011   Degenerative joint disease of knee, right    Depression 07/26/2012   no problems now   Diverticulosis    GERD (gastroesophageal reflux disease)    NO RECENT PROBLEMS AND NO MEDS   History of blood transfusion    History of kidney stones    History of urinary tract infection    Hyperlipidemia    border line   Hypertension    Impaired glucose tolerance 06/22/2011   Left inguinal hernia    Macrocytic anemia    MGUS (monoclonal gammopathy of unknown significance)    Prostate cancer (HCC) 2011   Rotator cuff tear arthropathy of right shoulder, with instability 10/25/2016   Sepsis (HCC)    Stress incontinence    Stroke [facility])    2013   Syncope 2012   after prostate surgery; neg neurology work up.    Tinnitus of right ear 12/08/2021   [place], non-pulsatile      Past Surgical History:  Procedure Laterality Date   COLONOSCOPY     INGUINAL HERNIA REPAIR  06/01/2011   Procedure: HERNIA REPAIR INGUINAL ADULT;  Surgeon: Levert Ready, MD;  Location: [facility] ORS;  Service: General;  Laterality: Left;  Left Inguinal Hernia Repair with Mesh   ROBOT ASSISTED LAPAROSCOPIC RADICAL PROSTATECTOMY  jan 2012   da vinci   SPINE SURGERY  2003 OR 2004   CERVICAL FUSION-STATES LIMITED ROM NECK   TOTAL SHOULDER ARTHROPLASTY Right 10/25/2016   TOTAL SHOULDER ARTHROPLASTY Right 10/25/2016   Procedure: RIGHT TOTAL SHOULDER ARTHROPLASTY;  Surgeon: Osa Blase, MD;  Location: [facility] OR;  Service: Orthopedics;  Laterality: Right;   URETHRAL SLING N/A 03/08/2016    Procedure: MALE SLING CYSTOSCOPY;  Surgeon: Erman Hayward, MD;  Location: [facility] ORS;  Service: Urology;  Laterality: N/A;   URINARY SPHINCTER IMPLANT N/A 10/04/2022   Procedure: ARTIFICIAL URINARY SPHINCTER;  Surgeon: Mallie Seal, MD;  Location: [facility] ORS;  Service: Urology;  Laterality: N/A;  120 MINUTES NEEDED FOR CASE   WRIST SURGERY     right     Home Medications:  Current Facility-Administered Medications for the 09/13/23 encounter [facility] Encounter)  Medication   cyanocobalamin  ((VITAMIN B-12)) injection 1,000 mcg   Current Meds  Medication Sig   allopurinol  (ZYLOPRIM ) 300 MG tablet TAKE 1 TABLET BY MOUTH EVERY DAY   amLODipine  (NORVASC ) 5 MG tablet TAKE 1 TABLET (5 MG TOTAL) BY MOUTH DAILY.   Ascorbic Acid  (VITAMIN C  WITH ROSE HIPS) 1000 MG tablet Take 1 tablet (1,000 mg total) by mouth daily. (Patient taking differently: Take 2,000 mg by mouth daily.)   atorvastatin  (LIPITOR) 40 MG tablet Take 1 tablet (40 mg total) by mouth daily.   Cholecalciferol  (VITAMIN D ) 50 MCG (2000 UT) tablet Take 4,000 Units by mouth daily.   Cyanocobalamin  (B-12 COMPLIANCE INJECTION IJ) Inject 1 Dose as directed every 30 (thirty) days.   cyclobenzaprine  (FLEXERIL ) 5 MG tablet Take 1 tablet (5 mg total) by mouth 3 (three) times daily as needed.  doxycycline  (VIBRA -TABS) 100 MG tablet Take 1 tablet (100 mg total) by mouth 2 (two) times daily.   gabapentin  (NEURONTIN ) 300 MG capsule TAKE 2 CAPSULES BY MOUTH 3 TIMES A DAY   HYDROcodone -acetaminophen  (NORCO/VICODIN) 5-325 MG tablet 1/2 - 1  tab by mouth every 6 hrs as needed for mod to severe pain   mirabegron ER (MYRBETRIQ) 25 MG TB24 tablet Take 25 mg by mouth daily.   oxyCODONE  (OXY IR/ROXICODONE ) 5 MG immediate release tablet Take 1 tablet (5 mg total) by mouth every 6 (six) hours as needed for severe pain.   pantoprazole  (PROTONIX ) 40 MG tablet TAKE 1 TABLET BY MOUTH EVERY DAY    Allergies:  Allergies  Allergen Reactions   Nsaids Other (See  Comments)    Gastric ulcer    Family History  Problem Relation Age of Onset   Hypertension Mother    Aneurysm Mother        brain   Colon cancer Father 49   Heart attack Brother     Social History:  reports that he quit smoking about 57 years ago. His smoking use included cigarettes. He started smoking about 58 years ago. He has a 0.5 pack-year smoking history. He has never used smokeless tobacco. He reports current alcohol use. He reports that he does not currently use drugs after having used the following drugs: Marijuana.  ROS: A complete review of systems was performed.  All systems are negative except for pertinent findings as noted.  Physical Exam:  Vital signs in last 24 hours: Temp:  [97.1 F (36.2 C)] 97.1 F (36.2 C) (06/04 0716) Pulse Rate:  [68] 68 (06/04 0716) Resp:  [17] 17 (06/04 0716) BP: (144)/(87) 144/87 (06/04 0716) SpO2:  [100 %] 100 % (06/04 0716) Weight:  [73.4 kg] 73.4 kg (06/04 0716) Constitutional:  Alert and oriented, No acute distress Cardiovascular: Regular rate and rhythm Respiratory: Normal respiratory effort, Lungs clear bilaterally GI: Abdomen is soft, nontender, nondistended, no abdominal masses Lymphatic: No lymphadenopathy Neurologic: Grossly intact, no focal deficits Psychiatric: Normal mood and affect   Laboratory Data:  No results for input(s): "WBC", "HGB", "HCT", "PLT" in the last 72 hours.  No results for input(s): "NA", "K", "CL", "GLUCOSE", "BUN", "CALCIUM ", "CREATININE" in the last 72 hours.  Invalid input(s): "CO3"   No results found for this or any previous visit (from the past 24 hours). No results found for this or any previous visit (from the past 240 hours).  Renal Function: No results for input(s): "CREATININE" in the last 168 hours. CrCl cannot be calculated (Patient's most recent lab result is older than the maximum 21 days allowed.).  Radiologic Imaging: No results found.  Assessment:  Nathan Wallace is a 73  y.o. year old M with ED refractory to other medical treatments  Plan:  To OR as planned for IPP. Procedure and risks reviewed, including but not limited to bleeding, infection, implant infection, implant malfunction, implant malplacement, erosion, damage to adjacent structures, pain, urinary retention, damage to AUS, AUS infection. All questions answered   Julene Oaks, MD 09/13/2023, 8:23 AM  [facility] Pager: [phone]

## 2023-09-13 NOTE — Anesthesia Postprocedure Evaluation (Signed)
 Anesthesia Post Note  Patient: Nathan Wallace  Procedure(s) Performed: INSERTION, PENILE PROSTHESIS (Penis)     Patient location during evaluation: PACU Anesthesia Type: General Level of consciousness: awake and alert Pain management: pain level controlled Vital Signs Assessment: post-procedure vital signs reviewed and stable Respiratory status: spontaneous breathing, nonlabored ventilation and respiratory function stable Cardiovascular status: blood pressure returned to baseline Postop Assessment: no apparent nausea or vomiting Anesthetic complications: no   No notable events documented.  Last Vitals:  Vitals:   09/13/23 1130 09/13/23 1145  BP: 125/88 (!) 152/87  Pulse: (!) 59 60  Resp: (!) 9 14  Temp:    SpO2: 96% 95%    Last Pain:  Vitals:   09/13/23 1145  TempSrc:   PainSc: 3                  Rayfield [place]

## 2023-09-14 ENCOUNTER — Encounter (HOSPITAL_BASED_OUTPATIENT_CLINIC_OR_DEPARTMENT_OTHER): Payer: Self-pay | Admitting: Urology

## 2023-09-19 ENCOUNTER — Other Ambulatory Visit: Payer: Self-pay | Admitting: Internal Medicine

## 2023-09-19 ENCOUNTER — Other Ambulatory Visit: Payer: Self-pay

## 2023-09-21 ENCOUNTER — Other Ambulatory Visit: Payer: Self-pay | Admitting: Internal Medicine

## 2023-09-21 ENCOUNTER — Other Ambulatory Visit: Payer: Self-pay

## 2023-10-12 DIAGNOSIS — N5201 Erectile dysfunction due to arterial insufficiency: Secondary | ICD-10-CM | POA: Diagnosis not present

## 2023-11-09 ENCOUNTER — Ambulatory Visit (INDEPENDENT_AMBULATORY_CARE_PROVIDER_SITE_OTHER)

## 2023-11-09 DIAGNOSIS — E538 Deficiency of other specified B group vitamins: Secondary | ICD-10-CM | POA: Diagnosis not present

## 2023-11-09 MED ORDER — CYANOCOBALAMIN 1000 MCG/ML IJ SOLN
1000.0000 ug | Freq: Once | INTRAMUSCULAR | Status: AC
  Administered 2023-11-09: 1000 ug via INTRAMUSCULAR

## 2023-11-09 NOTE — Progress Notes (Signed)
 Pt was given B12 injection without any complications at this time.

## 2023-12-12 ENCOUNTER — Encounter: Payer: Self-pay | Admitting: Internal Medicine

## 2023-12-12 ENCOUNTER — Ambulatory Visit: Admitting: Internal Medicine

## 2023-12-12 VITALS — BP 124/80 | HR 54 | Temp 98.0°F | Ht 70.0 in | Wt 162.2 lb

## 2023-12-12 DIAGNOSIS — E538 Deficiency of other specified B group vitamins: Secondary | ICD-10-CM

## 2023-12-12 DIAGNOSIS — I10 Essential (primary) hypertension: Secondary | ICD-10-CM | POA: Diagnosis not present

## 2023-12-12 DIAGNOSIS — E611 Iron deficiency: Secondary | ICD-10-CM | POA: Diagnosis not present

## 2023-12-12 DIAGNOSIS — R7302 Impaired glucose tolerance (oral): Secondary | ICD-10-CM

## 2023-12-12 DIAGNOSIS — E7800 Pure hypercholesterolemia, unspecified: Secondary | ICD-10-CM | POA: Diagnosis not present

## 2023-12-12 DIAGNOSIS — Z23 Encounter for immunization: Secondary | ICD-10-CM | POA: Diagnosis not present

## 2023-12-12 DIAGNOSIS — E559 Vitamin D deficiency, unspecified: Secondary | ICD-10-CM

## 2023-12-12 DIAGNOSIS — N320 Bladder-neck obstruction: Secondary | ICD-10-CM | POA: Diagnosis not present

## 2023-12-12 DIAGNOSIS — M19032 Primary osteoarthritis, left wrist: Secondary | ICD-10-CM

## 2023-12-12 LAB — LIPID PANEL
Cholesterol: 172 mg/dL (ref 0–200)
HDL: 68.7 mg/dL (ref >39.00)
LDL Cholesterol: 73 mg/dL (ref 0–99)
NonHDL: 103.25
Total CHOL/HDL Ratio: 3
Triglycerides: 150 mg/dL — ABNORMAL HIGH (ref 0.0–149.0)
VLDL: 30 mg/dL (ref 0.0–40.0)

## 2023-12-12 LAB — HEMOGLOBIN A1C: Hgb A1c MFr Bld: 6.6 % — ABNORMAL HIGH (ref 4.6–6.5)

## 2023-12-12 LAB — IBC PANEL
Iron: 155 ug/dL (ref 42–165)
Saturation Ratios: 39.1 % (ref 20.0–50.0)
TIBC: 396.2 ug/dL (ref 250.0–450.0)
Transferrin: 283 mg/dL (ref 212.0–360.0)

## 2023-12-12 LAB — PSA: PSA: 0.13 ng/mL (ref 0.10–4.00)

## 2023-12-12 LAB — TSH: TSH: 1.88 u[IU]/mL (ref 0.35–5.50)

## 2023-12-12 LAB — VITAMIN D 25 HYDROXY (VIT D DEFICIENCY, FRACTURES): VITD: 24.86 ng/mL — ABNORMAL LOW (ref 30.00–100.00)

## 2023-12-12 LAB — FERRITIN: Ferritin: 130.4 ng/mL (ref 22.0–322.0)

## 2023-12-12 MED ORDER — CYANOCOBALAMIN 1000 MCG/ML IJ SOLN: 1000.0000 ug | Freq: Once | INTRAMUSCULAR | Status: AC

## 2023-12-12 NOTE — Assessment & Plan Note (Signed)
For B12 IM today,  to f/u any worsening symptoms or concerns

## 2023-12-12 NOTE — Assessment & Plan Note (Signed)
 Also for iron  and cbc with labs

## 2023-12-12 NOTE — Assessment & Plan Note (Signed)
 Last vitamin D  Lab Results  Component Value Date   VD25OH 24.86 (L) 12/12/2023   Low, to start oral replacement

## 2023-12-12 NOTE — Patient Instructions (Addendum)
 OK to try the OTC Voltaren  Gel (diclofenac ) for the left wrist arthritis   You had the flu shot today, and the B12 shot  Please continue all other medications as before, and refills have been done if requested.  Please have the pharmacy call with any other refills you may need.  Please keep your appointments with your specialists as you may have planned - hematology oncology  Please go to the LAB at the blood drawing area for the tests to be done  You will be contacted by phone if any changes need to be made immediately.  Otherwise, you will receive a letter about your results with an explanation, but please check with MyChart first.  Please make an Appointment to return in 6 months, or sooner if needed

## 2023-12-12 NOTE — Assessment & Plan Note (Signed)
 BP Readings from Last 3 Encounters:  12/12/23 124/80  09/13/23 (!) 134/94  09/13/23 107/75   Stable, pt to continue medical treatment norvasc  5 qd

## 2023-12-12 NOTE — Progress Notes (Signed)
 Patient ID: Nathan Wallace, male   DOB: 1950/12/16, 73 y.o.   MRN: [id number]        Chief Complaint: follow up left wrist arthritis, low b12, low vit d, tinnitus       HPI:  Nathan Wallace is a 73 y.o. male here with c/o 1 wk onset post left wrist area of bony enlargement and swelling without overlying skin change or cyst.  Pt denies chest pain, increased sob or doe, wheezing, orthopnea, PND, increased LE swelling, palpitations, dizziness or syncope.   Pt denies polydipsia, polyuria, or new focal neuro s/s.  Due for b12 shot today.  S/p penile implant June 2025, also with bladder surgury for incontinence. Also with bilateral tinnitus without HA, fever, dizziness.   Due for flu shot Wt Readings from Last 3 Encounters:  12/12/23 162 lb 3.2 oz (73.6 kg)  09/13/23 161 lb (73 kg)  09/13/23 161 lb 13.1 oz (73.4 kg)   BP Readings from Last 3 Encounters:  12/12/23 124/80  09/13/23 (!) 134/94  09/13/23 107/75         Past Medical History:  Diagnosis Date   AKI (acute kidney injury) (HCC)    Arthritis    RT KNEE AND RT SHOULDER   B12 deficiency    Benign head tremor    Chest tightness    comes and goes    CVA (cerebrovascular accident due to intracerebral hemorrhage) (HCC) 08/03/2011   Degenerative joint disease of knee, right    Depression 07/26/2012   no problems now   Diverticulosis    GERD (gastroesophageal reflux disease)    NO RECENT PROBLEMS AND NO MEDS   History of blood transfusion    History of kidney stones    History of urinary tract infection    Hyperlipidemia    border line   Hypertension    Impaired glucose tolerance 06/22/2011   Left inguinal hernia    Macrocytic anemia    MGUS (monoclonal gammopathy of unknown significance)    Prostate cancer (HCC) 2011   Rotator cuff tear arthropathy of right shoulder, with instability 10/25/2016   Sepsis (HCC)    Stress incontinence    Stroke [facility])    2013   Syncope 2012   after prostate surgery; neg neurology work up.     Tinnitus of right ear 12/08/2021   [place], non-pulsatile     Past Surgical History:  Procedure Laterality Date   COLONOSCOPY     INGUINAL HERNIA REPAIR  06/01/2011   Procedure: HERNIA REPAIR INGUINAL ADULT;  Surgeon: Elon CHRISTELLA Pacini, MD;  Location: [facility];  Service: General;  Laterality: Left;  Left Inguinal Hernia Repair with Mesh   PENILE PROSTHESIS IMPLANT N/A 09/13/2023   Procedure: INSERTION, PENILE PROSTHESIS;  Surgeon: Lovie Arlyss CROME, MD;  Location: [facility];  Service: Urology;  Laterality: N/A;   ROBOT ASSISTED LAPAROSCOPIC RADICAL PROSTATECTOMY  jan 2012   da vinci   SPINE SURGERY  2003 OR 2004   CERVICAL FUSION-STATES LIMITED ROM NECK   TOTAL SHOULDER ARTHROPLASTY Right 10/25/2016   TOTAL SHOULDER ARTHROPLASTY Right 10/25/2016   Procedure: RIGHT TOTAL SHOULDER ARTHROPLASTY;  Surgeon: Josefina Chew, MD;  Location: [facility];  Service: Orthopedics;  Laterality: Right;   URETHRAL SLING N/A 03/08/2016   Procedure: MALE SLING CYSTOSCOPY;  Surgeon: Glendia Elizabeth, MD;  Location: [facility];  Service: Urology;  Laterality: N/A;   URINARY SPHINCTER IMPLANT N/A 10/04/2022   Procedure: ARTIFICIAL URINARY SPHINCTER;  Surgeon: Lovie Arlyss CROME, MD;  Location: [facility];  Service: Urology;  Laterality: N/A;  120 MINUTES NEEDED FOR CASE   WRIST SURGERY     right     reports that he quit smoking about 57 years ago. His smoking use included cigarettes. He started smoking about 58 years ago. He has a 0.5 pack-year smoking history. He has never used smokeless tobacco. He reports current alcohol use. He reports that he does not currently use drugs after having used the following drugs: Marijuana. family history includes Aneurysm in his mother; Colon cancer (age of onset: 45) in his father; Heart attack in his brother; Hypertension in his mother. Allergies  Allergen Reactions   Nsaids Other (See Comments)    Gastric ulcer   Current Outpatient Medications on File Prior to Visit   Medication Sig Dispense Refill   acetaminophen  (TYLENOL ) 500 MG tablet Take 2 tablets (1,000 mg total) by mouth every 8 (eight) hours. 50 tablet 0   allopurinol  (ZYLOPRIM ) 300 MG tablet TAKE 1 TABLET BY MOUTH EVERY DAY 90 tablet 3   amLODipine  (NORVASC ) 5 MG tablet TAKE 1 TABLET (5 MG TOTAL) BY MOUTH DAILY. 90 tablet 3   Ascorbic Acid  (VITAMIN C  WITH ROSE HIPS) 1000 MG tablet Take 1 tablet (1,000 mg total) by mouth daily. (Patient taking differently: Take 2,000 mg by mouth daily.) 90 tablet 0   atorvastatin  (LIPITOR) 40 MG tablet TAKE 1 TABLET BY MOUTH EVERY DAY 90 tablet 3   Cholecalciferol  (VITAMIN D ) 50 MCG (2000 UT) tablet Take 4,000 Units by mouth daily.     Cyanocobalamin  (B-12 COMPLIANCE INJECTION IJ) Inject 1 Dose as directed every 30 (thirty) days.     cyclobenzaprine  (FLEXERIL ) 5 MG tablet Take 1 tablet (5 mg total) by mouth 3 (three) times daily as needed. 40 tablet 1   doxycycline  (VIBRA -TABS) 100 MG tablet Take 1 tablet (100 mg total) by mouth 2 (two) times daily. 20 tablet 0   gabapentin  (NEURONTIN ) 300 MG capsule TAKE 2 CAPSULES BY MOUTH 3 TIMES A DAY 90 capsule 5   HYDROcodone -acetaminophen  (NORCO/VICODIN) 5-325 MG tablet 1/2 - 1  tab by mouth every 6 hrs as needed for mod to severe pain 30 tablet 0   mirabegron ER (MYRBETRIQ) 25 MG TB24 tablet Take 25 mg by mouth daily.     oxyCODONE  (OXY IR/ROXICODONE ) 5 MG immediate release tablet Take 1 tablet (5 mg total) by mouth every 6 (six) hours as needed for severe pain (pain score 7-10). 20 tablet 0   pantoprazole  (PROTONIX ) 40 MG tablet TAKE 1 TABLET BY MOUTH EVERY DAY 30 tablet 0   PRESCRIPTION MEDICATION 1 Syringe by Intracavernosal route every other day as needed (10-30 minutes before you have sex for ED). Matrix (Papaverine 30mg  Phentolamine 1mg  Alprostadil 40mcg per ml injection)  erectile dysfunction injection every other day as needed for sex     sulfamethoxazole -trimethoprim  (BACTRIM  DS) 800-160 MG tablet Take 1 tablet by mouth  2 (two) times daily. 14 tablet 0   tiZANidine  (ZANAFLEX ) 4 MG tablet TAKE 1 TABLET BY MOUTH 3  TIMES DAILY AS NEEDED FOR  MUSCLE SPASM(S) 270 tablet 1   Current Facility-Administered Medications on File Prior to Visit  Medication Dose Route Frequency Provider Last Rate Last Admin   cyanocobalamin  ((VITAMIN B-12)) injection 1,000 mcg  1,000 mcg Intramuscular Q30 days Norleen Lynwood ORN, MD   1,000 mcg at 12/12/23 9077        ROS:  All others reviewed and negative.  Objective  PE:  BP 124/80   Pulse (!) 54   Temp 98 F (36.7 C)   Ht 5' 10 (1.778 m)   Wt 162 lb 3.2 oz (73.6 kg)   SpO2 99%   BMI 23.27 kg/m                 Constitutional: Pt appears in NAD               HENT: Head: NCAT.                Right Ear: External ear normal.                 Left Ear: External ear normal.                Eyes: . Pupils are equal, round, and reactive to light. Conjunctivae and EOM are normal               Nose: without d/c or deformity               Neck: Neck supple. Gross normal ROM               Cardiovascular: Normal rate and regular rhythm.                 Pulmonary/Chest: Effort normal and breath sounds without rales or wheezing.                Abd:  Soft, NT, ND, + BS, no organomegaly               Neurological: Pt is alert. At baseline orientation, motor grossly intact               Skin: Skin is warm. No rashes, no other new lesions, LE edema - none               Left post wrist with bony enlargement tender swelling localized more to lateral aspect               Psychiatric: Pt behavior is normal without agitation   Micro: none  Cardiac tracings I have personally interpreted today:  none  Pertinent Radiological findings (summarize): none   Lab Results  Component Value Date   WBC 5.9 01/16/2023   HGB 14.4 01/16/2023   HCT 41.1 01/16/2023   PLT 278 01/16/2023   GLUCOSE 112 (H) 01/16/2023   CHOL 172 12/12/2023   TRIG 150.0 (H) 12/12/2023   HDL 68.70 12/12/2023   LDLDIRECT  87.9 12/19/2011   LDLCALC 73 12/12/2023   ALT 28 01/16/2023   AST 23 01/16/2023   NA 141 01/16/2023   K 3.9 01/16/2023   CL 106 01/16/2023   CREATININE 1.02 01/16/2023   BUN 11 01/16/2023   CO2 28 01/16/2023   TSH 1.88 12/12/2023   PSA 0.13 12/12/2023   INR 1.12 03/02/2016   HGBA1C 6.6 (H) 12/12/2023   Assessment/Plan:  Nathan Wallace is a 73 y.o. Black or African American [2] male with  has a past medical history of AKI (acute kidney injury) (HCC), Arthritis, B12 deficiency, Benign head tremor, Chest tightness, CVA (cerebrovascular accident due to intracerebral hemorrhage) (HCC) (08/03/2011), Degenerative joint disease of knee, right, Depression (07/26/2012), Diverticulosis, GERD (gastroesophageal reflux disease), History of blood transfusion, History of kidney stones, History of urinary tract infection, Hyperlipidemia, Hypertension, Impaired glucose tolerance (06/22/2011), Left inguinal hernia, Macrocytic anemia, MGUS (monoclonal gammopathy of unknown significance), Prostate cancer (HCC) (2011), Rotator cuff tear arthropathy of right  shoulder, with instability (10/25/2016), Sepsis (HCC), Stress incontinence, Stroke [facility]), Syncope (2012), and Tinnitus of right ear (12/08/2021).  B12 deficiency For B12 IM today,  to f/u any worsening symptoms or concerns  HTN (hypertension) BP Readings from Last 3 Encounters:  12/12/23 124/80  09/13/23 (!) 134/94  09/13/23 107/75   Stable, pt to continue medical treatment norvasc  5 qd   Hyperlipidemia Lab Results  Component Value Date   LDLCALC 73 12/12/2023   Stable, pt to continue current statin lipitor 40 mg qd   Impaired glucose tolerance Lab Results  Component Value Date   HGBA1C 6.6 (H) 12/12/2023   Stable, pt to continue current medical treatment  - diet, wt control   Iron  deficiency Also for iron  and cbc with labs  Vitamin D  deficiency Last vitamin D  Lab Results  Component Value Date   VD25OH 24.86 (L) 12/12/2023   Low,  to start oral replacement   Wrist arthritis With mild flare uncontrolled, for otc voltaren  gel prn  Followup: Return in about 6 months (around 06/10/2024).  Lynwood Rush, MD 12/12/2023 8:57 PM [facility]

## 2023-12-12 NOTE — Assessment & Plan Note (Signed)
 Lab Results  Component Value Date   LDLCALC 73 12/12/2023   Stable, pt to continue current statin lipitor 40 mg qd

## 2023-12-12 NOTE — Assessment & Plan Note (Signed)
 Lab Results  Component Value Date   HGBA1C 6.6 (H) 12/12/2023   Stable, pt to continue current medical treatment  - diet, wt control

## 2023-12-12 NOTE — Assessment & Plan Note (Signed)
 With mild flare uncontrolled, for otc voltaren  gel prn

## 2023-12-13 ENCOUNTER — Ambulatory Visit: Payer: Self-pay | Admitting: Internal Medicine

## 2024-01-05 ENCOUNTER — Ambulatory Visit (INDEPENDENT_AMBULATORY_CARE_PROVIDER_SITE_OTHER)

## 2024-01-05 VITALS — Ht 70.0 in | Wt 162.0 lb

## 2024-01-05 DIAGNOSIS — Z0000 Encounter for general adult medical examination without abnormal findings: Secondary | ICD-10-CM | POA: Diagnosis not present

## 2024-01-05 DIAGNOSIS — H9313 Tinnitus, bilateral: Secondary | ICD-10-CM

## 2024-01-05 NOTE — Patient Instructions (Addendum)
 Nathan Wallace,  Thank you for taking the time for your Medicare Wellness Visit. I appreciate your continued commitment to your health goals. Please review the care plan we discussed, and feel free to reach out if I can assist you further.  Medicare recommends these wellness visits once per year to help you and your care team stay ahead of potential health issues. These visits are designed to focus on prevention, allowing your provider to concentrate on managing your acute and chronic conditions during your regular appointments.  Please note that Annual Wellness Visits do not include a physical exam. Some assessments may be limited, especially if the visit was conducted virtually. If needed, we may recommend a separate in-person follow-up with your provider.  Ongoing Care Seeing your primary care provider every 3 to 6 months helps us  monitor your health and provide consistent, personalized care. Last office visit on 9/02/205.  Next office visit on 01/10/2024.  You are due for a Tetanus vaccine and can get that done at your local pharmacy.  Remember to bring up a hearing test during your office visit if you have not heard anything before then.  Referrals If a referral was made during today's visit and you haven't received any updates within two weeks, please contact the referred provider directly to check on the status.  Recommended Screenings:  Health Maintenance  Topic Date Due   Medicare Annual Wellness Visit  01/03/2024   Pneumococcal Vaccine for age over 83  Completed   Flu Shot  Completed   Hepatitis C Screening  Completed   Zoster (Shingles) Vaccine  Completed   HPV Vaccine  Aged Out   Meningitis B Vaccine  Aged Out   DTaP/Tdap/Td vaccine  Discontinued   Colon Cancer Screening  Discontinued   COVID-19 Vaccine  Discontinued       01/05/2024    8:53 AM  Advanced Directives  Does Patient Have a Medical Advance Directive? No   Advance Care Planning is important because it: Ensures  you receive medical care that aligns with your values, goals, and preferences. Provides guidance to your family and loved ones, reducing the emotional burden of decision-making during critical moments.  Vision: Annual vision screenings are recommended for early detection of glaucoma, cataracts, and diabetic retinopathy. These exams can also reveal signs of chronic conditions such as diabetes and high blood pressure.  Dental: Annual dental screenings help detect early signs of oral cancer, gum disease, and other conditions linked to overall health, including heart disease and diabetes.  Please see the attached documents for additional preventive care recommendations.

## 2024-01-05 NOTE — Progress Notes (Signed)
 Subjective:   Nathan Wallace is a 73 y.o. who presents for a Medicare Wellness preventive visit.  As a reminder, Annual Wellness Visits don't include a physical exam, and some assessments may be limited, especially if this visit is performed virtually. We may recommend an in-person follow-up visit with your provider if needed.  Visit Complete: Virtual I connected with  [name] on 01/05/24 by a audio enabled telemedicine application and verified that I am speaking with the correct person using two identifiers.  Patient Location: Home  Provider Location: Office/Clinic  I discussed the limitations of evaluation and management by telemedicine. The patient expressed understanding and agreed to proceed.  Vital Signs: Because this visit was a virtual/telehealth visit, some criteria may be missing or patient reported. Any vitals not documented were not able to be obtained and vitals that have been documented are patient reported.  VideoDeclined- This patient declined Librarian, academic. Therefore the visit was completed with audio only.  Persons Participating in Visit: Patient.  AWV Questionnaire: No: Patient Medicare AWV questionnaire was not completed prior to this visit.  Cardiac Risk Factors include: advanced age (>99men, >9 women);male gender;dyslipidemia;hypertension     Objective:    Today's Vitals   01/05/24 0830  Weight: 162 lb (73.5 kg)  Height: 5' 10 (1.778 m)   Body mass index is 23.24 kg/m.     01/05/2024    8:53 AM 09/13/2023    4:06 PM 09/05/2023    3:48 PM 01/03/2023   10:34 AM 10/04/2022   12:03 PM 10/04/2022    5:49 AM 09/26/2022    8:47 AM  Advanced Directives  Does Patient Have a Medical Advance Directive? No No No No No No No  Would patient like information on creating a medical advance directive?  No - Patient declined  No - Patient declined No - Patient declined No - Patient declined No - Patient declined    Current  Medications (verified) Outpatient Encounter Medications as of 01/05/2024  Medication Sig   acetaminophen  (TYLENOL ) 500 MG tablet Take 2 tablets (1,000 mg total) by mouth every 8 (eight) hours.   allopurinol  (ZYLOPRIM ) 300 MG tablet TAKE 1 TABLET BY MOUTH EVERY DAY   amLODipine  (NORVASC ) 5 MG tablet TAKE 1 TABLET (5 MG TOTAL) BY MOUTH DAILY.   Ascorbic Acid  (VITAMIN C  WITH ROSE HIPS) 1000 MG tablet Take 1 tablet (1,000 mg total) by mouth daily. (Patient taking differently: Take 2,000 mg by mouth daily.)   atorvastatin  (LIPITOR) 40 MG tablet TAKE 1 TABLET BY MOUTH EVERY DAY   Cholecalciferol  (VITAMIN D ) 50 MCG (2000 UT) tablet Take 4,000 Units by mouth daily.   Cyanocobalamin  (B-12 COMPLIANCE INJECTION IJ) Inject 1 Dose as directed every 30 (thirty) days.   cyclobenzaprine  (FLEXERIL ) 5 MG tablet Take 1 tablet (5 mg total) by mouth 3 (three) times daily as needed.   doxycycline  (VIBRA -TABS) 100 MG tablet Take 1 tablet (100 mg total) by mouth 2 (two) times daily.   gabapentin  (NEURONTIN ) 300 MG capsule TAKE 2 CAPSULES BY MOUTH 3 TIMES A DAY   HYDROcodone -acetaminophen  (NORCO/VICODIN) 5-325 MG tablet 1/2 - 1  tab by mouth every 6 hrs as needed for mod to severe pain   mirabegron ER (MYRBETRIQ) 25 MG TB24 tablet Take 25 mg by mouth daily.   oxyCODONE  (OXY IR/ROXICODONE ) 5 MG immediate release tablet Take 1 tablet (5 mg total) by mouth every 6 (six) hours as needed for severe pain (pain score 7-10).   pantoprazole  (PROTONIX ) 40  MG tablet TAKE 1 TABLET BY MOUTH EVERY DAY   PRESCRIPTION MEDICATION 1 Syringe by Intracavernosal route every other day as needed (10-30 minutes before you have sex for ED). Matrix (Papaverine 30mg  Phentolamine 1mg  Alprostadil 40mcg per ml injection)  erectile dysfunction injection every other day as needed for sex   sulfamethoxazole -trimethoprim  (BACTRIM  DS) 800-160 MG tablet Take 1 tablet by mouth 2 (two) times daily.   tiZANidine  (ZANAFLEX ) 4 MG tablet TAKE 1 TABLET BY MOUTH 3   TIMES DAILY AS NEEDED FOR  MUSCLE SPASM(S)   Facility-Administered Encounter Medications as of 01/05/2024  Medication   cyanocobalamin  ((VITAMIN B-12)) injection 1,000 mcg   cyanocobalamin  (VITAMIN B12) injection 1,000 mcg    Allergies (verified) Nsaids   History: Past Medical History:  Diagnosis Date   AKI (acute kidney injury)    Arthritis    RT KNEE AND RT SHOULDER   B12 deficiency    Benign head tremor    Chest tightness    comes and goes    CVA (cerebrovascular accident due to intracerebral hemorrhage) (HCC) 08/03/2011   Degenerative joint disease of knee, right    Depression 07/26/2012   no problems now   Diverticulosis    GERD (gastroesophageal reflux disease)    NO RECENT PROBLEMS AND NO MEDS   History of blood transfusion    History of kidney stones    History of urinary tract infection    Hyperlipidemia    border line   Hypertension    Impaired glucose tolerance 06/22/2011   Left inguinal hernia    Macrocytic anemia    MGUS (monoclonal gammopathy of unknown significance)    Prostate cancer (HCC) 2011   Rotator cuff tear arthropathy of right shoulder, with instability 10/25/2016   Sepsis (HCC)    Stress incontinence    Stroke [facility])    2013   Syncope 2012   after prostate surgery; neg neurology work up.    Tinnitus of right ear 12/08/2021   Humming, non-pulsatile     Past Surgical History:  Procedure Laterality Date   COLONOSCOPY     INGUINAL HERNIA REPAIR  06/01/2011   Procedure: HERNIA REPAIR INGUINAL ADULT;  Surgeon: Elon CHRISTELLA Pacini, MD;  Location: [facility];  Service: General;  Laterality: Left;  Left Inguinal Hernia Repair with Mesh   PENILE PROSTHESIS IMPLANT N/A 09/13/2023   Procedure: INSERTION, PENILE PROSTHESIS;  Surgeon: Lovie Arlyss CROME, MD;  Location: [facility];  Service: Urology;  Laterality: N/A;   ROBOT ASSISTED LAPAROSCOPIC RADICAL PROSTATECTOMY  jan 2012   da vinci   SPINE SURGERY  2003 OR 2004   CERVICAL FUSION-STATES  LIMITED ROM NECK   TOTAL SHOULDER ARTHROPLASTY Right 10/25/2016   TOTAL SHOULDER ARTHROPLASTY Right 10/25/2016   Procedure: RIGHT TOTAL SHOULDER ARTHROPLASTY;  Surgeon: Josefina Chew, MD;  Location: [facility];  Service: Orthopedics;  Laterality: Right;   URETHRAL SLING N/A 03/08/2016   Procedure: MALE SLING CYSTOSCOPY;  Surgeon: Glendia Elizabeth, MD;  Location: [facility];  Service: Urology;  Laterality: N/A;   URINARY SPHINCTER IMPLANT N/A 10/04/2022   Procedure: ARTIFICIAL URINARY SPHINCTER;  Surgeon: Lovie Arlyss CROME, MD;  Location: [facility];  Service: Urology;  Laterality: N/A;  120 MINUTES NEEDED FOR CASE   WRIST SURGERY     right    Family History  Problem Relation Age of Onset   Hypertension Mother    Aneurysm Mother        brain   Colon cancer Father 63   Heart  attack Brother    Social History   Socioeconomic History   Marital status: Married    Spouse name: Ellouise   Number of children: 3   Years of education: Not on file   Highest education level: Not on file  Occupational History    Employer: RETIRED    Comment: retired Acupuncturist.    Occupation: business Patent attorney and remodeling  Tobacco Use   Smoking status: Former    Current packs/day: 0.00    Average packs/day: 0.5 packs/day for 1 year (0.5 ttl pk-yrs)    Types: Cigarettes    Start date: 06/27/1965    Quit date: 06/28/1966    Years since quitting: 57.5   Smokeless tobacco: Never  Vaping Use   Vaping status: Never Used  Substance and Sexual Activity   Alcohol use: Yes    Comment: 3 beers daily; liquor 2 glasses every other day    Drug use: Not Currently    Types: Marijuana    Comment: Has not used in years   Sexual activity: Not Currently  Other Topics Concern   Not on file  Social History Narrative   Lives with wife and their granddaughter/2025   Social Drivers of Health   Financial Resource Strain: Low Risk  (01/05/2024)   Overall Financial Resource Strain (CARDIA)    Difficulty of Paying  Living Expenses: Not hard at all  Food Insecurity: No Food Insecurity (01/05/2024)   Hunger Vital Sign    Worried About Running Out of Food in the Last Year: Never true    Ran Out of Food in the Last Year: Never true  Transportation Needs: No Transportation Needs (01/05/2024)   PRAPARE - Administrator, Civil Service (Medical): No    Lack of Transportation (Non-Medical): No  Physical Activity: Sufficiently Active (01/03/2023)   Exercise Vital Sign    Days of Exercise per Week: 4 days    Minutes of Exercise per Session: 40 min  Stress: No Stress Concern Present (01/05/2024)   [employer] of Occupational Health - Occupational Stress Questionnaire    Feeling of Stress: Not at all  Social Connections: Socially Integrated (01/05/2024)   Social Connection and Isolation Panel    Frequency of Communication with Friends and Family: More than three times a week    Frequency of Social Gatherings with Friends and Family: Three times a week    Attends Religious Services: More than 4 times per year    Active Member of Clubs or Organizations: Yes    Attends Engineer, structural: More than 4 times per year    Marital Status: Married    Tobacco Counseling Counseling given: Not Answered    Clinical Intake:  Pre-visit preparation completed: Yes  Pain : No/denies pain     BMI - recorded: 23.24 Nutritional Status: BMI of 19-24  Normal Diabetes: No  Lab Results  Component Value Date   HGBA1C 6.6 (H) 12/12/2023   HGBA1C 6.3 05/26/2022   HGBA1C 6.3 05/19/2021     How often do you need to have someone help you when you read instructions, pamphlets, or other written materials from your doctor or pharmacy?: 1 - Never  Interpreter Needed?: No  Information entered by :: Matthews Franks, RMA   Activities of Daily Living     01/05/2024    8:32 AM 09/13/2023    7:54 AM  In your present state of health, do you have any difficulty performing the following activities:   Hearing? 1  0  Comment Ringing in his ear-per pt   Vision? 0 0  Difficulty concentrating or making decisions? 0 0  Walking or climbing stairs? 0   Dressing or bathing? 0   Doing errands, shopping? 0   Preparing Food and eating ? N   Using the Toilet? N   In the past six months, have you accidently leaked urine? Y   Do you have problems with loss of bowel control? N   Managing your Medications? N   Managing your Finances? N   Housekeeping or managing your Housekeeping? N     Patient Care Team: Norleen Lynwood ORN, MD as PCP - General Cyrena Donnice DEL, MD as Attending Physician (Neurology) Louis Shove, MD (Neurosurgery) Lonn Hicks, MD as Consulting Physician (Hematology and Oncology)  I have updated your Care Teams any recent Medical Services you may have received from other providers in the past year.     Assessment:   This is a routine wellness examination for Nathan Wallace.  Hearing/Vision screen Hearing Screening - Comments:: Ringing in his ear-per pt Vision Screening - Comments:: Wears eyeglasses/[facility]   Goals Addressed             This Visit's Progress    Stay physically active   On track      Depression Screen     01/05/2024    8:58 AM 01/03/2023   10:31 AM 05/31/2022    9:16 AM 12/24/2021    4:29 PM 05/27/2021    9:09 AM 05/27/2021    8:51 AM 05/21/2020    9:25 AM  PHQ 2/9 Scores  PHQ - 2 Score 0 0 0 0 1 1 1   PHQ- 9 Score 0 0 0        Fall Risk     01/05/2024    8:56 AM 12/12/2023    8:35 AM 01/03/2023   10:28 AM 05/31/2022    9:16 AM 12/24/2021    4:18 PM  Fall Risk   Falls in the past year? 1 1 0 0 0  Number falls in past yr: 0 0 0 0 0  Injury with Fall? 0 0 0 0 0  Risk for fall due to :  No Fall Risks  No Fall Risks No Fall Risks  Follow up Falls evaluation completed;Falls prevention discussed Falls evaluation completed Falls evaluation completed;Education provided;Falls prevention discussed Falls evaluation completed Falls prevention discussed       Data saved with a previous flowsheet row definition    MEDICARE RISK AT HOME:  Medicare Risk at Home Any stairs in or around the home?: No If so, are there any without handrails?: No Home free of loose throw rugs in walkways, pet beds, electrical cords, etc?: Yes Adequate lighting in your home to reduce risk of falls?: Yes Life alert?: No Use of a cane, walker or w/c?: No Grab bars in the bathroom?: No Shower chair or bench in shower?: No Elevated toilet seat or a handicapped toilet?: No  TIMED UP AND GO:  Was the test performed?  No  Cognitive Function: Declined/Normal: No cognitive concerns noted by patient or family. Patient alert, oriented, able to answer questions appropriately and recall recent events. No signs of memory loss or confusion.        01/03/2023   10:32 AM 12/24/2021    4:33 PM  6CIT Screen  What Year? 0 points 0 points  What month? 0 points 0 points  What time? 0 points 0 points  Count back from  20 0 points 0 points  Months in reverse 0 points 0 points  Repeat phrase 0 points 0 points  Total Score 0 points 0 points    Immunizations Immunization History  Administered Date(s) Administered   Fluad Quad(high Dose 65+) 12/06/2018, 01/06/2020, 01/21/2021, 05/19/2022   Fluad Trivalent(High Dose 65+) 12/23/2022   INFLUENZA, HIGH DOSE SEASONAL PF 12/25/2015, 02/02/2017, 01/05/2018, 12/12/2023   Influenza Split 12/19/2011   Influenza,inj,Quad PF,6+ Mos 02/28/2013, 04/09/2014, 01/05/2015   PFIZER Comirnaty(Gray Top)Covid-19 Tri-Sucrose Vaccine 07/30/2020   PFIZER(Purple Top)SARS-COV-2 Vaccination 05/24/2019, 06/18/2019, 01/21/2020   Pfizer Covid-19 Vaccine Bivalent Booster 42yrs & up 03/03/2021   Pneumococcal Conjugate-13 05/18/2017   Pneumococcal Polysaccharide-23 05/22/2018   Td 08/26/2008   Tdap 02/23/2012   Unspecified SARS-COV-2 Vaccination 02/02/2022   Zoster Recombinant(Shingrix ) 12/15/2021, 05/31/2022   Zoster, Live 07/26/2012    Screening  Tests Health Maintenance  Topic Date Due   Medicare Annual Wellness (AWV)  01/03/2024   Pneumococcal Vaccine: 50+ Years  Completed   Influenza Vaccine  Completed   Hepatitis C Screening  Completed   Zoster Vaccines- Shingrix   Completed   HPV VACCINES  Aged Out   Meningococcal B Vaccine  Aged Out   DTaP/Tdap/Td  Discontinued   Colonoscopy  Discontinued   COVID-19 Vaccine  Discontinued    Health Maintenance Items Addressed: See Nurse Notes at the end of this note  Additional Screening:  Vision Screening: Recommended annual ophthalmology exams for early detection of glaucoma and other disorders of the eye. Is the patient up to date with their annual eye exam?  No  Who is the provider or what is the name of the office in which the patient attends annual eye exams? [facility]  Dental Screening: Recommended annual dental exams for proper oral Nurse, mental health Referral / Chronic Care Management: CRR required this visit?  No   CCM required this visit?  No   Plan:    I have personally reviewed and noted the following in the patient's chart:   Medical and social history Use of alcohol, tobacco or illicit drugs  Current medications and supplements including opioid prescriptions. Patient is currently taking opioid prescriptions. Information provided to patient regarding non-opioid alternatives. Patient advised to discuss non-opioid treatment plan with their provider. Functional ability and status Nutritional status Physical activity Advanced directives List of other physicians Hospitalizations, surgeries, and ER visits in previous 12 months Vitals Screenings to include cognitive, depression, and falls Referrals and appointments  In addition, I have reviewed and discussed with patient certain preventive protocols, quality metrics, and best practice recommendations. A written personalized care plan for preventive services as well as general preventive health  recommendations were provided to patient.   Kasim Mccorkle L Chantal Worthey, CMA   01/05/2024   After Visit Summary: (MyChart) Due to this being a telephonic visit, the after visit summary with patients personalized plan was offered to patient via MyChart   Notes: Patient is due a Tdap.  He has a concern about ringing in both ears for some time now.  Patient stated that he has brought that up to provider during one of his office visits.  Patient is requesting a hearing screening and order has been placed today.  He had no other concerns to address today.

## 2024-01-10 ENCOUNTER — Ambulatory Visit (INDEPENDENT_AMBULATORY_CARE_PROVIDER_SITE_OTHER)

## 2024-01-10 DIAGNOSIS — E538 Deficiency of other specified B group vitamins: Secondary | ICD-10-CM | POA: Diagnosis not present

## 2024-01-10 MED ORDER — CYANOCOBALAMIN 1000 MCG/ML IJ SOLN
1000.0000 ug | Freq: Once | INTRAMUSCULAR | Status: AC
  Administered 2024-01-10: 1000 ug via INTRAMUSCULAR

## 2024-01-10 NOTE — Progress Notes (Signed)
 Pt here for monthly B12 injection per original order dated:   Last B12 injection:12/12/2023  Last B12 level: 05/26/2022    B12 1000mcg given IM, right deltoid and pt tolerated injection well.  Next B12 injection scheduled for: 02/12/2024 @ 8:40 AM

## 2024-01-13 ENCOUNTER — Other Ambulatory Visit: Payer: Self-pay | Admitting: Internal Medicine

## 2024-01-15 ENCOUNTER — Other Ambulatory Visit: Payer: Self-pay

## 2024-02-12 ENCOUNTER — Inpatient Hospital Stay: Payer: Medicare HMO | Attending: Hematology and Oncology

## 2024-02-12 ENCOUNTER — Ambulatory Visit (INDEPENDENT_AMBULATORY_CARE_PROVIDER_SITE_OTHER)

## 2024-02-12 DIAGNOSIS — E538 Deficiency of other specified B group vitamins: Secondary | ICD-10-CM | POA: Insufficient documentation

## 2024-02-12 DIAGNOSIS — D472 Monoclonal gammopathy: Secondary | ICD-10-CM | POA: Insufficient documentation

## 2024-02-12 LAB — CMP (CANCER CENTER ONLY)
ALT: 22 U/L (ref 0–44)
AST: 22 U/L (ref 15–41)
Albumin: 4.5 g/dL (ref 3.5–5.0)
Alkaline Phosphatase: 69 U/L (ref 38–126)
Anion gap: 5 (ref 5–15)
BUN: 17 mg/dL (ref 8–23)
CO2: 29 mmol/L (ref 22–32)
Calcium: 9.6 mg/dL (ref 8.9–10.3)
Chloride: 106 mmol/L (ref 98–111)
Creatinine: 1.06 mg/dL (ref 0.61–1.24)
GFR, Estimated: >60 mL/min (ref >60)
Glucose, Bld: 112 mg/dL — ABNORMAL HIGH (ref 70–99)
Potassium: 4 mmol/L (ref 3.5–5.1)
Sodium: 140 mmol/L (ref 135–145)
Total Bilirubin: 0.8 mg/dL (ref 0.0–1.2)
Total Protein: 7.9 g/dL (ref 6.5–8.1)

## 2024-02-12 LAB — CBC WITH DIFFERENTIAL (CANCER CENTER ONLY)
Abs Immature Granulocytes: 0.01 K/uL (ref 0.00–0.07)
Basophils Absolute: 0 K/uL (ref 0.0–0.1)
Basophils Relative: 1 %
Eosinophils Absolute: 0.2 K/uL (ref 0.0–0.5)
Eosinophils Relative: 4 %
HCT: 39.3 % (ref 39.0–52.0)
Hemoglobin: 13.7 g/dL (ref 13.0–17.0)
Immature Granulocytes: 0 %
Lymphocytes Relative: 32 %
Lymphs Abs: 1.7 K/uL (ref 0.7–4.0)
MCH: 33.7 pg (ref 26.0–34.0)
MCHC: 34.9 g/dL (ref 30.0–36.0)
MCV: 96.8 fL (ref 80.0–100.0)
Monocytes Absolute: 0.4 K/uL (ref 0.1–1.0)
Monocytes Relative: 8 %
Neutro Abs: 3.1 K/uL (ref 1.7–7.7)
Neutrophils Relative %: 55 %
Platelet Count: 252 K/uL (ref 150–400)
RBC: 4.06 MIL/uL — ABNORMAL LOW (ref 4.22–5.81)
RDW: 13.1 % (ref 11.5–15.5)
WBC Count: 5.5 K/uL (ref 4.0–10.5)
nRBC: 0 % (ref 0.0–0.2)

## 2024-02-12 MED ORDER — CYANOCOBALAMIN 1000 MCG/ML IJ SOLN
1000.0000 ug | Freq: Once | INTRAMUSCULAR | Status: AC
  Administered 2024-02-12: 1000 ug via INTRAMUSCULAR

## 2024-02-12 NOTE — Progress Notes (Signed)
 After obtaining consent, and per orders of Dr. Jonny Ruiz, injection of B12 given by Ferdie Ping. Patient instructed to report any adverse reaction to me immediately.

## 2024-02-13 LAB — KAPPA/LAMBDA LIGHT CHAINS
Kappa free light chain: 35.4 mg/L — ABNORMAL HIGH (ref 3.3–19.4)
Kappa, lambda light chain ratio: 2.51 — ABNORMAL HIGH (ref 0.26–1.65)
Lambda free light chains: 14.1 mg/L (ref 5.7–26.3)

## 2024-02-14 ENCOUNTER — Other Ambulatory Visit: Payer: Self-pay | Admitting: Internal Medicine

## 2024-02-14 ENCOUNTER — Ambulatory Visit: Payer: Self-pay | Attending: Internal Medicine | Admitting: Audiologist

## 2024-02-14 DIAGNOSIS — H9319 Tinnitus, unspecified ear: Secondary | ICD-10-CM | POA: Diagnosis not present

## 2024-02-14 DIAGNOSIS — H903 Sensorineural hearing loss, bilateral: Secondary | ICD-10-CM | POA: Insufficient documentation

## 2024-02-14 NOTE — Telephone Encounter (Signed)
 Copied from CRM #[id number]. Topic: Clinical - Medication Refill >> Feb 14, 2024  4:39 PM Shereese L wrote: Medication: HYDROcodone -acetaminophen  (NORCO/VICODIN) 5-325 MG tablet  Has the patient contacted their pharmacy? Yes (Agent: If no, request that the patient contact the pharmacy for the refill. If patient does not wish to contact the pharmacy document the reason why and proceed with request.) (Agent: If yes, when and what did the pharmacy advise?)  This is the patient's preferred pharmacy:  [facility] [phone] [address], [place] - [address]  [place] [address] Phone: [phone] Fax: [phone]   Is this the correct pharmacy for this prescription? Yes If no, delete pharmacy and type the correct one.   Has the prescription been filled recently? Yes  Is the patient out of the medication? Yes  Has the patient been seen for an appointment in the last year OR does the patient have an upcoming appointment? Yes  Can we respond through MyChart? Yes  Agent: Please be advised that Rx refills may take up to 3 business days. We ask that you follow-up with your pharmacy.

## 2024-02-14 NOTE — Procedures (Signed)
  Outpatient Rehabilitation and [facility] [address] [place], [place] [address] [phone]  AUDIOLOGICAL EVALUATION  Name: Nathan Wallace    Status: Outpatient DOB: 07/31/1950    Referent: Norleen Lynwood ORN, MD MRN: [id number] Date: 02/14/2024     Diagnosis: Sensorineural hearing loss, bilateral   HISTORY: [name], 73 y.o., was seen for an audiological evaluation.   Deval notices increased tinnitus over the last several months. He reports that the tinnitus is louder in the right ear, maybe present in both ears. It is a constant hum. Dierre notes ear pain, pressure, fullness or dizziness.  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 There is no family history of hearing loss. He has a history of a 104ft fall on the job that injured his neck and right shoulder and wrist.  EVALUATION: Otoscopic inspection reveals clear ear canals with visible tympanic membranes.   Tympanometry was completed to assess middle ear status. A normal, Type A tympanogram was obtained for the left ear and a hypermobile Type Ad tympanogram was obtained for the right ear.  Standard audiometric techniques were used to obtain thresholds under high frequency headphones. Speech reception thresholds are 10 dBHL on the right and 5 dBHL on the left using recorded spondee word lists. Word recognition was 92% at 50 dBHL on the right and 100% at 50 dBHL on the left using recorded NU-6 word lists, in quiet. A mild high frequency sensorineural hearing loss is present bilaterally at 4000Hz  and above.   CONCLUSION:  [name] has a mild high frequency sensorineural hearing loss at 4000Hz  and above bilaterally.  Word recognition is good in quiet at average conversational speech levels bilaterally. Findings were reviewed with the patient.   RECOMMENDATIONS: Monitor hearing closely with a repeat audiological evaluation if there is any change in hearing. Assistance for  tinnitus and noise maskers were discussed. Use of binaural amplification to assist in daily listening situations.  A list of area hearing aid providers was given to the patient. Use of communication strategies to improve communication in difficult listening situations.  Audiogram scanned in under media tab. 40 minutes spent testing and counseling.  Delon EMERSON Baumgartner, Au.D., CCC-A Audiologist 02/14/2024  cc: Norleen Lynwood ORN, MD

## 2024-02-16 LAB — MULTIPLE MYELOMA PANEL, SERUM
Albumin SerPl Elph-Mcnc: 3.6 g/dL (ref 2.9–4.4)
Albumin/Glob SerPl: 1.1 (ref 0.7–1.7)
Alpha 1: 0.2 g/dL (ref 0.0–0.4)
Alpha2 Glob SerPl Elph-Mcnc: 0.5 g/dL (ref 0.4–1.0)
B-Globulin SerPl Elph-Mcnc: 1.3 g/dL (ref 0.7–1.3)
Gamma Glob SerPl Elph-Mcnc: 1.3 g/dL (ref 0.4–1.8)
Globulin, Total: 3.4 g/dL (ref 2.2–3.9)
IgA: 405 mg/dL (ref 61–437)
IgG (Immunoglobin G), Serum: 1426 mg/dL (ref 603–1613)
IgM (Immunoglobulin M), Srm: 25 mg/dL (ref 15–143)
M Protein SerPl Elph-Mcnc: 0.7 g/dL — ABNORMAL HIGH
Total Protein ELP: 7 g/dL (ref 6.0–8.5)

## 2024-02-19 MED ORDER — HYDROCODONE-ACETAMINOPHEN 5-325 MG PO TABS: ORAL_TABLET | ORAL | 0 refills | Status: AC

## 2024-02-20 ENCOUNTER — Encounter: Payer: Self-pay | Admitting: Hematology and Oncology

## 2024-02-20 ENCOUNTER — Inpatient Hospital Stay: Payer: Medicare HMO | Admitting: Hematology and Oncology

## 2024-02-20 VITALS — BP 141/82 | HR 62 | Temp 98.0°F | Resp 18 | Ht 70.0 in | Wt 163.2 lb

## 2024-02-20 DIAGNOSIS — D472 Monoclonal gammopathy: Secondary | ICD-10-CM | POA: Diagnosis not present

## 2024-02-20 DIAGNOSIS — E538 Deficiency of other specified B group vitamins: Secondary | ICD-10-CM | POA: Diagnosis not present

## 2024-02-20 NOTE — Assessment & Plan Note (Addendum)
 This is a pleasant gentleman who was diagnosed with monoclonal gammopathy of unknown significance in 2013. The patient has history of anemia. Subsequent evaluation found that the patient had B12 deficiency. He was placed on B12 replacement therapy.   Clinically, he has no signs of disease progression; we reviewed test results and he has no signs of anemia, hypercalcemia or renal failure Plan to see him next year in with repeat blood work, examination and blood work. We discussed natural history of MGUS

## 2024-02-20 NOTE — Progress Notes (Signed)
 [facility] OFFICE PROGRESS NOTE  Patient Care Team: Norleen Lynwood ORN, MD as PCP - General Cyrena Donnice DEL, MD as Attending Physician (Neurology) Louis Shove, MD (Neurosurgery) Lonn Hicks, MD as Consulting Physician (Hematology and Oncology)  ASSESSMENT & PLAN:  Assessment & Plan MGUS (monoclonal gammopathy of unknown significance) This is a pleasant gentleman who was diagnosed with monoclonal gammopathy of unknown significance in 2013. The patient has history of anemia. Subsequent evaluation found that the patient had B12 deficiency. He was placed on B12 replacement therapy.   Clinically, he has no signs of disease progression; we reviewed test results and he has no signs of anemia, hypercalcemia or renal failure Plan to see him next year in with repeat blood work, examination and blood work. We discussed natural history of MGUS  Orders Placed This Encounter  Procedures   CBC with Differential/Platelet    Standing Status:   Standing    Number of Occurrences:   22    Expiration Date:   02/19/2025   Comprehensive metabolic panel with GFR    Standing Status:   Standing    Number of Occurrences:   33    Expiration Date:   02/19/2025   Kappa/lambda light chains    Standing Status:   Standing    Number of Occurrences:   22    Expiration Date:   02/19/2025   Multiple Myeloma Panel (SPEP&IFE w/QIG)    Standing Status:   Standing    Number of Occurrences:   22    Expiration Date:   02/19/2025     INTERVAL HISTORY: he returns for surveillance follow-up for diagnosis of MGUS Patient denies recurrent infection or bone pain We reviewed recent CBC, CMP and myeloma panel results  PHYSICAL EXAMINATION: ECOG PERFORMANCE STATUS: 0 - Asymptomatic  Vitals:   02/20/24 0932  BP: (!) 141/82  Pulse: 62  Resp: 18  Temp: 98 F (36.7 C)  SpO2: 99%

## 2024-03-14 ENCOUNTER — Ambulatory Visit

## 2024-03-14 DIAGNOSIS — E538 Deficiency of other specified B group vitamins: Secondary | ICD-10-CM

## 2024-03-14 MED ORDER — CYANOCOBALAMIN 1000 MCG/ML IJ SOLN: 1000.0000 ug | Freq: Once | INTRAMUSCULAR | Status: AC

## 2024-03-14 NOTE — Progress Notes (Signed)
Pt was given B12 injection w/o any complications. 

## 2024-04-15 ENCOUNTER — Ambulatory Visit (INDEPENDENT_AMBULATORY_CARE_PROVIDER_SITE_OTHER)

## 2024-04-15 DIAGNOSIS — E538 Deficiency of other specified B group vitamins: Secondary | ICD-10-CM

## 2024-04-15 MED ORDER — CYANOCOBALAMIN 1000 MCG/ML IJ SOLN
1000.0000 ug | Freq: Once | INTRAMUSCULAR | Status: AC
  Administered 2024-04-15: 1000 ug via INTRAMUSCULAR

## 2024-04-15 NOTE — Progress Notes (Signed)
 After obtaining consent, and per orders of Dr. Jonny Ruiz, injection of B12 given by Ferdie Ping. Patient instructed to report any adverse reaction to me immediately.

## 2024-05-13 ENCOUNTER — Other Ambulatory Visit: Payer: Self-pay | Admitting: Internal Medicine

## 2024-05-14 ENCOUNTER — Other Ambulatory Visit: Payer: Self-pay

## 2024-05-16 ENCOUNTER — Ambulatory Visit

## 2024-05-16 DIAGNOSIS — E538 Deficiency of other specified B group vitamins: Secondary | ICD-10-CM

## 2024-05-16 MED ORDER — CYANOCOBALAMIN 1000 MCG/ML IJ SOLN
1000.0000 ug | Freq: Once | INTRAMUSCULAR | Status: AC
  Administered 2024-05-16: 1000 ug via INTRAMUSCULAR

## 2024-05-16 NOTE — Progress Notes (Signed)
Pt was given B12 injection with no complications.

## 2024-06-03 ENCOUNTER — Encounter: Admitting: Family Medicine

## 2024-06-10 ENCOUNTER — Ambulatory Visit: Admitting: Internal Medicine

## 2024-06-14 ENCOUNTER — Ambulatory Visit

## 2025-02-10 ENCOUNTER — Inpatient Hospital Stay

## 2025-02-20 ENCOUNTER — Inpatient Hospital Stay: Admitting: Hematology and Oncology
